# Patient Record
Sex: Male | Born: 1935 | Race: Black or African American | Hispanic: No | Marital: Married | Smoking: Never smoker
Health system: Southern US, Community
[De-identification: ages and names within clinical notes are randomized; demographics above are authoritative.]

## PROBLEM LIST (undated history)

## (undated) DIAGNOSIS — H269 Unspecified cataract: Secondary | ICD-10-CM

## (undated) DIAGNOSIS — K219 Gastro-esophageal reflux disease without esophagitis: Secondary | ICD-10-CM

## (undated) DIAGNOSIS — Z950 Presence of cardiac pacemaker: Secondary | ICD-10-CM

## (undated) DIAGNOSIS — R55 Syncope and collapse: Principal | ICD-10-CM

## (undated) DIAGNOSIS — H509 Unspecified strabismus: Secondary | ICD-10-CM

## (undated) DIAGNOSIS — M1990 Unspecified osteoarthritis, unspecified site: Secondary | ICD-10-CM

## (undated) DIAGNOSIS — I429 Cardiomyopathy, unspecified: Secondary | ICD-10-CM

## (undated) DIAGNOSIS — I10 Essential (primary) hypertension: Secondary | ICD-10-CM

## (undated) DIAGNOSIS — R001 Bradycardia, unspecified: Secondary | ICD-10-CM

## (undated) DIAGNOSIS — N400 Enlarged prostate without lower urinary tract symptoms: Secondary | ICD-10-CM

## (undated) DIAGNOSIS — I499 Cardiac arrhythmia, unspecified: Secondary | ICD-10-CM

## (undated) DIAGNOSIS — I8390 Asymptomatic varicose veins of unspecified lower extremity: Secondary | ICD-10-CM

## (undated) DIAGNOSIS — R7611 Nonspecific reaction to tuberculin skin test without active tuberculosis: Secondary | ICD-10-CM

## (undated) DIAGNOSIS — N189 Chronic kidney disease, unspecified: Secondary | ICD-10-CM

## (undated) HISTORY — PX: CATARACT EXTRACTION W/ INTRAOCULAR LENS IMPLANT: SHX1309

## (undated) HISTORY — PX: TONSILLECTOMY: SUR1361

---

## 1998-08-02 ENCOUNTER — Inpatient Hospital Stay (HOSPITAL_COMMUNITY): Admission: EM | Admit: 1998-08-02 | Discharge: 1998-08-03 | Payer: Self-pay | Admitting: Emergency Medicine

## 1998-08-02 ENCOUNTER — Encounter: Payer: Self-pay | Admitting: Emergency Medicine

## 1999-05-29 ENCOUNTER — Encounter: Payer: Self-pay | Admitting: Urology

## 1999-05-29 ENCOUNTER — Encounter: Admission: RE | Admit: 1999-05-29 | Discharge: 1999-05-29 | Payer: Self-pay | Admitting: Urology

## 1999-06-01 ENCOUNTER — Encounter: Payer: Self-pay | Admitting: Urology

## 1999-06-01 ENCOUNTER — Encounter: Admission: RE | Admit: 1999-06-01 | Discharge: 1999-06-01 | Payer: Self-pay | Admitting: Urology

## 2002-06-17 ENCOUNTER — Ambulatory Visit (HOSPITAL_COMMUNITY): Admission: RE | Admit: 2002-06-17 | Discharge: 2002-06-17 | Payer: Self-pay | Admitting: Gastroenterology

## 2002-06-17 ENCOUNTER — Encounter (INDEPENDENT_AMBULATORY_CARE_PROVIDER_SITE_OTHER): Payer: Self-pay | Admitting: Specialist

## 2004-07-30 HISTORY — PX: TRANSURETHRAL RESECTION OF PROSTATE: SHX73

## 2005-01-08 ENCOUNTER — Encounter (INDEPENDENT_AMBULATORY_CARE_PROVIDER_SITE_OTHER): Payer: Self-pay | Admitting: Specialist

## 2005-01-08 ENCOUNTER — Inpatient Hospital Stay (HOSPITAL_COMMUNITY): Admission: RE | Admit: 2005-01-08 | Discharge: 2005-01-10 | Payer: Self-pay | Admitting: Urology

## 2007-03-11 ENCOUNTER — Encounter: Admission: RE | Admit: 2007-03-11 | Discharge: 2007-03-11 | Payer: Self-pay | Admitting: Internal Medicine

## 2008-05-25 ENCOUNTER — Inpatient Hospital Stay (HOSPITAL_COMMUNITY): Admission: EM | Admit: 2008-05-25 | Discharge: 2008-05-27 | Payer: Self-pay | Admitting: Emergency Medicine

## 2009-11-25 ENCOUNTER — Emergency Department (HOSPITAL_COMMUNITY): Admission: EM | Admit: 2009-11-25 | Discharge: 2009-11-26 | Payer: Self-pay | Admitting: Emergency Medicine

## 2010-04-08 ENCOUNTER — Emergency Department (HOSPITAL_COMMUNITY): Admission: EM | Admit: 2010-04-08 | Discharge: 2010-04-08 | Payer: Self-pay | Admitting: Emergency Medicine

## 2010-10-12 LAB — BASIC METABOLIC PANEL
Chloride: 106 mEq/L (ref 96–112)
Creatinine, Ser: 1.92 mg/dL — ABNORMAL HIGH (ref 0.4–1.5)
GFR calc Af Amer: 41 mL/min — ABNORMAL LOW (ref >60)
GFR calc non Af Amer: 34 mL/min — ABNORMAL LOW (ref >60)
Sodium: 136 mEq/L (ref 135–145)

## 2010-10-12 LAB — CBC
HCT: 37.5 % — ABNORMAL LOW (ref 39.0–52.0)
Hemoglobin: 12 g/dL — ABNORMAL LOW (ref 13.0–17.0)
MCH: 27.5 pg (ref 26.0–34.0)
MCHC: 32 g/dL (ref 30.0–36.0)
MCV: 85.8 fL (ref 78.0–100.0)
Platelets: 188 K/uL (ref 150–400)
RBC: 4.37 MIL/uL (ref 4.22–5.81)
RDW: 12.6 % (ref 11.5–15.5)
WBC: 5.8 K/uL (ref 4.0–10.5)

## 2010-10-12 LAB — DIFFERENTIAL
Basophils Absolute: 0 K/uL (ref 0.0–0.1)
Basophils Relative: 1 % (ref 0–1)
Eosinophils Absolute: 0.2 K/uL (ref 0.0–0.7)
Eosinophils Relative: 3 % (ref 0–5)
Lymphocytes Relative: 15 % (ref 12–46)
Lymphs Abs: 0.9 K/uL (ref 0.7–4.0)
Monocytes Absolute: 0.6 K/uL (ref 0.1–1.0)
Monocytes Relative: 10 % (ref 3–12)
Neutro Abs: 4.2 10*3/uL (ref 1.7–7.7)
Neutrophils Relative %: 72 % (ref 43–77)

## 2010-10-12 LAB — BASIC METABOLIC PANEL WITH GFR
BUN: 23 mg/dL (ref 6–23)
CO2: 24 meq/L (ref 19–32)
Calcium: 8.7 mg/dL (ref 8.4–10.5)
Glucose, Bld: 94 mg/dL (ref 70–99)
Potassium: 5.1 meq/L (ref 3.5–5.1)

## 2010-10-12 LAB — POCT CARDIAC MARKERS
CKMB, poc: 2 ng/mL (ref 1.0–8.0)
Myoglobin, poc: 242 ng/mL (ref 12–200)
Troponin i, poc: <0.05 ng/mL (ref 0.00–0.09)

## 2010-10-17 LAB — COMPREHENSIVE METABOLIC PANEL
ALT: 20 U/L (ref 0–53)
Albumin: 3.9 g/dL (ref 3.5–5.2)
Alkaline Phosphatase: 53 U/L (ref 39–117)
Creatinine, Ser: 2.06 mg/dL — ABNORMAL HIGH (ref 0.4–1.5)
GFR calc non Af Amer: 31 mL/min — ABNORMAL LOW (ref >60)
Potassium: 4.5 mEq/L (ref 3.5–5.1)
Sodium: 138 mEq/L (ref 135–145)
Total Bilirubin: 0.7 mg/dL (ref 0.3–1.2)
Total Protein: 6.9 g/dL (ref 6.0–8.3)

## 2010-10-17 LAB — TROPONIN I: Troponin I: 0.01 ng/mL (ref 0.00–0.06)

## 2010-10-17 LAB — DIFFERENTIAL
Basophils Absolute: 0.1 10*3/uL (ref 0.0–0.1)
Lymphs Abs: 0.8 10*3/uL (ref 0.7–4.0)
Monocytes Relative: 8 % (ref 3–12)
Neutrophils Relative %: 79 % — ABNORMAL HIGH (ref 43–77)

## 2010-10-17 LAB — LIPASE, BLOOD: Lipase: 44 U/L (ref 11–59)

## 2010-10-17 LAB — CBC
RBC: 4.31 MIL/uL (ref 4.22–5.81)
WBC: 7.8 10*3/uL (ref 4.0–10.5)

## 2010-10-17 LAB — ETHANOL: Alcohol, Ethyl (B): <5 mg/dL (ref 0–10)

## 2010-12-12 NOTE — Discharge Summary (Signed)
NAMERYUU, LASPISA             ACCOUNT NO.:  [id number]   MEDICAL RECORD NO.:  [id number]          PATIENT TYPE:  INP   LOCATION:  [id number]                         FACILITY:  [facility]   PHYSICIAN:  Dwaine Deter, M.D.DATE OF BIRTH:  [birth year]   DATE OF ADMISSION:  05/25/2008  DATE OF DISCHARGE:  05/27/2008                               DISCHARGE SUMMARY   DISCHARGE DIAGNOSES:  1. Hypotension likely secondary to neurocardiogenic syncope or      vasovagal syncope.  2. Acute renal failure, resolving, likely secondary to dehydration and      hypotension.  3. Urethral stricture, relieved via dilatation procedure per Dr. Janice Norrie      on May 26, 2008.  4. Urinary retention likely secondary to #3.  5. Question of seizure activity, EEG pending.  Likely secondary to      hypotension and hypoperfusion.  Doubt seizure disorder.  Will      follow up on EEG.  6. Status post transurethral resection of the prostate.  7. For history of hypertension, hold Benicar hydrochlorothiazide for      now.   DISCHARGE MEDICATIONS:  Ceftin 500 mg b.i.d. for 7 days.   CONSULTATIONS:  Hanley Ben, M.D.   PROCEDURE:  Urethral dilatation and Foley placement, Dr. Janice Norrie, May 26, 2008.   HOSPITAL COURSE:  Mr. Taim Milia is a very pleasant 77 year old  male who became lightheaded had possible syncope with seizure-like  activity around the time of a funeral for his for and.  EMS was called  and he could not remember the event and felt somewhat nauseous and then  vomited.  Blood pressure was 80/60.  He was put in Trendelenburg and  given normal saline rapidly and blood pressure came up to 110/72 by the  time  he was evaluated in the [facility].  He was found  to have an elevated creatinine on admission at 2.83 and BUN was 36,  potassium slightly elevated at 5.8.  Attempts were made to place a Foley  catheter which were unsuccessful.   Again, the patient was admitted and seen by  Dr. Janice Norrie on the second day  of admission and after urethral dilatation, a Foley catheter was placed.   White count on admission was normal at 6800, hemoglobin was 12.3,  platelet count 225,000.   He has a history of TURP, but has had prostatosis/prostatitis in the  past and he was started on Rocephin on admission and switched over to  Ceftin 500 mg b.i.d. for 10 days at discharge.   With IV hydration, his acute renal failure was resolving by the time of  discharge with BUN down to 25 and creatinine down to 1.81.  Potassium  was normal at 4.6.  Blood cultures drawn x2 on May 26, 2008 were  negative for growth at 24 hours.  Because of anemia noted during the  hospitalization with a hemoglobin of 10.1 on May 27, 2008, hemoccult  of stool was performed which was negative.  Other laboratories while  admitted included a TSH normal at 0.621, hemoglobin A1c of 5.7%.  Lipid  profile  revealed a total cholesterol of 129, triglycerides 71, HDL 36  and LDL 79.  BNP on May 26, 2008 was 30.0. normal.   The patient's urinary catheter was removed on May 27, 2008 and he  was able to ambulate to the bathroom and urinate after that.  It was  felt that urinary/bladder obstruction was resolved with urethral  dilatation per Dr. Janice Norrie.   He will be discharged home not to start Benicar HCT for his hypertension  as yet.  Vital Signs: At discharge revealed a blood pressure 120/71,  respiratory rate 18 and nonlabored, pulse 92 and regular, temperature  98.1 at 6:00 a.m. on the day of discharge.  Orthostatic blood pressures  revealed no change in blood pressure, no drop in blood pressure from  lying to standing with systolic blood pressure rising from 120-136 from  lying to standing.  Pulse lying was 92, pulse standing was 78.   CONDITION ON DISCHARGE:  Improved.   The patient will be seen in 1 week for followup in my office.      Dwaine Deter, M.D.  Electronically Signed      RNG/MEDQ  D:  05/27/2008  T:  05/27/2008  Job:  [id number]   cc:   Hanley Ben, M.D.  Fax: [phone]

## 2010-12-12 NOTE — Consult Note (Signed)
Bradley [name], Bradley [name]             ACCOUNT NO.:  [id number]   MEDICAL RECORD NO.:  [id number]          PATIENT TYPE:  INP   LOCATION:  [id number]                         FACILITY:  [facility]   PHYSICIAN:  Hanley Ben, M.D.  DATE OF BIRTH:  1928-01-03   DATE OF CONSULTATION:  05/26/2008  DATE OF DISCHARGE:                                 CONSULTATION   REASON FOR CONSULTATION:  Foley insertion.   Mr. Ghafoor was admitted on October 27 with a history of lightheadedness  and seizure.  He does not have any recollection of those events.  He was  brought to the emergency room and was admitted for further evaluation.  He has a history of a urethral stricture and the nurses could not insert  a Foley catheter.  I was asked to see him for Foley catheter insertion.   PAST MEDICAL HISTORY:  Is positive for hypertension and urinary tract  infections.   SOCIAL HISTORY:  He does not smoke nor drink.  He is married.   FAMILY HISTORY:  His father and mother are deceased of unknown causes to  him.   REVIEW OF SYSTEMS:  Is as noted in the HPI.  He voids on his own.  He  has decreased force of urinary stream and no straining on urination.  He  has no hematuria.   PHYSICAL EXAMINATION:  His blood pressure is 128/71, pulse 99,  temperature 98.7, respirations 22.  His abdomen is soft, nondistended, nontender.  He has no CVA tenderness.  Penis and scrotal contents are within normal limits.   With his history of a urethral stricture, I dilated  the urethra with  Leander Rams sounds with #20-French.  Then I inserted a #16-French Foley  catheter in the bladder with a catheter guide.  Clear urine was drained  out of the bladder with a total of about 200 mL obtained.   PLAN:  Leave Foley catheter indwelling for now.  It can be removed when  the patient is stable.      Hanley Ben, M.D.  Electronically Signed     MN/MEDQ  D:  05/26/2008  T:  05/26/2008  Job:  [id number]   cc:   Dwaine Deter,  M.D.  Fax: [phone]

## 2010-12-12 NOTE — H&P (Signed)
NAMEPOE, Bradley [name]             ACCOUNT NO.:  [id number]   MEDICAL RECORD NO.:  [id number]          PATIENT TYPE:  EMS   LOCATION:  ED                           [facility]ILITY[facility]   PHYSICIAN:  Farris Has, MDDATE OF BIRTH:  Feb 04, 1928   DATE OF ADMISSION:  05/25/2008  DATE OF DISCHARGE:                              HISTORY & PHYSICAL   PRIMARY CARE PHYSICIAN:  Dwaine Deter, M.D.   CHIEF COMPLAINT:  Overall weakness.   HISTORY OF PRESENT ILLNESS:  The patient is a 77 year old gentleman, who  was at the funeral of his friend today when he was feeling all of a  sudden lightheaded and unfortunately, nobody was present at the time.  This actually occurred at the bedside, but per the patient, he was told  that he was having seizure like activity.  Although, he cannot recollect  any of this.  He was feeling somewhat nauseous, though overall just  weak,  and EMS was called.  Per EMS note, the patient did vomit, blood  pressure was 80/60.  The patient was put in Trendelenburg and given 500  mL bolus and blood pressure temperature came up, and by the time he got  to ED it was 110/72, and now has been in the 120s.  Otherwise, no other  history was provided by the patient.   REVIEW OF SYSTEMS:  He does state that he had been losing some weight  over some time now, and overall has been feeling poorly.  For a couple  of days, he had been coughing, but no fevers or chills.  No urinary  discomfort.  Otherwise, he is unsure of any other things that are  bothering him.  No chest pain.  No shortness of breath.  No wheezing.  No abdominal pain.  No nausea, vomiting or constipation.   SOCIAL HISTORY:  The patient never smokes or drinks.  Does not use  drugs.  Lives at home by himself.   FAMILY HISTORY:  Noncontributory.   PAST MEDICAL HISTORY:  1. BPH.  2. Hypertension.  3. The patient reported status post TURP.   PHYSICAL EXAMINATION:  VITAL SIGNS:  Temperature 97.8, blood  pressure  122/50, pulse 68, respirations 16, sating 100% on 2 liters.  GENERAL:  The patient appears to be in no acute distress.  Thin male.  There is some temporal wasting noted, but somewhat obese abdomen.  NECK:  Supple.  No lymphadenopathy noted.  LUNGS:  A lot of bronchial sounds, occasional crackles at the bases.  Overall poor air movement.  HEART:  Heart sounds but no murmurs appreciated.  ABDOMEN:  Slightly obese, but soft, nontender, nondistended.  No  splenomegaly or hepatomegaly noted.  LOWER EXTREMITIES:  Without clubbing, cyanosis or edema.  Strength 5/5  in all four extremities.  Cranial nerves II-XII intact.  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   LABORATORY DATA:  White blood cell count 6.8, hemoglobin 12.3, platelets  226.  Sodium 140, potassium 5.8, creatinine 2.83.  LFTs within  normal  limits.  BUN showing white blood cell count 3-6 small leukocyte  esterase.   EKG:  Showing heart rate of 73, occasional PVCs, sinus rhythm.  Otherwise, a slight peaking of T-waves noted in leads V4-V5.  Otherwise,  EKG unremarkable.  No imaging studies were obtained.   ASSESSMENT/PLAN:  This is a 77 year old gentleman, with hypertension,  weight loss and renal failure, possibly acute as the patient denies any  history of renal insufficiency that he knows of.  1. Hypertension etiology unclear.  The patient has had decreased p.o.      intake, could be hypovolemic, especially given elevated creatinine.      Will give IV fluids and monitor given that he has slight urinary      tract infection.  Will start on Rocephin.  Currently, the patient      is normotensive.  Will check orthostatics.  Monitor on telemetry.      Cycle cardiac enzymes, although has not had any chest pain,      shortness of breath, but to rule out cardiac etiology of acute MI      is a potential for hypertension.  We will further risk stratify       fasting lipid panel, hemoglobin A1c and TSH in a.m.  2. Renal failure.  Wonder if this is acute.  Will give IV fluids.      Check bladder scan and renal ultrasound to rule out obstruction.      If needed, will place Foley.  May need to have follow-up with      Urology if there is evidence of obstruction.  We will also give IV      fluids.  3. Possible mild UTI.  Will treat with Rocephin.  4. Cough and abnormal lung exam.  Will get chest x-ray.  If evidence      of pneumonia, will treat.  5. Elevated potassium.  Will give Kayexalate plus calcium, as the      patient had slight T-waves and admit to tele and follow potassium      level.  6. History of hypertension.  Will hold BP medications for right now.      Hold ARB as the patient is currently in acute renal failure.  7. Debility.  Will have PT/OT evaluation.  8. Questionable seizure activity.  There was necessarily any witness.      The patient does not carry any signs of seizure activity.  No bit      marks on the tongue.  No bruises, but we will order EEG and CT scan      of the head.  9. Weight loss.  Will need further evaluation to assess for possible      cancers.  This could be done as an outpatient versus as inpatient      depending on how the patient does from now on.  10.Prophylaxis.  Lovenox and Protonix.      Farris Has, MD  Electronically Signed     AVD/MEDQ  D:  05/25/2008  T:  05/26/2008  Job:  [id number]   cc:   Dwaine Deter, M.D.  Fax: [phone]

## 2010-12-12 NOTE — Procedures (Signed)
EEG NUMBER:  [id number].   REQUESTING PHYSICIAN:  Farris Has, MD   CLINICAL HISTORY:  A 77 year old man admitted with a dehydration, renal  physician, UTI, questionable seizure activity.  EEG is for evaluation.  The patient describes awake and drowsy.  This portable EEG done without  photic stimulation or hyperventilation.   DESCRIPTION:  The dominant rhythm with tracing at a low-to-moderate  amplitude alpha rhythm of 10-11 Hz as well as 9-10 Hz which predominates  posteriorly, appears without abnormal asymmetry, attenuates with eye  opening and closing.  Low amplitude fast activity seen frontally and  centrally and appears without abnormal asymmetry.  No focal slowing was  noted and no epileptiform discharges were seen.  Drowsiness occurs  naturally towards end of the record, as evidenced by fragmentation of  the background and general attenuation of rhythms.  A diffuse low  amplitude theta rhythm state of approximately 7 Hz was seen in  drowsiness.  No abnormalities seen in drowsiness.  Stage II sleep was  not achieved.  Photic stimulation and hyperventilation were not  performed.  Single channel devoted EKG revealed a significant arrhythmia  throughout, with frequent complex and bigeminy, rate of approximately 72  beats minute.   CONCLUSIONS:  Normal study in awake and drowsy state.  Incidental note  is made of abnormal EKG tracing with frequent couplets and bigeminy.      Michael L. Doy Mince, M.D.  Electronically Signed     [id number]  D:  05/26/2008 17:12:12  T:  05/27/2008 05:38:47  Job #:  [id number]

## 2010-12-15 NOTE — Discharge Summary (Signed)
Bradley [name], GAMLIN             ACCOUNT NO.:  [id number]   MEDICAL RECORD NO.:  [id number]          PATIENT TYPE:  INP   LOCATION:  [id number]                         [facility]ACILITY:  [facility]   PHYSICIAN:  Hanley Ben, M.D.  DATE OF BIRTH:  [birth year]   DATE OF ADMISSION:  01/08/2005  DATE OF DISCHARGE:  01/10/2005                                 DISCHARGE SUMMARY   DISCHARGE DIAGNOSES:  Benign prostatic hypertrophy, bladder outlet  obstruction, urethral stricture.   PROCEDURE DONE:  Cystoscopy, urethral dilation, transurethral resection of  the prostate on January 08, 2005.   Patient is a 77 year old male who had been complaining of frequency,  urgency, hesitancy, decreased force of urinary stream for several years.  He  is also known to have urethral stricture and he has had urethral dilations  in the past.  Cystoscopy done showed heavily trabeculated bladder with  cellules and diverticula.  Patient is admitted now for TURP and at a later  date, Urolume prosthesis if he starts having difficulty voiding again.   PHYSICAL EXAMINATION:  VITAL SIGNS:  Blood pressure 170/74, pulse 66,  respirations 16, temperature 97.2.  HEENT:  Head is normal.  Pupils are equal and reactive to light and  accommodation.  Ears, nose, and throat within normal limits.  NECK:  Supple.  No cervical lymph nodes.  LUNGS:  Clear to percussion and auscultation.  HEART:  Regular rhythm.  ABDOMEN:  Soft and nondistended.  Nontender.  No CVA tenderness.  Kidneys  not palpable.  No hepatomegaly.  No splenomegaly.  Bowel sounds normal.  GENITALIA:  Penis is circumcised and meatus is normal.  Scrotum normal.  Cord and testicles are within normal limits.   Hemoglobin on admission was 13.2, hematocrit 40.4, WBC 5.3.  BUN 14,  creatinine 1.3, sodium 136, potassium 3.8.  PT and PTT within normal limits.  Chest x-ray showed no evidence of acute disease.  EKG showed possible left  atrial enlargement.   Patient had cystoscopy  and TURP on January 08, 2005.   Postoperative course was uneventful.  He remained afebrile.  He tolerated  his diet well.  On the second day postoperative the Foley catheter was  removed.  After removing the Foley he was voiding well.  His urine was  grossly clear.  He was then discharged home on Macrobid 100 mg twice a day,  Colace 100 mg twice a day.  He was instructed not to do any lifting,  straining, or driving until further advised.  He will be followed in the  office in about three weeks.   CONDITION ON DISCHARGE:  Improved.   DISCHARGE DIET:  Regular.       MN/MEDQ  D:  01/10/2005  T:  01/10/2005  Job:  [id number]

## 2010-12-15 NOTE — Op Note (Signed)
NAMEDEEJAY, GOODRIDGE             ACCOUNT NO.:  [id number]   MEDICAL RECORD NO.:  [id number]          PATIENT TYPE:  INP   LOCATION:  NA                           FACILITY:  [facility]   PHYSICIAN:  Hanley Ben, M.D.  DATE OF BIRTH:  Jan 05, 1928   DATE OF PROCEDURE:  01/08/2005  DATE OF DISCHARGE:                                 OPERATIVE REPORT   skip>   PREOPERATIVE DIAGNOSIS:  Bladder outlet obstruction secondary to benign  prostatic hypertrophy, urethral stricture.   POSTOPERATIVE DIAGNOSIS:  Bladder outlet obstruction secondary to benign  prostatic hypertrophy, urethral stricture.   PROCEDURE:  Cystoscopy, urethral dilation, TURP.   ATTENDING SURGEON:  Lowella Bandy, MD   RESIDENT SURGEON:  Dorie Rank, MD   ANESTHESIA:  General endotracheal anesthesia.   COMPLICATIONS:  None.   INDICATIONS FOR PROCEDURE:  Mr. Rensch is a 77 year old gentleman with a  past medical history significant for bladder outlet obstruction due to BPH  and an anterior urethral stricture. Past office cystoscopic evaluation  demonstrated these findings in addition to significant trabeculation and  diverticulum formation within the bladder itself. A long discussion was held  with Mr. Greenhagen concerning these issues and their positive role in his  lower urinary tract symptoms. Plan will be to perform urethral dilation and  at the same time perform transurethral resection of the prostate with plans  for returning in the future for placement of a urethral stent. Mr. Rothrock  understands these issues and has agreed to proceed with the procedure.   PROCEDURE IN DETAIL:  The patient was brought to the operating room and  following induction of general endotracheal anesthesia was placed in the  dorsal lithotomy position and prepped and draped in the usual sterile  fashion. A rigid cystoscope was subsequently introduced into the patient's  urethra and advanced to the level of the anterior urethral  stricture line  just distal to the external sphincter. A guidewire was subsequently passed  through the cystoscope and used to intubate the strictured area. The wire  was then passed all the way into the bladder. The rigid cystoscope was  subsequently removed and Hegar dilators were used to dilate the urethra up  to a size 28 [place]. The rigid cystoscope was subsequently advanced back to  the point of the stricture and easy passage of the rigid cystoscope was  confirmed. The cystoscope was subsequently removed and a 22 French  resectoscope was advanced through the patient's urethra near the bladder.  Upon entering the bladder, both right and left ureteral orifices were  identified and clear efflux of urine confirmed from each. Systematic  examination of the bladder mucosa subsequently revealed severe grade 3  trabeculation with multiple diverticula but no signs of stone, tumor or  other abnormality. The right and left ureteral orifices were marked using  the coagulation function of the resection loop marking the mucosa just  inferomedial to each ureteral orifice. The  #24 resection loop was subsequently used to begin resection of the prostate  after drawing the resectoscope back into the prostatic urethra. The median  lobe was first resected in  its entirety followed by the lateral lobes and  the anterior lobe. Once all prostate tissue had been resected down to the  level of verumontanum, hemostasis was obtained using the cautery function of  the resection loop. The resection loop was subsequently removed and all  prostate chips were irrigated out of the bladder using an Ellik. Several  small prostate chips were identified on reinspection the bladder and these  were carefully removed with the resection loop. The resectoscope was  subsequently removed and a 24 [place] three-way Foley catheter was placed to  straight drain. The patient was subsequently awakened and the case was  ended. The  patient tolerated the procedure well and there were no  complications. Please note that Dr. Lowella Bandy was present entire case and  participated in all aspects of the procedure.       JJP/MEDQ  D:  01/08/2005  T:  01/08/2005  Job:  [id number]

## 2010-12-15 NOTE — Op Note (Signed)
NAME:  Bradley [name], Bradley [name]                       ACCOUNT NO.:  [id number]   MEDICAL RECORD NO.:  [id number]                   PATIENT TYPE:  AMB   LOCATION:  ENDO                                 [facility]   PHYSICIAN:  Earle Gell, M.D.                DATE OF BIRTH:  May 29, 1928   DATE OF PROCEDURE:  06/17/2002  DATE OF DISCHARGE:                                 OPERATIVE REPORT   PROCEDURE:  Colonoscopy and polypectomy.   INDICATIONS FOR PROCEDURE:  Bradley [name] is a 77 year old male born  1928-06-26. Bradley [name] underwent his health maintenance flexible  proctosigmoidoscopy performed by Dr. Mertha Finders and a small adenomatous  polyp was present in the distal sigmoid colon.   I discussed with Bradley [name] the complications associated with colonoscopy  and polypectomy including a 15/1000 risk of bleeding and 07/998 risk of  colon perforation requiring surgical repair. Bradley [name] has signed the  operative permit.   ENDOSCOPIST:  Garlan Fair, M.D.   PREMEDICATION:  Versed 5 mg, Demerol 50 mg.   ENDOSCOPE:  Olympus pediatric colonoscope.   DESCRIPTION OF PROCEDURE:  After obtaining informed consent, Bradley [name] was  placed in the left lateral decubitus position. I administered intravenous  Demerol and intravenous Versed to achieve conscious sedation for the  procedure. The patient's blood pressure, oxygen saturation and cardiac  rhythm were monitored throughout the procedure and documented in the medical  record.   Anal inspection was normal. Digital rectal exam revealed an enlarged  prostate. The Olympus pediatric video colonoscope was introduced into the  rectum and advanced to the cecum. Colonic preparation for the exam today was  satisfactory.   RECTUM:  Normal.   SIGMOID COLON AND DESCENDING COLON:  At 25 cm from the anal verge, a 2 mm  sessile polyp was removed with the electrocautery snare. At 60-70 cm from  the anal verge, two 3 mm  sessile polyps were removed with the electrocautery  snare and a 5 mm pedunculated polyp was removed with the electrocautery  snare. All polyps were submitted in one bottle for pathological evaluation.  There are a few diverticula in the left colon.   SPLENIC FLEXURE:  Normal.   TRANSVERSE COLON:  Normal.   HEPATIC FLEXURE:  Normal.   ASCENDING COLON:  Normal.   CECUM AND ILEOCECAL VALVE:  Normal.   ASSESSMENT:  Four small polyps were removed from the left colon with the  electrocautery snare and submitted for pathological interpretation. A few  diverticula are noted in the left colon.                                                Earle Gell, M.D.    MJ/MEDQ  D:  06/17/2002  T:  06/17/2002  Job:  [id number]   cc:   Dwaine Deter, M.D.  [address]  [place] [address]  Fax: [phone]

## 2010-12-15 NOTE — H&P (Signed)
NAME:  Bradley [name], Bradley [name]             ACCOUNT NO.:  [id number]   MEDICAL RECORD NO.:  [id number]          PATIENT TYPE:  INP   LOCATION:  NA                           FACILITY:  [facility]   PHYSICIAN:  Hanley Ben, M.D.  DATE OF BIRTH:  11/11/1927   DATE OF ADMISSION:  DATE OF DISCHARGE:                                HISTORY & PHYSICAL   CHIEF COMPLAINT:  Frequency, urgency, hesitancy, decreased force of the  urinary stream.   HISTORY OF PRESENT ILLNESS:  The patient is a 77 year old male who has been  having increasing symptoms of frequency, urgency, hesitancy, and decreased  force of urinary stream for several years. He is known to have urethral  stricture and has had urethral dilations in the past. He is also known to  have a BPH and a heavily trabeculated bladder. The patient had been  previously advised to have TURP and insertion of a UroLume prosthesis.  However, he had not made up his mind. He came back to the office last week  still complaining of the same symptoms. It was difficult to dilate his  urethra. It was dilated up to #18 [place] with some difficulty. He was then  advised to have TURP and insertion of UroLume prosthesis later on after the  TURP. He is agreeable at this time. He is now scheduled for the procedure.   PAST MEDICAL HISTORY:  Negative for diabetes or hypertension.   MEDICATIONS:  He takes aspirin as needed.   ALLERGIES:  No known drug allergies.   PAST SURGICAL HISTORY:  None.   SOCIAL HISTORY:  He is married, has two children. Does not smoke nor drink.   FAMILY HISTORY:  His father died of pneumonia in his 16s. His mother died at  age 31 and he has one brother.   REVIEW OF SYSTEMS:  He complains of frequency, decreased force of urinary  stream, straining on urination, and everything else is negative.   PHYSICAL EXAMINATION:  GENERAL:  This is a well-built 77 year old male in no  acute distress.  VITAL SIGNS:  Blood pressure is 170/74, pulse 66,  respirations 16,  temperature 97.2.  HEENT:  His head is normal. Pupils are equal and reactive to light and  accommodation. Ears, nose, and throat within normal limits.  NECK:  Supple. No cervical lymph nodes, no thyromegaly.  CHEST:  Symmetrical.  LUNGS:  Fully expanded and clear to percussion and auscultation.  HEART:  Regular rhythm. No murmur, no gallops.  ABDOMEN:  Soft and nondistended, nontender. He has no CVA tenderness.  Kidneys are not palpable. He has no hepatomegaly, no splenomegaly, no  inguinal hernia. Bowel sounds normal.  GENITALIA:  Penis is circumcised. Meatus is normal. Scrotum is normal. He  has no hydrocele, no testicular mass.  Cords and epididymides are within normal limits.  RECTAL:  Sphincter tone is normal, prostate is enlarged, 40 g, no nodules.  Seminal vesicles not palpable.   IMPRESSION:  Benign prostatic hypertrophy, bladder outlet obstruction,  urethral stricture.       MN/MEDQ  D:  01/08/2005  T:  01/08/2005  Job:  [id number]

## 2011-04-30 LAB — CARDIAC PANEL(CRET KIN+CKTOT+MB+TROPI)
Relative Index: 0.8
Total CK: 172
Total CK: 342 — ABNORMAL HIGH
Troponin I: 0.01
Troponin I: <0.01
Troponin I: <0.01

## 2011-04-30 LAB — URINALYSIS, ROUTINE W REFLEX MICROSCOPIC
Glucose, UA: NEGATIVE
Nitrite: NEGATIVE
Protein, ur: NEGATIVE
Specific Gravity, Urine: 1.018

## 2011-04-30 LAB — LIPID PANEL
HDL: 36 — ABNORMAL LOW
Total CHOL/HDL Ratio: 3.6
VLDL: 14

## 2011-04-30 LAB — PROTEIN ELECTROPHORESIS, SERUM
Albumin ELP: 57.5
Beta Globulin: 6.4
M-Spike, %: NOT DETECTED
Total Protein ELP: 5.7 — ABNORMAL LOW

## 2011-04-30 LAB — BASIC METABOLIC PANEL
Calcium: 8.5
Calcium: 8.6
Creatinine, Ser: 1.81 — ABNORMAL HIGH
Creatinine, Ser: 2.47 — ABNORMAL HIGH
GFR calc Af Amer: 44 — ABNORMAL LOW
Sodium: 138

## 2011-04-30 LAB — DIFFERENTIAL
Basophils Absolute: 0
Lymphocytes Relative: 9 — ABNORMAL LOW
Lymphs Abs: 0.6 — ABNORMAL LOW
Monocytes Absolute: 0.4
Monocytes Relative: 5
Neutrophils Relative %: 85 — ABNORMAL HIGH

## 2011-04-30 LAB — COMPREHENSIVE METABOLIC PANEL
BUN: 36 — ABNORMAL HIGH
BUN: 36 — ABNORMAL HIGH
CO2: 22
CO2: 23
Calcium: 8.3 — ABNORMAL LOW
Calcium: 9.1
Chloride: 112
Creatinine, Ser: 2.24 — ABNORMAL HIGH
Creatinine, Ser: 2.83 — ABNORMAL HIGH
GFR calc Af Amer: 34 — ABNORMAL LOW
GFR calc non Af Amer: 28 — ABNORMAL LOW
Glucose, Bld: 112 — ABNORMAL HIGH
Total Bilirubin: 0.7
Total Bilirubin: 0.8

## 2011-04-30 LAB — CBC
HCT: 30.9 — ABNORMAL LOW
Hemoglobin: 12.3 — ABNORMAL LOW
MCHC: 32.8
MCHC: 32.8
MCV: 87.8
Platelets: 225
RBC: 3.42 — ABNORMAL LOW
RBC: 3.52 — ABNORMAL LOW
WBC: 11 — ABNORMAL HIGH
WBC: 7.1

## 2011-04-30 LAB — URINE MICROSCOPIC-ADD ON

## 2011-04-30 LAB — TROPONIN I: Troponin I: 0.01

## 2011-04-30 LAB — UIFE/LIGHT CHAINS/TP QN, 24-HR UR
Albumin, U: DETECTED
Free Kappa/Lambda Ratio: 10.8 — ABNORMAL HIGH (ref 0.46–4.00)
Free Lambda Excretion/Day: 15.86
Free Lambda Lt Chains,Ur: 0.61 (ref 0.08–1.01)
Gamma Globulin, Urine: DETECTED — AB
Time: 24
Total Protein, Urine-Ur/day: 244 — ABNORMAL HIGH (ref 10–140)
Volume, Urine: 2600

## 2011-04-30 LAB — CK TOTAL AND CKMB (NOT AT ARMC)
CK, MB: 1.6
Relative Index: 1.3
Total CK: 124

## 2011-04-30 LAB — POCT CARDIAC MARKERS
CKMB, poc: 1
Myoglobin, poc: 279
Troponin i, poc: <0.05

## 2011-04-30 LAB — HEMOGLOBIN A1C
Hgb A1c MFr Bld: 5.7
Mean Plasma Glucose: 117

## 2011-04-30 LAB — GLUCOSE, CAPILLARY: Glucose-Capillary: 87

## 2011-04-30 LAB — B-NATRIURETIC PEPTIDE (CONVERTED LAB): Pro B Natriuretic peptide (BNP): 30

## 2011-04-30 LAB — CULTURE, BLOOD (ROUTINE X 2)

## 2011-04-30 LAB — OCCULT BLOOD X 1 CARD TO LAB, STOOL: Fecal Occult Bld: NEGATIVE

## 2011-04-30 LAB — PROTIME-INR
INR: 1.2
Prothrombin Time: 15.5 — ABNORMAL HIGH

## 2011-09-12 ENCOUNTER — Emergency Department (HOSPITAL_COMMUNITY): Payer: Medicare Other

## 2011-09-12 ENCOUNTER — Encounter (HOSPITAL_COMMUNITY): Payer: Self-pay | Admitting: Emergency Medicine

## 2011-09-12 ENCOUNTER — Other Ambulatory Visit: Payer: Self-pay

## 2011-09-12 ENCOUNTER — Inpatient Hospital Stay (HOSPITAL_COMMUNITY)
Admission: EM | Admit: 2011-09-12 | Discharge: 2011-09-13 | DRG: 690 | Disposition: A | Discharge status: Home / Self Care | Payer: Medicare Other | Attending: Internal Medicine | Admitting: Internal Medicine

## 2011-09-12 DIAGNOSIS — N400 Enlarged prostate without lower urinary tract symptoms: Secondary | ICD-10-CM | POA: Diagnosis present

## 2011-09-12 DIAGNOSIS — N390 Urinary tract infection, site not specified: Secondary | ICD-10-CM | POA: Diagnosis present

## 2011-09-12 DIAGNOSIS — K219 Gastro-esophageal reflux disease without esophagitis: Secondary | ICD-10-CM | POA: Diagnosis present

## 2011-09-12 DIAGNOSIS — M1710 Unilateral primary osteoarthritis, unspecified knee: Secondary | ICD-10-CM | POA: Diagnosis present

## 2011-09-12 DIAGNOSIS — E860 Dehydration: Secondary | ICD-10-CM | POA: Diagnosis present

## 2011-09-12 DIAGNOSIS — I10 Essential (primary) hypertension: Secondary | ICD-10-CM | POA: Diagnosis present

## 2011-09-12 DIAGNOSIS — M170 Bilateral primary osteoarthritis of knee: Secondary | ICD-10-CM | POA: Diagnosis present

## 2011-09-12 DIAGNOSIS — N35919 Unspecified urethral stricture, male, unspecified site: Secondary | ICD-10-CM | POA: Diagnosis present

## 2011-09-12 DIAGNOSIS — R55 Syncope and collapse: Secondary | ICD-10-CM

## 2011-09-12 HISTORY — DX: Essential (primary) hypertension: I10

## 2011-09-12 LAB — COMPREHENSIVE METABOLIC PANEL
ALT: 21 U/L (ref 0–53)
AST: 18 U/L (ref 0–37)
Albumin: 3.6 g/dL (ref 3.5–5.2)
Alkaline Phosphatase: 70 U/L (ref 39–117)
BUN: 26 mg/dL — ABNORMAL HIGH (ref 6–23)
Chloride: 103 mEq/L (ref 96–112)
Potassium: 4.4 mEq/L (ref 3.5–5.1)
Sodium: 137 mEq/L (ref 135–145)
Total Bilirubin: 0.4 mg/dL (ref 0.3–1.2)
Total Protein: 7.3 g/dL (ref 6.0–8.3)

## 2011-09-12 LAB — URINALYSIS, ROUTINE W REFLEX MICROSCOPIC
Bilirubin Urine: NEGATIVE
Glucose, UA: NEGATIVE mg/dL
Specific Gravity, Urine: 1.01 (ref 1.005–1.030)
pH: 6.5 (ref 5.0–8.0)

## 2011-09-12 LAB — GLUCOSE, CAPILLARY: Glucose-Capillary: 104 mg/dL — ABNORMAL HIGH (ref 70–99)

## 2011-09-12 LAB — CARDIAC PANEL(CRET KIN+CKTOT+MB+TROPI)
CK, MB: 2.9 ng/mL (ref 0.3–4.0)
Relative Index: INVALID (ref 0.0–2.5)
Total CK: 94 U/L (ref 7–232)
Troponin I: <0.3 ng/mL (ref <0.30)

## 2011-09-12 LAB — POCT I-STAT TROPONIN I: Troponin i, poc: 0.01 ng/mL (ref 0.00–0.08)

## 2011-09-12 LAB — CBC
Hemoglobin: 12.1 g/dL — ABNORMAL LOW (ref 13.0–17.0)
MCHC: 32.3 g/dL (ref 30.0–36.0)
Platelets: 194 10*3/uL (ref 150–400)
RBC: 4.4 MIL/uL (ref 4.22–5.81)

## 2011-09-12 LAB — PROTIME-INR: Prothrombin Time: 13.6 seconds (ref 11.6–15.2)

## 2011-09-12 LAB — DIFFERENTIAL
Basophils Relative: 1 % (ref 0–1)
Lymphs Abs: 1 10*3/uL (ref 0.7–4.0)
Monocytes Relative: 6 % (ref 3–12)
Neutro Abs: 3.4 10*3/uL (ref 1.7–7.7)
Neutrophils Relative %: 71 % (ref 43–77)

## 2011-09-12 LAB — URINE MICROSCOPIC-ADD ON

## 2011-09-12 MED ORDER — ASPIRIN 81 MG PO CHEW
81.0000 mg | CHEWABLE_TABLET | Freq: Every day | ORAL | Status: DC
  Administered 2011-09-12 – 2011-09-13 (×2): 81 mg via ORAL
  Filled 2011-09-12 (×2): qty 1

## 2011-09-12 MED ORDER — ACETAMINOPHEN 325 MG PO TABS
650.0000 mg | ORAL_TABLET | Freq: Three times a day (TID) | ORAL | Status: DC | PRN
Start: 2011-09-12 — End: 2011-09-13
  Administered 2011-09-13: 650 mg via ORAL
  Filled 2011-09-12: qty 2

## 2011-09-12 MED ORDER — SODIUM CHLORIDE 0.9 % IV SOLN
INTRAVENOUS | Status: DC
  Administered 2011-09-12 – 2011-09-13 (×2): via INTRAVENOUS

## 2011-09-12 MED ORDER — SODIUM CHLORIDE 0.9 % IV BOLUS (SEPSIS)
1000.0000 mL | Freq: Once | INTRAVENOUS | Status: AC
  Administered 2011-09-12: 1000 mL via INTRAVENOUS

## 2011-09-12 MED ORDER — PANTOPRAZOLE SODIUM 40 MG PO TBEC
40.0000 mg | DELAYED_RELEASE_TABLET | Freq: Every day | ORAL | Status: DC
  Administered 2011-09-13: 40 mg via ORAL
  Filled 2011-09-12: qty 1

## 2011-09-12 MED ORDER — VITAMIN D3 25 MCG (1000 UNIT) PO TABS
3000.0000 [IU] | ORAL_TABLET | Freq: Every day | ORAL | Status: DC
  Administered 2011-09-12 – 2011-09-13 (×2): 3000 [IU] via ORAL
  Filled 2011-09-12 (×2): qty 3

## 2011-09-12 MED ORDER — METOPROLOL TARTRATE 25 MG/10 ML ORAL SUSPENSION
50.0000 mg | Freq: Two times a day (BID) | ORAL | Status: DC
  Administered 2011-09-12: 50 mg via ORAL
  Filled 2011-09-12 (×3): qty 20

## 2011-09-12 NOTE — ED Provider Notes (Signed)
History     CSN: [id number]  Arrival date & time 09/12/11  1128   First MD Initiated Contact with Patient 09/12/11 1150      Chief Complaint  Patient presents with  . Loss of Consciousness    (Consider location/radiation/quality/duration/timing/severity/associated sxs/prior treatment) HPI Patient is an 78 year old male who had witnessed syncope at a store today. He injured and to get something for breakfast for he and his wife and stopped at the [facility]. While waiting in line patient began to feel lightheaded. He then syncopized. Patient did not urinate or defecate on himself. He has no history of seizures. He has never passed out like this before. He denies any chest pain or shortness of breath. He denies any pain at this time. He says he does not feel quite back to himself but that he does feel improved from right after he woke. Fingerstick at bedside and was 108.There are no other associated or modifying factors.  Past Medical History  Diagnosis Date  . Hypertension     Past Surgical History  Procedure Date  . Transurethral resection of prostate     History reviewed. No pertinent family history.  History  Substance Use Topics  . Smoking status: Never Smoker   . Smokeless tobacco: Never Used  . Alcohol Use: No      Review of Systems  Constitutional: Negative.   HENT: Negative.   Eyes: Negative.   Respiratory: Negative.   Cardiovascular: Negative.   Gastrointestinal: Negative.   Genitourinary: Negative.   Musculoskeletal: Negative.   Skin: Negative.   Neurological: Positive for syncope.  Hematological: Negative.   Psychiatric/Behavioral: Negative.   All other systems reviewed and are negative.    Allergies  Review of patient's allergies indicates no known allergies.  Home Medications   Current Outpatient Rx  Name Route Sig Dispense Refill  . ACETAMINOPHEN ER 650 MG PO TBCR Oral Take 1,300 mg by mouth every 8 (eight) hours as needed. For arthritis  pain    . VITAMIN D 1000 UNITS PO TABS Oral Take 3,000 Units by mouth daily.     Marland Kitchen HYDROCHLOROTHIAZIDE 12.5 MG PO TABS Oral Take 12.5 mg by mouth daily.    Marland Kitchen NAPROXEN SODIUM 220 MG PO TABS Oral Take 440 mg by mouth 2 (two) times daily with a meal.    . OMEPRAZOLE 20 MG PO CPDR Oral Take 20 mg by mouth daily.      BP 172/96  Pulse 67  Temp 97.5 F (36.4 C)  Resp 14  SpO2 99%  Physical Exam  Nursing note and vitals reviewed. GEN: Well-developed, well-nourished male in no distress HEENT: Atraumatic, normocephalic. Oropharynx clear without erythema EYES: PERRLA BL, no scleral icterus. NECK: Trachea midline, no meningismus CV: regular rate and rhythm. No murmurs, rubs, or gallops PULM: No respiratory distress.  No crackles, wheezes, or rales. GI: soft, non-tender. No guarding, rebound, or tenderness. + bowel sounds  Neuro: cranial nerves 2-12 intact, no abnormalities of strength or sensation, A and O x 3 MSK: Patient moves all 4 extremities symmetrically, no deformity, edema, or injury noted Psych: no abnormality of mood   ED Course  Procedures (including critical care time)   Date: 09/12/2011  Rate: 56  Rhythm: normal sinus rhythm  QRS Axis: normal  Intervals: normal  ST/T Wave abnormalities: normal  Conduction Disutrbances:none  Narrative Interpretation:   Old EKG Reviewed: unchanged   Labs Reviewed  GLUCOSE, CAPILLARY - Abnormal; Notable for the following:    Glucose-Capillary 104 (*)  All other components within normal limits  CBC - Abnormal; Notable for the following:    Hemoglobin 12.1 (*)    HCT 37.5 (*)    All other components within normal limits  COMPREHENSIVE METABOLIC PANEL - Abnormal; Notable for the following:    Glucose, Bld 104 (*)    BUN 26 (*)    Creatinine, Ser 2.06 (*)    GFR calc non Af Amer 29 (*)    GFR calc Af Amer 33 (*)    All other components within normal limits  DIFFERENTIAL  PROTIME-INR  POCT I-STAT TROPONIN I  URINALYSIS, ROUTINE  W REFLEX MICROSCOPIC   Dg Chest 2 View  09/12/2011  *RADIOLOGY REPORT*  Clinical Data: Loss of consciousness.  CHEST - 2 VIEW  Comparison: 04/08/2010.  Findings: Trachea is midline.  Heart size normal.  Lungs are somewhat low in volume with minimal linear subsegmental atelectasis at the right lung base, adjacent to an elevated right hemidiaphragm.  No airspace consolidation or pleural fluid.  IMPRESSION: No acute findings.  Original Report Authenticated By: Luretha Rued, M.D.   Ct Head Wo Contrast  09/12/2011  *RADIOLOGY REPORT*  Clinical Data: Loss of consciousness, weakness.  CT HEAD WITHOUT CONTRAST  Technique:  Contiguous axial images were obtained from the base of the skull through the vertex without contrast.  Comparison: 04/08/2010  Findings: Chronic small vessel disease throughout the deep white matter, stable. No acute intracranial abnormality.  Specifically, no hemorrhage, hydrocephalus, mass lesion, acute infarction, or significant intracranial injury.  No acute calvarial abnormality.  Visualized right maxillary sinus is opacified.  Mastoids are clear.  IMPRESSION: Chronic small vessel disease.  No acute intracranial abnormality.  Original Report Authenticated By: Raelyn Number, M.D.     1. Syncope       MDM  Patient presents today with complaint of syncope. He does not have any chest pain at this time and states that he started to feel better. His glucose is within normal limits. Patient had workup for syncope performed. Patient's CBC was within normal limits. CT head and chest x-ray were unremarkable. As was EKG. Troponin also is negative. Patient was given a liter normal saline IV bolus. He did appear to have dehydration on his renal panel. Urinalysis was pending at the time of admission. Patient will be admitted for overnight observation given his episode of syncope. Patient is admitted to the hospitalist for further management.        Chauncy Passy, MD 09/12/11 [phone]

## 2011-09-12 NOTE — Plan of Care (Signed)
Problem: Phase I Progression Outcomes Goal: Initial discharge plan identified Outcome: Completed/Met Date Met:  09/12/11 Pt to return home with wife

## 2011-09-12 NOTE — ED Notes (Signed)
Per EMS, pt was at store, became weak and sat down, was witnessed by store employees to have syncopal episode. EMS was called and pt transported to ER, in transit pt vomited x 1. EMS reports pt HR was at one point down to 46.

## 2011-09-12 NOTE — ED Notes (Signed)
RN attempted to call report to floor, Floor nurse unavailable and will call this RN back

## 2011-09-12 NOTE — H&P (Signed)
PCP:  No primary provider on file. Chief Complaint:  "I almost passed out at the store and this occurred earlier hours of today."  HPI:  Patient is an 78 year old African American male with history of hypertension presenting to the emergency room with feeling of almost passing out at the store early hours of this morning. Patient claimed that he had been in stable health until about 8:30 this morning while he was at that convinient store, he felt lightheaded and was almost about to pass out. Patient had to seat down and subsequently EMS was called. He denied any history of chest pain or shortness of breath. He denied any palpitation. No nausea or vomiting.Marland Kitchen He also denied any history of fever chills or Rigors  Review of Systems:  The patient denies anorexia, fever, weight loss,, vision loss, decreased hearing, hoarseness, chest pain, syncope, dyspnea on exertion, peripheral edema,headache+ balance deficits, hemoptysis, abdominal pain, melena, hematochezia, severe indigestion/heartburn, hematuria, incontinence, genital sores, muscle weakness, suspicious skin lesions, transient blindness, difficulty walking, depression, unusual weight change, abnormal bleeding, enlarged lymph nodes, angioedema, and breast masses.  Past Medical History:  Past Medical History  Diagnosis Date  . Hypertension     Past Surgical History  Procedure Date  . Transurethral resection of prostate     Medications:  Prior to Admission medications   Medication Sig Start Date End Date Taking? Authorizing Provider  acetaminophen (TYLENOL ARTHRITIS PAIN) 650 MG CR tablet Take 1,300 mg by mouth every 8 (eight) hours as needed. For arthritis pain   Yes Historical Provider, MD  cholecalciferol (VITAMIN D) 1000 UNITS tablet Take 3,000 Units by mouth daily.    Yes Historical Provider, MD  hydrochlorothiazide (HYDRODIURIL) 12.5 MG tablet Take 12.5 mg by mouth daily.   Yes Historical Provider, MD  naproxen sodium (ANAPROX)  220 MG tablet Take 440 mg by mouth 2 (two) times daily with a meal.   Yes Historical Provider, MD  omeprazole (PRILOSEC) 20 MG capsule Take 20 mg by mouth daily.   Yes Historical Provider, MD    Allergies:  No Known Allergies  Social History:   reports that he has never smoked. He has never used smokeless tobacco. He reports that he does not drink alcohol or use illicit drugs.  Family History:  History reviewed. No pertinent family history.  Physical Exam:  Filed Vitals:   09/12/11 1132 09/12/11 1218 09/12/11 1437  BP: 149/75 149/80 172/96  Pulse: 59 58 67  Temp: 97.5 F (36.4 C)    Resp: 14    SpO2: 100% 100% 99%      General: Alert and oriented times three, not in any distress, suboptimal hydration  Eyes: PERRLA, questionable squint, pink conjunctiva, scleral anicterus  ENT: Dry oral mucosa, neck supple, no thyromegaly  Lungs: clear to ascultation, no wheeze, no crackles, no use of accessory muscles  Cardiovascular: regular rate and rhythm, no regurgitation, no gallops, no murmurs. No carotid bruits, no JVD  Abdomen: soft, positive BS, non-tender, non-distended, no organomegaly, not an acute abdomen  GU: not examined  Neuro: No lateralizing signs  Musculoskeletal: Arthritic changes in the knees and feet with bunion left foot  Skin: Decreased turgor  Psych: appropriate affect  ?  Labs on Admission:   [facility] 09/12/11 1230  NA 137  K 4.4  CL 103  CO2 24  GLUCOSE 104*  BUN 26*  CREATININE 2.06*  CALCIUM 9.2  MG --  PHOS --     Basename 09/12/11 1230  AST 18  ALT  21  ALKPHOS 70  BILITOT 0.4  PROT 7.3  ALBUMIN 3.6    No results found for this basename: LIPASE:2,AMYLASE:2 in the last 72 hours   Basename 09/12/11 1230  WBC 4.8  NEUTROABS 3.4  HGB 12.1*  HCT 37.5*  MCV 85.2  PLT 194    No results found for this basename: CKTOTAL:3,CKMB:3,CKMBINDEX:3,TROPONINI:3 in the last 72 hours  No results found for this basename:  TSH,T4TOTAL,FREET3,T3FREE,THYROIDAB in the last 72 hours  No results found for this basename: VITAMINB12:2,FOLATE:2,FERRITIN:2,TIBC:2,IRON:2,RETICCTPCT:2 in the last 72 hours  Radiological Exams on Admission:  Dg Chest 2 View  09/12/2011  *RADIOLOGY REPORT*  Clinical Data: Loss of consciousness.  CHEST - 2 VIEW  Comparison: 04/08/2010.  Findings: Trachea is midline.  Heart size normal.  Lungs are somewhat low in volume with minimal linear subsegmental atelectasis at the right lung base, adjacent to an elevated right hemidiaphragm.  No airspace consolidation or pleural fluid.  IMPRESSION: No acute findings.  Original Report Authenticated By: Luretha Rued, M.D.   Ct Head Wo Contrast  09/12/2011  *RADIOLOGY REPORT*  Clinical Data: Loss of consciousness, weakness.  CT HEAD WITHOUT CONTRAST  Technique:  Contiguous axial images were obtained from the base of the skull through the vertex without contrast.  Comparison: 04/08/2010  Findings: Chronic small vessel disease throughout the deep white matter, stable. No acute intracranial abnormality.  Specifically, no hemorrhage, hydrocephalus, mass lesion, acute infarction, or significant intracranial injury.  No acute calvarial abnormality.  Visualized right maxillary sinus is opacified.  Mastoids are clear.  IMPRESSION: Chronic small vessel disease.  No acute intracranial abnormality.  Original Report Authenticated By: Raelyn Number, M.D.    Assessment/Plan  Present on Admission:   Problems: #1 "almost passed out" #2 dehydration #3 renal insufficiency #4 questionable squint #5 bunion left foot  Impression #1 near-syncope #2 dehydration #3 questionable acute on chronic renal disease #4 hypertension #5 bunion left foot  Plan: #1 admit patient to telemetry #2 gentle IV rehydration with normal saline #3 discontinue hydrochlorothiazide #4 maintain blood pressure with lopressor #5 labs; cardiac enzyme every 8x3, CBC, CMP and magnesium  repeated in a.m. #6 imaging-2-D echo and carotid duplex Patient will be evaluated daily.                   Jackie Plum                           [phone]

## 2011-09-12 NOTE — ED Notes (Signed)
Pt returned from CT scan.

## 2011-09-12 NOTE — Progress Notes (Signed)
Noted no pcp listed spoke with pt and family who confirms pcp as Bradley [name].

## 2011-09-12 NOTE — ED Notes (Signed)
[id number] Expected date:09/12/11<BR> Expected time:11:04 AM<BR> Means of arrival:Ambulance<BR> Comments:<BR> syncope

## 2011-09-12 NOTE — ED Notes (Signed)
Patient transported to X-ray 

## 2011-09-13 DIAGNOSIS — I10 Essential (primary) hypertension: Secondary | ICD-10-CM | POA: Diagnosis present

## 2011-09-13 DIAGNOSIS — K219 Gastro-esophageal reflux disease without esophagitis: Secondary | ICD-10-CM | POA: Diagnosis present

## 2011-09-13 DIAGNOSIS — N400 Enlarged prostate without lower urinary tract symptoms: Secondary | ICD-10-CM | POA: Diagnosis present

## 2011-09-13 DIAGNOSIS — N35919 Unspecified urethral stricture, male, unspecified site: Secondary | ICD-10-CM | POA: Diagnosis present

## 2011-09-13 DIAGNOSIS — N390 Urinary tract infection, site not specified: Secondary | ICD-10-CM | POA: Diagnosis present

## 2011-09-13 DIAGNOSIS — M170 Bilateral primary osteoarthritis of knee: Secondary | ICD-10-CM | POA: Diagnosis present

## 2011-09-13 LAB — T4, FREE: Free T4: 1.23 ng/dL (ref 0.80–1.80)

## 2011-09-13 LAB — COMPREHENSIVE METABOLIC PANEL
ALT: 16 U/L (ref 0–53)
Albumin: 3 g/dL — ABNORMAL LOW (ref 3.5–5.2)
Alkaline Phosphatase: 58 U/L (ref 39–117)
BUN: 25 mg/dL — ABNORMAL HIGH (ref 6–23)
Chloride: 106 mEq/L (ref 96–112)
Glucose, Bld: 92 mg/dL (ref 70–99)
Potassium: 4.7 mEq/L (ref 3.5–5.1)
Sodium: 137 mEq/L (ref 135–145)
Total Bilirubin: 0.4 mg/dL (ref 0.3–1.2)

## 2011-09-13 LAB — CBC
Hemoglobin: 11 g/dL — ABNORMAL LOW (ref 13.0–17.0)
Platelets: 188 10*3/uL (ref 150–400)
RBC: 4.03 MIL/uL — ABNORMAL LOW (ref 4.22–5.81)

## 2011-09-13 LAB — CARDIAC PANEL(CRET KIN+CKTOT+MB+TROPI)
CK, MB: 3.3 ng/mL (ref 0.3–4.0)
Relative Index: 2.5 (ref 0.0–2.5)
Troponin I: <0.3 ng/mL (ref <0.30)
Troponin I: <0.3 ng/mL (ref <0.30)

## 2011-09-13 LAB — DIFFERENTIAL
Basophils Relative: 1 % (ref 0–1)
Lymphs Abs: 1.4 10*3/uL (ref 0.7–4.0)
Monocytes Relative: 10 % (ref 3–12)
Neutro Abs: 2.3 10*3/uL (ref 1.7–7.7)
Neutrophils Relative %: 52 % (ref 43–77)

## 2011-09-13 LAB — MAGNESIUM: Magnesium: 1.6 mg/dL (ref 1.5–2.5)

## 2011-09-13 MED ORDER — DEXTROSE 5 % IV SOLN
2.0000 g | Freq: Once | INTRAVENOUS | Status: AC
  Administered 2011-09-13: 2 g via INTRAVENOUS
  Filled 2011-09-13: qty 2

## 2011-09-13 MED ORDER — CIPROFLOXACIN HCL 500 MG PO TABS: 500.0000 mg | ORAL_TABLET | Freq: Two times a day (BID) | ORAL | Status: AC

## 2011-09-13 NOTE — Progress Notes (Signed)
Discharge patient to home wife and son at bedside. PIV removed no s/s of infiltration or no swelling noted. Discharge instructions and follow up appointment given to patient.

## 2011-09-13 NOTE — Discharge Summary (Signed)
Physician Discharge Summary  NAME:Bradley [name]  [id number]  DOB: March 18, 1928   Admit date: 09/12/2011 Discharge date: 09/13/2011  Discharge Diagnoses:  Active Problems:  Bacteriuria with pyuria - likely UTI. Rocephin will be given on the day of discharge and Cipro will be continued for one week as an outpatient  Near syncope - has history of vasovagal syncope or near syncope in the past secondary to GERD with spasm, micturition, and UTI  BPH (benign prostatic hyperplasia) - chronically followed by Dr. Janice Norrie  Hypertension  Urethral stricture  GERD (gastroesophageal reflux disease)  Osteoarthritis of both knees   Discharge Condition: Improved  Hospital Course: Bradley [name] is a very pleasant 78 year old male who I have followed for more than 10 years. He has a history of hypertension, GERD, neurocardiogenic syncope secondary to GERD, micturition, and UTI in the past. He also has a history of DJD of the knees and urethral stricture followed by Dr. Janice Norrie. Yesterday while shopping he had a near syncopal episode. He was brought to the [facility] ER and the only positive finding thus far is pyuria and bacteriuria. He feels well this morning and monitoring has revealed no significant arrhythmias. We'll provide 2 g of IV Rocephin x1 and plan to start ciprofloxacin as an outpatient and I will see him back in the office within one week.  Consults: Noncontributory  Disposition:  Discharge home in the presence of his wife  Filed Vitals:   09/12/11 1850 09/12/11 2017 09/12/11 2103 09/13/11 0543  BP: 197/96 135/67 145/72 155/73  Pulse:   49 55  Temp:   98.1 F (36.7 C) 98.2 F (36.8 C)  TempSrc:   Oral Oral  Resp:   16 17  Height:      Weight:      SpO2:   99% 97%    General: Alert and oriented times three, not in any distress Eyes: Strabismus, chronic on right  ENT: Moist l mucosa, neck supple, no thyromegaly Neck: No bruits Lungs: clear to ascultation, no wheeze, no crackles,  no use of accessory muscles  Cardiovascular: regular rate and rhythm, no regurgitation, no gallops, no murmurs. No carotid bruits, no JVD  Abdomen: soft, positive BS, non-tender, non-distended, no organomegaly, not an acute abdomen  GU: not examined  Neuro: No lateralizing signs  Musculoskeletal: Arthritic changes in the knees and feet with bunion left foot  Skin: Decreased turgor  Psych: appropriate affect   Discharge Orders    Future Orders Please Complete By Expires   Diet - low sodium heart healthy      Increase activity slowly      Discharge instructions      Scheduling Instructions:   Call [phone] or [phone] for appointment in 1-2 weeks   Comments:   Make sure to pick up prescription for ciprofloxacin which you will be taking twice daily for 7 days. Stay well hydrated and notify M.D. if you're not feeling well   Call MD for:  temperature >100.4      Call MD for:  difficulty breathing, headache or visual disturbances      Call MD for:  persistant dizziness or light-headedness        Medication List  As of 09/13/2011  6:47 AM   TAKE these medications         cholecalciferol 1000 UNITS tablet   Commonly known as: VITAMIN D   Take 3,000 Units by mouth daily.      ciprofloxacin 500 MG tablet   Commonly  known as: CIPRO   Take 1 tablet (500 mg total) by mouth 2 (two) times daily.      hydrochlorothiazide 12.5 MG tablet   Commonly known as: HYDRODIURIL   Take 12.5 mg by mouth daily.      naproxen sodium 220 MG tablet   Commonly known as: ANAPROX   Take 440 mg by mouth 2 (two) times daily with a meal.      omeprazole 20 MG capsule   Commonly known as: PRILOSEC   Take 20 mg by mouth daily.      TYLENOL ARTHRITIS PAIN 650 MG CR tablet   Generic drug: acetaminophen   Take 1,300 mg by mouth every 8 (eight) hours as needed. For arthritis pain           Follow-up Information    Follow up with Pericles Carmicheal NEVILL, MD .         Things to follow up in the outpatient  setting: Lightheadedness or dizziness or fevers or chills. Need to followup on urine culture  Time coordinating discharge: 25 minutes  The results of significant diagnostics from this hospitalization (including imaging, microbiology, ancillary and laboratory) are listed below for reference.    Significant Diagnostic Studies: Dg Chest 2 View  09/12/2011  *RADIOLOGY REPORT*  Clinical Data: Loss of consciousness.  CHEST - 2 VIEW  Comparison: 04/08/2010.  Findings: Trachea is midline.  Heart size normal.  Lungs are somewhat low in volume with minimal linear subsegmental atelectasis at the right lung base, adjacent to an elevated right hemidiaphragm.  No airspace consolidation or pleural fluid.  IMPRESSION: No acute findings.  Original Report Authenticated By: Luretha Rued, M.D.   Ct Head Wo Contrast  09/12/2011  *RADIOLOGY REPORT*  Clinical Data: Loss of consciousness, weakness.  CT HEAD WITHOUT CONTRAST  Technique:  Contiguous axial images were obtained from the base of the skull through the vertex without contrast.  Comparison: 04/08/2010  Findings: Chronic small vessel disease throughout the deep white matter, stable. No acute intracranial abnormality.  Specifically, no hemorrhage, hydrocephalus, mass lesion, acute infarction, or significant intracranial injury.  No acute calvarial abnormality.  Visualized right maxillary sinus is opacified.  Mastoids are clear.  IMPRESSION: Chronic small vessel disease.  No acute intracranial abnormality.  Original Report Authenticated By: Raelyn Number, M.D.    Microbiology: No results found for this or any previous visit (from the past 240 hour(s)).   Labs: Results for orders placed during the hospital encounter of 09/12/11  GLUCOSE, CAPILLARY      Component Value Range   Glucose-Capillary 104 (*) 70 - 99 (mg/dL)   Comment 1 Documented in Chart     Comment 2 Notify RN    CBC      Component Value Range   WBC 4.8  4.0 - 10.5 (K/uL)   RBC 4.40  4.22 -  5.81 (MIL/uL)   Hemoglobin 12.1 (*) 13.0 - 17.0 (g/dL)   HCT 37.5 (*) 39.0 - 52.0 (%)   MCV 85.2  78.0 - 100.0 (fL)   MCH 27.5  26.0 - 34.0 (pg)   MCHC 32.3  30.0 - 36.0 (g/dL)   RDW 12.6  11.5 - 15.5 (%)   Platelets 194  150 - 400 (K/uL)  DIFFERENTIAL      Component Value Range   Neutrophils Relative 71  43 - 77 (%)   Neutro Abs 3.4  1.7 - 7.7 (K/uL)   Lymphocytes Relative 20  12 - 46 (%)   Lymphs Abs  1.0  0.7 - 4.0 (K/uL)   Monocytes Relative 6  3 - 12 (%)   Monocytes Absolute 0.3  0.1 - 1.0 (K/uL)   Eosinophils Relative 2  0 - 5 (%)   Eosinophils Absolute 0.1  0.0 - 0.7 (K/uL)   Basophils Relative 1  0 - 1 (%)   Basophils Absolute 0.0  0.0 - 0.1 (K/uL)  COMPREHENSIVE METABOLIC PANEL      Component Value Range   Sodium 137  135 - 145 (mEq/L)   Potassium 4.4  3.5 - 5.1 (mEq/L)   Chloride 103  96 - 112 (mEq/L)   CO2 24  19 - 32 (mEq/L)   Glucose, Bld 104 (*) 70 - 99 (mg/dL)   BUN 26 (*) 6 - 23 (mg/dL)   Creatinine, Ser 2.06 (*) 0.50 - 1.35 (mg/dL)   Calcium 9.2  8.4 - 10.5 (mg/dL)   Total Protein 7.3  6.0 - 8.3 (g/dL)   Albumin 3.6  3.5 - 5.2 (g/dL)   AST 18  0 - 37 (U/L)   ALT 21  0 - 53 (U/L)   Alkaline Phosphatase 70  39 - 117 (U/L)   Total Bilirubin 0.4  0.3 - 1.2 (mg/dL)   GFR calc non Af Amer 29 (*) >90 (mL/min)   GFR calc Af Amer 33 (*) >90 (mL/min)  PROTIME-INR      Component Value Range   Prothrombin Time 13.6  11.6 - 15.2 (seconds)   INR 1.02  0.00 - 1.49   URINALYSIS, ROUTINE W REFLEX MICROSCOPIC      Component Value Range   Color, Urine YELLOW  YELLOW    APPearance CLOUDY (*) CLEAR    Specific Gravity, Urine 1.010  1.005 - 1.030    pH 6.5  5.0 - 8.0    Glucose, UA NEGATIVE  NEGATIVE (mg/dL)   Hgb urine dipstick LARGE (*) NEGATIVE    Bilirubin Urine NEGATIVE  NEGATIVE    Ketones, ur NEGATIVE  NEGATIVE (mg/dL)   Protein, ur NEGATIVE  NEGATIVE (mg/dL)   Urobilinogen, UA 0.2  0.0 - 1.0 (mg/dL)   Nitrite NEGATIVE  NEGATIVE    Leukocytes, UA LARGE (*)  NEGATIVE   POCT I-STAT TROPONIN I      Component Value Range   Troponin i, poc 0.01  0.00 - 0.08 (ng/mL)   Comment 3           CARDIAC PANEL(CRET KIN+CKTOT+MB+TROPI)      Component Value Range   Total CK 94  7 - 232 (U/L)   CK, MB 2.9  0.3 - 4.0 (ng/mL)   Troponin I <0.30  <0.30 (ng/mL)   Relative Index RELATIVE INDEX IS INVALID  0.0 - 2.5   CARDIAC PANEL(CRET KIN+CKTOT+MB+TROPI)      Component Value Range   Total CK 110  7 - 232 (U/L)   CK, MB 3.1  0.3 - 4.0 (ng/mL)   Troponin I <0.30  <0.30 (ng/mL)   Relative Index 2.8 (*) 0.0 - 2.5   TSH      Component Value Range   TSH 1.770  0.350 - 4.500 (uIU/mL)  T3, FREE      Component Value Range   T3, Free 2.4  2.3 - 4.2 (pg/mL)  T4, FREE      Component Value Range   Free T4 1.23  0.80 - 1.80 (ng/dL)  URINE MICROSCOPIC-ADD ON      Component Value Range   Squamous Epithelial / LPF FEW (*) RARE  WBC, UA 11-20  <3 (WBC/hpf)   RBC / HPF 7-10  <3 (RBC/hpf)   Bacteria, UA MANY (*) RARE    Urine-Other RARE YEAST       Signed: Shalla Bulluck NEVILL 09/13/2011, 6:47 AM

## 2011-09-13 NOTE — Progress Notes (Signed)
UR completed 

## 2011-09-15 LAB — URINE CULTURE: Culture  Setup Time: 201302140832

## 2012-10-15 ENCOUNTER — Encounter (HOSPITAL_COMMUNITY): Payer: Self-pay | Admitting: Emergency Medicine

## 2012-10-15 ENCOUNTER — Observation Stay (HOSPITAL_COMMUNITY): Payer: Medicare Other

## 2012-10-15 ENCOUNTER — Emergency Department (HOSPITAL_COMMUNITY): Payer: Medicare Other

## 2012-10-15 ENCOUNTER — Observation Stay (HOSPITAL_COMMUNITY)
Admission: EM | Admit: 2012-10-15 | Discharge: 2012-10-17 | Disposition: A | Discharge status: Home / Self Care | Payer: Medicare Other | Attending: Internal Medicine | Admitting: Internal Medicine

## 2012-10-15 DIAGNOSIS — K219 Gastro-esophageal reflux disease without esophagitis: Secondary | ICD-10-CM | POA: Diagnosis present

## 2012-10-15 DIAGNOSIS — I10 Essential (primary) hypertension: Secondary | ICD-10-CM | POA: Diagnosis present

## 2012-10-15 DIAGNOSIS — N390 Urinary tract infection, site not specified: Secondary | ICD-10-CM | POA: Diagnosis present

## 2012-10-15 DIAGNOSIS — H538 Other visual disturbances: Secondary | ICD-10-CM | POA: Insufficient documentation

## 2012-10-15 DIAGNOSIS — G319 Degenerative disease of nervous system, unspecified: Secondary | ICD-10-CM | POA: Insufficient documentation

## 2012-10-15 DIAGNOSIS — N183 Chronic kidney disease, stage 3 unspecified: Secondary | ICD-10-CM | POA: Diagnosis present

## 2012-10-15 DIAGNOSIS — R55 Syncope and collapse: Principal | ICD-10-CM | POA: Insufficient documentation

## 2012-10-15 DIAGNOSIS — R4789 Other speech disturbances: Secondary | ICD-10-CM | POA: Insufficient documentation

## 2012-10-15 DIAGNOSIS — I6529 Occlusion and stenosis of unspecified carotid artery: Secondary | ICD-10-CM | POA: Insufficient documentation

## 2012-10-15 DIAGNOSIS — R8271 Bacteriuria: Secondary | ICD-10-CM

## 2012-10-15 DIAGNOSIS — I658 Occlusion and stenosis of other precerebral arteries: Secondary | ICD-10-CM | POA: Insufficient documentation

## 2012-10-15 HISTORY — DX: Unspecified cataract: H26.9

## 2012-10-15 HISTORY — DX: Gastro-esophageal reflux disease without esophagitis: K21.9

## 2012-10-15 HISTORY — DX: Unspecified osteoarthritis, unspecified site: M19.90

## 2012-10-15 HISTORY — DX: Syncope and collapse: R55

## 2012-10-15 LAB — URINALYSIS, ROUTINE W REFLEX MICROSCOPIC
Bilirubin Urine: NEGATIVE
Glucose, UA: NEGATIVE mg/dL
Ketones, ur: NEGATIVE mg/dL
Nitrite: NEGATIVE
Protein, ur: NEGATIVE mg/dL
Specific Gravity, Urine: 1.013 (ref 1.005–1.030)
Urobilinogen, UA: 0.2 mg/dL (ref 0.0–1.0)
pH: 5.5 (ref 5.0–8.0)

## 2012-10-15 LAB — CBC WITH DIFFERENTIAL/PLATELET
Basophils Absolute: 0 10*3/uL (ref 0.0–0.1)
Basophils Relative: 1 % (ref 0–1)
Eosinophils Absolute: 0.1 10*3/uL (ref 0.0–0.7)
Eosinophils Relative: 3 % (ref 0–5)
HCT: 34.4 % — ABNORMAL LOW (ref 39.0–52.0)
Hemoglobin: 10.9 g/dL — ABNORMAL LOW (ref 13.0–17.0)
Lymphocytes Relative: 24 % (ref 12–46)
Lymphs Abs: 0.9 10*3/uL (ref 0.7–4.0)
MCH: 27.3 pg (ref 26.0–34.0)
MCHC: 31.7 g/dL (ref 30.0–36.0)
MCV: 86 fL (ref 78.0–100.0)
Monocytes Absolute: 0.4 10*3/uL (ref 0.1–1.0)
Monocytes Relative: 10 % (ref 3–12)
Neutro Abs: 2.5 10*3/uL (ref 1.7–7.7)
Neutrophils Relative %: 63 % (ref 43–77)
Platelets: 193 10*3/uL (ref 150–400)
RBC: 4 MIL/uL — ABNORMAL LOW (ref 4.22–5.81)
RDW: 12.4 % (ref 11.5–15.5)
WBC: 4 10*3/uL (ref 4.0–10.5)

## 2012-10-15 LAB — TROPONIN I: Troponin I: <0.3 ng/mL (ref <0.30)

## 2012-10-15 LAB — BASIC METABOLIC PANEL
BUN: 24 mg/dL — ABNORMAL HIGH (ref 6–23)
CO2: 24 mEq/L (ref 19–32)
Calcium: 8.5 mg/dL (ref 8.4–10.5)
Chloride: 103 mEq/L (ref 96–112)
Creatinine, Ser: 2.12 mg/dL — ABNORMAL HIGH (ref 0.50–1.35)
GFR calc Af Amer: 32 mL/min — ABNORMAL LOW (ref >90)
GFR calc non Af Amer: 27 mL/min — ABNORMAL LOW (ref >90)
Glucose, Bld: 98 mg/dL (ref 70–99)
Potassium: 4.2 mEq/L (ref 3.5–5.1)
Sodium: 137 mEq/L (ref 135–145)

## 2012-10-15 LAB — CBC
Platelets: 214 10*3/uL (ref 150–400)
RBC: 4 MIL/uL — ABNORMAL LOW (ref 4.22–5.81)
WBC: 5.9 10*3/uL (ref 4.0–10.5)

## 2012-10-15 LAB — GLUCOSE, CAPILLARY: Glucose-Capillary: 111 mg/dL — ABNORMAL HIGH (ref 70–99)

## 2012-10-15 LAB — URINE MICROSCOPIC-ADD ON

## 2012-10-15 LAB — CREATININE, SERUM
Creatinine, Ser: 2.05 mg/dL — ABNORMAL HIGH (ref 0.50–1.35)
GFR calc Af Amer: 33 mL/min — ABNORMAL LOW (ref >90)
GFR calc non Af Amer: 29 mL/min — ABNORMAL LOW (ref >90)

## 2012-10-15 MED ORDER — PANTOPRAZOLE SODIUM 40 MG PO TBEC
40.0000 mg | DELAYED_RELEASE_TABLET | Freq: Every day | ORAL | Status: DC
  Administered 2012-10-15 – 2012-10-17 (×3): 40 mg via ORAL
  Filled 2012-10-15 (×3): qty 1

## 2012-10-15 MED ORDER — DEXTROSE 5 % IV SOLN
1.0000 g | INTRAVENOUS | Status: DC
  Administered 2012-10-15: 1 g via INTRAVENOUS
  Filled 2012-10-15 (×2): qty 10

## 2012-10-15 MED ORDER — ENOXAPARIN SODIUM 40 MG/0.4ML ~~LOC~~ SOLN
40.0000 mg | SUBCUTANEOUS | Status: DC
  Administered 2012-10-15 – 2012-10-16 (×2): 40 mg via SUBCUTANEOUS
  Filled 2012-10-15 (×3): qty 0.4

## 2012-10-15 MED ORDER — DIFLUPREDNATE 0.05 % OP EMUL
1.0000 [drp] | Freq: Four times a day (QID) | OPHTHALMIC | Status: DC
  Administered 2012-10-15: 1 [drp] via OPHTHALMIC

## 2012-10-15 MED ORDER — ASPIRIN 325 MG PO TABS
325.0000 mg | ORAL_TABLET | Freq: Every day | ORAL | Status: DC
  Administered 2012-10-15 – 2012-10-17 (×3): 325 mg via ORAL
  Filled 2012-10-15 (×3): qty 1

## 2012-10-15 MED ORDER — NEPAFENAC 0.3 % OP SUSP: 1.0000 [drp] | Freq: Every day | OPHTHALMIC | Status: DC

## 2012-10-15 MED ORDER — SODIUM CHLORIDE 0.9 % IV SOLN
INTRAVENOUS | Status: DC
  Administered 2012-10-15 – 2012-10-16 (×2): via INTRAVENOUS

## 2012-10-15 MED ORDER — ACETAMINOPHEN 325 MG PO TABS: 650.0000 mg | ORAL_TABLET | ORAL | Status: DC | PRN

## 2012-10-15 MED ORDER — HYDRALAZINE HCL 20 MG/ML IJ SOLN: 10.0000 mg | Freq: Four times a day (QID) | INTRAMUSCULAR | Status: DC | PRN

## 2012-10-15 NOTE — ED Notes (Signed)
Patient returned from xray.

## 2012-10-15 NOTE — ED Notes (Signed)
Per EMS, patient was walking around this morning getting ready to go out.   Patient got dizzy and "sat down in chair".  Patient denies passing out.  Patient states he just "got weak and dizzy a little bit, but feels better now".   "I was going to a retirement breakfast this morning, but I didn't get to go".

## 2012-10-15 NOTE — H&P (Signed)
[facility] History and Physical  RIC CRAFTS [id number] DOB: 27-Sep-1927 DOA: 10/15/2012  Referring physician: Dr. Wyvonnia Dusky PCP: Henrine Screws, MD  Specialists: Urologist: Dr. Janice Norrie  Chief Complaint: near syncope  HPI: Bradley [name] is a 79 y.o. male who presents to the Ed with complaints of dizziness.  Patient was in his usual state of health when this morning, his wife noted that he was moving slower than he normally does. He began to fell dizzy, and his wife caught him before he fell. He denies any chest pain or shortness of breath.  His wife feels that he may have been transiently slurring his speech.  He also feels that he was having difficulty with his vision and had increased blurry vision.  He recently had cataract surgery on his right eye, but feels his vision transiently got worse.  Ambulance was called and by the time EMS arrived on scene, he reports resolution of his symptoms.  He was monitored in the ED and was referred for observation.  He has not had any recent fever, cough, dysuria, diarrhea or vomiting.  His PO intake has been normal for him.  Review of Systems: Pertinent positives as per HPI, otherwise negative.  Past Medical History  Diagnosis Date  . Hypertension   . Cataract   . Syncope and collapse 10/15/2012  . Pneumonia 1980's?  Marland Kitchen GERD (gastroesophageal reflux disease)   . Arthritis     "knees" (10/15/2012)   Past Surgical History  Procedure Laterality Date  . Transurethral resection of prostate  2000's  . Cataract extraction w/ intraocular lens implant Right   . Tonsillectomy      "I was young" (10/15/2012)   Social History:  reports that he has never smoked. He has never used smokeless tobacco. He reports that he does not drink alcohol or use illicit drugs. Lives at home with his wife, ambulates with cane.  No Known Allergies  Family History:  Mother died of congestive heart failure, father died when the patient was 42 years old,  possibly of pneumonia.  Prior to Admission medications   Medication Sig Start Date End Date Taking? Authorizing Provider  acetaminophen (TYLENOL ARTHRITIS PAIN) 650 MG CR tablet Take 1,300 mg by mouth every 8 (eight) hours as needed for pain.    Yes Historical Provider, MD  cholecalciferol (VITAMIN D) 1000 UNITS tablet Take 3,000 Units by mouth daily.    Yes Historical Provider, MD  Difluprednate (DUREZOL) 0.05 % EMUL Place 1 drop into the right eye 4 (four) times daily.   Yes Historical Provider, MD  hydrochlorothiazide (HYDRODIURIL) 12.5 MG tablet Take 12.5 mg by mouth daily.   Yes Historical Provider, MD  Nepafenac (ILEVRO) 0.3 % SUSP Place 1 drop into the right eye daily.   Yes Historical Provider, MD  omeprazole (PRILOSEC) 20 MG capsule Take 20 mg by mouth daily.   Yes Historical Provider, MD   Physical Exam: Filed Vitals:   10/15/12 1355 10/15/12 1400 10/15/12 1415 10/15/12 1456  BP: 138/78 145/75 145/78 170/77  Pulse: 73 67 64 61  Temp:    97.6 F (36.4 C)  TempSrc:    Oral  Resp: 21 20 19 18   Height:    6\' 2"  (1.88 m)  Weight:    93.486 kg (206 lb 1.6 oz)  SpO2: 100% 98% 100% 98%     General:  NAD  Eyes: PERRLA,   ENT: mucous membranes are moist  Neck: supple  Cardiovascular: S1, S2, RRR  Respiratory: CTA  B  Abdomen: soft, nt, bs+  Skin: deferred  Musculoskeletal: deferred  Psychiatric: normal affect, co operative with exam  Neurologic: grossly intact, non focal, strength equal bilaterally, no facial asymmetry  Labs on Admission:  Basic Metabolic Panel:  Recent Labs Lab 10/15/12 1024  NA 137  K 4.2  CL 103  CO2 24  GLUCOSE 98  BUN 24*  CREATININE 2.12*  CALCIUM 8.5   Liver Function Tests: No results found for this basename: AST, ALT, ALKPHOS, BILITOT, PROT, ALBUMIN,  in the last 168 hours No results found for this basename: LIPASE, AMYLASE,  in the last 168 hours No results found for this basename: AMMONIA,  in the last 168  hours CBC:  Recent Labs Lab 10/15/12 1024  WBC 4.0  NEUTROABS 2.5  HGB 10.9*  HCT 34.4*  MCV 86.0  PLT 193   Cardiac Enzymes:  Recent Labs Lab 10/15/12 1026  TROPONINI <0.30    BNP (last 3 results) No results found for this basename: PROBNP,  in the last 8760 hours CBG: No results found for this basename: GLUCAP,  in the last 168 hours  Radiological Exams on Admission: Dg Chest 2 View  10/15/2012  *RADIOLOGY REPORT*  Clinical Data: Near-syncope  CHEST - 2 VIEW  Comparison: 09/12/2011  Findings: Lungs are under aerated and grossly clear. Cardiomediastinal silhouette is within normal limits.  No pneumothorax and no pleural effusion.  IMPRESSION: No active cardiopulmonary disease.   Original Report Authenticated By: Marybelle Killings, M.D.     EKG: Independently reviewed. Normal sinus rhythm, no acute changes.  Assessment/Plan Active Problems:   Near syncope   Hypertension   GERD (gastroesophageal reflux disease)   1. Near syncope.  At this point, the differential is wide.  With the transient slurring of speech and vision disturbances, concern arises for TIA.  Patient will be admitted overnight for observation on telemetry to monitor for any arrythmias that could be causing his symptoms.  We will obtain MRI of the brain, carotid dopplers and echo.  He will be continued on aspirin and lipid panel and a1c will be checked.  Other etiologies include UTI and urinalysis is currently pending.  He does not appear to be volume depleted, making orthostasis less likely. Will continue current treatments and observe. Cycle cardiac markers. 2. Hypertension.  Use prn hydralazine for now.  Would avoid betablocker due to relative bradycardia, ACE due to CKD. 3. CKD3.  Creatinine appears to be near baseline, will follow. 4. Urethral stricture.  Follow up with Dr. Janice Norrie 5. Anemia, likely due to chronic kidney disease, no evidence of bleeding. Hgb near baseline, continue to follow.  Patient will be  admitted to the service of Dr. Inda Merlin who will assume care of the patient at [place] on 10/16/12.  Until then, [facility] will be responsible for the patient.   Code Status: full code Family Communication: discussed with wife at the bedside Disposition Plan: possible discharge home tomorrow if improved  Time spent: 76mins  MEMON,JEHANZEB [facility] Pager [phone]  If 7PM-7AM, please contact night-coverage [email or web address] Password [email or web address] 10/15/2012, 5:13 PM   Addendum:   UA positive for leukocytes and bacteria.  Will check urine culture and start rocephin.

## 2012-10-15 NOTE — ED Notes (Signed)
Patient transported to X-ray 

## 2012-10-15 NOTE — ED Notes (Signed)
Pt aware that we need urine specimen. Unable to go at this time

## 2012-10-15 NOTE — ED Provider Notes (Signed)
History     CSN: [id number]  Arrival date & time 10/15/12  [id number]   First MD Initiated Contact with Patient 10/15/12 [phone]      Chief Complaint  Patient presents with  . Near Syncope    (Consider location/radiation/quality/duration/timing/severity/associated sxs/prior treatment) HPI Bradley [name] is an 79 year old male with past history significant for HTN who presents to the ED after near syncope. He reports feeling a little "off" this morning while getting ready to go out for breakfast. He was sitting down and putting his tie on and when he stood up he got lightheaded and fell. He reports having had some blurry vision at the time of the fall. He does report having cataracts, and had surgery on his right eye one week ago. He felt some general weakness, but denies unilateral weakness and aphasia. He denies LOC, head trauma, palpitations, chest pain, nausea, vomiting, convulsions, tongue biting, and confusion. His wife witnessed the fall and reports he was talking and denies LOC or seizure activity. He denies any new medications or dehydration.  Past Medical History  Diagnosis Date  . Hypertension   . Cataract     Past Surgical History  Procedure Laterality Date  . Transurethral resection of prostate    . Cataract extraction      No family history on file.  History  Substance Use Topics  . Smoking status: Never Smoker   . Smokeless tobacco: Never Used  . Alcohol Use: No      Review of Systems All other systems negative except as documented in the HPI. All pertinent positives and negatives as reviewed in the HPI.  Allergies  Review of patient's allergies indicates no known allergies.  Home Medications   Current Outpatient Rx  Name  Route  Sig  Dispense  Refill  . acetaminophen (TYLENOL ARTHRITIS PAIN) 650 MG CR tablet   Oral   Take 1,300 mg by mouth every 8 (eight) hours as needed for pain.          . cholecalciferol (VITAMIN D) 1000 UNITS tablet   Oral   Take  3,000 Units by mouth daily.          . Difluprednate (DUREZOL) 0.05 % EMUL   Right Eye   Place 1 drop into the right eye 4 (four) times daily.         . hydrochlorothiazide (HYDRODIURIL) 12.5 MG tablet   Oral   Take 12.5 mg by mouth daily.         . Nepafenac (ILEVRO) 0.3 % SUSP   Right Eye   Place 1 drop into the right eye daily.         Marland Kitchen omeprazole (PRILOSEC) 20 MG capsule   Oral   Take 20 mg by mouth daily.           BP 143/81  Pulse 62  Temp(Src) 98.1 F (36.7 C) (Oral)  Resp 12  Ht 6\' 1"  (1.854 m)  Wt 220 lb (99.791 kg)  BMI 29.03 kg/m2  SpO2 100%  Physical Exam  Constitutional: He is oriented to person, place, and time. He appears well-developed and well-nourished.  HENT:  Head: Normocephalic and atraumatic.  Eyes: EOM are normal. Pupils are equal, round, and reactive to light.  Inferolateral deviation of left eye.  Neck: Normal range of motion. Neck supple.  Cardiovascular: Normal rate, normal heart sounds and intact distal pulses.  Exam reveals no gallop and no friction rub.   No murmur heard. Irregular rhythm  Pulmonary/Chest: Effort normal and breath sounds normal.  Abdominal: Soft. Bowel sounds are normal. There is no tenderness.  Neurological: He is alert and oriented to person, place, and time. He has normal strength. No cranial nerve deficit or sensory deficit. He exhibits normal muscle tone. Coordination and gait normal. GCS eye subscore is 4. GCS verbal subscore is 5. GCS motor subscore is 6.  Skin: Skin is warm and dry. No rash noted. No erythema.    ED Course  Procedures (including critical care time)  Labs Reviewed  CBC WITH DIFFERENTIAL - Abnormal; Notable for the following:    RBC 4.00 (*)    Hemoglobin 10.9 (*)    HCT 34.4 (*)    All other components within normal limits  BASIC METABOLIC PANEL  URINALYSIS, ROUTINE W REFLEX MICROSCOPIC  TROPONIN I  The patient will be admitted to the hospital for observation of near syncope and  weakness.    MDM  MDM Reviewed: nursing note and vitals Interpretation: labs and x-ray Consults: admitting MD            Brent General, PA-C 10/17/12 [phone]

## 2012-10-16 DIAGNOSIS — R82998 Other abnormal findings in urine: Secondary | ICD-10-CM

## 2012-10-16 DIAGNOSIS — I359 Nonrheumatic aortic valve disorder, unspecified: Secondary | ICD-10-CM

## 2012-10-16 DIAGNOSIS — I10 Essential (primary) hypertension: Secondary | ICD-10-CM

## 2012-10-16 DIAGNOSIS — R55 Syncope and collapse: Secondary | ICD-10-CM

## 2012-10-16 LAB — LIPID PANEL
HDL: 39 mg/dL — ABNORMAL LOW (ref >39)
LDL Cholesterol: 101 mg/dL — ABNORMAL HIGH (ref 0–99)
Triglycerides: 110 mg/dL (ref <150)

## 2012-10-16 LAB — HEMOGLOBIN A1C
Hgb A1c MFr Bld: 5.8 % — ABNORMAL HIGH (ref <5.7)
Mean Plasma Glucose: 120 mg/dL — ABNORMAL HIGH (ref <117)

## 2012-10-16 LAB — RAPID URINE DRUG SCREEN, HOSP PERFORMED
Amphetamines: NOT DETECTED
Barbiturates: NOT DETECTED
Tetrahydrocannabinol: NOT DETECTED

## 2012-10-16 LAB — GLUCOSE, CAPILLARY

## 2012-10-16 MED ORDER — AMLODIPINE BESYLATE 5 MG PO TABS
5.0000 mg | ORAL_TABLET | Freq: Every day | ORAL | Status: DC
  Administered 2012-10-16 – 2012-10-17 (×2): 5 mg via ORAL
  Filled 2012-10-16 (×2): qty 1

## 2012-10-16 MED ORDER — DIFLUPREDNATE 0.05 % OP EMUL
1.0000 [drp] | Freq: Four times a day (QID) | OPHTHALMIC | Status: DC
  Administered 2012-10-16 – 2012-10-17 (×2): 1 [drp] via OPHTHALMIC

## 2012-10-16 MED ORDER — NEPAFENAC 0.3 % OP SUSP
1.0000 [drp] | Freq: Every day | OPHTHALMIC | Status: DC
  Administered 2012-10-17: 1 [drp] via OPHTHALMIC

## 2012-10-16 MED ORDER — CEFUROXIME AXETIL 250 MG PO TABS
250.0000 mg | ORAL_TABLET | Freq: Two times a day (BID) | ORAL | Status: DC
  Administered 2012-10-16 – 2012-10-17 (×2): 250 mg via ORAL
  Filled 2012-10-16 (×4): qty 1

## 2012-10-16 NOTE — Progress Notes (Signed)
  Echocardiogram 2D Echocardiogram has been performed.  Ardelle Balls A 10/16/2012, 11:01 AM

## 2012-10-16 NOTE — Progress Notes (Signed)
Subjective: Bradley [name] had an episode yesterday when he became very weak, had some slurred speech and had a near syncopal episode. Due these symptoms EMS was called and he was brought to [facility] ED. Evaluation revealed a UTI and his neurologic symptoms had cleared.  This AM he is awake and alert, talkative and able to relate the events of yesterday. He is in no distress.  Objective: Lab: Lab Results  Component Value Date   WBC 5.9 10/15/2012   HGB 11.2* 10/15/2012   HCT 34.3* 10/15/2012   MCV 85.8 10/15/2012   PLT 214 10/15/2012   BMET    Component Value Date/Time   NA 137 10/15/2012 1024   K 4.2 10/15/2012 1024   CL 103 10/15/2012 1024   CO2 24 10/15/2012 1024   GLUCOSE 98 10/15/2012 1024   BUN 24* 10/15/2012 1024   CREATININE 2.05* 10/15/2012 1745   CALCIUM 8.5 10/15/2012 1024   GFRNONAA 29* 10/15/2012 1745   GFRAA 33* 10/15/2012 1745   U/A - positive with WBC TNTC and bacteria  Imaging:  Scheduled Meds: . aspirin  325 mg Oral Daily  . cefTRIAXone (ROCEPHIN)  IV  1 g Intravenous Q24H  . Difluprednate  1 drop Right Eye QID  . enoxaparin (LOVENOX) injection  40 mg Subcutaneous Q24H  . Nepafenac  1 drop Right Eye Daily  . pantoprazole  40 mg Oral Daily   Continuous Infusions: . sodium chloride 100 mL/hr at 10/16/12 0506   PRN Meds:.acetaminophen, hydrALAZINE   Physical Exam: Filed Vitals:   10/16/12 0400  BP: 174/73  Pulse: 69  Temp: 97.8 F (36.6 C)  Resp: 18   Gen'l- Elderly AA man in no distress HEENT - lazy eye - disconjugate gaze (a chronic problem) Cor - ver irregular heart rate: Tele: PVCs, p-waves are present, sinus arrythmia Pul - normal respirations Abd - overweight, BS+, Soft      Assessment/Plan: 1. Syncope - per Dr. Inda Merlin the patient has had near syncopal episodes in the past associated with infections. Reading on MRI/MRA pending. He has a non-foacl exam today.  Plan  continue to monitor tele\  Await read on imaging.  2. ID - patient with UTI Day #2  rocephin. NO fever and normal WBC  Change to ceftin today.  3. HTN - BP is running a bit high today.   PLan  Will add amlodipine 5 mg   4. CKD III - reviewed labs back to '13 - chronic Creatinine around 2.0.  5. Anemia - stable  6. Ophthalmology - he reports that he has an appointment tomorrow with Dr. Katy Fitch for pre-operative visit.    Bradley [name] IM (o) [phone]; (c) [place]: [phone]  10/16/2012, 8:06 AM

## 2012-10-17 DIAGNOSIS — G459 Transient cerebral ischemic attack, unspecified: Secondary | ICD-10-CM

## 2012-10-17 LAB — URINE CULTURE: Colony Count: >=100000

## 2012-10-17 MED ORDER — ASPIRIN 325 MG PO TABS
325.0000 mg | ORAL_TABLET | Freq: Every day | ORAL | Status: DC
Start: 2012-10-17 — End: 2012-12-05

## 2012-10-17 MED ORDER — CEFUROXIME AXETIL 250 MG PO TABS
250.0000 mg | ORAL_TABLET | Freq: Two times a day (BID) | ORAL | Status: DC
Start: 2012-10-17 — End: 2012-11-17

## 2012-10-17 NOTE — Progress Notes (Signed)
Subjective: Feeling good and back to his normal self  Objective: Lab: Lab Results  Component Value Date   WBC 5.9 10/15/2012   HGB 11.2* 10/15/2012   HCT 34.3* 10/15/2012   MCV 85.8 10/15/2012   PLT 214 10/15/2012   BMET    Component Value Date/Time   NA 137 10/15/2012 1024   K 4.2 10/15/2012 1024   CL 103 10/15/2012 1024   CO2 24 10/15/2012 1024   GLUCOSE 98 10/15/2012 1024   BUN 24* 10/15/2012 1024   CREATININE 2.05* 10/15/2012 1745   CALCIUM 8.5 10/15/2012 1024   GFRNONAA 29* 10/15/2012 1745   GFRAA 33* 10/15/2012 1745     Imaging:  Scheduled Meds: . amLODipine  5 mg Oral Daily  . aspirin  325 mg Oral Daily  . cefUROXime  250 mg Oral BID WC  . Difluprednate  1 drop Right Eye QID  . enoxaparin (LOVENOX) injection  40 mg Subcutaneous Q24H  . Nepafenac  1 drop Right Eye Daily  . pantoprazole  40 mg Oral Daily   Continuous Infusions: . sodium chloride 100 mL/hr at 10/16/12 1800   PRN Meds:.acetaminophen, hydrALAZINE   Physical Exam: Filed Vitals:   10/17/12 0700  BP: 153/88  Pulse: 81  Temp: 98.2 F (36.8 C)  Resp: 18    See d/c dictation    Assessment/Plan: For d/c home Dictated [phone] F/u - to call Dr. Inda Merlin office for follow up appointment  Adella Hare [place] [facility] (o) [phone]; (c) [place]: [phone]  10/17/2012, 8:51 AM

## 2012-10-17 NOTE — ED Provider Notes (Signed)
Medical screening examination/treatment/procedure(s) were conducted as a shared visit with non-physician practitioner(s) and myself.  I personally evaluated the patient during the encounter  Near syncope this morning with lightheadedness. No CP or SOB.  T wave changes of lateral leads. Not orthostatic. Question TIA, possible cardiogenic syncope.   Ezequiel Essex, MD 10/17/12 [phone]

## 2012-10-17 NOTE — Progress Notes (Addendum)
VASCULAR LAB PRELIMINARY  PRELIMINARY  PRELIMINARY  PRELIMINARY  Carotid duplex completed.    Preliminary report:  Right:  No evidence of hemodynamically significant internal carotid artery stenosis. Left -  There is a 40% to 59% proximal ICA stenosis lowest end of range Bilateral vertebral artery flow is antegrade.   Jandiel Magallanes, RVS 10/17/2012, 10:06 AM

## 2012-10-18 NOTE — Discharge Summary (Signed)
NAMEHIRVING, ELLENBECKER NO.:  [id number]  MEDICAL RECORD NO.:  [id number]  LOCATION:  [id number]                        FACILITY:  [facility]  PHYSICIAN:  Heinz Knuckles. Shirleyann Montero, MD  DATE OF BIRTH:  1928-01-10  DATE OF ADMISSION:  10/15/2012 DATE OF DISCHARGE:  10/17/2012                              DISCHARGE SUMMARY   ADMITTING DIAGNOSES: 1. Near syncope. 2. Hypertension. 3. Chronic kidney disease, stage III. 4. Urethral stricture. 5. Anemia.  DISCHARGE DIAGNOSES: 1. Near syncope. 2. Hypertension. 3. Chronic kidney disease, stage III. 4. Urethral stricture. 5. Anemia.  CONSULTANTS:  None.  PROCEDURES: 1. CT scan of the head without contrast, performed on 03/19, which     showed no acute intracranial abnormality.  Stable atrophy with     diffuse white matter disease.  Atherosclerosis is noted. 2. MRI of the brain performed 03/19, which showed no acute     intracranial abnormality.  Atrophy and moderate white matter     disease.  Remote lacunar infarcts of the basal ganglia in addition     to moderate prominence of the dilated perivascular spaces.  Chronic     right maxillary sinus disease is noted. 3. MRA of the brain, which revealed a proximal left posterior cerebral     artery occlusion.  High-grade stenosis of the cavernous right     internal carotid artery, proximal to the ophthalmic segment.     Moderate stenosis of the left ophthalmic segment, internal carotid     artery with poststenotic dilatation.  Left A1 and M1 stenosis.     Moderate A2 stenosis bilaterally.  Moderate proximal right M2     stenosis.  Moderate left vertebral artery stenosis beyond the PICA     origin. 4. Echocardiogram performed, October 16, 2012, which revealed wall     thickness of the left ventricle consistent with mild LVH.  Systolic     function was mildly reduced.  EF was 45%.  Diffuse hypokinesis was     noted.  Dopplers were consistent with grade 1 diastolic     dysfunction.   Aortic valve with mild regurgitation, mitral valve     with mild regurgitation.  Atrial septum with redundancy of the     septum with borderline criteria for aneurysm.  Carotid Dopplers are     pending at time of discharge dictation.  HISTORY OF PRESENT ILLNESS:  Mr. Sligh is an 79 year old African American gentleman followed by Dr. Mertha Finders.  The patient was in his usual state of health until the morning of admission when he started to feel different.  His wife reported he was moving slower than normal. The patient reports he started to feel dizzy and as he started to fall his wife caught him before he could actually fall down.  He did not have any loss of consciousness.  He denied any chest pain or shortness of breath.  His wife reports he may have had transiently slurred speech. He also reports that he was having some difficulty with blurred vision, although he has had recent cataract surgery.  The ambulance was called. He was transported by EMS to the scene.  Most of his symptoms had resolved by  the time of their arrival to the emergency Department. He was subsequently admitted for further evaluation.  Please see the H and P for past medical history, family history, social history, and admission examination.  HOSPITAL COURSE: 1. Neuro, the patient was thoroughly evaluated.  Examinations were     nonfocal.  Imaging studies as noted above did not reveal any acute     changes.  He does have significant atherosclerotic vascular burden     based on MRA of the brain.  The patient remained stable.  He did     not have any focal neurologic deficits.  It was felt that there had     been no significant neurologic event and no further evaluation was     Required.  2. ID.  The patient's initial ER evaluation did reveal him to have an     abnormal urinalysis consistent with a UTI.  He was initially     started on IV antibiotics with Rocephin and was converted to p.o.     Ceftin.  The  patient remained afebrile.  Urine culture from October 15, 2012; revealed greater than 100,000 colonies of E. coli.     Sensitivities are pending at time of discharge dictation.  The     patient remained afebrile.  He is considered good candidate to     continue the antibiotic therapy at home.  With neurologic evaluation being complete with no acute findings     with the patient being afebrile and stable, with him being back to     his mental baseline and himself feeling that he is doing well.  He     is ready for discharge to home.  DISCHARGE EXAMINATION:  VITAL SIGNS:  Temperature was 98.2, blood pressure 153/88, heart rate 81, respirations 18, O2 sats 98%.  GENERAL APPEARANCE:  This is an elderly African American gentleman in no acute distress. 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  Conjunctiva sclerae were clear.  Pupils were equal, round, they were reactive.  Oropharynx revealed the patient have very poor dentition in the left lower mandible.  Mucous membranes were moist. NECK:  Supple. CHEST:  The patient is moving air well with no rales, wheezes, or rhonchi.  No increased work of breathing. CARDIOVASCULAR:  2+ radial pulse.  He had no JVD or carotid bruits.  His precordium was quiet.  He had a regular rate and rhythm. ABDOMEN:  Soft. NEUROLOGIC:  The patient is awake, alert.  He is oriented to person, place, time, and context speech is clear.  Cognition seems normal. Memory seems excellent.  Cranial nerves II through XII.  The patient has normal facial symmetry and movement.  Extraocular muscles were intact. Visual field testing was not performed.  Motor strength; the patient is moving all extremities to command without difficulty.  Cerebellar; the patient has no tremor is able do heel-to-shin maneuver without difficulty.  FINAL LABORATORY:  Lipid panel from October 16, 2012; with a cholesterol of 162, HDL was 39, LDL was 101.   Final renal function October 15, 2012; with a creatinine of 2.05, sodium was 137, potassium 4.2, chloride 103, CO2 24, glucose was 98.  Final CBC from October 15, 2012; white count of 5900, hemoglobin 11.2 g, hematocrit was 34.3%, platelet count 214,000. Differential on his CBC on October 15, 2012; reveals 63% segs, 24% lymphs, 10% monos, 3% eosinophils.  Urinalysis from the day of admission with  a specific gravity of 1.013. Urine was hazy.  He had moderate leukocyte esterase, small hemoglobin, WBCs were too numerous to count.  There were many bacteria.  Culture was E. coli with sensitivities pending.  DISCHARGE MEDICATIONS:  The patient will continue home medications Including: Tylenol, vitamin D 3000 units daily,  Durezol 0.05% drops to the eye 4 times daily,  HCTZ 12.5 mg daily Ilevro 0.3% suspension 1 drop in the right eye daily  omeprazole 20 mg daily.   We will add Ceftin 250 mg b.i.d. for an additional 5 days.  DISPOSITION:  The patient is discharged to home.  He is instructed to contact Dr. Geraldo Docker office for a followup appointment within 7-10 days of discharge.  The patient's condition at time of discharge dictation is stable and improved.     Heinz Knuckles Kellin Fifer, MD     MEN/MEDQ  D:  10/17/2012  T:  10/17/2012  Job:  [id number]

## 2012-11-12 NOTE — Progress Notes (Signed)
Need orders when possible please - pt coming for preop 11/24/12 thank you

## 2012-11-13 ENCOUNTER — Other Ambulatory Visit: Payer: Self-pay | Admitting: Orthopedic Surgery

## 2012-11-13 MED ORDER — BUPIVACAINE LIPOSOME 1.3 % IJ SUSP: 20.0000 mL | Freq: Once | INTRAMUSCULAR | Status: DC

## 2012-11-13 MED ORDER — DEXAMETHASONE SODIUM PHOSPHATE 10 MG/ML IJ SOLN: 10.0000 mg | Freq: Once | INTRAMUSCULAR | Status: DC

## 2012-11-13 NOTE — Progress Notes (Signed)
Preoperative surgical orders have been place into the Epic hospital system for Bradley [name] on 11/13/2012, 1:00 PM  by Mickel Crow for surgery on 12/01/2012.  Preop Total Knee orders including Experal, IV Tylenol, and IV Decadron as long as there are no contraindications to the above medications. Arlee Muslim, PA-C

## 2012-11-17 ENCOUNTER — Encounter (HOSPITAL_COMMUNITY): Payer: Self-pay | Admitting: Pharmacy Technician

## 2012-11-24 ENCOUNTER — Encounter (HOSPITAL_COMMUNITY): Payer: Self-pay

## 2012-11-24 LAB — SURGICAL PCR SCREEN
MRSA, PCR: NEGATIVE
Staphylococcus aureus: NEGATIVE

## 2012-11-24 LAB — COMPREHENSIVE METABOLIC PANEL
ALT: 11 U/L (ref 0–53)
Albumin: 3.4 g/dL — ABNORMAL LOW (ref 3.5–5.2)
Alkaline Phosphatase: 70 U/L (ref 39–117)
Calcium: 9 mg/dL (ref 8.4–10.5)
GFR calc Af Amer: 34 mL/min — ABNORMAL LOW (ref >90)
Potassium: 4.4 mEq/L (ref 3.5–5.1)
Sodium: 137 mEq/L (ref 135–145)
Total Protein: 6.8 g/dL (ref 6.0–8.3)

## 2012-11-24 LAB — APTT: aPTT: 41 seconds — ABNORMAL HIGH (ref 24–37)

## 2012-11-24 LAB — TYPE AND SCREEN
ABO/RH(D): AB POS
Antibody Screen: NEGATIVE

## 2012-11-24 LAB — CBC
MCH: 26.6 pg (ref 26.0–34.0)
MCHC: 31 g/dL (ref 30.0–36.0)
RDW: 12.7 % (ref 11.5–15.5)

## 2012-11-24 LAB — PROTIME-INR: Prothrombin Time: 13.4 seconds (ref 11.6–15.2)

## 2012-11-24 NOTE — Patient Instructions (Signed)
YOUR SURGERY IS SCHEDULED AT [facility]  ON:  Monday  5/5  REPORT TO [place] [facility]AY [facility] AT:  8:30 AM      PHONE # FOR SHORT STAY IS [phone]  DO NOT EAT OR DRINK ANYTHING AFTER MIDNIGHT THE NIGHT BEFORE YOUR SURGERY.  YOU MAY BRUSH YOUR TEETH, RINSE OUT YOUR MOUTH--BUT NO WATER, NO FOOD, NO CHEWING GUM, NO MINTS, NO CANDIES, NO CHEWING TOBACCO.  PLEASE TAKE THE FOLLOWING MEDICATIONS THE AM OF YOUR SURGERY WITH A FEW SIPS OF WATER:  OMEPRAZOLE ( PRILOSEC )    DO NOT BRING VALUABLES, MONEY, CREDIT CARDS.  DO NOT WEAR JEWELRY, MAKE-UP, NAIL POLISH AND NO METAL PINS OR CLIPS IN YOUR HAIR. CONTACT LENS, DENTURES / PARTIALS, GLASSES SHOULD NOT BE WORN TO SURGERY AND IN MOST CASES-HEARING AIDS WILL NEED TO BE REMOVED.  BRING YOUR GLASSES CASE, ANY EQUIPMENT NEEDED FOR YOUR CONTACT LENS. FOR PATIENTS ADMITTED TO THE HOSPITAL--CHECK OUT TIME THE DAY OF DISCHARGE IS 11:00 AM.  ALL INPATIENT ROOMS ARE PRIVATE - WITH BATHROOM, TELEPHONE, TELEVISION AND WIFI INTERNET.                              PLEASE READ OVER ANY  FACT SHEETS THAT YOU WERE GIVEN: MRSA INFORMATION, BLOOD TRANSFUSION INFORMATION, INCENTIVE SPIROMETER INFORMATION. FAILURE TO FOLLOW THESE INSTRUCTIONS MAY RESULT IN THE CANCELLATION OF YOUR SURGERY.   PATIENT SIGNATURE_________________________________

## 2012-11-24 NOTE — Pre-Procedure Instructions (Signed)
EKG AND CXR REPORTS ARE IN EPIC FROM 10/15/12. [place] ON CHART WITH MOST RECENT OFFICE NOTE 11/04/12. BLOOD DRAWN TODAY FOR T/S - BLOOD BANK CALLED - SAID THE NUMBERS ON TUBE OF BLOOD AND NUMBERS ON PINK REQUEST SLIP DO NOT MATCH - T/S TO BE RE-COLLECTED DAY OF SURGERY.  ORDER PLACED IN EPIC.

## 2012-11-24 NOTE — Progress Notes (Signed)
11/24/12 1230  OBSTRUCTIVE SLEEP APNEA  Have you ever been diagnosed with sleep apnea through a sleep study? No  Do you snore loudly (loud enough to be heard through closed doors)?  0  Do you often feel tired, fatigued, or sleepy during the daytime? 1  Has anyone observed you stop breathing during your sleep? 0  Do you have, or are you being treated for high blood pressure? 1  BMI more than 35 kg/m2? 0  Age over 77 years old? 1  Neck circumference greater than 40 cm/18 inches? 0  Gender: 1  Obstructive Sleep Apnea Score 4  Score 4 or greater  Results sent to PCP

## 2012-11-25 ENCOUNTER — Encounter (HOSPITAL_COMMUNITY)
Admission: RE | Admit: 2012-11-25 | Discharge: 2012-11-25 | Disposition: A | Discharge status: Home / Self Care | Payer: Medicare Other | Source: Ambulatory Visit | Attending: Orthopedic Surgery | Admitting: Orthopedic Surgery

## 2012-11-25 DIAGNOSIS — M1710 Unilateral primary osteoarthritis, unspecified knee: Secondary | ICD-10-CM | POA: Insufficient documentation

## 2012-11-25 DIAGNOSIS — Z0183 Encounter for blood typing: Secondary | ICD-10-CM | POA: Insufficient documentation

## 2012-11-25 DIAGNOSIS — Z01812 Encounter for preprocedural laboratory examination: Secondary | ICD-10-CM | POA: Insufficient documentation

## 2012-11-25 HISTORY — DX: Cardiac arrhythmia, unspecified: I49.9

## 2012-11-25 HISTORY — DX: Asymptomatic varicose veins of unspecified lower extremity: I83.90

## 2012-11-25 HISTORY — DX: Benign prostatic hyperplasia without lower urinary tract symptoms: N40.0

## 2012-11-25 HISTORY — DX: Chronic kidney disease, unspecified: N18.9

## 2012-11-25 HISTORY — DX: Unspecified strabismus: H50.9

## 2012-11-25 HISTORY — DX: Nonspecific reaction to tuberculin skin test without active tuberculosis: R76.11

## 2012-11-25 LAB — URINALYSIS, ROUTINE W REFLEX MICROSCOPIC
Glucose, UA: NEGATIVE mg/dL
Ketones, ur: NEGATIVE mg/dL
Protein, ur: NEGATIVE mg/dL
Urobilinogen, UA: 0.2 mg/dL (ref 0.0–1.0)

## 2012-11-25 LAB — URINE MICROSCOPIC-ADD ON

## 2012-11-26 NOTE — H&P (Signed)
TOTAL KNEE ADMISSION H&P  Patient is being admitted for left total knee arthroplasty.  Subjective:  Chief Complaint:left knee pain.  HPI: Bradley [name], 79 y.o. male, has a history of pain and functional disability in the left knee due to arthritis and has failed non-surgical conservative treatments for greater than 12 weeks to includeNSAID's and/or analgesics, corticosteriod injections, use of assistive devices and activity modification.  Onset of symptoms was gradual, starting >10 years ago with gradually worsening course since that time. The patient noted no past surgery on the left knee(s).  Patient currently rates pain in the left knee(s) at 6 out of 10 with activity. Patient has night pain, worsening of pain with activity and weight bearing, pain that interferes with activities of daily living, pain with passive range of motion, crepitus and joint swelling.  Patient has evidence of periarticular osteophytes, joint subluxation and joint space narrowing by imaging studies.There is no active infection.  Patient Active Problem List   Diagnosis Date Noted  . CKD (chronic kidney disease) stage 3, GFR 30-59 ml/min 10/15/2012  . Bacteriuria with pyuria 09/13/2011  . Near syncope 09/13/2011  . BPH (benign prostatic hyperplasia) 09/13/2011  . Hypertension 09/13/2011  . Urethral stricture 09/13/2011  . GERD (gastroesophageal reflux disease) 09/13/2011  . Osteoarthritis of both knees 09/13/2011   Past Medical History  Diagnosis Date  . Hypertension   . Cataract   . Syncope and collapse 10/15/2012    NEUROLOGIC EXAM - NO ACUTE FINDINGS - BUT "SIGNIFICANT ATHEROSCLEROTIC VASCULAR BURDEN BASED ON MRA OF BRAIN" - PER DISCHARGE SUMMARY 10/17/12 [place]  . Pneumonia 1980's?  Marland Kitchen GERD (gastroesophageal reflux disease)   . UTI (urinary tract infection)     UTI DIAGNOSED AND TREATED WHEN PT HOSP FOR SYNCOPE 10/15/12  . Strabismus     RIGHT EYE  . BPH (benign prostatic hypertrophy)   . PPD positive      HX POSITIVE PPD WITH NEGATIVE CXR  . Varicosities of leg     LEFT  . Chronic kidney disease     CHRONIC RENAL INSUFFICIENCY - CREATININE 1.8 TO 1.9  . Dysrhythmia     "VENTRICULAR ECTOPY"  --NEGATIVE STRESS TEST FOR CORONARY ISCHEMIA, MARCH 2008 - PER OFFICE NOTES DR. R. N. GATES.  Marland Kitchen Arthritis     "knees" (10/15/2012)    Past Surgical History  Procedure Laterality Date  . Transurethral resection of prostate  2006  . Cataract extraction w/ intraocular lens implant      BOTH EYES  . Tonsillectomy      "I was young" (10/15/2012)     Current outpatient prescriptions: aspirin 325 MG tablet, Take 1 tablet (325 mg total) by mouth daily., Disp: , Rfl: ;  cholecalciferol (VITAMIN D) 1000 UNITS tablet, Take 1,000 Units by mouth daily. , Disp: , Rfl: ;  Difluprednate (DUREZOL) 0.05 % EMUL, Place 2 drops into the left eye 2 (two) times daily. , Disp: , Rfl: ;  hydrochlorothiazide (HYDRODIURIL) 12.5 MG tablet, Take 12.5 mg by mouth. TAKES EVERY OTHER DAY, Disp: , Rfl:  omeprazole (PRILOSEC) 20 MG capsule, Take 20 mg by mouth daily., Disp: , Rfl: ;  OVER THE COUNTER MEDICATION, OVER THE COUNTER STOOL SOFTNERS DAILY AS NEEDED, Disp: , Rfl:   No Known Allergies  History  Substance Use Topics  . Smoking status: Never Smoker   . Smokeless tobacco: Never Used  . Alcohol Use: No    Family History Father deceased age 64 due to TB Mother deceased age 2  due to CHF  Review of Systems  Constitutional: Negative for fever, chills, weight loss, malaise/fatigue and diaphoresis.  HENT: Positive for tinnitus. Negative for hearing loss, ear pain, nosebleeds, congestion, sore throat, neck pain and ear discharge.   Eyes: Positive for blurred vision. Negative for double vision, photophobia, pain, discharge and redness.  Respiratory: Negative.  Negative for stridor.   Cardiovascular: Negative.   Gastrointestinal: Negative.   Genitourinary: Positive for frequency. Negative for dysuria, urgency, hematuria and  flank pain.  Musculoskeletal: Positive for joint pain. Negative for myalgias, back pain and falls.       Left knee pain  Skin: Negative.   Neurological: Positive for dizziness and weakness. Negative for tingling, tremors, sensory change, speech change, focal weakness, seizures, loss of consciousness and headaches.  Endo/Heme/Allergies: Negative.   Psychiatric/Behavioral: Negative.     Objective:  Physical Exam  Constitutional: He is oriented to person, place, and time. He appears well-developed and well-nourished. No distress.  HENT:  Head: Normocephalic and atraumatic.  Left Ear: External ear normal.  Mouth/Throat: Oropharynx is clear and moist.  Eyes: Conjunctivae and EOM are normal.  Neck: Normal range of motion. Neck supple. No tracheal deviation present. No thyromegaly present.  Cardiovascular: Normal rate, regular rhythm, normal heart sounds and intact distal pulses.   No murmur heard. Respiratory: Effort normal and breath sounds normal. No respiratory distress. He has no wheezes. He exhibits no tenderness.  GI: Soft. Bowel sounds are normal. He exhibits no distension and no mass. There is no tenderness.  Musculoskeletal:       Right hip: Normal.       Left hip: Normal.       Right knee: He exhibits decreased range of motion. He exhibits no swelling, no effusion and no erythema. No tenderness found.       Left knee: He exhibits decreased range of motion, swelling and effusion. He exhibits no erythema. Tenderness found. Medial joint line tenderness noted.       Right lower leg: He exhibits no tenderness and no swelling.       Left lower leg: He exhibits no tenderness and no swelling.  The left knee shows moderate sized effusion. He has significant varus deformity. His range is 10 to 120. He is tender diffusely with no instability. The right knee no effusion. Slight varus. Range 5 to 125 and no instability, but he is tender medially.  Lymphadenopathy:    He has no cervical  adenopathy.  Neurological: He is alert and oriented to person, place, and time. He has normal strength and normal reflexes. No sensory deficit.  Skin: No rash noted. He is not diaphoretic. No erythema.  Psychiatric: He has a normal mood and affect. His behavior is normal.    Pulse: 72 (Regular) BP: 132/76 (Sitting, Left Arm, Standard)   Estimated body mass index is 26.91 kg/(m^2) as calculated from the following:   Height as of 09/12/11: 6\' 1"  (1.854 m).   Weight as of 09/12/11: 92.5 kg (203 lb 14.8 oz).   Imaging Review Plain radiographs demonstrate severe degenerative joint disease of the left knee(s). The overall alignment issignificant varus. The bone quality appears to be fair for age and reported activity level.  Assessment/Plan:  End stage arthritis, left knee   The patient history, physical examination, clinical judgment of the provider and imaging studies are consistent with end stage degenerative joint disease of the left knee(s) and total knee arthroplasty is deemed medically necessary. The treatment options including medical management, injection  therapy arthroscopy and arthroplasty were discussed at length. The risks and benefits of total knee arthroplasty were presented and reviewed. The risks due to aseptic loosening, infection, stiffness, patella tracking problems, thromboembolic complications and other imponderables were discussed. The patient acknowledged the explanation, agreed to proceed with the plan and consent was signed. Patient is being admitted for inpatient treatment for surgery, pain control, PT, OT, prophylactic antibiotics, VTE prophylaxis, progressive ambulation and ADL's and discharge planning. The patient is planning to be discharged to skilled nursing facility      No Name, [place]

## 2012-11-27 LAB — URINE CULTURE: Colony Count: >=100000

## 2012-11-27 NOTE — Pre-Procedure Instructions (Signed)
ON 11/25/12 PT'S ABNORMAL URINALYSIS, URINE MICROSCOPIC AND URINE CULTURE IN PROCESS REPORTS AND PREOP PTT, CMET REPORTS WERE FAXED TO DR. ALUISIO'S OFFICE AND DR. Anne Fu OFFICE FAXED NOTE BACK THAT PT WAS CALLED TO START CIPRO.

## 2012-11-28 NOTE — Pre-Procedure Instructions (Signed)
URINE CULTURE REPORT FAXED VIA EPIC TO DR, ALUISIO'S OFFICE

## 2012-12-01 ENCOUNTER — Encounter (HOSPITAL_COMMUNITY)
Admission: RE | Disposition: A | Discharge status: Skilled Nursing Facility | Payer: Self-pay | Source: Ambulatory Visit | Attending: Orthopedic Surgery

## 2012-12-01 ENCOUNTER — Inpatient Hospital Stay (HOSPITAL_COMMUNITY): Payer: Medicare Other | Admitting: Anesthesiology

## 2012-12-01 ENCOUNTER — Encounter (HOSPITAL_COMMUNITY): Payer: Self-pay | Admitting: Anesthesiology

## 2012-12-01 ENCOUNTER — Inpatient Hospital Stay (HOSPITAL_COMMUNITY)
Admission: RE | Admit: 2012-12-01 | Discharge: 2012-12-07 | DRG: 470 | Disposition: A | Discharge status: Skilled Nursing Facility | Payer: Medicare Other | Source: Ambulatory Visit | Attending: Orthopedic Surgery | Admitting: Orthopedic Surgery

## 2012-12-01 ENCOUNTER — Encounter (HOSPITAL_COMMUNITY): Payer: Self-pay | Admitting: *Deleted

## 2012-12-01 DIAGNOSIS — M1710 Unilateral primary osteoarthritis, unspecified knee: Principal | ICD-10-CM | POA: Diagnosis present

## 2012-12-01 DIAGNOSIS — I129 Hypertensive chronic kidney disease with stage 1 through stage 4 chronic kidney disease, or unspecified chronic kidney disease: Secondary | ICD-10-CM | POA: Diagnosis present

## 2012-12-01 DIAGNOSIS — H519 Unspecified disorder of binocular movement: Secondary | ICD-10-CM | POA: Diagnosis present

## 2012-12-01 DIAGNOSIS — Z9289 Personal history of other medical treatment: Secondary | ICD-10-CM

## 2012-12-01 DIAGNOSIS — K219 Gastro-esophageal reflux disease without esophagitis: Secondary | ICD-10-CM | POA: Diagnosis present

## 2012-12-01 DIAGNOSIS — Z01812 Encounter for preprocedural laboratory examination: Secondary | ICD-10-CM

## 2012-12-01 DIAGNOSIS — N183 Chronic kidney disease, stage 3 unspecified: Secondary | ICD-10-CM | POA: Diagnosis present

## 2012-12-01 DIAGNOSIS — D62 Acute posthemorrhagic anemia: Secondary | ICD-10-CM | POA: Diagnosis not present

## 2012-12-01 DIAGNOSIS — Z79899 Other long term (current) drug therapy: Secondary | ICD-10-CM

## 2012-12-01 DIAGNOSIS — N390 Urinary tract infection, site not specified: Secondary | ICD-10-CM | POA: Diagnosis present

## 2012-12-01 DIAGNOSIS — Z96652 Presence of left artificial knee joint: Secondary | ICD-10-CM

## 2012-12-01 DIAGNOSIS — E871 Hypo-osmolality and hyponatremia: Secondary | ICD-10-CM | POA: Diagnosis not present

## 2012-12-01 DIAGNOSIS — Z7982 Long term (current) use of aspirin: Secondary | ICD-10-CM

## 2012-12-01 DIAGNOSIS — N320 Bladder-neck obstruction: Secondary | ICD-10-CM | POA: Diagnosis present

## 2012-12-01 DIAGNOSIS — K5900 Constipation, unspecified: Secondary | ICD-10-CM | POA: Diagnosis not present

## 2012-12-01 HISTORY — PX: TOTAL KNEE ARTHROPLASTY: SHX125

## 2012-12-01 SURGERY — ARTHROPLASTY, KNEE, TOTAL
Anesthesia: Spinal | Site: Knee | Laterality: Left | Wound class: Clean

## 2012-12-01 MED ORDER — BISACODYL 10 MG RE SUPP
10.0000 mg | Freq: Every day | RECTAL | Status: DC | PRN
  Filled 2012-12-01: qty 1

## 2012-12-01 MED ORDER — DEXTROSE 5 % IV SOLN: 500.0000 mg | Freq: Four times a day (QID) | INTRAVENOUS | Status: DC | PRN

## 2012-12-01 MED ORDER — DIFLUPREDNATE 0.05 % OP EMUL: 2.0000 [drp] | Freq: Two times a day (BID) | OPHTHALMIC | Status: DC

## 2012-12-01 MED ORDER — METOCLOPRAMIDE HCL 5 MG/ML IJ SOLN
5.0000 mg | Freq: Three times a day (TID) | INTRAMUSCULAR | Status: DC | PRN
  Administered 2012-12-02: 5 mg via INTRAVENOUS
  Filled 2012-12-01: qty 2

## 2012-12-01 MED ORDER — MORPHINE SULFATE 2 MG/ML IJ SOLN
1.0000 mg | INTRAMUSCULAR | Status: DC | PRN
  Administered 2012-12-01: 2 mg via INTRAVENOUS

## 2012-12-01 MED ORDER — SODIUM CHLORIDE 0.9 % IJ SOLN
INTRAMUSCULAR | Status: DC | PRN
  Administered 2012-12-01: 12:00:00

## 2012-12-01 MED ORDER — 0.9 % SODIUM CHLORIDE (POUR BTL) OPTIME
TOPICAL | Status: DC | PRN
  Administered 2012-12-01: 1000 mL

## 2012-12-01 MED ORDER — ONDANSETRON HCL 4 MG PO TABS
4.0000 mg | ORAL_TABLET | Freq: Four times a day (QID) | ORAL | Status: DC | PRN
  Administered 2012-12-02: 4 mg via ORAL
  Filled 2012-12-01: qty 1

## 2012-12-01 MED ORDER — SODIUM CHLORIDE 0.9 % IV SOLN
INTRAVENOUS | Status: DC
  Administered 2012-12-01 – 2012-12-04 (×4): via INTRAVENOUS

## 2012-12-01 MED ORDER — ACETAMINOPHEN 10 MG/ML IV SOLN
INTRAVENOUS | Status: DC | PRN
  Administered 2012-12-01: 1000 mg via INTRAVENOUS

## 2012-12-01 MED ORDER — BUPIVACAINE LIPOSOME 1.3 % IJ SUSP
20.0000 mL | Freq: Once | INTRAMUSCULAR | Status: DC
  Filled 2012-12-01: qty 20

## 2012-12-01 MED ORDER — ONDANSETRON HCL 4 MG/2ML IJ SOLN
4.0000 mg | Freq: Four times a day (QID) | INTRAMUSCULAR | Status: DC | PRN
  Administered 2012-12-01: 4 mg via INTRAVENOUS
  Filled 2012-12-01: qty 2

## 2012-12-01 MED ORDER — POLYETHYLENE GLYCOL 3350 17 G PO PACK
17.0000 g | PACK | Freq: Every day | ORAL | Status: DC | PRN
  Filled 2012-12-01: qty 1

## 2012-12-01 MED ORDER — PHENOL 1.4 % MT LIQD: 1.0000 | OROMUCOSAL | Status: DC | PRN

## 2012-12-01 MED ORDER — ENOXAPARIN SODIUM 30 MG/0.3ML ~~LOC~~ SOLN
30.0000 mg | Freq: Two times a day (BID) | SUBCUTANEOUS | Status: DC
  Administered 2012-12-02 – 2012-12-07 (×11): 30 mg via SUBCUTANEOUS
  Filled 2012-12-01 (×15): qty 0.3

## 2012-12-01 MED ORDER — ACETAMINOPHEN 10 MG/ML IV SOLN: 1000.0000 mg | Freq: Once | INTRAVENOUS | Status: DC

## 2012-12-01 MED ORDER — TRAMADOL HCL 50 MG PO TABS
50.0000 mg | ORAL_TABLET | Freq: Four times a day (QID) | ORAL | Status: DC | PRN
  Administered 2012-12-03: 50 mg via ORAL
  Administered 2012-12-04: 100 mg via ORAL
  Administered 2012-12-06: 50 mg via ORAL
  Filled 2012-12-01: qty 2
  Filled 2012-12-01 (×2): qty 1
  Filled 2012-12-01: qty 2

## 2012-12-01 MED ORDER — CHLORHEXIDINE GLUCONATE 4 % EX LIQD
60.0000 mL | Freq: Once | CUTANEOUS | Status: DC
Start: 2012-12-01 — End: 2012-12-01

## 2012-12-01 MED ORDER — MORPHINE SULFATE 2 MG/ML IJ SOLN
INTRAMUSCULAR | Status: AC
  Filled 2012-12-01: qty 1

## 2012-12-01 MED ORDER — FLEET ENEMA 7-19 GM/118ML RE ENEM: 1.0000 | ENEMA | Freq: Once | RECTAL | Status: AC | PRN

## 2012-12-01 MED ORDER — CEFAZOLIN SODIUM-DEXTROSE 2-3 GM-% IV SOLR
2.0000 g | INTRAVENOUS | Status: AC
  Administered 2012-12-01: 2 g via INTRAVENOUS

## 2012-12-01 MED ORDER — METHOCARBAMOL 500 MG PO TABS
500.0000 mg | ORAL_TABLET | Freq: Four times a day (QID) | ORAL | Status: DC | PRN
  Administered 2012-12-01 – 2012-12-04 (×4): 500 mg via ORAL
  Filled 2012-12-01 (×3): qty 1

## 2012-12-01 MED ORDER — SODIUM CHLORIDE 0.9 % IV SOLN: INTRAVENOUS | Status: DC

## 2012-12-01 MED ORDER — MENTHOL 3 MG MT LOZG: 1.0000 | LOZENGE | OROMUCOSAL | Status: DC | PRN

## 2012-12-01 MED ORDER — DOCUSATE SODIUM 100 MG PO CAPS
100.0000 mg | ORAL_CAPSULE | Freq: Two times a day (BID) | ORAL | Status: DC
  Administered 2012-12-02 – 2012-12-07 (×10): 100 mg via ORAL
  Filled 2012-12-01 (×3): qty 1

## 2012-12-01 MED ORDER — ACETAMINOPHEN 10 MG/ML IV SOLN
1000.0000 mg | Freq: Four times a day (QID) | INTRAVENOUS | Status: AC
  Administered 2012-12-01 – 2012-12-02 (×4): 1000 mg via INTRAVENOUS
  Filled 2012-12-01 (×6): qty 100

## 2012-12-01 MED ORDER — ACETAMINOPHEN 325 MG PO TABS
650.0000 mg | ORAL_TABLET | Freq: Four times a day (QID) | ORAL | Status: DC | PRN
  Administered 2012-12-07: 650 mg via ORAL
  Filled 2012-12-01: qty 2

## 2012-12-01 MED ORDER — FENTANYL CITRATE 0.05 MG/ML IJ SOLN
INTRAMUSCULAR | Status: DC | PRN
  Administered 2012-12-01: 50 ug via INTRAVENOUS

## 2012-12-01 MED ORDER — PHENYLEPHRINE HCL 10 MG/ML IJ SOLN
10.0000 mg | INTRAVENOUS | Status: DC | PRN
  Administered 2012-12-01: 10 ug via INTRAVENOUS

## 2012-12-01 MED ORDER — DEXAMETHASONE 6 MG PO TABS
10.0000 mg | ORAL_TABLET | Freq: Every day | ORAL | Status: AC
  Administered 2012-12-02: 10 mg via ORAL
  Filled 2012-12-01: qty 1

## 2012-12-01 MED ORDER — MIDAZOLAM HCL 5 MG/5ML IJ SOLN
INTRAMUSCULAR | Status: DC | PRN
  Administered 2012-12-01: 1 mg via INTRAVENOUS

## 2012-12-01 MED ORDER — ACETAMINOPHEN 650 MG RE SUPP: 650.0000 mg | Freq: Four times a day (QID) | RECTAL | Status: DC | PRN

## 2012-12-01 MED ORDER — MEPERIDINE HCL 50 MG/ML IJ SOLN: 6.2500 mg | INTRAMUSCULAR | Status: DC | PRN

## 2012-12-01 MED ORDER — BUPIVACAINE IN DEXTROSE 0.75-8.25 % IT SOLN
INTRATHECAL | Status: DC | PRN
  Administered 2012-12-01: 1.8 mL via INTRATHECAL

## 2012-12-01 MED ORDER — BUPIVACAINE HCL 0.25 % IJ SOLN
INTRAMUSCULAR | Status: DC | PRN
  Administered 2012-12-01: 20 mL

## 2012-12-01 MED ORDER — PROMETHAZINE HCL 25 MG/ML IJ SOLN: 6.2500 mg | INTRAMUSCULAR | Status: DC | PRN

## 2012-12-01 MED ORDER — HYDROCHLOROTHIAZIDE 25 MG PO TABS
12.5000 mg | ORAL_TABLET | Freq: Every day | ORAL | Status: DC
  Filled 2012-12-01 (×2): qty 0.5

## 2012-12-01 MED ORDER — DEXAMETHASONE SODIUM PHOSPHATE 10 MG/ML IJ SOLN
10.0000 mg | Freq: Every day | INTRAMUSCULAR | Status: AC
  Filled 2012-12-01: qty 1

## 2012-12-01 MED ORDER — SODIUM CHLORIDE 0.9 % IR SOLN
Status: DC | PRN
  Administered 2012-12-01: 1000 mL

## 2012-12-01 MED ORDER — LACTATED RINGERS IV SOLN
INTRAVENOUS | Status: DC
  Administered 2012-12-01: 1000 mL via INTRAVENOUS
  Administered 2012-12-01: 12:00:00 via INTRAVENOUS

## 2012-12-01 MED ORDER — OXYCODONE HCL 5 MG PO TABS
5.0000 mg | ORAL_TABLET | ORAL | Status: DC | PRN
  Administered 2012-12-01 (×2): 5 mg via ORAL
  Administered 2012-12-02 (×3): 10 mg via ORAL
  Administered 2012-12-02: 5 mg via ORAL
  Administered 2012-12-02: 10 mg via ORAL
  Administered 2012-12-03 – 2012-12-04 (×2): 5 mg via ORAL
  Filled 2012-12-01 (×3): qty 2
  Filled 2012-12-01: qty 1
  Filled 2012-12-01: qty 2
  Filled 2012-12-01: qty 1
  Filled 2012-12-01: qty 2
  Filled 2012-12-01 (×2): qty 1

## 2012-12-01 MED ORDER — FENTANYL CITRATE 0.05 MG/ML IJ SOLN: 25.0000 ug | INTRAMUSCULAR | Status: DC | PRN

## 2012-12-01 MED ORDER — DIPHENHYDRAMINE HCL 12.5 MG/5ML PO ELIX: 12.5000 mg | ORAL_SOLUTION | ORAL | Status: DC | PRN

## 2012-12-01 MED ORDER — METOCLOPRAMIDE HCL 10 MG PO TABS: 5.0000 mg | ORAL_TABLET | Freq: Three times a day (TID) | ORAL | Status: DC | PRN

## 2012-12-01 MED ORDER — PROPOFOL 10 MG/ML IV EMUL
INTRAVENOUS | Status: DC | PRN
  Administered 2012-12-01: 100 ug/kg/min via INTRAVENOUS

## 2012-12-01 MED ORDER — CEFAZOLIN SODIUM-DEXTROSE 2-3 GM-% IV SOLR
2.0000 g | Freq: Four times a day (QID) | INTRAVENOUS | Status: AC
  Administered 2012-12-01 – 2012-12-02 (×2): 2 g via INTRAVENOUS
  Filled 2012-12-01 (×2): qty 50

## 2012-12-01 MED ORDER — PANTOPRAZOLE SODIUM 40 MG PO TBEC
40.0000 mg | DELAYED_RELEASE_TABLET | Freq: Every day | ORAL | Status: DC
  Filled 2012-12-01: qty 1

## 2012-12-01 MED ORDER — PROPOFOL 10 MG/ML IV BOLUS
INTRAVENOUS | Status: DC | PRN
  Administered 2012-12-01: 40 mg via INTRAVENOUS

## 2012-12-01 SURGICAL SUPPLY — 54 items
BAG SPEC THK2 15X12 ZIP CLS (MISCELLANEOUS) ×1
BAG ZIPLOCK 12X15 (MISCELLANEOUS) ×2 IMPLANT
BANDAGE ELASTIC 6 VELCRO ST LF (GAUZE/BANDAGES/DRESSINGS) ×2 IMPLANT
BANDAGE ESMARK 6X9 LF (GAUZE/BANDAGES/DRESSINGS) ×1 IMPLANT
BLADE SAG 18X100X1.27 (BLADE) ×2 IMPLANT
BLADE SAW SGTL 11.0X1.19X90.0M (BLADE) ×3 IMPLANT
BNDG CMPR 9X6 STRL LF SNTH (GAUZE/BANDAGES/DRESSINGS) ×1
BNDG ESMARK 6X9 LF (GAUZE/BANDAGES/DRESSINGS) ×2
BOWL SMART MIX CTS (DISPOSABLE) ×2 IMPLANT
CEMENT HV SMART SET (Cement) ×4 IMPLANT
CLOTH BEACON ORANGE TIMEOUT ST (SAFETY) ×2 IMPLANT
CUFF TOURN SGL QUICK 34 (TOURNIQUET CUFF) ×2
CUFF TRNQT CYL 34X4X40X1 (TOURNIQUET CUFF) ×1 IMPLANT
DRAPE EXTREMITY T 121X128X90 (DRAPE) ×2 IMPLANT
DRAPE POUCH INSTRU U-SHP 10X18 (DRAPES) ×2 IMPLANT
DRAPE U-SHAPE 47X51 STRL (DRAPES) ×2 IMPLANT
DRSG ADAPTIC 3X8 NADH LF (GAUZE/BANDAGES/DRESSINGS) ×2 IMPLANT
DRSG PAD ABDOMINAL 8X10 ST (GAUZE/BANDAGES/DRESSINGS) ×1 IMPLANT
DURAPREP 26ML APPLICATOR (WOUND CARE) ×2 IMPLANT
ELECT REM PT RETURN 9FT ADLT (ELECTROSURGICAL) ×2
ELECTRODE REM PT RTRN 9FT ADLT (ELECTROSURGICAL) ×1 IMPLANT
EVACUATOR 1/8 PVC DRAIN (DRAIN) ×2 IMPLANT
FACESHIELD LNG OPTICON STERILE (SAFETY) ×10 IMPLANT
GLOVE BIO SURGEON STRL SZ8 (GLOVE) ×2 IMPLANT
GLOVE BIOGEL PI IND STRL 8 (GLOVE) ×2 IMPLANT
GLOVE BIOGEL PI INDICATOR 8 (GLOVE) ×1
GLOVE SURG SS PI 6.5 STRL IVOR (GLOVE) ×4 IMPLANT
GOWN STRL NON-REIN LRG LVL3 (GOWN DISPOSABLE) ×4 IMPLANT
GOWN STRL REIN XL XLG (GOWN DISPOSABLE) ×3 IMPLANT
HANDPIECE INTERPULSE COAX TIP (DISPOSABLE) ×2
IMMOBILIZER KNEE 20 (SOFTGOODS) ×2
IMMOBILIZER KNEE 20 THIGH 36 (SOFTGOODS) ×1 IMPLANT
KIT BASIN OR (CUSTOM PROCEDURE TRAY) ×2 IMPLANT
MANIFOLD NEPTUNE II (INSTRUMENTS) ×2 IMPLANT
NDL SAFETY ECLIPSE 18X1.5 (NEEDLE) ×1 IMPLANT
NEEDLE HYPO 18GX1.5 SHARP (NEEDLE) ×2
NS IRRIG 1000ML POUR BTL (IV SOLUTION) ×2 IMPLANT
PACK TOTAL JOINT (CUSTOM PROCEDURE TRAY) ×2 IMPLANT
PADDING CAST COTTON 6X4 STRL (CAST SUPPLIES) ×6 IMPLANT
POSITIONER SURGICAL ARM (MISCELLANEOUS) ×2 IMPLANT
SET HNDPC FAN SPRY TIP SCT (DISPOSABLE) ×1 IMPLANT
SPONGE GAUZE 4X4 12PLY (GAUZE/BANDAGES/DRESSINGS) ×2 IMPLANT
STRIP CLOSURE SKIN 1/2X4 (GAUZE/BANDAGES/DRESSINGS) ×3 IMPLANT
SUCTION FRAZIER 12FR DISP (SUCTIONS) ×2 IMPLANT
SUT MNCRL AB 4-0 PS2 18 (SUTURE) ×2 IMPLANT
SUT VIC AB 2-0 CT1 27 (SUTURE) ×6
SUT VIC AB 2-0 CT1 TAPERPNT 27 (SUTURE) ×3 IMPLANT
SUT VLOC 180 0 24IN GS25 (SUTURE) ×2 IMPLANT
SYR 20CC LL (SYRINGE) ×1 IMPLANT
SYR 50ML LL SCALE MARK (SYRINGE) ×2 IMPLANT
TOWEL OR 17X26 10 PK STRL BLUE (TOWEL DISPOSABLE) ×4 IMPLANT
TRAY FOLEY CATH 14FRSI W/METER (CATHETERS) ×2 IMPLANT
WATER STERILE IRR 1500ML POUR (IV SOLUTION) ×2 IMPLANT
WRAP KNEE MAXI GEL POST OP (GAUZE/BANDAGES/DRESSINGS) ×3 IMPLANT

## 2012-12-01 NOTE — Anesthesia Procedure Notes (Addendum)
Spinal  End time: 12/01/2012 11:43 AM Staffing Anesthesiologist: Montez Hageman CRNA/Resident: Raquel Racey E Preanesthetic Checklist Completed: patient identified, site marked, surgical consent, pre-op evaluation, timeout performed, IV checked, risks and benefits discussed and monitors and equipment checked Spinal Block Patient position: sitting Prep: Betadine Patient monitoring: continuous pulse ox and blood pressure Approach: midline Location: L3-4 Injection technique: single-shot Needle Needle type: Spinocan  Needle gauge: 22 G Additional Notes Spinal tray checked , expiration4/2015. CSF x 3 . Dr Marcell Barlow placed needle and CRNA injected drugs. Pt tolerated well   Spinal

## 2012-12-01 NOTE — Progress Notes (Signed)
Pt states he finished taking his Cipro. ( times 3 days)

## 2012-12-01 NOTE — Anesthesia Preprocedure Evaluation (Addendum)
Anesthesia Evaluation  Patient identified by MRN, date of birth, ID band Patient awake    Reviewed: Allergy & Precautions, H&P , NPO status , Patient's Chart, lab work & pertinent test results  Airway Mallampati: II TM Distance: >3 FB Neck ROM: Full    Dental no notable dental hx.    Pulmonary neg pulmonary ROS,  breath sounds clear to auscultation  Pulmonary exam normal       Cardiovascular hypertension, - Peripheral Vascular Disease - dysrhythmias Rhythm:Regular Rate:Normal     Neuro/Psych negative neurological ROS  negative psych ROS   GI/Hepatic negative GI ROS, Neg liver ROS,   Endo/Other  negative endocrine ROS  Renal/GU Renal InsufficiencyRenal disease  negative genitourinary   Musculoskeletal negative musculoskeletal ROS (+)   Abdominal   Peds negative pediatric ROS (+)  Hematology negative hematology ROS (+)   Anesthesia Other Findings   Reproductive/Obstetrics negative OB ROS                          Anesthesia Physical Anesthesia Plan  ASA: II  Anesthesia Plan: Spinal   Post-op Pain Management:    Induction: Intravenous  Airway Management Planned: Simple Face Mask  Additional Equipment:   Intra-op Plan:   Post-operative Plan:   Informed Consent: I have reviewed the patients History and Physical, chart, labs and discussed the procedure including the risks, benefits and alternatives for the proposed anesthesia with the patient or authorized representative who has indicated his/her understanding and acceptance.   Dental advisory given  Plan Discussed with: CRNA  Anesthesia Plan Comments:         Anesthesia Quick Evaluation

## 2012-12-01 NOTE — Anesthesia Postprocedure Evaluation (Signed)
  Anesthesia Post-op Note  Patient: Bradley [name]  Procedure(s) Performed: Procedure(s) (LRB): LEFT TOTAL KNEE ARTHROPLASTY (Left)  Patient Location: PACU  Anesthesia Type: Spinal  Level of Consciousness: awake and alert   Airway and Oxygen Therapy: Patient Spontanous Breathing  Post-op Pain: mild  Post-op Assessment: Post-op Vital signs reviewed, Patient's Cardiovascular Status Stable, Respiratory Function Stable, Patent Airway and No signs of Nausea or vomiting  Last Vitals:  Filed Vitals:   12/01/12 1500  BP: 163/97  Pulse: 57  Temp: 36.5 C  Resp: 14    Post-op Vital Signs: stable   Complications: No apparent anesthesia complications

## 2012-12-01 NOTE — Interval H&P Note (Signed)
History and Physical Interval Note:  12/01/2012 9:41 AM  Bradley [name]  has presented today for surgery, with the diagnosis of osteoarthritis of the left knee  The various methods of treatment have been discussed with the patient and family. After consideration of risks, benefits and other options for treatment, the patient has consented to  Procedure(s): LEFT TOTAL KNEE ARTHROPLASTY (Left) as a surgical intervention .  The patient's history has been reviewed, patient examined, no change in status, stable for surgery.  I have reviewed the patient's chart and labs.  Questions were answered to the patient's satisfaction.     [name]

## 2012-12-01 NOTE — Plan of Care (Signed)
Problem: Consults Goal: Diagnosis- Total Joint Replacement Primary Total Knee     

## 2012-12-01 NOTE — Op Note (Signed)
Pre-operative diagnosis- Osteoarthritis  Left knee(s)  Post-operative diagnosis- Osteoarthritis Left knee(s)  Procedure-  Left  Total Knee Arthroplasty  Surgeon- Dione Plover. Nessie Nong, MD  Assistant- Ardeen Jourdain, PA-C   Anesthesia-  Spinal EBL-* No blood loss amount entered *  Drains Hemovac  Tourniquet time-  Total Tourniquet Time Documented: Thigh (Left) - 41 minutes Total: Thigh (Left) - 41 minutes    Complications- None  Condition-PACU - hemodynamically stable.   Brief Clinical Note   Bradley [name] is a 79 y.o. year old male with end stage OA of his left knee with progressively worsening pain and dysfunction. He has constant pain, with activity and at rest and significant functional deficits with difficulties even with ADLs. He has had extensive non-op management including analgesics, injections of cortisone and viscosupplements, and home exercise program, but remains in significant pain with significant dysfunction. Radiographs show bone on bone arthritis all 3 compartments. He presents now for left Total Knee Arthroplasty.    Procedure in detail---   The patient is brought into the operating room and positioned supine on the operating table. After successful administration of  Spinal,   a tourniquet is placed high on the  Left thigh(s) and the lower extremity is prepped and draped in the usual sterile fashion. Time out is performed by the operating team and then the  Left lower extremity is wrapped in Esmarch, knee flexed and the tourniquet inflated to 300 mmHg.       A midline incision is made with a ten blade through the subcutaneous tissue to the level of the extensor mechanism. A fresh blade is used to make a medial parapatellar arthrotomy. Soft tissue over the proximal medial tibia is subperiosteally elevated to the joint line with a knife and into the semimembranosus bursa with a Cobb elevator. Soft tissue over the proximal lateral tibia is elevated with attention being  paid to avoiding the patellar tendon on the tibial tubercle. The patella is everted, knee flexed 90 degrees and the ACL and PCL are removed. Findings are bone on bone all three compartments with massive global osteophytes.        The drill is used to create a starting hole in the distal femur and the canal is thoroughly irrigated with sterile saline to remove the fatty contents. The 5 degree Left  valgus alignment guide is placed into the femoral canal and the distal femoral cutting block is pinned to remove 10 mm off the distal femur. Resection is made with an oscillating saw.      The tibia is subluxed forward and the menisci are removed. The extramedullary alignment guide is placed referencing proximally at the medial aspect of the tibial tubercle and distally along the second metatarsal axis and tibial crest. The block is pinned to remove 70mm off the more deficient medial  side. He had a large medial defect.Resection is made with an oscillating saw. Size 4is the most appropriate size for the tibia and the proximal tibia is prepared with the modular drill and keel punch for that size.      The femoral sizing guide is placed and size 4 is most appropriate. Rotation is marked off the epicondylar axis and confirmed by creating a rectangular flexion gap at 90 degrees. The size 4 cutting block is pinned in this rotation and the anterior, posterior and chamfer cuts are made with the oscillating saw. The intercondylar block is then placed and that cut is made.      Trial size  4 tibial component, trial size 4 posterior stabilized femur and a 15  mm posterior stabilized rotating platform insert trial is placed. Full extension is achieved with excellent varus/valgus and anterior/posterior balance throughout full range of motion. The patella is everted and thickness measured to be 24  mm. Free hand resection is taken to 14 mm, a 41 template is placed, lug holes are drilled, trial patella is placed, and it tracks  normally. Osteophytes are removed off the posterior femur with the trial in place. All trials are removed and the cut bone surfaces prepared with pulsatile lavage. Cement is mixed and once ready for implantation, the size 4 tibial implant, size  4 posterior stabilized femoral component, and the size 41 patella are cemented in place and the patella is held with the clamp. The trial insert is placed and the knee held in full extension. The Exparel (20 ml mixed with 30 ml saline) and .25% Bupivicaine, are injected into the extensor mechanism, posterior capsule, medial and lateral gutters and subcutaneous tissues.  All extruded cement is removed and once the cement is hard the permanent 15 mm posterior stabilized rotating platform insert is placed into the tibial tray.      The wound is copiously irrigated with saline solution and the extensor mechanism closed over a hemovac drain with #1 PDS suture. The tourniquet is released for a total tourniquet time of 40  minutes. Flexion against gravity is 140 degrees and the patella tracks normally. Subcutaneous tissue is closed with 2.0 vicryl and subcuticular with running 4.0 Monocryl. The incision is cleaned and dried and steri-strips and a bulky sterile dressing are applied. The limb is placed into a knee immobilizer and the patient is awakened and transported to recovery in stable condition.      Please note that a surgical assistant was a medical necessity for this procedure in order to perform it in a safe and expeditious manner. Surgical assistant was necessary to retract the ligaments and vital neurovascular structures to prevent injury to them and also necessary for proper positioning of the limb to allow for anatomic placement of the prosthesis.   Dione Plover Kimala Horne, MD    12/01/2012, 1:01 PM

## 2012-12-01 NOTE — Transfer of Care (Signed)
Immediate Anesthesia Transfer of Care Note  Patient: Bradley [name]  Procedure(s) Performed: Procedure(s): LEFT TOTAL KNEE ARTHROPLASTY (Left)  Patient Location: PACU  Anesthesia Type:Spinal  Level of Consciousness: awake, alert  and patient cooperative  Airway & Oxygen Therapy: Patient Spontanous Breathing and Patient connected to face mask oxygen  Post-op Assessment: Report given to PACU RN and Post -op Vital signs reviewed and stable  Post vital signs: stable  Complications: No apparent anesthesia complications  L1 spinal level

## 2012-12-02 ENCOUNTER — Encounter (HOSPITAL_COMMUNITY): Payer: Self-pay | Admitting: Orthopedic Surgery

## 2012-12-02 DIAGNOSIS — D62 Acute posthemorrhagic anemia: Secondary | ICD-10-CM

## 2012-12-02 LAB — CBC
MCH: 26.5 pg (ref 26.0–34.0)
MCHC: 30.8 g/dL (ref 30.0–36.0)
Platelets: 151 10*3/uL (ref 150–400)

## 2012-12-02 LAB — BASIC METABOLIC PANEL
BUN: 27 mg/dL — ABNORMAL HIGH (ref 6–23)
CO2: 25 mEq/L (ref 19–32)
Calcium: 8.1 mg/dL — ABNORMAL LOW (ref 8.4–10.5)
Chloride: 105 mEq/L (ref 96–112)
Creatinine, Ser: 2.13 mg/dL — ABNORMAL HIGH (ref 0.50–1.35)
GFR calc Af Amer: 32 mL/min — ABNORMAL LOW (ref >90)
GFR calc non Af Amer: 27 mL/min — ABNORMAL LOW (ref >90)
Glucose, Bld: 132 mg/dL — ABNORMAL HIGH (ref 70–99)
Potassium: 4.7 mEq/L (ref 3.5–5.1)
Sodium: 138 mEq/L (ref 135–145)

## 2012-12-02 LAB — URINALYSIS, ROUTINE W REFLEX MICROSCOPIC
Nitrite: NEGATIVE
Specific Gravity, Urine: 1.032 — ABNORMAL HIGH (ref 1.005–1.030)
Urobilinogen, UA: 0.2 mg/dL (ref 0.0–1.0)
pH: 5 (ref 5.0–8.0)

## 2012-12-02 LAB — URINE MICROSCOPIC-ADD ON

## 2012-12-02 MED ORDER — NON FORMULARY: 20.0000 mg | Freq: Every morning | Status: DC

## 2012-12-02 MED ORDER — HYDROCHLOROTHIAZIDE 12.5 MG PO CAPS
12.5000 mg | ORAL_CAPSULE | Freq: Every day | ORAL | Status: DC
  Administered 2012-12-02 – 2012-12-07 (×6): 12.5 mg via ORAL
  Filled 2012-12-02 (×6): qty 1

## 2012-12-02 MED ORDER — ALUM & MAG HYDROXIDE-SIMETH 200-200-20 MG/5ML PO SUSP
30.0000 mL | ORAL | Status: DC | PRN
  Administered 2012-12-03 – 2012-12-05 (×3): 30 mL via ORAL
  Filled 2012-12-02 (×3): qty 30

## 2012-12-02 MED ORDER — OMEPRAZOLE 20 MG PO CPDR
20.0000 mg | DELAYED_RELEASE_CAPSULE | Freq: Every day | ORAL | Status: DC
  Administered 2012-12-02 – 2012-12-07 (×6): 20 mg via ORAL
  Filled 2012-12-02 (×6): qty 1

## 2012-12-02 NOTE — Progress Notes (Signed)
Subjective: 1 Day Post-Op Procedure(s) (LRB): LEFT TOTAL KNEE ARTHROPLASTY (Left) Patient reports pain as moderate.   Patient seen in rounds with Dr. Wynelle Link. Patient is having problems with pain in the knee, requiring pain medications.  Denies any CP or SOB. We will start therapy today.  Plan is to go Skilled nursing facility after hospital stay.  Objective: Vital signs in last 24 hours: Temp:  [94 F (34.4 C)-99.2 F (37.3 C)] 99.2 F (37.3 C) (05/06 0532) Pulse Rate:  [44-75] 70 (05/06 0532) Resp:  [12-21] 18 (05/06 0750) BP: (92-163)/(59-97) 99/68 mmHg (05/06 0532) SpO2:  [95 %-100 %] 99 % (05/06 0750) FiO2 (%):  [100 %] 100 % (05/05 1500) Weight:  [94.802 kg (209 lb)] 94.802 kg (209 lb) (05/05 1500)  Intake/Output from previous day:  Intake/Output Summary (Last 24 hours) at 12/02/12 0751 Last data filed at 12/02/12 0545  Gross per 24 hour  Intake 3793.33 ml  Output    885 ml  Net 2908.33 ml    Intake/Output this shift: UOP 200 since MN +2908  Labs:  Recent Labs  12/02/12 0500  HGB 8.2*    Recent Labs  12/02/12 0500  WBC 10.5  RBC 3.09*  HCT 26.6*  PLT 151    Recent Labs  12/02/12 0500  NA 138  K 4.7  CL 105  CO2 25  BUN 27*  CREATININE 2.13*  GLUCOSE 132*  CALCIUM 8.1*   No results found for this basename: LABPT, INR,  in the last 72 hours  EXAM General - Patient is Alert, Appropriate and Oriented Extremity - Neurovascular intact Sensation intact distally Dorsiflexion/Plantar flexion intact Dressing - dressing C/D/I Motor Function - intact, moving foot and toes well on exam.  Hemovac pulled without difficulty.  Past Medical History  Diagnosis Date  . Hypertension   . Cataract   . Syncope and collapse 10/15/2012    NEUROLOGIC EXAM - NO ACUTE FINDINGS - BUT "SIGNIFICANT ATHEROSCLEROTIC VASCULAR BURDEN BASED ON MRA OF BRAIN" - PER DISCHARGE SUMMARY 10/17/12 [place]  . Pneumonia 1980's?  Marland Kitchen GERD (gastroesophageal reflux disease)   .  UTI (urinary tract infection)     UTI DIAGNOSED AND TREATED WHEN PT HOSP FOR SYNCOPE 10/15/12  . Strabismus     RIGHT EYE  . BPH (benign prostatic hypertrophy)   . PPD positive     HX POSITIVE PPD WITH NEGATIVE CXR  . Varicosities of leg     LEFT  . Chronic kidney disease     CHRONIC RENAL INSUFFICIENCY - CREATININE 1.8 TO 1.9  . Dysrhythmia     "VENTRICULAR ECTOPY"  --NEGATIVE STRESS TEST FOR CORONARY ISCHEMIA, MARCH 2008 - PER OFFICE NOTES DR. R. N. GATES.  Marland Kitchen Arthritis     "knees" (10/15/2012)    Assessment/Plan: 1 Day Post-Op Procedure(s) (LRB): LEFT TOTAL KNEE ARTHROPLASTY (Left) Principal Problem:   OA (osteoarthritis) of knee Active Problems:   Postoperative anemia due to acute blood loss  Estimated body mass index is 27.58 kg/(m^2) as calculated from the following:   Height as of this encounter: 6\' 1"  (1.854 m).   Weight as of this encounter: 94.802 kg (209 lb). Advance diet Up with therapy Continue foley due to bladder outlet obstruction and urinary output monitoring Discharge to SNF when bed available.  DVT Prophylaxis - Lovenox for 10 days and then resume his full strength ASA 325 mg Weight-Bearing as tolerated to left leg No vaccines. D/C O2 and Pulse OX and try on Room Air Preop UTI -  will recheck and send cath specimen.  Adriano Bischof 12/02/2012, 7:51 AM

## 2012-12-02 NOTE — Progress Notes (Signed)
Utilization review completed.  

## 2012-12-02 NOTE — Progress Notes (Signed)
Physical Therapy Treatment Patient Details Name: Bradley [name] MRN: [id number] DOB: April 27, 1928 Today's Date: 12/02/2012 Time: [id number] PT Time Calculation (min): 16 min  PT Assessment / Plan / Recommendation Comments on Treatment Session  Pt  continues to slowly improve. decreased weight  tolerated on LLE.     Follow Up Recommendations  SNF     Does the patient have the potential to tolerate intense rehabilitation     Barriers to Discharge None      Equipment Recommendations  Rolling walker with 5" wheels    Recommendations for Other Services OT consult  Frequency 7X/week   Plan Discharge plan remains appropriate;Frequency remains appropriate    Precautions / Restrictions Precautions Precautions: Knee Required Braces or Orthoses: Knee Immobilizer - Left Knee Immobilizer - Left: Discontinue once straight leg raise with < 10 degree lag Restrictions Weight Bearing Restrictions: No Other Position/Activity Restrictions: WBAT   Pertinent Vitals/Pain 5 L knee. Has had meds.    Mobility  Bed Mobility Bed Mobility: Sit to Supine Sit to Supine: 3: Mod assist Details for Bed Mobility Assistance: assist LLE onto bed. Transfers Transfers: Stand Pivot Transfers Sit to Stand: 1: +2 Total assist;From chair/3-in-1 Sit to Stand: Patient Percentage: 60% Stand to Sit: 1: +2 Total assist;To bed Stand to Sit: Patient Percentage: 60% Stand Pivot Transfers: 1: +2 Total assist Stand Pivot Transfers: Patient Percentage: 60% Details for Transfer Assistance: cues to reach to bed, assist for LLE forward. pt able to turn and back up 5 steps to bed. AAssistive device: Rolling walker Ambulation/Gait Assistance Details: assist to steady, VCs sequencing  Gait Pattern: Step-to pattern;Decreased step length - right;Decreased step length - left;Trunk flexed    Exercises    PT Diagnosis: Difficulty walking;Acute pain  PT Problem List: Decreased strength;Decreased range of motion;Decreased  activity tolerance;Decreased mobility;Decreased knowledge of use of DME;Pain PT Treatment Interventions: DME instruction;Gait training;Functional mobility training;Therapeutic activities;Therapeutic exercise;Patient/family education   PT Goals Acute Rehab PT Goals PT Goal Formulation: With patient Time For Goal Achievement: 12/16/12 Potential to Achieve Goals: Good Pt will go Supine/Side to Sit: with mod assist PT Goal: Supine/Side to Sit - Progress: Goal set today Pt will go Sit to Stand: with mod assist PT Goal: Sit to Stand - Progress: Progressing toward goal Pt will Ambulate: 16 - 50 feet;with min assist;with rolling walker PT Goal: Ambulate - Progress: Progressing toward goal Pt will Perform Home Exercise Program: with min assist PT Goal: Perform Home Exercise Program - Progress: Goal set today  Visit Information  Last PT Received On: 12/02/12 Assistance Needed: +2    Subjective Data  Subjective: I thank you. It feels good to lie down. Patient Stated Goal: to walk   Cognition  Cognition Arousal/Alertness: Awake/alert Behavior During Therapy: WFL for tasks assessed/performed Overall Cognitive Status: Within Functional Limits for tasks assessed    Balance  Balance Balance Assessed: Yes Static Sitting Balance Static Sitting - Balance Support: Bilateral upper extremity supported;Feet supported Static Sitting - Level of Assistance: 5: Stand by assistance Static Sitting - Comment/# of Minutes: 3  End of Session PT - End of Session Equipment Utilized During Treatment: Left knee immobilizer Activity Tolerance: Patient limited by fatigue;Patient limited by pain Patient left: in bed;with call bell/phone within reach Nurse Communication: Mobility status CPM Left Knee CPM Left Knee: On   GP     Claretha Cooper 12/02/2012, 3:56 PM

## 2012-12-02 NOTE — Evaluation (Signed)
Physical Therapy Evaluation Patient Details Name: Bradley [name] MRN: [id number] DOB: 1928/01/03 Today's Date: 12/02/2012 Time: [id number] PT Time Calculation (min): 28 min  PT Assessment / Plan / Recommendation Clinical Impression  79 y.o. male admitted for L TKA. Bed to chair transfer with +2 assist today. SNF recommended. Pt will benefit from acute PT to maximize safety and  independence with mobility.     PT Assessment  Patient needs continued PT services    Follow Up Recommendations  SNF    Does the patient have the potential to tolerate intense rehabilitation      Barriers to Discharge None      Equipment Recommendations  Rolling walker with 5" wheels    Recommendations for Other Services OT consult   Frequency 7X/week    Precautions / Restrictions Precautions Precautions: Knee Restrictions Weight Bearing Restrictions: No Other Position/Activity Restrictions: WBAT   Pertinent Vitals/Pain *pt did not rate pain, but reported he "could feel it" in L knee with movement Ice applied**      Mobility  Bed Mobility Bed Mobility: Supine to Sit Supine to Sit: 1: +2 Total assist Supine to Sit: Patient Percentage: 40% Details for Bed Mobility Assistance: assist to elevate trunk and support LLE Transfers Transfers: Sit to Stand;Stand to Sit Sit to Stand: 1: +2 Total assist;From bed Sit to Stand: Patient Percentage: 40% Stand to Sit: 1: +2 Total assist;To chair/3-in-1 Stand to Sit: Patient Percentage: 50% Details for Transfer Assistance: assist to rise, to steady Ambulation/Gait Ambulation/Gait Assistance: 1: +2 Total assist Ambulation/Gait: Patient Percentage: 70% Ambulation Distance (Feet): 4 Feet Assistive device: Rolling walker Ambulation/Gait Assistance Details: assist to steady, VCs sequencing  Gait Pattern: Step-to pattern;Decreased step length - right;Decreased step length - left;Trunk flexed    Exercises Total Joint Exercises Ankle Circles/Pumps:  AROM;Both;10 reps Heel Slides: AAROM;Left;10 reps;Supine   PT Diagnosis: Difficulty walking;Acute pain  PT Problem List: Decreased strength;Decreased range of motion;Decreased activity tolerance;Decreased mobility;Decreased knowledge of use of DME;Pain PT Treatment Interventions: DME instruction;Gait training;Functional mobility training;Therapeutic activities;Therapeutic exercise;Patient/family education   PT Goals Acute Rehab PT Goals PT Goal Formulation: With patient Time For Goal Achievement: 12/16/12 Potential to Achieve Goals: Good Pt will go Supine/Side to Sit: with mod assist PT Goal: Supine/Side to Sit - Progress: Goal set today Pt will go Sit to Stand: with mod assist PT Goal: Sit to Stand - Progress: Goal set today Pt will Ambulate: 16 - 50 feet;with min assist;with rolling walker PT Goal: Ambulate - Progress: Goal set today Pt will Perform Home Exercise Program: with min assist PT Goal: Perform Home Exercise Program - Progress: Goal set today  Visit Information  Last PT Received On: 12/02/12 Assistance Needed: +2    Subjective Data  Subjective: 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  Patient Stated Goal: to walk   Prior Functioning  Home Living Lives With: Spouse Available Help at Discharge: [place]: Two level Home Adaptive Equipment: Straight cane Additional Comments: used cane PTA Prior Function Level of Independence: Independent with assistive device(s) Communication Communication: No difficulties    Cognition  Cognition Arousal/Alertness: Awake/alert Behavior During Therapy: WFL for tasks assessed/performed Overall Cognitive Status: Within Functional Limits for tasks assessed    Extremity/Trunk Assessment Right Upper Extremity Assessment RUE ROM/Strength/Tone: [facility] for tasks assessed Left Upper Extremity Assessment LUE ROM/Strength/Tone: WFL for tasks assessed Right Lower  Extremity Assessment RLE ROM/Strength/Tone: Within functional levels RLE Sensation: WFL - Light  Touch RLE Coordination: WFL - gross/fine motor Left Lower Extremity Assessment LLE ROM/Strength/Tone: Deficits LLE ROM/Strength/Tone Deficits: knee flexion AAROM 30*, ext -10*; hip 2/5 LLE Sensation: WFL - Light Touch LLE Coordination: WFL - gross/fine motor Trunk Assessment Trunk Assessment: Normal   Balance Balance Balance Assessed: Yes Static Sitting Balance Static Sitting - Balance Support: Bilateral upper extremity supported;Feet supported Static Sitting - Level of Assistance: 5: Stand by assistance Static Sitting - Comment/# of Minutes: 3  End of Session PT - End of Session Equipment Utilized During Treatment: Gait belt Activity Tolerance: Patient limited by fatigue;Patient limited by pain Patient left: in chair;with call bell/phone within reach Nurse Communication: Mobility status  GP     Philomena Doheny 12/02/2012, 12:37 PM [phone]

## 2012-12-02 NOTE — Progress Notes (Signed)
Clinical Social Work Department CLINICAL SOCIAL WORK PLACEMENT NOTE 12/02/2012  Patient:  Bradley [name], Bradley [name]  Account Number:  [id number] Admit date:  12/01/2012  Clinical Social Worker:  Werner Lean, LCSW  Date/time:  12/02/2012 12:44 PM  Clinical Social Work is seeking post-discharge placement for this patient at the following level of care:   SKILLED NURSING   (*CSW will update this form in Epic as items are completed)   12/02/2012  Patient/family provided with [facility] Department of Clinical Social Work's list of facilities offering this level of care within the geographic area requested by the patient (or if unable, by the patient's family).  12/02/2012  Patient/family informed of their freedom to choose among providers that offer the needed level of care, that participate in Medicare, Medicaid or managed care program needed by the patient, have an available bed and are willing to accept the patient.    Patient/family informed of [facility]' ownership interest in [facility], as well as of the fact that they are under no obligation to receive care at this facility.  PASARR submitted to EDS on 12/01/2012 PASARR number received from EDS on 12/01/2012  FL2 transmitted to all facilities in geographic area requested by pt/family on  12/02/2012 FL2 transmitted to all facilities within larger geographic area on   Patient informed that his/her managed care company has contracts with or will negotiate with  certain facilities, including the following:     Patient/family informed of bed offers received:   Patient chooses bed at  Physician recommends and patient chooses bed at    Patient to be transferred to  on   Patient to be transferred to facility by   The following physician request were entered in Epic:   Additional Comments:   Werner Lean LCSW [phone]

## 2012-12-02 NOTE — Progress Notes (Signed)
Clinical Social Work Department BRIEF PSYCHOSOCIAL ASSESSMENT 12/02/2012  Patient:  Bradley [name], Bradley [name]     Account Number:  [id number]     Admit date:  12/01/2012  Clinical Social Worker:  Lacie Scotts  Date/Time:  12/02/2012 12:28 PM  Referred by:  Physician  Date Referred:  12/02/2012 Referred for  SNF Placement   Other Referral:   Interview type:  Patient Other interview type:    PSYCHOSOCIAL DATA Living Status:  WIFE Admitted from facility:   Level of care:   Primary support name:  Catheryn Primary support relationship to patient:  SPOUSE Degree of support available:   supportive    CURRENT CONCERNS Current Concerns  Post-Acute Placement   Other Concerns:    SOCIAL WORK ASSESSMENT / PLAN Pt is an 79 yr old gentleman living at home prior to hospitalization. CSW met with pt to assist with d/c planning. [facility] will be needed following hospital d/c. SNF search has been initiated and bed offers will be provided this afternoon. CSW will continue to follow to assist with d/c planning to SNF.   Assessment/plan status:  Psychosocial Support/Ongoing Assessment of Needs Other assessment/ plan:   Information/referral to community resources:   SNF list with bed offers to be provided.    PATIENT'S/FAMILY'S RESPONSE TO PLAN OF CARE: Pt feels short term rehab is needed prior to returning home. CSW will meet with pt / spouse this afternoon to review bed offers.    Werner Lean LCSW [phone]

## 2012-12-03 DIAGNOSIS — N390 Urinary tract infection, site not specified: Secondary | ICD-10-CM

## 2012-12-03 DIAGNOSIS — E871 Hypo-osmolality and hyponatremia: Secondary | ICD-10-CM

## 2012-12-03 DIAGNOSIS — Z9289 Personal history of other medical treatment: Secondary | ICD-10-CM

## 2012-12-03 LAB — BASIC METABOLIC PANEL
CO2: 26 mEq/L (ref 19–32)
Chloride: 100 mEq/L (ref 96–112)
Potassium: 4.7 mEq/L (ref 3.5–5.1)
Sodium: 133 mEq/L — ABNORMAL LOW (ref 135–145)

## 2012-12-03 LAB — PREPARE RBC (CROSSMATCH)

## 2012-12-03 LAB — CBC
Platelets: 145 10*3/uL — ABNORMAL LOW (ref 150–400)
RBC: 2.62 MIL/uL — ABNORMAL LOW (ref 4.22–5.81)
WBC: 10.7 10*3/uL — ABNORMAL HIGH (ref 4.0–10.5)

## 2012-12-03 LAB — URINE CULTURE

## 2012-12-03 MED ORDER — DEXTROSE 5 % IV SOLN
1.0000 g | Freq: Two times a day (BID) | INTRAVENOUS | Status: DC
  Administered 2012-12-03 – 2012-12-05 (×5): 1 g via INTRAVENOUS
  Filled 2012-12-03 (×8): qty 10

## 2012-12-03 MED ORDER — BISACODYL 10 MG RE SUPP
10.0000 mg | Freq: Once | RECTAL | Status: AC
  Administered 2012-12-03: 10 mg via RECTAL

## 2012-12-03 MED ORDER — ACETAMINOPHEN 325 MG PO TABS
650.0000 mg | ORAL_TABLET | Freq: Once | ORAL | Status: AC
  Administered 2012-12-03: 650 mg via ORAL
  Filled 2012-12-03: qty 1

## 2012-12-03 MED ORDER — FUROSEMIDE 10 MG/ML IJ SOLN
20.0000 mg | Freq: Once | INTRAMUSCULAR | Status: AC
  Administered 2012-12-03: 20 mg via INTRAVENOUS
  Filled 2012-12-03 (×2): qty 2

## 2012-12-03 NOTE — Progress Notes (Signed)
Physical Therapy Treatment Patient Details Name: KENYETTA GROSECLOSE MRN: [id number] DOB: [birth year] Today's Date: 12/03/2012 Time: [id number] PT Time Calculation (min): 23 min  PT Assessment / Plan / Recommendation Comments on Treatment Session       Follow Up Recommendations  SNF     Does the patient have the potential to tolerate intense rehabilitation     Barriers to Discharge        Equipment Recommendations  Rolling walker with 5" wheels    Recommendations for Other Services OT consult  Frequency 7X/week   Plan Discharge plan remains appropriate;Frequency remains appropriate    Precautions / Restrictions Precautions Precautions: Knee Required Braces or Orthoses: Knee Immobilizer - Left Knee Immobilizer - Left: Discontinue once straight leg raise with < 10 degree lag Restrictions Weight Bearing Restrictions: No Other Position/Activity Restrictions: WBAT   Pertinent Vitals/Pain     Mobility  Bed Mobility Bed Mobility: Sit to Supine Sit to Supine: 3: Mod assist Details for Bed Mobility Assistance: cues for sequence and use of R LE to self assist Transfers Transfers: Sit to Stand;Stand to Sit Sit to Stand: 1: +2 Total assist;With upper extremity assist;From chair/3-in-1 Sit to Stand: Patient Percentage: 60% Stand to Sit: 1: +2 Total assist;With upper extremity assist;To bed Stand to Sit: Patient Percentage: 70% Details for Transfer Assistance: cues to reach to bed, assist for LLE forward.  Ambulation/Gait Ambulation/Gait Assistance: 1: +2 Total assist Ambulation/Gait: Patient Percentage: 70% Ambulation Distance (Feet): 25 Feet Assistive device: Rolling walker Ambulation/Gait Assistance Details: cues for sequence, posture, stride length, and position from RW Gait Pattern: Step-to pattern;Decreased step length - right;Decreased step length - left;Trunk flexed Stairs: No    Exercises     PT Diagnosis:    PT Problem List:   PT Treatment Interventions:     PT  Goals Acute Rehab PT Goals PT Goal Formulation: With patient Time For Goal Achievement: 12/16/12 Potential to Achieve Goals: Good Pt will go Supine/Side to Sit: with mod assist PT Goal: Supine/Side to Sit - Progress: Progressing toward goal Pt will go Sit to Stand: with mod assist PT Goal: Sit to Stand - Progress: Progressing toward goal Pt will Ambulate: 16 - 50 feet;with min assist;with rolling walker PT Goal: Ambulate - Progress: Progressing toward goal Pt will Perform Home Exercise Program: with min assist PT Goal: Perform Home Exercise Program - Progress: Progressing toward goal  Visit Information  Last PT Received On: 12/03/12 Assistance Needed: +2    Subjective Data  Subjective: I am ready for you if you are ready for me Patient Stated Goal: to walk   Cognition  Cognition Arousal/Alertness: Awake/alert Behavior During Therapy: WFL for tasks assessed/performed Overall Cognitive Status: Within Functional Limits for tasks assessed    Balance     End of Session PT - End of Session Equipment Utilized During Treatment: Left knee immobilizer;Gait belt Activity Tolerance: Patient limited by fatigue;Patient tolerated treatment well Patient left: in bed;with call bell/phone within reach;with family/visitor present Nurse Communication: Mobility status CPM Left Knee CPM Left Knee: On   GP     Gazella Anglin 12/03/2012, 5:12 PM

## 2012-12-03 NOTE — Progress Notes (Signed)
OT Cancellation Note  Patient Details Name: Bradley [name] MRN: [id number] DOB: Nov 04, 1927   Cancelled Treatment:    Reason Eval/Treat Not Completed: Medical issues which prohibited therapy;Other (comment) (pt to get blood, RN prefer OOB wait till after blood)  [place], Thereasa Parkin 12/03/2012, 8:50 AM

## 2012-12-03 NOTE — Progress Notes (Signed)
Physical Therapy Treatment Patient Details Name: Bradley [name] MRN: [id number] DOB: 1927/10/06 Today's Date: 12/03/2012 Time: 1205-1220 PT Time Calculation (min): 15 min  PT Assessment / Plan / Recommendation Comments on Treatment Session  Will progress to OOB with completion of first unit of transfusion    Follow Up Recommendations  SNF     Does the patient have the potential to tolerate intense rehabilitation     Barriers to Discharge        Equipment Recommendations  Rolling walker with 5" wheels    Recommendations for Other Services OT consult  Frequency 7X/week   Plan Discharge plan remains appropriate;Frequency remains appropriate    Precautions / Restrictions Precautions Precautions: Knee Required Braces or Orthoses: Knee Immobilizer - Left Knee Immobilizer - Left: Discontinue once straight leg raise with < 10 degree lag Restrictions Weight Bearing Restrictions: No Other Position/Activity Restrictions: WBAT   Pertinent Vitals/Pain 5/10; cold packs provided    Mobility  Bed Mobility Bed Mobility: Supine to Sit Supine to Sit: 1: +2 Total assist Supine to Sit: Patient Percentage: 60% Details for Bed Mobility Assistance: cues for sequence and use of R LE to self assist Transfers Transfers: Sit to Stand;Stand to Sit Sit to Stand: 1: +2 Total assist;From bed;With upper extremity assist Sit to Stand: Patient Percentage: 60% Stand to Sit: 1: +2 Total assist;To chair/3-in-1;With upper extremity assist Stand to Sit: Patient Percentage: 60% Details for Transfer Assistance: cues to reach to bed, assist for LLE forward.  Ambulation/Gait Ambulation/Gait Assistance: 1: +2 Total assist Ambulation/Gait: Patient Percentage: 70% Ambulation Distance (Feet): 22 Feet Assistive device: Rolling walker Ambulation/Gait Assistance Details: cues for sequence, posture, position from RW and stride length Gait Pattern: Step-to pattern;Decreased step length - right;Decreased step  length - left;Trunk flexed Stairs: No    Exercises Total Joint Exercises Ankle Circles/Pumps: AROM;Both;15 reps Quad Sets: AROM;Supine;Both;20 reps Heel Slides: AAROM;Left;Supine;20 reps Straight Leg Raises: AAROM;20 reps;Supine;Left   PT Diagnosis:    PT Problem List:   PT Treatment Interventions:     PT Goals Acute Rehab PT Goals PT Goal Formulation: With patient Time For Goal Achievement: 12/16/12 Potential to Achieve Goals: Good Pt will go Supine/Side to Sit: with mod assist PT Goal: Supine/Side to Sit - Progress: Goal set today Pt will go Sit to Stand: with mod assist PT Goal: Sit to Stand - Progress: Goal set today Pt will Ambulate: 16 - 50 feet;with min assist;with rolling walker PT Goal: Ambulate - Progress: Goal set today Pt will Perform Home Exercise Program: with min assist PT Goal: Perform Home Exercise Program - Progress: Goal set today  Visit Information  Last PT Received On: 12/03/12 Assistance Needed: +2    Subjective Data  Subjective: Are you going to get me up Patient Stated Goal: to walk   Cognition  Cognition Arousal/Alertness: Awake/alert Behavior During Therapy: WFL for tasks assessed/performed Overall Cognitive Status: Within Functional Limits for tasks assessed    Balance  Static Sitting Balance Static Sitting - Balance Support: Bilateral upper extremity supported;Feet supported Static Sitting - Level of Assistance: 5: Stand by assistance Static Sitting - Comment/# of Minutes: 4  End of Session PT - End of Session Equipment Utilized During Treatment: Left knee immobilizer Activity Tolerance: Patient limited by fatigue Patient left: in chair;with call bell/phone within reach Nurse Communication: Mobility status   GP     Melodie Ashworth 12/03/2012, 1:26 PM

## 2012-12-03 NOTE — Care Management Note (Signed)
    Page 1 of 1   12/03/2012     7:51:56 PM   CARE MANAGEMENT NOTE 12/03/2012  Patient:  PERCIVAL, KURATA A   Account Number:  [id number]  Date Initiated:  12/03/2012  Documentation initiated by:  Sherrin Daisy  Subjective/Objective Assessment:   dx total knee replacemnt     Action/Plan:   SNF rehab upon discharge   Anticipated DC Date:  12/04/2012   Anticipated DC Plan:  [place]  In-house referral  Clinical Social Worker      DC Planning Services  CM consult      Choice offered to / List presented to:             Status of service:  Completed, signed off Medicare Important Message given?  NA - LOS <3 / Initial given by admissions (If response is "NO", the following Medicare IM given date fields will be blank) Date Medicare IM given:   Date Additional Medicare IM given:    Discharge Disposition:    Per UR Regulation:    If discussed at Long Length of Stay Meetings, dates discussed:    Comments:

## 2012-12-03 NOTE — Progress Notes (Signed)
Physical Therapy Treatment Patient Details Name: Bradley [name] MRN: [id number] DOB: 1927/09/12 Today's Date: 12/03/2012 Time: [id number] PT Time Calculation (min): 24 min  PT Assessment / Plan / Recommendation Comments on Treatment Session  Will progress to OOB with completion of first unit of transfusion    Follow Up Recommendations  SNF     Does the patient have the potential to tolerate intense rehabilitation     Barriers to Discharge        Equipment Recommendations  Rolling walker with 5" wheels    Recommendations for Other Services OT consult  Frequency 7X/week   Plan Discharge plan remains appropriate;Frequency remains appropriate    Precautions / Restrictions Precautions Precautions: Knee Required Braces or Orthoses: Knee Immobilizer - Left Knee Immobilizer - Left: Discontinue once straight leg raise with < 10 degree lag Restrictions Weight Bearing Restrictions: No Other Position/Activity Restrictions: WBAT   Pertinent Vitals/Pain     Mobility  Bed Mobility Bed Mobility:  (Deferred - Blood transfusion in progress )    Exercises Total Joint Exercises Ankle Circles/Pumps: AROM;Both;15 reps Quad Sets: AROM;Supine;Both;20 reps Heel Slides: AAROM;Left;Supine;20 reps Straight Leg Raises: AAROM;20 reps;Supine;Left   PT Diagnosis:    PT Problem List:   PT Treatment Interventions:     PT Goals Acute Rehab PT Goals PT Goal Formulation: With patient Pt will Perform Home Exercise Program: with min assist PT Goal: Perform Home Exercise Program - Progress: Progressing toward goal  Visit Information  Last PT Received On: 12/03/12 Assistance Needed: +1    Subjective Data  Patient Stated Goal: to walk   Cognition  Cognition Arousal/Alertness: Awake/alert Behavior During Therapy: WFL for tasks assessed/performed Overall Cognitive Status: Within Functional Limits for tasks assessed    Balance     End of Session PT - End of Session Activity Tolerance:  Patient limited by fatigue;Patient limited by pain Patient left: in bed;with call bell/phone within reach   GP     [name] 12/03/2012, 1:19 PM

## 2012-12-03 NOTE — Progress Notes (Addendum)
Subjective: 2 Days Post-Op Procedure(s) (LRB): LEFT TOTAL KNEE ARTHROPLASTY (Left) Patient reports pain as mild and moderate.   Patient seen in rounds with Dr. Wynelle Link. Patient is well, but has had some minor complaints of constipation and pain in the knee, requiring pain medications.   He also had some belching and nausea last night. Plan is to go Skilled nursing facility after hospital stay. HGB is down to 7.0.  Will give two units of blood and lasix between units. Rechecked his urine and found to have chronic UTI.  Will start on IV Rocephin Q 12 hours.  Objective: Vital signs in last 24 hours: Temp:  [97.5 F (36.4 C)-98.5 F (36.9 C)] 98.5 F (36.9 C) (05/07 0930) Pulse Rate:  [73-77] 76 (05/07 0930) Resp:  [16-18] 18 (05/07 0930) BP: (128-155)/(67-88) 136/75 mmHg (05/07 0930) SpO2:  [94 %-98 %] 95 % (05/07 0453)  Intake/Output from previous day:  Intake/Output Summary (Last 24 hours) at 12/03/12 1052 Last data filed at 12/03/12 1005  Gross per 24 hour  Intake   1640 ml  Output    850 ml  Net    790 ml    Intake/Output this shift: Total I/O In: -  Out: 100 [Urine:100]  Labs:  Recent Labs  12/02/12 0500 12/03/12 0516  HGB 8.2* 7.0*    Recent Labs  12/02/12 0500 12/03/12 0516  WBC 10.5 10.7*  RBC 3.09* 2.62*  HCT 26.6* 22.4*  PLT 151 145*    Recent Labs  12/02/12 0500 12/03/12 0516  NA 138 133*  K 4.7 4.7  CL 105 100  CO2 25 26  BUN 27* 38*  CREATININE 2.13* 2.20*  GLUCOSE 132* 138*  CALCIUM 8.1* 8.0*   No results found for this basename: LABPT, INR,  in the last 72 hours  EXAM General - Patient is Alert, Appropriate and Oriented Extremity - Neurovascular intact Sensation intact distally Dorsiflexion/Plantar flexion intact Dressing/Incision - clean, dry, no drainage, healing Motor Function - intact, moving foot and toes well on exam.   Past Medical History  Diagnosis Date  . Hypertension   . Cataract   . Syncope and collapse  10/15/2012    NEUROLOGIC EXAM - NO ACUTE FINDINGS - BUT "SIGNIFICANT ATHEROSCLEROTIC VASCULAR BURDEN BASED ON MRA OF BRAIN" - PER DISCHARGE SUMMARY 10/17/12 [place]  . Pneumonia 1980's?  Marland Kitchen GERD (gastroesophageal reflux disease)   . UTI (urinary tract infection)     UTI DIAGNOSED AND TREATED WHEN PT HOSP FOR SYNCOPE 10/15/12  . Strabismus     RIGHT EYE  . BPH (benign prostatic hypertrophy)   . PPD positive     HX POSITIVE PPD WITH NEGATIVE CXR  . Varicosities of leg     LEFT  . Chronic kidney disease     CHRONIC RENAL INSUFFICIENCY - CREATININE 1.8 TO 1.9  . Dysrhythmia     "VENTRICULAR ECTOPY"  --NEGATIVE STRESS TEST FOR CORONARY ISCHEMIA, MARCH 2008 - PER OFFICE NOTES DR. R. N. GATES.  Marland Kitchen Arthritis     "knees" (10/15/2012)    Assessment/Plan: 2 Days Post-Op Procedure(s) (LRB): LEFT TOTAL KNEE ARTHROPLASTY (Left) Principal Problem:   OA (osteoarthritis) of knee Active Problems:   Postoperative anemia due to acute blood loss   Postop Hyponatremia   UTI (urinary tract infection)   Postop Transfusion  Estimated body mass index is 27.58 kg/(m^2) as calculated from the following:   Height as of this encounter: 6\' 1"  (1.854 m).   Weight as of this encounter: 94.802 kg (  209 lb). Up with therapy Discharge to SNF Start Rocephin IV due to his chronic UTI.  DVT Prophylaxis - Lovenox for 10 days and then resume his full strength ASA 325 mg Weight-Bearing as tolerated to left leg  PERKINS, ALEXZANDREW 12/03/2012, 10:52 AM

## 2012-12-04 LAB — CBC
HCT: 22.5 % — ABNORMAL LOW (ref 39.0–52.0)
Hemoglobin: 7.3 g/dL — ABNORMAL LOW (ref 13.0–17.0)
RBC: 2.69 MIL/uL — ABNORMAL LOW (ref 4.22–5.81)
RDW: 14 % (ref 11.5–15.5)
WBC: 8.9 10*3/uL (ref 4.0–10.5)

## 2012-12-04 LAB — BASIC METABOLIC PANEL
BUN: 43 mg/dL — ABNORMAL HIGH (ref 6–23)
Chloride: 98 mEq/L (ref 96–112)
GFR calc Af Amer: 33 mL/min — ABNORMAL LOW (ref >90)
Glucose, Bld: 111 mg/dL — ABNORMAL HIGH (ref 70–99)
Potassium: 4.5 mEq/L (ref 3.5–5.1)

## 2012-12-04 MED ORDER — CYCLOBENZAPRINE HCL 10 MG PO TABS
10.0000 mg | ORAL_TABLET | Freq: Three times a day (TID) | ORAL | Status: DC | PRN
  Administered 2012-12-05: 10 mg via ORAL
  Filled 2012-12-04 (×3): qty 1

## 2012-12-04 MED ORDER — HYDROCODONE-ACETAMINOPHEN 5-325 MG PO TABS
1.0000 | ORAL_TABLET | ORAL | Status: DC | PRN
  Administered 2012-12-05 (×2): 1 via ORAL
  Filled 2012-12-04 (×2): qty 1

## 2012-12-04 MED ORDER — DIPHENHYDRAMINE HCL 25 MG PO CAPS
25.0000 mg | ORAL_CAPSULE | Freq: Once | ORAL | Status: AC
  Administered 2012-12-04: 25 mg via ORAL
  Filled 2012-12-04: qty 1

## 2012-12-04 NOTE — Progress Notes (Signed)
Subjective: 3 Days Post-Op Procedure(s) (LRB): LEFT TOTAL KNEE ARTHROPLASTY (Left) Patient reports pain as mild and moderate.   Patient seen in rounds by Dr. Wynelle Link.  He continues with hiccups.  He received blood yesterday but HGB did not bump.  Will recheck labs and monitor for symptoms.  Know hx of reflux. Started on IV ABX yesterday due to chronic UTI.  Monitor bowels.  Will guaiac stool and emesis if occurs.  Meds changed by Dr. Wynelle Link.  Will keep in house today for further monitoring.  He is not ready for any rehab today.  Recheck labs in the morning. Patient is having problems with hiccups.  Monitor for further GI symptoms. Plan is to go Skilled nursing facility after hospital stay.  Objective: Vital signs in last 24 hours: Temp:  [98.2 F (36.8 C)-99 F (37.2 C)] 98.8 F (37.1 C) (05/08 0525) Pulse Rate:  [62-98] 62 (05/08 0525) Resp:  [16-20] 18 (05/08 0525) BP: (105-148)/(67-78) 120/72 mmHg (05/08 0525) SpO2:  [98 %-99 %] 99 % (05/08 0525)  Intake/Output from previous day:  Intake/Output Summary (Last 24 hours) at 12/04/12 0731 Last data filed at 12/04/12 0553  Gross per 24 hour  Intake   2380 ml  Output   1595 ml  Net    785 ml    Intake/Output this shift: UOP 570 since MN +5147  Labs:  Recent Labs  12/02/12 0500 12/03/12 0516 12/04/12 0500  HGB 8.2* 7.0* 7.3*    Recent Labs  12/03/12 0516 12/04/12 0500  WBC 10.7* 8.9  RBC 2.62* 2.69*  HCT 22.4* 22.5*  PLT 145* 141*    Recent Labs  12/03/12 0516 12/04/12 0500  NA 133* 131*  K 4.7 4.5  CL 100 98  CO2 26 25  BUN 38* 43*  CREATININE 2.20* 2.05*  GLUCOSE 138* 111*  CALCIUM 8.0* 7.8*   No results found for this basename: LABPT, INR,  in the last 72 hours  EXAM General - Patient is Alert, Appropriate and Oriented Extremity - Neurovascular intact Sensation intact distally Dorsiflexion/Plantar flexion intact No cellulitis present Dressing/Incision - clean, dry, no drainage,  healing Motor Function - intact, moving foot and toes well on exam.   Past Medical History  Diagnosis Date  . Hypertension   . Cataract   . Syncope and collapse 10/15/2012    NEUROLOGIC EXAM - NO ACUTE FINDINGS - BUT "SIGNIFICANT ATHEROSCLEROTIC VASCULAR BURDEN BASED ON MRA OF BRAIN" - PER DISCHARGE SUMMARY 10/17/12 [place]  . Pneumonia 1980's?  Marland Kitchen GERD (gastroesophageal reflux disease)   . UTI (urinary tract infection)     UTI DIAGNOSED AND TREATED WHEN PT HOSP FOR SYNCOPE 10/15/12  . Strabismus     RIGHT EYE  . BPH (benign prostatic hypertrophy)   . PPD positive     HX POSITIVE PPD WITH NEGATIVE CXR  . Varicosities of leg     LEFT  . Chronic kidney disease     CHRONIC RENAL INSUFFICIENCY - CREATININE 1.8 TO 1.9  . Dysrhythmia     "VENTRICULAR ECTOPY"  --NEGATIVE STRESS TEST FOR CORONARY ISCHEMIA, MARCH 2008 - PER OFFICE NOTES DR. R. N. GATES.  Marland Kitchen Arthritis     "knees" (10/15/2012)    Assessment/Plan: 3 Days Post-Op Procedure(s) (LRB): LEFT TOTAL KNEE ARTHROPLASTY (Left) Principal Problem:   OA (osteoarthritis) of knee Active Problems:   Postoperative anemia due to acute blood loss   Postop Hyponatremia   UTI (urinary tract infection)   Postop Transfusion  Estimated body mass index  is 27.58 kg/(m^2) as calculated from the following:   Height as of this encounter: 6\' 1"  (1.854 m).   Weight as of this encounter: 94.802 kg (209 lb). Up with therapy Plan for discharge tomorrow Discharge to SNF  DVT Prophylaxis - Lovenox for 10 days and then resume his full strength ASA 325 mg Weight-Bearing as tolerated to left leg Labs in AM Guaiac stool and emesis if occurs.  PERKINS, ALEXZANDREW 12/04/2012, 7:31 AM

## 2012-12-04 NOTE — Progress Notes (Signed)
Physical Therapy Treatment Patient Details Name: Bradley [name] MRN: [id number] DOB: 1928/06/01 Today's Date: 12/04/2012 Time: [id number] PT Time Calculation (min): 27 min  PT Assessment / Plan / Recommendation Comments on Treatment Session  POD #3 L TKR pm session due to completion of first unit of blood. Assisted pt OOB to amb limited distance + 2 assist on 2 lts O2 sats avg 88%.  Pt progessing slowly and plans to D/C to SNF for ST Rehab.    Follow Up Recommendations  SNF     Does the patient have the potential to tolerate intense rehabilitation     Barriers to Discharge        Equipment Recommendations       Recommendations for Other Services    Frequency     Plan Discharge plan remains appropriate;Frequency remains appropriate    Precautions / Restrictions Precautions Precautions: Knee Precaution Comments: Instructed family on KI use for amb Required Braces or Orthoses: Knee Immobilizer - Left Knee Immobilizer - Left: Discontinue once straight leg raise with < 10 degree lag Restrictions Weight Bearing Restrictions: No Other Position/Activity Restrictions: WBAT   Pertinent Vitals/Pain C/o "alot" L knee pain during gait ICE applied    Mobility  Bed Mobility Bed Mobility: Sit to Supine Supine to Sit: 1: +2 Total assist Supine to Sit: Patient Percentage: 60% Details for Bed Mobility Assistance: cues for sequence and use of R LE to self assist and increased time with posterior LOB Transfers Transfers: Sit to Stand;Stand to Sit Sit to Stand: 1: +2 Total assist;With upper extremity assist;From bed Stand to Sit: 1: +2 Total assist;With upper extremity assist;To chair/3-in-1 Details for Transfer Assistance: 50% VC's on proper tech and hand placement with difficulty correcting balance/posture Ambulation/Gait Ambulation/Gait Assistance: 1: +2 Total assist Ambulation/Gait: Patient Percentage: 70% Ambulation Distance (Feet): 28 Feet Ambulation/Gait Assistance Details: 50%  VCV's on upright posture and sequencing.  Very unsteady gait with 3rd assist following with chair. Amb on 2 lts O2 sats avg 88% with HR increasing to 138. Gait Pattern: Step-to pattern;Decreased step length - right;Decreased step length - left;Trunk flexed    PT Goals                                                    progressing    Visit Information  Last PT Received On: 12/04/12 Assistance Needed: +2    Subjective Data      Cognition    fair   Balance   poor  End of Session PT - End of Session Equipment Utilized During Treatment: Left knee immobilizer;Gait belt;Oxygen Activity Tolerance: Patient limited by fatigue;Patient tolerated treatment well Patient left: in chair;with call bell/phone within reach;with family/visitor present   Rica Koyanagi  PTA [facility]  Acute  Rehab Pager      [phone]

## 2012-12-04 NOTE — Progress Notes (Signed)
PT Cancellation Note  _X_AM Treatment cancelled today due to medical issues with patient which prohibited therapy.........Marland Kitchenpt receiving blood  ___ Treatment cancelled today due to patient receiving procedure or test   ___ Treatment cancelled today due to patient's refusal to participate   ___ Treatment cancelled today due to   Rica Koyanagi  PTA [facility]  Acute  Rehab Pager      [phone]

## 2012-12-05 ENCOUNTER — Inpatient Hospital Stay (HOSPITAL_COMMUNITY): Payer: Medicare Other

## 2012-12-05 LAB — TYPE AND SCREEN
DAT, IgG: POSITIVE
Unit division: 0
Unit division: 0
Unit division: 0

## 2012-12-05 LAB — CBC
Hemoglobin: 8.6 g/dL — ABNORMAL LOW (ref 13.0–17.0)
MCH: 27.7 pg (ref 26.0–34.0)
MCHC: 33 g/dL (ref 30.0–36.0)

## 2012-12-05 LAB — BASIC METABOLIC PANEL
BUN: 38 mg/dL — ABNORMAL HIGH (ref 6–23)
Calcium: 8.1 mg/dL — ABNORMAL LOW (ref 8.4–10.5)
GFR calc non Af Amer: 32 mL/min — ABNORMAL LOW (ref >90)
Glucose, Bld: 97 mg/dL (ref 70–99)
Sodium: 134 mEq/L — ABNORMAL LOW (ref 135–145)

## 2012-12-05 MED ORDER — FLEET ENEMA 7-19 GM/118ML RE ENEM: 1.0000 | ENEMA | Freq: Every day | RECTAL | Status: DC | PRN

## 2012-12-05 MED ORDER — METOCLOPRAMIDE HCL 5 MG PO TABS: 5.0000 mg | ORAL_TABLET | Freq: Three times a day (TID) | ORAL | Status: DC | PRN

## 2012-12-05 MED ORDER — DIPHENHYDRAMINE HCL 12.5 MG/5ML PO ELIX: 12.5000 mg | ORAL_SOLUTION | ORAL | Status: DC | PRN

## 2012-12-05 MED ORDER — ONDANSETRON HCL 4 MG PO TABS: 4.0000 mg | ORAL_TABLET | Freq: Four times a day (QID) | ORAL | Status: DC | PRN

## 2012-12-05 MED ORDER — ACETAMINOPHEN 325 MG PO TABS: 650.0000 mg | ORAL_TABLET | Freq: Four times a day (QID) | ORAL | Status: DC | PRN

## 2012-12-05 MED ORDER — ALUM & MAG HYDROXIDE-SIMETH 200-200-20 MG/5ML PO SUSP: 30.0000 mL | ORAL | Status: DC | PRN

## 2012-12-05 MED ORDER — POLYETHYLENE GLYCOL 3350 17 G PO PACK: 17.0000 g | PACK | Freq: Every day | ORAL | Status: DC

## 2012-12-05 MED ORDER — GLYCERIN (LAXATIVE) 2.1 G RE SUPP
1.0000 | Freq: Once | RECTAL | Status: AC
  Administered 2012-12-05: 1 via RECTAL
  Filled 2012-12-05: qty 1

## 2012-12-05 MED ORDER — FLEET ENEMA 7-19 GM/118ML RE ENEM
1.0000 | ENEMA | Freq: Once | RECTAL | Status: AC
  Administered 2012-12-05: 1 via RECTAL
  Filled 2012-12-05: qty 1

## 2012-12-05 MED ORDER — HYDROCODONE-ACETAMINOPHEN 5-325 MG PO TABS: 1.0000 | ORAL_TABLET | ORAL | Status: DC | PRN

## 2012-12-05 MED ORDER — CYCLOBENZAPRINE HCL 10 MG PO TABS: 10.0000 mg | ORAL_TABLET | Freq: Three times a day (TID) | ORAL | Status: DC | PRN

## 2012-12-05 MED ORDER — BISACODYL 10 MG RE SUPP: 10.0000 mg | Freq: Every day | RECTAL | Status: DC | PRN

## 2012-12-05 MED ORDER — TRAMADOL HCL 50 MG PO TABS: 50.0000 mg | ORAL_TABLET | Freq: Four times a day (QID) | ORAL | Status: DC | PRN

## 2012-12-05 MED ORDER — DSS 100 MG PO CAPS: 100.0000 mg | ORAL_CAPSULE | Freq: Two times a day (BID) | ORAL | Status: DC

## 2012-12-05 MED ORDER — ENOXAPARIN SODIUM 30 MG/0.3ML ~~LOC~~ SOLN: 30.0000 mg | Freq: Two times a day (BID) | SUBCUTANEOUS | Status: DC

## 2012-12-05 MED ORDER — METOCLOPRAMIDE HCL 5 MG/ML IJ SOLN
5.0000 mg | Freq: Once | INTRAMUSCULAR | Status: AC
  Administered 2012-12-05: 5 mg via INTRAVENOUS
  Filled 2012-12-05: qty 2

## 2012-12-05 NOTE — Evaluation (Signed)
Occupational Therapy Evaluation Patient Details Name: Bradley [name] MRN: [id number] DOB: [birth year] Today's Date: 12/05/2012 Time: [id number] OT Time Calculation (min): 39 min  OT Assessment / Plan / Recommendation Clinical Impression  This 79 year old man was admitted for L TKA.  He is planning SNF and all OT needs can be met in that venue.  Pt needs A x 2 for safety    OT Assessment  All further OT needs can be met in the next venue of care    Follow Up Recommendations  SNF    Barriers to Discharge      Equipment Recommendations  None recommended by OT    Recommendations for Other Services    Frequency       Precautions / Restrictions Precautions Precautions: Knee Required Braces or Orthoses: Knee Immobilizer - Left Knee Immobilizer - Left: Discontinue once straight leg raise with < 10 degree lag Restrictions Weight Bearing Restrictions: No Other Position/Activity Restrictions: WBAT   Pertinent Vitals/Pain Did not rate pain in LLE  But nonverbals indicated pain in leg; repositioned and ice applied    ADL  Grooming: Set up Where Assessed - Grooming: Unsupported sitting Upper Body Bathing: Set up Where Assessed - Upper Body Bathing: Unsupported sitting Lower Body Bathing: +2 Total assistance Lower Body Bathing: Patient Percentage: 70% Where Assessed - Lower Body Bathing: Supported sit to stand Upper Body Dressing: Minimal assistance Where Assessed - Upper Body Dressing: Unsupported sitting Lower Body Dressing: +2 Total assistance Lower Body Dressing: Patient Percentage: 40% Where Assessed - Lower Body Dressing: Supported sit to stand Toilet Transfer: Simulated;+2 Total assistance Toilet Transfer: Patient Percentage: 60% Armed forces technical officer Method: Sit to Loss adjuster, chartered:  (bed, ambulated, chair; cues help to extend LLE) Toileting - Water quality scientist and Hygiene: +2 Total assistance Toileting - Clothing Manipulation and Hygiene: Patient Percentage:  70% Equipment Used: Gait belt;Rolling walker Transfers/Ambulation Related to ADLs: ambulated with +2 for safety.  Variable assist for sit to stand 60-70% ADL Comments: stood to use urinal.  Performed front and back peri hygiene    OT Diagnosis: Generalized weakness  OT Problem List: Decreased strength;Decreased activity tolerance;Pain;Decreased cognition OT Treatment Interventions:     OT Goals    Visit Information  Last OT Received On: 12/05/12 Assistance Needed: +2 PT/OT Co-Evaluation/Treatment: Yes    Subjective Data  Subjective: I'm confused about the time.  It says 9:30 but it feels like 930 at night Patient Stated Goal: none stated.  Agreeable to OT   Prior [place] Lives With: Spouse Available Help at Discharge: [place] Prior Function Level of Independence: Independent with assistive device(s)         Vision/Perception Vision - Assessment Eye Alignment: Impaired (comment)   Cognition  Cognition Arousal/Alertness: Awake/alert Behavior During Therapy: WFL for tasks assessed/performed Overall Cognitive Status: Within Functional Limits for tasks assessed (c/o disorientation to time of day, knew it was am)    Extremity/Trunk Assessment Right Upper Extremity Assessment RUE ROM/Strength/Tone: Within functional levels Left Upper Extremity Assessment LUE ROM/Strength/Tone: Within functional levels     Mobility Bed Mobility Supine to Sit: 4: Min assist (used trapeze to pull up in bed) Details for Bed Mobility Assistance: cues to initiate Transfers Sit to Stand: 1: +2 Total assist;With upper extremity assist;From bed Sit to Stand: Patient Percentage: 60% (70%second trial) Details for Transfer Assistance: cues for hand, leg placement     Exercise     Balance     End  of Session OT - End of Session Activity Tolerance: Patient limited by fatigue Patient left: in chair;with call bell/phone within reach (with PT) CPM Left  Knee CPM Left Knee: Off  GO     [name] 12/05/2012, 10:44 AM Lesle Chris, OTR/L [phone] 12/05/2012

## 2012-12-05 NOTE — Discharge Summary (Signed)
Physician Discharge Summary   Patient ID: Bradley [name] MRN: [id number] DOB/AGE: [birth year] 79 y.o.  Admit date: 12/01/2012 Discharge date: Tentative Date of Discharge - 12/06/2011  Primary Diagnosis:  Osteoarthritis Left knee  Admission Diagnoses:  Past Medical History  Diagnosis Date  . Hypertension   . Cataract   . Syncope and collapse 10/15/2012    NEUROLOGIC EXAM - NO ACUTE FINDINGS - BUT "SIGNIFICANT ATHEROSCLEROTIC VASCULAR BURDEN BASED ON MRA OF BRAIN" - PER DISCHARGE SUMMARY 10/17/12 [place]  . Pneumonia 1980's?  Marland Kitchen GERD (gastroesophageal reflux disease)   . UTI (urinary tract infection)     UTI DIAGNOSED AND TREATED WHEN PT HOSP FOR SYNCOPE 10/15/12  . Strabismus     RIGHT EYE  . BPH (benign prostatic hypertrophy)   . PPD positive     HX POSITIVE PPD WITH NEGATIVE CXR  . Varicosities of leg     LEFT  . Chronic kidney disease     CHRONIC RENAL INSUFFICIENCY - CREATININE 1.8 TO 1.9  . Dysrhythmia     "VENTRICULAR ECTOPY"  --NEGATIVE STRESS TEST FOR CORONARY ISCHEMIA, MARCH 2008 - PER OFFICE NOTES DR. R. N. GATES.  Marland Kitchen Arthritis     "knees" (10/15/2012)   Discharge Diagnoses:   Principal Problem:   OA (osteoarthritis) of knee Active Problems:   Postoperative anemia due to acute blood loss   Postop Hyponatremia   UTI (urinary tract infection)   Postop Transfusion  Estimated body mass index is 27.58 kg/(m^2) as calculated from the following:   Height as of this encounter: 6\' 1"  (1.854 m).   Weight as of this encounter: 94.802 kg (209 lb).  Procedure:  Procedure(s) (LRB): LEFT TOTAL KNEE ARTHROPLASTY (Left)   Consults: None  HPI: Bradley [name] is a 79 y.o. year old male with end stage OA of his left knee with progressively worsening pain and dysfunction. He has constant pain, with activity and at rest and significant functional deficits with difficulties even with ADLs. He has had extensive non-op management including analgesics, injections of cortisone and  viscosupplements, and home exercise program, but remains in significant pain with significant dysfunction. Radiographs show bone on bone arthritis all 3 compartments. He presents now for left Total Knee Arthroplasty.   Laboratory Data: Admission on 12/01/2012  Component Date Value Range Status  . ABO/RH(D) 12/01/2012 AB POS   Final  . Antibody Screen 12/01/2012 POS   Final  . Sample Expiration 12/01/2012 12/04/2012   Final  . Antibody Identification [id number] WARM AUTOANTIBODY   Final  . DAT, IgG 12/01/2012 POS   Final  . Antibody ID,T [place] 12/01/2012 NO SPECIFIC ANTIBODY DEMONSTRATED IN [place]   Final  . Unit Number 12/01/2012 [id number]   Final  . Blood Component Type 12/01/2012 RED CELLS,LR   Final  . Unit division 12/01/2012 00   Final  . Status of Unit 12/01/2012 ISSUED,FINAL   Final  . Transfusion Status 12/01/2012 OK TO TRANSFUSE   Final  . Crossmatch Result 12/01/2012 COMPATIBLE   Final  . Unit Number 12/01/2012 [id number]   Final  . Blood Component Type 12/01/2012 RED CELLS,LR   Final  . Unit division 12/01/2012 00   Final  . Status of Unit 12/01/2012 ISSUED,FINAL   Final  . Transfusion Status 12/01/2012 OK TO TRANSFUSE   Final  . Crossmatch Result 12/01/2012 COMPATIBLE   Final  . Unit Number 12/01/2012 [id number]   Final  . Blood Component Type 12/01/2012 RED CELLS,LR   Final  . Unit division 12/01/2012  00   Final  . Status of Unit 12/01/2012 ISSUED   Final  . Transfusion Status 12/01/2012 OK TO TRANSFUSE   Final  . Crossmatch Result 12/01/2012 COMPATIBLE   Final  . Unit Number 12/01/2012 [id number]   Final  . Blood Component Type 12/01/2012 RED CELLS,LR   Final  . Unit division 12/01/2012 00   Final  . Status of Unit 12/01/2012 ISSUED   Final  . Transfusion Status 12/01/2012 OK TO TRANSFUSE   Final  . Crossmatch Result 12/01/2012 COMPATIBLE   Final  . WBC 12/02/2012 10.5  4.0 - 10.5 K/uL Final  . RBC 12/02/2012 3.09* 4.22 - 5.81 MIL/uL Final  .  Hemoglobin 12/02/2012 8.2* 13.0 - 17.0 g/dL Final  . HCT 12/02/2012 26.6* 39.0 - 52.0 % Final  . MCV 12/02/2012 86.1  78.0 - 100.0 fL Final  . MCH 12/02/2012 26.5  26.0 - 34.0 pg Final  . MCHC 12/02/2012 30.8  30.0 - 36.0 g/dL Final  . RDW 12/02/2012 13.0  11.5 - 15.5 % Final  . Platelets 12/02/2012 151  150 - 400 K/uL Final  . Sodium 12/02/2012 138  135 - 145 mEq/L Final  . Potassium 12/02/2012 4.7  3.5 - 5.1 mEq/L Final  . Chloride 12/02/2012 105  96 - 112 mEq/L Final  . CO2 12/02/2012 25  19 - 32 mEq/L Final  . Glucose, Bld 12/02/2012 132* 70 - 99 mg/dL Final  . BUN 12/02/2012 27* 6 - 23 mg/dL Final  . Creatinine, Ser 12/02/2012 2.13* 0.50 - 1.35 mg/dL Final  . Calcium 12/02/2012 8.1* 8.4 - 10.5 mg/dL Final  . GFR calc non Af Amer 12/02/2012 27* >90 mL/min Final  . GFR calc Af Amer 12/02/2012 32* >90 mL/min Final   Comment:                                 The eGFR has been calculated                          using the CKD EPI equation.                          This calculation has not been                          validated in all clinical                          situations.                          eGFR's persistently                          <90 mL/min signify                          possible Chronic Kidney Disease.  . Color, Urine 12/02/2012 YELLOW  YELLOW Final  . APPearance 12/02/2012 CLOUDY* CLEAR Final  . Specific Gravity, Urine 12/02/2012 1.032* 1.005 - 1.030 Final  . pH 12/02/2012 5.0  5.0 - 8.0 Final  . Glucose, UA 12/02/2012 NEGATIVE  NEGATIVE mg/dL Final  . Hgb urine dipstick 12/02/2012 MODERATE* NEGATIVE Final  . Bilirubin Urine 12/02/2012 NEGATIVE  NEGATIVE Final  . Ketones, ur 12/02/2012 NEGATIVE  NEGATIVE mg/dL Final  . Protein, ur 12/02/2012 NEGATIVE  NEGATIVE mg/dL Final  . Urobilinogen, UA 12/02/2012 0.2  0.0 - 1.0 mg/dL Final  . Nitrite 12/02/2012 NEGATIVE  NEGATIVE Final  . Leukocytes, UA 12/02/2012 MODERATE* NEGATIVE Final  . Specimen Description  12/02/2012 URINE, CATHETERIZED   Final  . Special Requests 12/02/2012 NONE   Final  . Culture  Setup Time 12/02/2012 12/02/2012 12:43   Final  . Colony Count 12/02/2012 NO GROWTH   Final  . Culture 12/02/2012 NO GROWTH   Final  . Report Status 12/02/2012 12/03/2012 FINAL   Final  . Squamous Epithelial / LPF 12/02/2012 FEW* RARE Final  . WBC, UA 12/02/2012 21-50  <3 WBC/hpf Final  . RBC / HPF 12/02/2012 7-10  <3 RBC/hpf Final  . Bacteria, UA 12/02/2012 FEW* RARE Final  . Urine-Other 12/02/2012 LESS THAN 10 mL OF URINE SUBMITTED   Final  . WBC 12/03/2012 10.7* 4.0 - 10.5 K/uL Final  . RBC 12/03/2012 2.62* 4.22 - 5.81 MIL/uL Final  . Hemoglobin 12/03/2012 7.0* 13.0 - 17.0 g/dL Final  . HCT 12/03/2012 22.4* 39.0 - 52.0 % Final  . MCV 12/03/2012 85.5  78.0 - 100.0 fL Final  . MCH 12/03/2012 26.7  26.0 - 34.0 pg Final  . MCHC 12/03/2012 31.3  30.0 - 36.0 g/dL Final  . RDW 12/03/2012 13.0  11.5 - 15.5 % Final  . Platelets 12/03/2012 145* 150 - 400 K/uL Final  . Sodium 12/03/2012 133* 135 - 145 mEq/L Final  . Potassium 12/03/2012 4.7  3.5 - 5.1 mEq/L Final  . Chloride 12/03/2012 100  96 - 112 mEq/L Final  . CO2 12/03/2012 26  19 - 32 mEq/L Final  . Glucose, Bld 12/03/2012 138* 70 - 99 mg/dL Final  . BUN 12/03/2012 38* 6 - 23 mg/dL Final  . Creatinine, Ser 12/03/2012 2.20* 0.50 - 1.35 mg/dL Final  . Calcium 12/03/2012 8.0* 8.4 - 10.5 mg/dL Final  . GFR calc non Af Amer 12/03/2012 26* >90 mL/min Final  . GFR calc Af Amer 12/03/2012 30* >90 mL/min Final   Comment:                                 The eGFR has been calculated                          using the CKD EPI equation.                          This calculation has not been                          validated in all clinical                          situations.                          eGFR's persistently                          <90 mL/min signify  possible Chronic Kidney Disease.  . Order Confirmation  12/03/2012 ORDER PROCESSED BY BLOOD BANK   Final  . WBC 12/04/2012 8.9  4.0 - 10.5 K/uL Final  . RBC 12/04/2012 2.69* 4.22 - 5.81 MIL/uL Final  . Hemoglobin 12/04/2012 7.3* 13.0 - 17.0 g/dL Final  . HCT 12/04/2012 22.5* 39.0 - 52.0 % Final  . MCV 12/04/2012 83.6  78.0 - 100.0 fL Final  . MCH 12/04/2012 27.1  26.0 - 34.0 pg Final  . MCHC 12/04/2012 32.4  30.0 - 36.0 g/dL Final  . RDW 12/04/2012 14.0  11.5 - 15.5 % Final  . Platelets 12/04/2012 141* 150 - 400 K/uL Final  . Sodium 12/04/2012 131* 135 - 145 mEq/L Final  . Potassium 12/04/2012 4.5  3.5 - 5.1 mEq/L Final  . Chloride 12/04/2012 98  96 - 112 mEq/L Final  . CO2 12/04/2012 25  19 - 32 mEq/L Final  . Glucose, Bld 12/04/2012 111* 70 - 99 mg/dL Final  . BUN 12/04/2012 43* 6 - 23 mg/dL Final  . Creatinine, Ser 12/04/2012 2.05* 0.50 - 1.35 mg/dL Final  . Calcium 12/04/2012 7.8* 8.4 - 10.5 mg/dL Final  . GFR calc non Af Amer 12/04/2012 29* >90 mL/min Final  . GFR calc Af Amer 12/04/2012 33* >90 mL/min Final   Comment:                                 The eGFR has been calculated                          using the CKD EPI equation.                          This calculation has not been                          validated in all clinical                          situations.                          eGFR's persistently                          <90 mL/min signify                          possible Chronic Kidney Disease.  . Order Confirmation 12/04/2012 ORDER PROCESSED BY BLOOD BANK   Final  . WBC 12/05/2012 7.0  4.0 - 10.5 K/uL Final  . RBC 12/05/2012 3.10* 4.22 - 5.81 MIL/uL Final  . Hemoglobin 12/05/2012 8.6* 13.0 - 17.0 g/dL Final  . HCT 12/05/2012 26.1* 39.0 - 52.0 % Final  . MCV 12/05/2012 84.2  78.0 - 100.0 fL Final  . MCH 12/05/2012 27.7  26.0 - 34.0 pg Final  . MCHC 12/05/2012 33.0  30.0 - 36.0 g/dL Final  . RDW 12/05/2012 14.0  11.5 - 15.5 % Final  . Platelets 12/05/2012 145* 150 - 400 K/uL Final  . Sodium 12/05/2012 134*  135 - 145 mEq/L Final  . Potassium 12/05/2012 4.5  3.5 - 5.1 mEq/L Final  . Chloride 12/05/2012 100  96 - 112 mEq/L  Final  . CO2 12/05/2012 27  19 - 32 mEq/L Final  . Glucose, Bld 12/05/2012 97  70 - 99 mg/dL Final  . BUN 12/05/2012 38* 6 - 23 mg/dL Final  . Creatinine, Ser 12/05/2012 1.87* 0.50 - 1.35 mg/dL Final  . Calcium 12/05/2012 8.1* 8.4 - 10.5 mg/dL Final  . GFR calc non Af Amer 12/05/2012 32* >90 mL/min Final  . GFR calc Af Amer 12/05/2012 37* >90 mL/min Final   Comment:                                 The eGFR has been calculated                          using the CKD EPI equation.                          This calculation has not been                          validated in all clinical                          situations.                          eGFR's persistently                          <90 mL/min signify                          possible Chronic Kidney Disease.  Hospital Outpatient Visit on 11/25/2012  Component Date Value Range Status  . Color, Urine 11/25/2012 YELLOW  YELLOW Final  . APPearance 11/25/2012 CLOUDY* CLEAR Final  . Specific Gravity, Urine 11/25/2012 1.009  1.005 - 1.030 Final  . pH 11/25/2012 6.5  5.0 - 8.0 Final  . Glucose, UA 11/25/2012 NEGATIVE  NEGATIVE mg/dL Final  . Hgb urine dipstick 11/25/2012 SMALL* NEGATIVE Final  . Bilirubin Urine 11/25/2012 NEGATIVE  NEGATIVE Final  . Ketones, ur 11/25/2012 NEGATIVE  NEGATIVE mg/dL Final  . Protein, ur 11/25/2012 NEGATIVE  NEGATIVE mg/dL Final  . Urobilinogen, UA 11/25/2012 0.2  0.0 - 1.0 mg/dL Final  . Nitrite 11/25/2012 POSITIVE* NEGATIVE Final  . Leukocytes, UA 11/25/2012 LARGE* NEGATIVE Final  . Specimen Description 11/25/2012 URINE, CLEAN CATCH   Final  . Special Requests 11/25/2012 NONE   Final  . Culture  Setup Time 11/25/2012 11/25/2012 19:07   Final  . Colony Count 11/25/2012 >=100,000 COLONIES/ML   Final  . Culture 11/25/2012 ESCHERICHIA COLI   Final  . Report Status 11/25/2012 11/27/2012 FINAL    Final  . Organism ID, Bacteria 11/25/2012 ESCHERICHIA COLI   Final  . Squamous Epithelial / LPF 11/25/2012 RARE  RARE Final  . WBC, UA 11/25/2012 21-50  <3 WBC/hpf Final  . Bacteria, UA 11/25/2012 MANY* RARE Final  Hospital Outpatient Visit on 11/24/2012  Component Date Value Range Status  . MRSA, PCR 11/24/2012 NEGATIVE  NEGATIVE Final  . Staphylococcus aureus 11/24/2012 NEGATIVE  NEGATIVE Final   Comment:  The Xpert SA Assay (FDA                          approved for NASAL specimens                          in patients over 35 years of age),                          is one component of                          a comprehensive surveillance                          program.  Test performance has                          been validated by [employer] for patients greater                          than or equal to 68 year old.                          It is not intended                          to diagnose infection nor to                          guide or monitor treatment.  Marland Kitchen aPTT 11/24/2012 41* 24 - 37 seconds Final   Comment:                                 IF BASELINE aPTT IS ELEVATED,                          SUGGEST PATIENT RISK ASSESSMENT                          BE USED TO DETERMINE APPROPRIATE                          ANTICOAGULANT THERAPY.  . WBC 11/24/2012 5.3  4.0 - 10.5 K/uL Final  . RBC 11/24/2012 4.18* 4.22 - 5.81 MIL/uL Final  . Hemoglobin 11/24/2012 11.1* 13.0 - 17.0 g/dL Final  . HCT 11/24/2012 35.8* 39.0 - 52.0 % Final  . MCV 11/24/2012 85.6  78.0 - 100.0 fL Final  . MCH 11/24/2012 26.6  26.0 - 34.0 pg Final  . MCHC 11/24/2012 31.0  30.0 - 36.0 g/dL Final  . RDW 11/24/2012 12.7  11.5 - 15.5 % Final  . Platelets 11/24/2012 209  150 - 400 K/uL Final  . Sodium 11/24/2012 137  135 - 145 mEq/L Final  . Potassium 11/24/2012 4.4  3.5 - 5.1 mEq/L Final  . Chloride 11/24/2012 101  96 - 112 mEq/L Final  . CO2  11/24/2012 27  19 - 32 mEq/L Final  . Glucose, Bld 11/24/2012 85  70 - 99 mg/dL Final  . BUN 11/24/2012 25* 6 - 23 mg/dL Final  . Creatinine, Ser 11/24/2012 2.00* 0.50 - 1.35 mg/dL Final  . Calcium 11/24/2012 9.0  8.4 - 10.5 mg/dL Final  . Total Protein 11/24/2012 6.8  6.0 - 8.3 g/dL Final  . Albumin 11/24/2012 3.4* 3.5 - 5.2 g/dL Final  . AST 11/24/2012 13  0 - 37 U/L Final  . ALT 11/24/2012 11  0 - 53 U/L Final  . Alkaline Phosphatase 11/24/2012 70  39 - 117 U/L Final  . Total Bilirubin 11/24/2012 0.4  0.3 - 1.2 mg/dL Final  . GFR calc non Af Amer 11/24/2012 29* >90 mL/min Final  . GFR calc Af Amer 11/24/2012 34* >90 mL/min Final   Comment:                                 The eGFR has been calculated                          using the CKD EPI equation.                          This calculation has not been                          validated in all clinical                          situations.                          eGFR's persistently                          <90 mL/min signify                          possible Chronic Kidney Disease.  Marland Kitchen Prothrombin Time 11/24/2012 13.4  11.6 - 15.2 seconds Final  . INR 11/24/2012 1.03  0.00 - 1.49 Final  . ABO/RH(D) 11/24/2012 AB POS   Final  . Antibody Screen 11/24/2012 NEG   Final  . Sample Expiration 11/24/2012 11/23/2012   Final  . ABO/RH(D) 11/24/2012 AB POS   Final  Admission on 10/15/2012, Discharged on 10/17/2012  Component Date Value Range Status  . WBC 10/15/2012 4.0  4.0 - 10.5 K/uL Final  . RBC 10/15/2012 4.00* 4.22 - 5.81 MIL/uL Final  . Hemoglobin 10/15/2012 10.9* 13.0 - 17.0 g/dL Final  . HCT 10/15/2012 34.4* 39.0 - 52.0 % Final  . MCV 10/15/2012 86.0  78.0 - 100.0 fL Final  . MCH 10/15/2012 27.3  26.0 - 34.0 pg Final  . MCHC 10/15/2012 31.7  30.0 - 36.0 g/dL Final  . RDW 10/15/2012 12.4  11.5 - 15.5 % Final  . Platelets 10/15/2012 193  150 - 400 K/uL Final  . Neutrophils Relative 10/15/2012 63  43 - 77 % Final  . Neutro  Abs 10/15/2012 2.5  1.7 - 7.7 K/uL Final  . Lymphocytes Relative 10/15/2012 24  12 - 46 % Final  . Lymphs Abs 10/15/2012 0.9  0.7 - 4.0 K/uL Final  .  Monocytes Relative 10/15/2012 10  3 - 12 % Final  . Monocytes Absolute 10/15/2012 0.4  0.1 - 1.0 K/uL Final  . Eosinophils Relative 10/15/2012 3  0 - 5 % Final  . Eosinophils Absolute 10/15/2012 0.1  0.0 - 0.7 K/uL Final  . Basophils Relative 10/15/2012 1  0 - 1 % Final  . Basophils Absolute 10/15/2012 0.0  0.0 - 0.1 K/uL Final  . Sodium 10/15/2012 137  135 - 145 mEq/L Final  . Potassium 10/15/2012 4.2  3.5 - 5.1 mEq/L Final  . Chloride 10/15/2012 103  96 - 112 mEq/L Final  . CO2 10/15/2012 24  19 - 32 mEq/L Final  . Glucose, Bld 10/15/2012 98  70 - 99 mg/dL Final  . BUN 10/15/2012 24* 6 - 23 mg/dL Final  . Creatinine, Ser 10/15/2012 2.12* 0.50 - 1.35 mg/dL Final  . Calcium 10/15/2012 8.5  8.4 - 10.5 mg/dL Final  . GFR calc non Af Amer 10/15/2012 27* >90 mL/min Final  . GFR calc Af Amer 10/15/2012 32* >90 mL/min Final   Comment:                                 The eGFR has been calculated                          using the CKD EPI equation.                          This calculation has not been                          validated in all clinical                          situations.                          eGFR's persistently                          <90 mL/min signify                          possible Chronic Kidney Disease.  . Color, Urine 10/15/2012 YELLOW  YELLOW Final  . APPearance 10/15/2012 HAZY* CLEAR Final  . Specific Gravity, Urine 10/15/2012 1.013  1.005 - 1.030 Final  . pH 10/15/2012 5.5  5.0 - 8.0 Final  . Glucose, UA 10/15/2012 NEGATIVE  NEGATIVE mg/dL Final  . Hgb urine dipstick 10/15/2012 SMALL* NEGATIVE Final  . Bilirubin Urine 10/15/2012 NEGATIVE  NEGATIVE Final  . Ketones, ur 10/15/2012 NEGATIVE  NEGATIVE mg/dL Final  . Protein, ur 10/15/2012 NEGATIVE  NEGATIVE mg/dL Final  . Urobilinogen, UA 10/15/2012 0.2  0.0 -  1.0 mg/dL Final  . Nitrite 10/15/2012 NEGATIVE  NEGATIVE Final  . Leukocytes, UA 10/15/2012 MODERATE* NEGATIVE Final  . Troponin I 10/15/2012 <0.30  <0.30 ng/mL Final   Comment:                                 Due to the release kinetics of cTnI,  a negative result within the first hours                          of the onset of symptoms does not rule out                          myocardial infarction with certainty.                          If myocardial infarction is still suspected,                          repeat the test at appropriate intervals.  . Hemoglobin A1C 10/15/2012 5.8* <5.7 % Final   Comment: (NOTE)                                                                                                                         According to the ADA Clinical Practice Recommendations for 2011, when                          HbA1c is used as a screening test:                           >=6.5%   Diagnostic of Diabetes Mellitus                                    (if abnormal result is confirmed)                          5.7-6.4%   Increased risk of developing Diabetes Mellitus                          References:Diagnosis and Classification of Diabetes Mellitus,Diabetes                          [id number] 1):S62-S69 and Standards of Medical Care in                                  Diabetes - 2011,Diabetes [id number] (Suppl 1):S11-S61.  . Mean Plasma Glucose 10/15/2012 120* <117 mg/dL Final  . Cholesterol 10/16/2012 162  0 - 200 mg/dL Final  . Triglycerides 10/16/2012 110  <150 mg/dL Final  . HDL 10/16/2012 39* >39 mg/dL Final  . Total CHOL/HDL Ratio 10/16/2012 4.2   Final  . VLDL 10/16/2012 22  0 - 40 mg/dL Final  . LDL Cholesterol 10/16/2012 101* 0 - 99 mg/dL Final   Comment:  Total Cholesterol/HDL:CHD Risk                          Coronary Heart Disease Risk Table                                              Men   Women                             1/2 Average Risk   3.4   3.3                           Average Risk       5.0   4.4                           2 X Average Risk   9.6   7.1                           3 X Average Risk  23.4   11.0                                                          Use the calculated Patient Ratio                          above and the CHD Risk Table                          to determine the patient's CHD Risk.                                                          ATP III CLASSIFICATION (LDL):                           <100     mg/dL   Optimal                           100-129  mg/dL   Near or Above                                             Optimal                           130-159  mg/dL   Borderline                           160-189  mg/dL   High                           >  190     mg/dL   Very High  . Opiates 10/16/2012 NONE DETECTED  NONE DETECTED Final  . Cocaine 10/16/2012 NONE DETECTED  NONE DETECTED Final  . Benzodiazepines 10/16/2012 NONE DETECTED  NONE DETECTED Final  . Amphetamines 10/16/2012 NONE DETECTED  NONE DETECTED Final  . Tetrahydrocannabinol 10/16/2012 NONE DETECTED  NONE DETECTED Final  . Barbiturates 10/16/2012 NONE DETECTED  NONE DETECTED Final   Comment:                                 DRUG SCREEN FOR MEDICAL PURPOSES                          ONLY.  IF CONFIRMATION IS NEEDED                          FOR ANY PURPOSE, NOTIFY LAB                          WITHIN 5 DAYS.                                                          LOWEST DETECTABLE LIMITS                          FOR URINE DRUG SCREEN                          Drug Class       Cutoff (ng/mL)                          Amphetamine      1000                          Barbiturate      200                          Benzodiazepine   200                          Tricyclics       [id number]                          Opiates          300                          Cocaine          300                           THC              50  . WBC 10/15/2012 5.9  4.0 - 10.5 K/uL Final  . RBC 10/15/2012 4.00* 4.22 - 5.81 MIL/uL Final  . Hemoglobin 10/15/2012 11.2* 13.0 - 17.0 g/dL Final  . HCT 10/15/2012 34.3* 39.0 -  52.0 % Final  . MCV 10/15/2012 85.8  78.0 - 100.0 fL Final  . MCH 10/15/2012 28.0  26.0 - 34.0 pg Final  . MCHC 10/15/2012 32.7  30.0 - 36.0 g/dL Final  . RDW 10/15/2012 12.4  11.5 - 15.5 % Final  . Platelets 10/15/2012 214  150 - 400 K/uL Final  . Creatinine, Ser 10/15/2012 2.05* 0.50 - 1.35 mg/dL Final  . GFR calc non Af Amer 10/15/2012 29* >90 mL/min Final  . GFR calc Af Amer 10/15/2012 33* >90 mL/min Final   Comment:                                 The eGFR has been calculated                          using the CKD EPI equation.                          This calculation has not been                          validated in all clinical                          situations.                          eGFR's persistently                          <90 mL/min signify                          possible Chronic Kidney Disease.  Marland Kitchen Squamous Epithelial / LPF 10/15/2012 FEW* RARE Final  . WBC, UA 10/15/2012 TOO NUMEROUS TO COUNT  <3 WBC/hpf Final  . RBC / HPF 10/15/2012 0-2  <3 RBC/hpf Final  . Bacteria, UA 10/15/2012 MANY* RARE Final  . Specimen Description 10/15/2012 URINE, RANDOM   Final  . Special Requests 10/15/2012 NONE   Final  . Culture  Setup Time 10/15/2012 10/15/2012 17:34   Final  . Colony Count 10/15/2012 >=100,000 COLONIES/ML   Final  . Culture 10/15/2012 ESCHERICHIA COLI   Final  . Report Status 10/15/2012 10/17/2012 FINAL   Final  . Organism ID, Bacteria 10/15/2012 ESCHERICHIA COLI   Final  . Glucose-Capillary 10/15/2012 111* 70 - 99 mg/dL Final  . Comment 1 10/15/2012 Notify RN   Final  . Glucose-Capillary 10/16/2012 88  70 - 99 mg/dL Final  . Glucose-Capillary 10/16/2012 90  70 - 99 mg/dL Final  . Glucose-Capillary 10/16/2012 105* 70 - 99 mg/dL Final  . Glucose-Capillary  10/16/2012 110* 70 - 99 mg/dL Final  . Comment 1 10/16/2012 Notify RN   Final  . Glucose-Capillary 10/17/2012 94  70 - 99 mg/dL Final  . Comment 1 10/17/2012 Notify RN   Final  . Glucose-Capillary 10/17/2012 85  70 - 99 mg/dL Final     X-Rays:No results found.  EKG: Orders placed during the hospital encounter of 10/15/12  . EKG 12-LEAD  . EKG 12-LEAD  . EKG 12-LEAD  . EKG 12-LEAD  . EKG 12-LEAD  . EKG 12-LEAD  . EKG  . EKG  Hospital Course: Bradley [name] is a 79 y.o. who was [facility] to [facility]. They were brought to the operating room on 12/01/2012 and underwent Procedure(s): LEFT TOTAL KNEE ARTHROPLASTY.  Patient tolerated the procedure well and was later transferred to the recovery room and then to the orthopaedic floor for postoperative care.  They were given PO and IV analgesics for pain control following their surgery.  They were given 24 hours of postoperative antibiotics of  Anti-infectives   Start     Dose/Rate Route Frequency Ordered Stop             12/01/12 1800  ceFAZolin (ANCEF) IVPB 2 g/50 mL premix     2 g 100 mL/hr over 30 Minutes Intravenous Every 6 hours 12/01/12 1506 12/02/12 0037   12/01/12 0845  ceFAZolin (ANCEF) IVPB 2 g/50 mL premix    Comments:  Dose reduced to 2g per P&T policy for weight < [id number] (weight 94kg)   2 g 100 mL/hr over 30 Minutes Intravenous On call to O.R. 12/01/12 0840 12/01/12 1135     and started on DVT prophylaxis in the form of Lovenox.   PT and OT were ordered for total joint protocol.  Discharge planning consulted to help with postop disposition and equipment needs.  Patient had a decent night on the evening of surgery.  They started to get up OOB with therapy on day one. Continued foley due to bladder outlet obstruction and urinary output monitoring. Hemovac drain was pulled without difficulty.  Continued to work with therapy into day two. Patient was well, but has had some minor complaints of constipation and pain in  the knee, requiring pain medications. He also had some belching and nausea the night before.  Dressing was changed on day two and the incision was healing well.  HGB is down to 7.0. Gave two units of blood and lasix between units. Rechecked his urine and found to have chronic UTI. Started on IV Rocephin Q 12 hours.  By day three, patient was seen in rounds by Dr. Wynelle Link. He continued with hiccups. He received blood the day before but HGB did not bump. Rechecked labs and monitor for symptoms. Know hx of reflux. Started on IV ABX on day 2 due to chronic UTI. Monitored bowels. Will guaiac stool and emesis if occured. Meds changed by Dr. Wynelle Link. Kept in house today for further monitoring. HE was not ready for any rehab at that point.  Monitored for further GI symptoms.  He was seen again on POD 4 and was stable.  He was still constipated and continued with hiccups.  Worked on his bowels and followed to see how he did.  If he improved thru the morning, then possible transfer over to SNF. Patient was seen in rounds and was tentatively setup for transfer later on the day of POD 4 but will be reevaluated prior to final decision to transfer.  Discharge Medications: Current Discharge Medication List    START taking these medications   Details  acetaminophen (TYLENOL) 325 MG tablet Take 2 tablets (650 mg total) by mouth every 6 (six) hours as needed. Qty: 60 tablet, Refills: 0    alum & mag hydroxide-simeth (MAALOX/MYLANTA) [id number] MG/5ML suspension Take 30 mLs by mouth every 4 (four) hours as needed. Qty: 355 mL, Refills: 0    bisacodyl (DULCOLAX) 10 MG suppository Place 1 suppository (10 mg total) rectally daily as needed. Qty: 12 suppository, Refills: 0    cyclobenzaprine (FLEXERIL) 10 MG tablet  Take 1 tablet (10 mg total) by mouth 3 (three) times daily as needed for muscle spasms. Qty: 90 tablet, Refills: 0    diphenhydrAMINE (BENADRYL) 12.5 MG/5ML elixir Take 5-10 mLs (12.5-25 mg total) by mouth  every 4 (four) hours as needed for itching. Qty: 120 mL, Refills: 0    docusate sodium 100 MG CAPS Take 100 mg by mouth 2 (two) times daily. Qty: 60 capsule, Refills: 0    enoxaparin (LOVENOX) 30 MG/0.3ML injection Inject 0.3 mLs (30 mg total) into the skin every 12 (twelve) hours. Lovenox injections twice a day for six more days and then resume ASA 325 mg daily. Qty: 12 Syringe, Refills: 0    HYDROcodone-acetaminophen (NORCO/VICODIN) 5-325 MG per tablet Take 1-2 tablets by mouth every 4 (four) hours as needed. Qty: 90 tablet, Refills: 0    metoCLOPramide (REGLAN) 5 MG tablet Take 1 tablet (5 mg total) by mouth every 8 (eight) hours as needed (if ondansetron (ZOFRAN) ineffective.). Qty: 40 tablet, Refills: 0    ondansetron (ZOFRAN) 4 MG tablet Take 1 tablet (4 mg total) by mouth every 6 (six) hours as needed for nausea. Qty: 40 tablet, Refills: 0    polyethylene glycol (MIRALAX / GLYCOLAX) packet Take 17 g by mouth daily. Hold if loose stool or diarrhea Qty: 14 each, Refills: 0    sodium phosphate (FLEET) 7-19 GM/118ML ENEM Place 1 enema rectally daily as needed. Qty: 12 enema, Refills: 0    traMADol (ULTRAM) 50 MG tablet Take 1-2 tablets (50-100 mg total) by mouth every 6 (six) hours as needed (mild pain). Qty: 80 tablet, Refills: 0      CONTINUE these medications which have NOT CHANGED   Details  hydrochlorothiazide (HYDRODIURIL) 12.5 MG tablet Take 12.5 mg by mouth. TAKES EVERY OTHER DAY    omeprazole (PRILOSEC) 20 MG capsule Take 20 mg by mouth daily.    OVER THE COUNTER MEDICATION OVER THE COUNTER STOOL SOFTNERS DAILY AS NEEDED      STOP taking these medications     aspirin 325 MG tablet      cholecalciferol (VITAMIN D) 1000 UNITS tablet         Diet: Cardiac diet and Renal diet Activity:WBAT Follow-up:in 2 weeks Disposition - Skilled nursing facility Discharged Condition: Pending at the time of this summary.  Will transfer to SNF if  improved.    SignedMickel Crow 12/05/2012, 9:13 AM

## 2012-12-05 NOTE — Progress Notes (Signed)
Subjective: 4 Days Post-Op Procedure(s) (LRB): LEFT TOTAL KNEE ARTHROPLASTY (Left) Patient reports pain as mild.   Patient seen in rounds for Dr. Wynelle Link. Still with hiccups and constipation.  Will work on bowels again this morning.  Will give an additional dose of Reglan for bowels along with suppository this morning.  He starting to pass some flatus but not much.  Denies nausea/vomitng at this time.  Will see how he dose this morning.  Possible over to SNF this afternoon. Patient is well, but has had some minor complaints of constipation and pain in the knee, requiring pain medications. Continue on Rocephin for now due to chronic UTI.  Objective: Vital signs in last 24 hours: Temp:  [97.6 F (36.4 C)-99.6 F (37.6 C)] 98.1 F (36.7 C) (05/09 0540) Pulse Rate:  [78-98] 84 (05/09 0540) Resp:  [16-18] 16 (05/08 2200) BP: (125-167)/(56-90) 129/71 mmHg (05/09 0540) SpO2:  [95 %-100 %] 99 % (05/09 0540)  Intake/Output from previous day:  Intake/Output Summary (Last 24 hours) at 12/05/12 0905 Last data filed at 12/05/12 0845  Gross per 24 hour  Intake 3630.83 ml  Output   2050 ml  Net 1580.83 ml    Intake/Output this shift: Total I/O In: 240 [P.O.:240] Out: 300 [Urine:300]  Labs:  Recent Labs  12/03/12 0516 12/04/12 0500 12/05/12 0505  HGB 7.0* 7.3* 8.6*    Recent Labs  12/04/12 0500 12/05/12 0505  WBC 8.9 7.0  RBC 2.69* 3.10*  HCT 22.5* 26.1*  PLT 141* 145*    Recent Labs  12/04/12 0500 12/05/12 0505  NA 131* 134*  K 4.5 4.5  CL 98 100  CO2 25 27  BUN 43* 38*  CREATININE 2.05* 1.87*  GLUCOSE 111* 97  CALCIUM 7.8* 8.1*   No results found for this basename: LABPT, INR,  in the last 72 hours  EXAM: General - Patient is Alert, Appropriate and Oriented Extremity - Neurovascular intact Sensation intact distally Dorsiflexion/Plantar flexion intact No cellulitis present Incision - clean, dry, no drainage, healing Motor Function - intact, moving foot  and toes well on exam.   Assessment/Plan: 4 Days Post-Op Procedure(s) (LRB): LEFT TOTAL KNEE ARTHROPLASTY (Left) Procedure(s) (LRB): LEFT TOTAL KNEE ARTHROPLASTY (Left) Past Medical History  Diagnosis Date  . Hypertension   . Cataract   . Syncope and collapse 10/15/2012    NEUROLOGIC EXAM - NO ACUTE FINDINGS - BUT "SIGNIFICANT ATHEROSCLEROTIC VASCULAR BURDEN BASED ON MRA OF BRAIN" - PER DISCHARGE SUMMARY 10/17/12 [place]  . Pneumonia 1980's?  Marland Kitchen GERD (gastroesophageal reflux disease)   . UTI (urinary tract infection)     UTI DIAGNOSED AND TREATED WHEN PT HOSP FOR SYNCOPE 10/15/12  . Strabismus     RIGHT EYE  . BPH (benign prostatic hypertrophy)   . PPD positive     HX POSITIVE PPD WITH NEGATIVE CXR  . Varicosities of leg     LEFT  . Chronic kidney disease     CHRONIC RENAL INSUFFICIENCY - CREATININE 1.8 TO 1.9  . Dysrhythmia     "VENTRICULAR ECTOPY"  --NEGATIVE STRESS TEST FOR CORONARY ISCHEMIA, MARCH 2008 - PER OFFICE NOTES DR. R. N. GATES.  Marland Kitchen Arthritis     "knees" (10/15/2012)   Principal Problem:   OA (osteoarthritis) of knee Active Problems:   Postoperative anemia due to acute blood loss   Postop Hyponatremia   UTI (urinary tract infection)   Postop Transfusion  Estimated body mass index is 27.58 kg/(m^2) as calculated from the following:   Height  as of this encounter: 6\' 1"  (1.854 m).   Weight as of this encounter: 94.802 kg (209 lb). Up with therapy Discharge to SNF later today if improves Diet - Cardiac diet Follow up - in 2 weeks Activity - WBAT Disposition - Skilled nursing facility Condition Upon Discharge - pending at this time. D/C Meds - See DC Summary DVT Prophylaxis - Lovenox for 10 days total and then resume his full strength ASA 325 mg   Honey Zakarian 12/05/2012, 9:05 AM

## 2012-12-05 NOTE — Progress Notes (Signed)
Physical Therapy Treatment Patient Details Name: Bradley [name] MRN: [id number] DOB: 10-05-1927 Today's Date: 12/05/2012 Time: [id number] PT Time Calculation (min): 41 min  PT Assessment / Plan / Recommendation Comments on Treatment Session  POD #4 L TKR am session.  Assisted pt OOB along with OT.  Statis standing for hygiene and urinal.  Amb limited distance then performned TE's.  RA sats avg 94%. Pt c/o MAX hicups that persisted throughout session.     Follow Up Recommendations  SNF     Does the patient have the potential to tolerate intense rehabilitation     Barriers to Discharge        Equipment Recommendations  Rolling walker with 5" wheels    Recommendations for Other Services    Frequency 7X/week   Plan Discharge plan remains appropriate;Frequency remains appropriate    Precautions / Restrictions Precautions Precautions: Knee Required Braces or Orthoses: Knee Immobilizer - Left Knee Immobilizer - Left: Discontinue once straight leg raise with < 10 degree lag Restrictions Weight Bearing Restrictions: No Other Position/Activity Restrictions: WBAT   Pertinent Vitals/Pain C/o 6/10 L knee pain during gait C/o MAX hicups    Mobility  Bed Mobility Bed Mobility: Sit to Supine Supine to Sit: 4: Min assist Details for Bed Mobility Assistance: increased time and 25% VC's on proper tech Transfers Transfers: Sit to Stand;Stand to Sit Sit to Stand: 1: +2 Total assist;With upper extremity assist;From bed Sit to Stand: Patient Percentage: 70% Stand to Sit: 1: +2 Total assist;With upper extremity assist;To chair/3-in-1 Stand to Sit: Patient Percentage: 70% Details for Transfer Assistance: 50% VC's on proper hand placement and tech Ambulation/Gait Ambulation/Gait Assistance: 1: +2 Total assist Ambulation/Gait: Patient Percentage: 70% Ambulation Distance (Feet): 30 Feet Assistive device: Rolling walker Ambulation/Gait Assistance Details: 50% VC's on proper walker to self  distance, upright posture and proper L LE placement with increased WB. Gait Pattern: Step-to pattern;Decreased step length - right;Decreased step length - left;Trunk flexed Gait velocity: decreased    Exercises   Total Knee Replacement TE's 10 reps B LE ankle pumps 10 reps knee presses 10 reps heel slides  10 reps SAQ's 10 reps SLR's 10 reps ABD Followed by ICE    PT Goals                                                 progressing    Visit Information  Last PT Received On: 12/05/12 Assistance Needed: +2    Subjective Data      Cognition  Cognition Arousal/Alertness: Awake/alert Behavior During Therapy: WFL for tasks assessed/performed Overall Cognitive Status: Within Functional Limits for tasks assessed (c/o disorientation to time of day, knew it was am)    Balance     End of Session PT - End of Session Equipment Utilized During Treatment: Left knee immobilizer;Gait belt Activity Tolerance: Patient limited by fatigue;Patient tolerated treatment well Patient left: in chair;with call bell/phone within reach;with family/visitor present CPM Left Knee CPM Left Knee: Off   Rica Koyanagi  PTA [facility]  Acute  Rehab Pager      [phone]

## 2012-12-06 LAB — BASIC METABOLIC PANEL WITH GFR
BUN: 30 mg/dL — ABNORMAL HIGH (ref 6–23)
CO2: 25 meq/L (ref 19–32)
Calcium: 8.4 mg/dL (ref 8.4–10.5)
Chloride: 99 meq/L (ref 96–112)
Creatinine, Ser: 1.56 mg/dL — ABNORMAL HIGH (ref 0.50–1.35)
GFR calc Af Amer: 46 mL/min — ABNORMAL LOW
GFR calc non Af Amer: 40 mL/min — ABNORMAL LOW
Glucose, Bld: 104 mg/dL — ABNORMAL HIGH (ref 70–99)
Potassium: 4.5 meq/L (ref 3.5–5.1)
Sodium: 133 meq/L — ABNORMAL LOW (ref 135–145)

## 2012-12-06 LAB — CBC
Platelets: 173 10*3/uL (ref 150–400)
RBC: 3.02 MIL/uL — ABNORMAL LOW (ref 4.22–5.81)
RDW: 13.5 % (ref 11.5–15.5)
WBC: 7.8 10*3/uL (ref 4.0–10.5)

## 2012-12-06 MED ORDER — PEG 3350-KCL-NA BICARB-NACL 420 G PO SOLR
4000.0000 mL | Freq: Once | ORAL | Status: AC
  Administered 2012-12-06: 4000 mL via ORAL

## 2012-12-06 NOTE — Progress Notes (Signed)
Physical Therapy Treatment Patient Details Name: Bradley [name] MRN: [id number] DOB: 04-11-1928 Today's Date: 12/06/2012 Time: [id number] PT Time Calculation (min): 39 min  PT Assessment / Plan / Recommendation Comments on Treatment Session  pt continues to have hiccups and this is his greatest complaint; he is progressing  although slowly and will benefit form post acute rehab    Follow Up Recommendations  SNF     Does the patient have the potential to tolerate intense rehabilitation     Barriers to Discharge        Equipment Recommendations  Rolling walker with 5" wheels    Recommendations for Other Services    Frequency 7X/week   Plan Discharge plan remains appropriate;Frequency remains appropriate    Precautions / Restrictions Precautions Precautions: Knee;Fall Required Braces or Orthoses: Knee Immobilizer - Left Knee Immobilizer - Left: Discontinue once straight leg raise with < 10 degree lag Restrictions Other Position/Activity Restrictions: WBAT   Pertinent Vitals/Pain Min c/o L knee pain     Mobility  Bed Mobility Bed Mobility: Sit to Supine Supine to Sit: 3: Mod assist Details for Bed Mobility Assistance: increased time and cues for technique, use of UEs Transfers Transfers: Sit to Stand;Stand to Sit Sit to Stand: 1: +2 Total assist;From bed Sit to Stand: Patient Percentage: 70% Stand to Sit: 1: +2 Total assist;With upper extremity assist;To chair/3-in-1 Stand to Sit: Patient Percentage: 70% Details for Transfer Assistance: +2 for wt shift to stand/safety/balance and to control descent; multi-modal cues for hand placement and LLE management Ambulation/Gait Ambulation/Gait Assistance: 1: +2 Total assist Ambulation/Gait: Patient Percentage: 70% Ambulation Distance (Feet): 45 Feet Assistive device: Rolling walker Ambulation/Gait Assistance Details:  VC's on proper walker to self distance, upright posture and proper L LE placement with increased WB. Gait  Pattern: Step-to pattern;Step-through pattern;Narrow base of support General Gait Details: unsteady, continues to require +2 for safety    Exercises Total Joint Exercises Ankle Circles/Pumps: AROM;Both;15 reps Quad Sets: AROM;Both;10 reps Towel Squeeze: AROM;Both;10 reps Heel Slides: AAROM;Both;10 reps Straight Leg Raises: AROM;AAROM;Left;10 reps   PT Diagnosis:    PT Problem List:   PT Treatment Interventions:     PT Goals Acute Rehab PT Goals Time For Goal Achievement: 12/16/12 Potential to Achieve Goals: Good Pt will go Supine/Side to Sit: with mod assist PT Goal: Supine/Side to Sit - Progress: Progressing toward goal Pt will go Sit to Stand: with mod assist PT Goal: Sit to Stand - Progress: Progressing toward goal Pt will Ambulate: 16 - 50 feet;with min assist;with rolling walker PT Goal: Ambulate - Progress: Progressing toward goal Pt will Perform Home Exercise Program: with min assist PT Goal: Perform Home Exercise Program - Progress: Progressing toward goal  Visit Information  Last PT Received On: 12/06/12 Assistance Needed: +2    Subjective Data  Subjective: I am alright   Cognition  Cognition Arousal/Alertness: Awake/alert Behavior During Therapy: WFL for tasks assessed/performed Overall Cognitive Status: Within Functional Limits for tasks assessed (disoriented to day, saying things that didn't make sense )    Balance     End of Session PT - End of Session Equipment Utilized During Treatment: Left knee immobilizer;Gait belt Activity Tolerance: Patient limited by fatigue;Patient tolerated treatment well Patient left: in chair;with call bell/phone within reach;with family/visitor present Nurse Communication: Mobility status   GP     [facility] 12/06/2012, 11:56 AM

## 2012-12-06 NOTE — Progress Notes (Signed)
Subjective: 5 Days Post-Op Procedure(s) (LRB): LEFT TOTAL KNEE ARTHROPLASTY (Left) Patient reports pain as mild.   Patient seen in rounds with Dr. Wynelle Link.  He continues with hiccups.  His KUB did not show any obstruction or ileus but did show large amount of stool.  Working on bowels.  Night shift states that he became a little confused last night.  He has not been sleeping because of the hiccups which are probably a factor leading to some sleep deprivation.  Will work on his bowels more this morning.  Will try some Golytely.  Patient is well, but has had some minor complaints of pain in the knee, requiring pain medications.  He is off of the narcotics and taking Ultram. Plan is to go Skilled nursing facility after hospital stay.  Objective: Vital signs in last 24 hours: Temp:  [98.3 F (36.8 C)-99 F (37.2 C)] 99 F (37.2 C) (05/10 0500) Pulse Rate:  [81-89] 89 (05/10 0500) Resp:  [18] 18 (05/10 0500) BP: (124-172)/(64-84) 172/84 mmHg (05/10 0500) SpO2:  [92 %-96 %] 93 % (05/10 0500)  Intake/Output from previous day:  Intake/Output Summary (Last 24 hours) at 12/06/12 0708 Last data filed at 12/06/12 [id number]  Gross per 24 hour  Intake 1742.5 ml  Output   2175 ml  Net -432.5 ml    Intake/Output this shift:    Labs:  Recent Labs  12/04/12 0500 12/05/12 0505  HGB 7.3* 8.6*    Recent Labs  12/04/12 0500 12/05/12 0505  WBC 8.9 7.0  RBC 2.69* 3.10*  HCT 22.5* 26.1*  PLT 141* 145*    Recent Labs  12/04/12 0500 12/05/12 0505  NA 131* 134*  K 4.5 4.5  CL 98 100  CO2 25 27  BUN 43* 38*  CREATININE 2.05* 1.87*  GLUCOSE 111* 97  CALCIUM 7.8* 8.1*   No results found for this basename: LABPT, INR,  in the last 72 hours  EXAM General - Patient is Alert, Appropriate and Oriented to place and person this AM Extremity - Neurovascular intact Sensation intact distally Dorsiflexion/Plantar flexion intact No cellulitis present postop swelling in the  knee Dressing/Incision - clean, dry, no drainage, healing Motor Function - intact, moving foot and toes well on exam.   Past Medical History  Diagnosis Date  . Hypertension   . Cataract   . Syncope and collapse 10/15/2012    NEUROLOGIC EXAM - NO ACUTE FINDINGS - BUT "SIGNIFICANT ATHEROSCLEROTIC VASCULAR BURDEN BASED ON MRA OF BRAIN" - PER DISCHARGE SUMMARY 10/17/12 [place]  . Pneumonia 1980's?  Marland Kitchen GERD (gastroesophageal reflux disease)   . UTI (urinary tract infection)     UTI DIAGNOSED AND TREATED WHEN PT HOSP FOR SYNCOPE 10/15/12  . Strabismus     RIGHT EYE  . BPH (benign prostatic hypertrophy)   . PPD positive     HX POSITIVE PPD WITH NEGATIVE CXR  . Varicosities of leg     LEFT  . Chronic kidney disease     CHRONIC RENAL INSUFFICIENCY - CREATININE 1.8 TO 1.9  . Dysrhythmia     "VENTRICULAR ECTOPY"  --NEGATIVE STRESS TEST FOR CORONARY ISCHEMIA, MARCH 2008 - PER OFFICE NOTES DR. R. N. GATES.  Marland Kitchen Arthritis     "knees" (10/15/2012)    Assessment/Plan: 5 Days Post-Op Procedure(s) (LRB): LEFT TOTAL KNEE ARTHROPLASTY (Left) Principal Problem:   OA (osteoarthritis) of knee Active Problems:   Postoperative anemia due to acute blood loss   Postop Hyponatremia   UTI (urinary tract infection)  Postop Transfusion  Estimated body mass index is 27.58 kg/(m^2) as calculated from the following:   Height as of this encounter: 6\' 1"  (1.854 m).   Weight as of this encounter: 94.802 kg (209 lb). Up with therapy Recheck his labs this morning.  DVT Prophylaxis - Lovenox for 10 days and then resume his full strength ASA 325 mg Weight-Bearing as tolerated to left leg  PERKINS, ALEXZANDREW 12/06/2012, 7:08 AM

## 2012-12-07 LAB — BASIC METABOLIC PANEL
Chloride: 99 mEq/L (ref 96–112)
GFR calc Af Amer: 45 mL/min — ABNORMAL LOW (ref >90)
Potassium: 4.3 mEq/L (ref 3.5–5.1)
Sodium: 135 mEq/L (ref 135–145)

## 2012-12-07 LAB — CBC
HCT: 27.6 % — ABNORMAL LOW (ref 39.0–52.0)
Hemoglobin: 9.1 g/dL — ABNORMAL LOW (ref 13.0–17.0)
RBC: 3.26 MIL/uL — ABNORMAL LOW (ref 4.22–5.81)
RDW: 13.4 % (ref 11.5–15.5)
WBC: 9.4 10*3/uL (ref 4.0–10.5)

## 2012-12-07 MED ORDER — DIAZEPAM 5 MG PO TABS
5.0000 mg | ORAL_TABLET | Freq: Four times a day (QID) | ORAL | Status: DC | PRN
  Administered 2012-12-07: 5 mg via ORAL
  Filled 2012-12-07: qty 1

## 2012-12-07 NOTE — Progress Notes (Signed)
Report called to Katrina, Therapist, sports at [facility]

## 2012-12-07 NOTE — Progress Notes (Signed)
Physical Therapy Treatment Patient Details Name: AZAM UDDIN MRN: [id number] DOB: 1927/11/29 Today's Date: 12/07/2012 Time: [id number] PT Time Calculation (min): 39 min  PT Assessment / Plan / Recommendation Comments on Treatment Session  POD #6 L TKR progressing slowly. Assisted OOB to [facility] for a BM then amb in hallway.  Peformed TE's then applied ICE.  Pt plans to D/C to SNF for [facility].    Follow Up Recommendations  SNF     Does the patient have the potential to tolerate intense rehabilitation     Barriers to Discharge        Equipment Recommendations  Rolling walker with 5" wheels    Recommendations for Other Services    Frequency 7X/week   Plan Discharge plan remains appropriate;Frequency remains appropriate    Precautions / Restrictions Precautions Precautions: Knee;Fall Precaution Comments: did not amb with KI this session Restrictions Weight Bearing Restrictions: No Other Position/Activity Restrictions: WBAT   Pertinent Vitals/Pain C/o 5/10 with amb ICE applied    Mobility  Bed Mobility Bed Mobility: Sit to Supine Supine to Sit: 3: Mod assist Details for Bed Mobility Assistance: increased time and cues for technique, use of UEs Transfers Transfers: Sit to Stand;Stand to Sit Sit to Stand: 3: Mod assist;2: Max assist;From bed;From toilet Stand to Sit: To toilet;To chair/3-in-1 Details for Transfer Assistance: + 2 for safety and 50% VC's on proper tech and hand placement with increased time to steady self Ambulation/Gait Ambulation/Gait Assistance: 1: +2 Total assist Ambulation/Gait: Patient Percentage: 80% Ambulation Distance (Feet): 72 Feet Assistive device: Rolling walker Ambulation/Gait Assistance Details: amb w/o KI this session and pt was more able to advance L LE.  Still required 75% VC's on proper walker to self distance and L LE placement.  Gait Pattern: Step-to pattern;Step-through pattern;Narrow base of support Gait velocity: decreased General  Gait Details: unsteady, continues to require +2 for safety    Exercises   Total Knee Replacement TE's 10 reps B LE ankle pumps 10 reps knee presses 10 reps heel slides  10 reps SAQ's 10 reps SLR's 10 reps ABD Followed by ICE    PT Goals                                                      progressing    Visit Information  Last PT Received On: 12/07/12 Assistance Needed: +2    Subjective Data  Subjective: I feel alright   Cognition    good   Balance   poor  End of Session PT - End of Session Equipment Utilized During Treatment: Gait belt Activity Tolerance: Patient limited by fatigue Patient left: in chair;with call bell/phone within reach;with family/visitor present   Rica Koyanagi  PTA [facility]  Acute  Rehab Pager      [phone]

## 2012-12-07 NOTE — Progress Notes (Signed)
Pt stable, transported via [facility] to [facility].  Paperwork sent with [facility].

## 2012-12-07 NOTE — Progress Notes (Signed)
   Subjective: 6 Days Post-Op Procedure(s) (LRB): LEFT TOTAL KNEE ARTHROPLASTY (Left)  Pt c/o mild to moderate pain Plan for snf after stay  Denies any new symptoms Therapy going fairly well  Patient reports pain as moderate.  Objective:   VITALS:   Filed Vitals:   12/07/12 0430  BP: 161/93  Pulse: 80  Temp: 98.8 F (37.1 C)  Resp: 16    Left knee incision healing well No erythema, mild swelling No drainage nv intact distally  LABS  Recent Labs  12/05/12 0505 12/06/12 0730 12/07/12 0440  HGB 8.6* 8.6* 9.1*  HCT 26.1* 25.5* 27.6*  WBC 7.0 7.8 9.4  PLT 145* 173 197     Recent Labs  12/05/12 0505 12/06/12 0730  NA 134* 133*  K 4.5 4.5  BUN 38* 30*  CREATININE 1.87* 1.56*  GLUCOSE 97 104*     Assessment/Plan: 6 Days Post-Op Procedure(s) (LRB): LEFT TOTAL KNEE ARTHROPLASTY (Left)  Continue PT/OT Pulmonary toilet Pain control as needed  Up with therapy   Merla Riches, MPAS, PA-C  12/07/2012, 7:13 AM

## 2012-12-07 NOTE — Progress Notes (Signed)
Per MD, Pt ready for d/c.  Notified RN, Pt, family and facility.  Facility ready to receive Pt.  Per Katrina, facility not in need of additional information at the outset.  Facility will review Pt's information upon arrival.  Arranged for transportation.  Bernita Raisin, [place] [facility] [phone]

## 2012-12-08 ENCOUNTER — Non-Acute Institutional Stay (SKILLED_NURSING_FACILITY): Payer: Medicare Other | Admitting: Internal Medicine

## 2012-12-08 DIAGNOSIS — M179 Osteoarthritis of knee, unspecified: Secondary | ICD-10-CM

## 2012-12-08 DIAGNOSIS — D62 Acute posthemorrhagic anemia: Secondary | ICD-10-CM

## 2012-12-08 DIAGNOSIS — R066 Hiccough: Secondary | ICD-10-CM

## 2012-12-08 DIAGNOSIS — K219 Gastro-esophageal reflux disease without esophagitis: Secondary | ICD-10-CM

## 2012-12-08 DIAGNOSIS — R141 Gas pain: Secondary | ICD-10-CM

## 2012-12-08 DIAGNOSIS — R05 Cough: Secondary | ICD-10-CM

## 2012-12-08 DIAGNOSIS — R143 Flatulence: Secondary | ICD-10-CM

## 2012-12-08 DIAGNOSIS — M1710 Unilateral primary osteoarthritis, unspecified knee: Secondary | ICD-10-CM

## 2012-12-08 DIAGNOSIS — R059 Cough, unspecified: Secondary | ICD-10-CM

## 2012-12-08 NOTE — Progress Notes (Signed)
Patient ID: Bradley [name], male   DOB: 1928-07-29, 79 y.o.   MRN: [id number]    PCP: Henrine Screws, MD  Code Status: full code  No Known Allergies  Chief Complaint: new admit post hospitalization 12/01/12-12/07/12  HPI:  Bradley [name] is a 79 y.o. who was admitted to [facility] with severe left knee OA and underwent left total knee arthroplasty. he tolerated the procedure well, was given PO and IV analgesics for pain control and started on lovenox forDVT prophylaxis. He was followed by PT/OT. He received 2 u PRBC with hb of 7.0. He had uti and urinary retention requiring foley catheter. He was started on IV Rocephin every 12 hours. he developed hiccups, belching and nausea and also had constipation during his stay. He was sent to SNF for [facility] He was seen in his room today. He complaints of worsening cough with phlegm production and feelign of difficulty swallowing. He also has persistent hiccups. . He also feels gasey. He mentions that his pain is under control. No other complaints  Review of Systems: Review of Systems  Constitutional: Negative for fever, chills and diaphoresis.  HENT: Negative for congestion.   Eyes: Negative for blurred vision.  Respiratory: Positive for cough. Negative for shortness of breath.   Cardiovascular: Negative for chest pain and palpitations.  Gastrointestinal: Positive for nausea. Negative for heartburn, vomiting and abdominal pain.  Genitourinary: Negative for dysuria.  Musculoskeletal: Negative for falls.  Skin: Negative for itching and rash.  Neurological: Negative for dizziness, tremors, weakness and headaches.  Psychiatric/Behavioral: Negative for depression.    Past Medical History  Diagnosis Date  . Hypertension   . Cataract   . Syncope and collapse 10/15/2012    NEUROLOGIC EXAM - NO ACUTE FINDINGS - BUT "SIGNIFICANT ATHEROSCLEROTIC VASCULAR BURDEN BASED ON MRA OF BRAIN" - PER DISCHARGE SUMMARY 10/17/12 [place]  . Pneumonia  1980's?  Marland Kitchen GERD (gastroesophageal reflux disease)   . UTI (urinary tract infection)     UTI DIAGNOSED AND TREATED WHEN PT HOSP FOR SYNCOPE 10/15/12  . Strabismus     RIGHT EYE  . BPH (benign prostatic hypertrophy)   . PPD positive     HX POSITIVE PPD WITH NEGATIVE CXR  . Varicosities of leg     LEFT  . Chronic kidney disease     CHRONIC RENAL INSUFFICIENCY - CREATININE 1.8 TO 1.9  . Dysrhythmia     "VENTRICULAR ECTOPY"  --NEGATIVE STRESS TEST FOR CORONARY ISCHEMIA, MARCH 2008 - PER OFFICE NOTES DR. R. N. GATES.  Marland Kitchen Arthritis     "knees" (10/15/2012)   Past Surgical History  Procedure Laterality Date  . Transurethral resection of prostate  2006  . Cataract extraction w/ intraocular lens implant      BOTH EYES  . Tonsillectomy      "I was young" (10/15/2012)  . Total knee arthroplasty Left 12/01/2012    Procedure: LEFT TOTAL KNEE ARTHROPLASTY;  Surgeon: [name], MD;  Location: [facility];  Service: Orthopedics;  Laterality: Left;   Social History:   reports that he has never smoked. He has never used smokeless tobacco. He reports that he does not drink alcohol or use illicit drugs.  No family history on file.  Medications: Patient's Medications  New Prescriptions   No medications on file  Previous Medications   ACETAMINOPHEN (TYLENOL) 325 MG TABLET    Take 2 tablets (650 mg total) by mouth every 6 (six) hours as needed.   ALUM & MAG HYDROXIDE-SIMETH (  MAALOX/MYLANTA) 200-200-20 MG/5ML SUSPENSION    Take 30 mLs by mouth every 4 (four) hours as needed.   BISACODYL (DULCOLAX) 10 MG SUPPOSITORY    Place 1 suppository (10 mg total) rectally daily as needed.   CYCLOBENZAPRINE (FLEXERIL) 10 MG TABLET    Take 1 tablet (10 mg total) by mouth 3 (three) times daily as needed for muscle spasms.   DIPHENHYDRAMINE (BENADRYL) 12.5 MG/5ML ELIXIR    Take 5-10 mLs (12.5-25 mg total) by mouth every 4 (four) hours as needed for itching.   DOCUSATE SODIUM 100 MG CAPS    Take 100 mg by mouth 2 (two)  times daily.   ENOXAPARIN (LOVENOX) 30 MG/0.3ML INJECTION    Inject 0.3 mLs (30 mg total) into the skin every 12 (twelve) hours. Lovenox injections twice a day for six more days and then resume ASA 325 mg daily.   HYDROCHLOROTHIAZIDE (HYDRODIURIL) 12.5 MG TABLET    Take 12.5 mg by mouth. TAKES EVERY OTHER DAY   HYDROCODONE-ACETAMINOPHEN (NORCO/VICODIN) 5-325 MG PER TABLET    Take 1-2 tablets by mouth every 4 (four) hours as needed.   METOCLOPRAMIDE (REGLAN) 5 MG TABLET    Take 1 tablet (5 mg total) by mouth every 8 (eight) hours as needed (if ondansetron (ZOFRAN) ineffective.).   OMEPRAZOLE (PRILOSEC) 20 MG CAPSULE    Take 20 mg by mouth daily.   ONDANSETRON (ZOFRAN) 4 MG TABLET    Take 1 tablet (4 mg total) by mouth every 6 (six) hours as needed for nausea.   POLYETHYLENE GLYCOL (MIRALAX / GLYCOLAX) PACKET    Take 17 g by mouth daily. Hold if loose stool or diarrhea   SODIUM PHOSPHATE (FLEET) 7-19 GM/118ML ENEM    Place 1 enema rectally daily as needed.   TRAMADOL (ULTRAM) 50 MG TABLET    Take 1-2 tablets (50-100 mg total) by mouth every 6 (six) hours as needed (mild pain).  Modified Medications   No medications on file  Discontinued Medications   OVER THE COUNTER MEDICATION    OVER THE COUNTER STOOL SOFTNERS DAILY AS NEEDED    Physical Exam: Filed Vitals:   12/08/12 1637  BP: 136/76  Pulse: 90  Temp: 98.8 F (37.1 C)  Resp: 18  SpO2: 97%   Physical Exam  Constitutional: He is oriented to person, place, and time.  Frail elderly male in no distress  HENT:  Head: Normocephalic and atraumatic.  Mouth/Throat: Oropharynx is clear and moist. No oropharyngeal exudate.  Eyes: Pupils are equal, round, and reactive to light.  Neck: Normal range of motion. Neck supple. No JVD present. No tracheal deviation present. No thyromegaly present.  Cardiovascular: Normal rate and regular rhythm.   Pulmonary/Chest: Effort normal. No stridor. No respiratory distress.  Decreased air entry bilaterally  at bases  Abdominal: Soft. He exhibits no mass. There is no tenderness. There is no rebound and no guarding.  Hyperactive bowel sounds, bloated  Musculoskeletal: Normal range of motion. He exhibits edema and tenderness.  Limited rom in left knee, steristrips in place, edema and bruise present, no hematoma  Lymphadenopathy:    He has no cervical adenopathy.  Neurological: He is alert and oriented to person, place, and time. No cranial nerve deficit.  Skin: Skin is warm and dry.  Psychiatric: He has a normal mood and affect.    Labs reviewed: Basic Metabolic Panel:  Recent Labs  12/05/12 0505 12/06/12 0730 12/07/12 0440  NA 134* 133* 135  K 4.5 4.5 4.3  CL 100 99  99  CO2 27 25 29   GLUCOSE 97 104* 116*  BUN 38* 30* 26*  CREATININE 1.87* 1.56* 1.59*  CALCIUM 8.1* 8.4 8.6   Liver Function Tests:  Recent Labs  11/24/12 1235  AST 13  ALT 11  ALKPHOS 70  BILITOT 0.4  PROT 6.8  ALBUMIN 3.4*   CBC:  Recent Labs  10/15/12 1024  12/05/12 0505 12/06/12 0730 12/07/12 0440  WBC 4.0  < > 7.0 7.8 9.4  NEUTROABS 2.5  --   --   --   --   HGB 10.9*  < > 8.6* 8.6* 9.1*  HCT 34.4*  < > 26.1* 25.5* 27.6*  MCV 86.0  < > 84.2 84.4 84.7  PLT 193  < > 145* 173 197  < > = values in this interval not displayed. Cardiac Enzymes:  Recent Labs  10/15/12 1026  TROPONINI <0.30   CBG:  Recent Labs  10/16/12 2058 10/17/12 0740 10/17/12 1121  GLUCAP 110* 94 85    Assessment/Plan Intractable hiccups- could be multifactorial-  A) postoperative--he was recently under general anasthesia and was intubated (visceral irritation and or glottic stimulation) is most likely cause given recent procedure. B) GERD insetting of persistent cough is another possibility. Will increase omeprazole to 40 mg daily for now. C) Pneumonia or bronchitis can be another cause. Will get cxr. Encouraged to use incentive spirometer.D) hyponatremia could be contributing some. Phrenic nerve injury/irritation can  be another cause (possible mediastinal mass or AVM) but acute onset again makes this less likely. cxr can show elevated diaphragm if present raising concerns for phrenic nerve injury. No enlarged thryoid, no palpable LN.No signs of focal neurologic deficit on exam, he is alert and oriented making cns causes like meningitis/ encephalitis less likely at present. If cxr is abnormal, will consider further getting ct scan chest. Might need gi referral if this persists for possible manometry study. Will check lft, bmp, amylase, lipase, cbc. Have provided SLP referral to help educate and provide valsalva maneuver and vagal stimualtion and see if this helps. On metoclopramide 5 mg tid prn. Will change this to 10 mg po tid for a week for now and reassess.   UTI- complete antibiotic course, monitor clinically  Anemia- likely from acute blood loss during surgery, s/p transfusion. Check cbc  Left knee OA- s/p arthroplasty. Pain under control. Incision site healing well. Continue current pain regimen and wound care. To follow with ortho. On bowel regimen. Will add simethicone for flatus.  Cough- could be from gerd. Will increase omeprazole to 40 mg daily. Will also get cxr given decreased air entry and worsening cough for now. Will get SLP eval to rule out dysphagia.   Labs/tests ordered Cbc, cmp, amylase, lipase,S LP eval

## 2012-12-08 NOTE — Progress Notes (Signed)
Clinical Social Work Department CLINICAL SOCIAL WORK PLACEMENT NOTE 12/08/2012  Patient:  Bradley [name], Bradley [name]  Account Number:  [id number] Admit date:  12/01/2012  Clinical Social Worker:  Werner Lean, LCSW  Date/time:  12/02/2012 12:44 PM  Clinical Social Work is seeking post-discharge placement for this patient at the following level of care:   SKILLED NURSING   (*CSW will update this form in Epic as items are completed)   12/02/2012  Patient/family provided with [place] Department of Clinical Social Work's list of facilities offering this level of care within the geographic area requested by the patient (or if unable, by the patient's family).  12/02/2012  Patient/family informed of their freedom to choose among providers that offer the needed level of care, that participate in Medicare, Medicaid or managed care program needed by the patient, have an available bed and are willing to accept the patient.    Patient/family informed of [facility]' ownership interest in [facility], as well as of the fact that they are under no obligation to receive care at this facility.  PASARR submitted to EDS on 12/01/2012 PASARR number received from EDS on 12/01/2012  FL2 transmitted to all facilities in geographic area requested by pt/family on  12/02/2012 FL2 transmitted to all facilities within larger geographic area on   Patient informed that his/her managed care company has contracts with or will negotiate with  certain facilities, including the following:     Patient/family informed of bed offers received:  12/03/2012 Patient chooses bed at [facility] Physician recommends and patient chooses bed at    Patient to be transferred to [place] on  12/07/2012 Patient to be transferred to facility by EMS  The following physician request were entered in Epic:   Additional Comments: Werner Lean LCSW [phone]

## 2012-12-26 ENCOUNTER — Non-Acute Institutional Stay (SKILLED_NURSING_FACILITY): Payer: Medicare Other | Admitting: Nurse Practitioner

## 2012-12-26 ENCOUNTER — Encounter: Payer: Self-pay | Admitting: Nurse Practitioner

## 2012-12-26 DIAGNOSIS — M1710 Unilateral primary osteoarthritis, unspecified knee: Secondary | ICD-10-CM

## 2012-12-26 MED ORDER — SIMETHICONE 180 MG PO CAPS: 1.0000 | ORAL_CAPSULE | Freq: Three times a day (TID) | ORAL | Status: DC

## 2012-12-26 MED ORDER — ACETAMINOPHEN 325 MG PO TABS: 650.0000 mg | ORAL_TABLET | Freq: Four times a day (QID) | ORAL | Status: DC | PRN

## 2012-12-26 MED ORDER — OMEPRAZOLE 20 MG PO CPDR: 40.0000 mg | DELAYED_RELEASE_CAPSULE | Freq: Every day | ORAL | Status: DC

## 2012-12-26 MED ORDER — POLYETHYLENE GLYCOL 3350 17 G PO PACK: 17.0000 g | PACK | Freq: Every day | ORAL | Status: DC

## 2012-12-26 MED ORDER — DSS 100 MG PO CAPS: 100.0000 mg | ORAL_CAPSULE | Freq: Two times a day (BID) | ORAL | Status: DC

## 2012-12-26 MED ORDER — CYCLOBENZAPRINE HCL 10 MG PO TABS: 10.0000 mg | ORAL_TABLET | Freq: Three times a day (TID) | ORAL | Status: DC | PRN

## 2012-12-26 MED ORDER — HYDROCHLOROTHIAZIDE 12.5 MG PO TABS: ORAL_TABLET | ORAL | Status: DC

## 2012-12-26 MED ORDER — ASPIRIN 325 MG PO TBEC: 325.0000 mg | DELAYED_RELEASE_TABLET | Freq: Every day | ORAL | Status: DC

## 2013-03-22 NOTE — Progress Notes (Signed)
  Subjective:    Patient ID: Bradley [name], male    DOB: 21-Feb-1928, 79 y.o.   MRN: [id number]  HPI Comments: Pt was seen today for discharge from str.      Review of Systems  All other systems reviewed and are negative.       Objective:   Physical Exam  Vitals reviewed. Constitutional: He appears well-developed.  Eyes: Pupils are equal, round, and reactive to light.  Cardiovascular: Normal rate.   Pulmonary/Chest: Effort normal and breath sounds normal.  Neurological: He is alert.  Skin: Skin is warm and dry.  Psychiatric: He has a normal mood and affect.          Assessment & Plan:  Discharge home with home health. Follow up with pcp in 1- 2 weeks and ortho as directed. 30 day rx of current meds written and given to pt. See med list.

## 2013-05-21 ENCOUNTER — Ambulatory Visit: Payer: Medicare Other | Admitting: Podiatry

## 2013-06-01 ENCOUNTER — Ambulatory Visit (INDEPENDENT_AMBULATORY_CARE_PROVIDER_SITE_OTHER): Payer: Medicare Other | Admitting: Podiatry

## 2013-06-01 ENCOUNTER — Encounter: Payer: Self-pay | Admitting: Podiatry

## 2013-06-01 VITALS — BP 103/74 | HR 64 | Resp 16

## 2013-06-01 DIAGNOSIS — B351 Tinea unguium: Secondary | ICD-10-CM

## 2013-06-01 DIAGNOSIS — M79609 Pain in unspecified limb: Secondary | ICD-10-CM

## 2013-06-02 NOTE — Progress Notes (Signed)
Subjective:     Patient ID: Bradley [name], male   DOB: 06-May-1928, 79 y.o.   MRN: [id number]  HPI patient states my nails are thick and they are becoming painful   Review of Systems     Objective:   Physical Exam  Nursing note and vitals reviewed. Constitutional: He is oriented to person, place, and time.  Neurological: He is oriented to person, place, and time.  Skin: Skin is dry.   neurovascular status diminished both feet and nail disease with thickness and pain 1-5 both feet     Assessment:     Mycotic nail infection with pain 1-5 both feet    Plan:     Debridement of nails 1-5 both feet with no iatrogenic bleeding noted

## 2013-09-07 ENCOUNTER — Encounter: Payer: Self-pay | Admitting: Podiatry

## 2013-09-07 ENCOUNTER — Ambulatory Visit (INDEPENDENT_AMBULATORY_CARE_PROVIDER_SITE_OTHER): Payer: Medicare Other | Admitting: Podiatry

## 2013-09-07 VITALS — BP 104/72 | HR 76 | Resp 12

## 2013-09-07 DIAGNOSIS — M79609 Pain in unspecified limb: Secondary | ICD-10-CM

## 2013-09-07 DIAGNOSIS — B351 Tinea unguium: Secondary | ICD-10-CM

## 2013-09-08 NOTE — Progress Notes (Signed)
Subjective:     Patient ID: Bradley [name], male   DOB: 03/05/1928, 80 y.o.   MRN: [id number]  HPI thick nail disease with pain 1-5 both feet that he cannot take care of himself   Review of Systems     Objective:   Physical Exam Neurovascular status intact with thick painful nailbeds 1-5 both feet    Assessment:     Mycotic nail infection with pain and deformity 1-5 both feet    Plan:     Debridement of painful nailbeds 1-5 both feet with no bleeding noted

## 2013-12-07 ENCOUNTER — Ambulatory Visit: Payer: 59 | Admitting: Podiatry

## 2014-05-03 ENCOUNTER — Ambulatory Visit (INDEPENDENT_AMBULATORY_CARE_PROVIDER_SITE_OTHER): Payer: 59 | Admitting: Podiatry

## 2014-05-03 ENCOUNTER — Encounter: Payer: Self-pay | Admitting: Podiatry

## 2014-05-03 DIAGNOSIS — M79673 Pain in unspecified foot: Secondary | ICD-10-CM

## 2014-05-03 DIAGNOSIS — B351 Tinea unguium: Secondary | ICD-10-CM

## 2014-05-03 NOTE — Progress Notes (Signed)
Subjective:     Patient ID: Bradley [name], male   DOB: 1928/05/10, 80 y.o.   MRN: [id number]  HPI patient presents with nail disease and thickness 1-5 both feet with pain noted   Review of Systems     Objective:   Physical Exam Neurovascular status unchanged with thick yellow brittle nailbeds 1-5 both feet    Assessment:     Mycotic nail infection with pain 1-5 both feet    Plan:     Debride painful nailbeds 1-5 both feet with no iatrogenic bleeding noted

## 2014-05-03 NOTE — Progress Notes (Signed)
   Subjective:    Patient ID: Bradley [name], male    DOB: 1928-06-03, 80 y.o.   MRN: [id number]  HPI Pt presents for nail debridement   Review of Systems     Objective:   Physical Exam        Assessment & Plan:

## 2014-08-09 ENCOUNTER — Ambulatory Visit (INDEPENDENT_AMBULATORY_CARE_PROVIDER_SITE_OTHER): Payer: Medicare Other | Admitting: Podiatry

## 2014-08-09 DIAGNOSIS — B351 Tinea unguium: Secondary | ICD-10-CM

## 2014-08-09 DIAGNOSIS — M79673 Pain in unspecified foot: Secondary | ICD-10-CM

## 2014-08-09 NOTE — Progress Notes (Signed)
Subjective:     Patient ID: Bradley [name], male   DOB: 10-10-1927, 81 y.o.   MRN: [id number]  HPI patient presents with nail disease and thickness 1-5 both feet with pain noted   Review of Systems     Objective:   Physical Exam Neurovascular status unchanged with thick yellow brittle nailbeds 1-5 both feet    Assessment:     Mycotic nail infection with pain 1-5 both feet    Plan:     Debride painful nailbeds 1-5 both feet with no iatrogenic bleeding noted

## 2014-08-25 ENCOUNTER — Encounter: Payer: Self-pay | Admitting: Interventional Cardiology

## 2014-08-25 ENCOUNTER — Ambulatory Visit (INDEPENDENT_AMBULATORY_CARE_PROVIDER_SITE_OTHER): Payer: Medicare Other | Admitting: Interventional Cardiology

## 2014-08-25 VITALS — BP 130/88 | HR 71 | Ht 73.0 in | Wt 199.0 lb

## 2014-08-25 DIAGNOSIS — N183 Chronic kidney disease, stage 3 unspecified: Secondary | ICD-10-CM

## 2014-08-25 DIAGNOSIS — N184 Chronic kidney disease, stage 4 (severe): Secondary | ICD-10-CM

## 2014-08-25 DIAGNOSIS — I10 Essential (primary) hypertension: Secondary | ICD-10-CM

## 2014-08-25 DIAGNOSIS — I209 Angina pectoris, unspecified: Secondary | ICD-10-CM

## 2014-08-25 DIAGNOSIS — R079 Chest pain, unspecified: Secondary | ICD-10-CM | POA: Insufficient documentation

## 2014-08-25 MED ORDER — NITROGLYCERIN 0.4 MG SL SUBL: 0.4000 mg | SUBLINGUAL_TABLET | SUBLINGUAL | Status: DC | PRN

## 2014-08-25 NOTE — Patient Instructions (Signed)
Your physician has recommended you make the following change in your medication:  1) START Nitro-glycerin as needed for chest pain, use as directed.  Your physician has requested that you have a lexiscan myoview. For further information please visit [email or web address]. Please follow instruction sheet, as given.  Your physician recommends that you schedule a follow-up appointment as needed

## 2014-08-25 NOTE — Progress Notes (Signed)
Patient ID: Bradley [name], male   DOB: 1928/04/21, 81 y.o.   MRN: [id number]    Cardiology Office Note   Date:  08/25/2014   ID:  Bradley [name], DOB 1927-09-11, MRN [id number]  PCP:  Henrine Screws, MD  Cardiologist:   Sinclair Grooms, MD   Chest pain    History of Present Illness: Bradley [name] is a 81 y.o. male who presents for evaluation of chest discomfort. He gives a six-month history of exertional precordial discomfort with activity such chest taking the trash baskets to the curb, extensive walking, or activities that require pulling and pushing. He has not had the discomfort to occur at rest. He has not had syncope or palpitations. There is no orthopnea or PND. He spoke with Dr. Inda Merlin concerning these complaints and was referred for further evaluation.    Past Medical History  Diagnosis Date  . Hypertension   . Cataract   . Syncope and collapse 10/15/2012    NEUROLOGIC EXAM - NO ACUTE FINDINGS - BUT "SIGNIFICANT ATHEROSCLEROTIC VASCULAR BURDEN BASED ON MRA OF BRAIN" - PER DISCHARGE SUMMARY 10/17/12 [place]  . Pneumonia 1980's?  Marland Kitchen GERD (gastroesophageal reflux disease)   . UTI (urinary tract infection)     UTI DIAGNOSED AND TREATED WHEN PT HOSP FOR SYNCOPE 10/15/12  . Strabismus     RIGHT EYE  . BPH (benign prostatic hypertrophy)   . PPD positive     HX POSITIVE PPD WITH NEGATIVE CXR  . Varicosities of leg     LEFT  . Chronic kidney disease     CHRONIC RENAL INSUFFICIENCY - CREATININE 1.8 TO 1.9  . Dysrhythmia     "VENTRICULAR ECTOPY"  --NEGATIVE STRESS TEST FOR CORONARY ISCHEMIA, MARCH 2008 - PER OFFICE NOTES DR. R. N. GATES.  Marland Kitchen Arthritis     "knees" (10/15/2012)    Past Surgical History  Procedure Laterality Date  . Transurethral resection of prostate  2006  . Cataract extraction w/ intraocular lens implant      BOTH EYES  . Tonsillectomy      "I was young" (10/15/2012)  . Total knee arthroplasty Left 12/01/2012    Procedure: LEFT TOTAL KNEE  ARTHROPLASTY;  Surgeon: [name], MD;  Location: [facility];  Service: Orthopedics;  Laterality: Left;     Current Outpatient Prescriptions  Medication Sig Dispense Refill  . acetaminophen (TYLENOL) 325 MG tablet Take 2 tablets (650 mg total) by mouth every 6 (six) hours as needed. (Patient taking differently: Take 650 mg by mouth every 6 (six) hours as needed for mild pain. ) 60 tablet 0  . aspirin 325 MG EC tablet Take 1 tablet (325 mg total) by mouth daily.    [name] Sodium (DSS) 100 MG CAPS Take 100 mg by mouth 2 (two) times daily.    . hydrochlorothiazide (HYDRODIURIL) 12.5 MG tablet TAKE one 12.5mg  tablet EVERY OTHER DAY. 15 tablet 0  . omeprazole (PRILOSEC) 20 MG capsule Take 2 capsules (40 mg total) by mouth daily.    . polyethylene glycol (MIRALAX / GLYCOLAX) packet Take 17 g by mouth daily. Hold if loose stool or diarrhea 14 each 0  . Simethicone 180 MG CAPS Take 1 capsule (180 mg total) by mouth 3 (three) times daily.    . nitroGLYCERIN (NITROSTAT) 0.4 MG SL tablet Place 1 tablet (0.4 mg total) under the tongue every 5 (five) minutes as needed. 25 tablet 3   No current facility-administered medications for this visit.  Allergies:   Review of patient's allergies indicates no known allergies.    Social History:  The patient  reports that he has never smoked. He has never used smokeless tobacco. He reports that he does not drink alcohol or use illicit drugs.   Family History:  The patient's negative for premature atherosclerosis. family history is not on file.    ROS:  Please see the history of present illness.   Otherwise, review of systems are positive for bilateral knee discomfort that limits physical activity..   All other systems are reviewed and negative.    PHYSICAL EXAM: VS:  BP 130/88 mmHg  Pulse 71  Ht 6\' 1"  (1.854 m)  Wt 199 lb (90.266 kg)  BMI 26.26 kg/m2  SpO2 98% , BMI Body mass index is 26.26 kg/(m^2). GEN: Well nourished, well developed, in no  acute distress HEENT: normal Neck: no JVD, carotid bruits, or masses Cardiac: Irregular intermittently and otherwise RRR; no murmurs, rubs, or gallops,no edema  Respiratory:  clear to auscultation bilaterally, normal work of breathing GI: soft, nontender, nondistended, + BS MS: no deformity or atrophy Skin: warm and dry, no rash Neuro:  Strength and sensation are intact Psych: euthymic mood, full affect   EKG:  EKG is not ordered today. The ekg from 07/28/2014 by Dr. Inda Merlin  demonstrates normal sinus rhythm with premature ventricular and atrial contractions, left atrial abnormality, and LVH with strain.   Recent Labs: No results found for requested labs within last 365 days.    Lipid Panel    Component Value Date/Time   CHOL 162 10/16/2012 0510   TRIG 110 10/16/2012 0510   HDL 39* 10/16/2012 0510   CHOLHDL 4.2 10/16/2012 0510   VLDL 22 10/16/2012 0510   LDLCALC 101* 10/16/2012 0510      Wt Readings from Last 3 Encounters:  08/25/14 199 lb (90.266 kg)  12/01/12 209 lb (94.802 kg)  11/24/12 209 lb (94.802 kg)      Other studies Reviewed: Additional studies/ records that were reviewed today include: Old records Dr. Inda Merlin office were reviewed.. Review of the above records demonstrates: Stage IV chronic kidney disease identified. Most recent creatinine on January 30 was 2.45.   ASSESSMENT AND PLAN:  1.  Exertional chest discomfort compatible with angina pectoris. I've advised him to rest when the discomfort is brought on by exertion. If discomfort last longer than 5 minutes he should take a sublingual nitroglycerin. We'll perform a Lexa scan myocardial perfusion study to risk stratify. 2. Hypertension with good control and EKG evidence of LVH 3. Stage IV chronic kidney disease, with creatinine greater than 2.4 when last checked in late December   Current medicines are reviewed at length with the patient today.  The patient does not have concerns regarding  medicines.  The following changes have been made:  We added sublingual nitroglycerin.  Labs/ tests ordered today include:   Orders Placed This Encounter  Procedures  . Myocardial Perfusion Imaging     Disposition:   FU with H Smith in as needed based upon findings .   Signed, Sinclair Grooms, MD  08/25/2014 3:20 PM    [facility] [address], [place], [place]  [address] Phone: [phone]; Fax: [phone]

## 2014-08-27 ENCOUNTER — Ambulatory Visit (HOSPITAL_COMMUNITY): Payer: Medicare Other | Attending: Internal Medicine | Admitting: Radiology

## 2014-08-27 DIAGNOSIS — I209 Angina pectoris, unspecified: Secondary | ICD-10-CM

## 2014-08-27 DIAGNOSIS — R079 Chest pain, unspecified: Secondary | ICD-10-CM | POA: Insufficient documentation

## 2014-08-27 DIAGNOSIS — I10 Essential (primary) hypertension: Secondary | ICD-10-CM | POA: Diagnosis not present

## 2014-08-27 DIAGNOSIS — R0602 Shortness of breath: Secondary | ICD-10-CM | POA: Insufficient documentation

## 2014-08-27 MED ORDER — TECHNETIUM TC 99M SESTAMIBI GENERIC - CARDIOLITE
33.0000 | Freq: Once | INTRAVENOUS | Status: AC | PRN
  Administered 2014-08-27: 33 via INTRAVENOUS

## 2014-08-27 MED ORDER — REGADENOSON 0.4 MG/5ML IV SOLN
0.4000 mg | Freq: Once | INTRAVENOUS | Status: AC
  Administered 2014-08-27: 0.4 mg via INTRAVENOUS

## 2014-08-27 MED ORDER — TECHNETIUM TC 99M SESTAMIBI GENERIC - CARDIOLITE
11.0000 | Freq: Once | INTRAVENOUS | Status: AC | PRN
  Administered 2014-08-27: 11 via INTRAVENOUS

## 2014-08-27 NOTE — Progress Notes (Signed)
[facility] [address] [place], [place] [address] [phone]    Cardiology Nuclear Med Study  Bradley [name] is a 80 y.o. male     MRN : [id number]     DOB: Nov 28, 1927  Procedure Date: 08/27/2014  Nuclear Med Background Indication for Stress Test:  Evaluation for Ischemia History: CKD, No Known CAD Cardiac Risk Factors: Hypertension  Symptoms:  Chest Pain and SOB    Nuclear Pre-Procedure Caffeine/Decaff Intake:  7:00am NPO After: 7:00am   Lungs:  clear O2 Sat: 98% on room air. IV 0.9% NS with Angio Cath:  22g  IV Site: R Antecubital  IV Started by:  Ileene Hutchinson, EMT-P  Chest Size (in):  46 Cup Size: n/a  Height: 6\' 1"  (1.854 m)  Weight:  199 lb (90.266 kg)  BMI:  Body mass index is 26.26 kg/(m^2). Tech Comments:  Small amount of decaf,    Nuclear Med Study 1 or 2 day study: 1 day  Stress Test Type:  Lexiscan  Reading MD: n/a  Order Authorizing Provider:  Dr.H Tamala Julian  Resting Radionuclide: Technetium 66m Sestamibi  Resting Radionuclide Dose: 11.0 mCi   Stress Radionuclide:  Technetium 27m Sestamibi  Stress Radionuclide Dose: 33.0 mCi           Stress Protocol Rest HR: 70 Stress HR: 75  Rest BP: 146/88 Stress BP: 146/105  Exercise Time (min): n/a METS: n/a   Predicted Max HR: 136 bpm % Max HR: 55.15 bpm Rate Pressure Product: 5625   Dose of Adenosine (mg):  n/a Dose of Lexiscan: 0.4 mg  Dose of Atropine (mg): n/a Dose of Dobutamine: n/a mcg/kg/min (at max HR)  Stress Test Technologist: Ileene Hutchinson, EMT-P  Nuclear Technologist:  Earl Many, CNMT     Rest Procedure:  Myocardial perfusion imaging was performed at rest 45 minutes following the intravenous administration of Technetium 76m Sestamibi. Rest ECG: NSR, LVH with repolarization changes, inferior MI, age undetermined  Stress Procedure:  The patient received IV Lexiscan 0.4 mg over 15-seconds.  Technetium 53m Sestamibi injected at 30-seconds.  Quantitative spect  images were obtained after a 45 minute delay. Stress ECG: frequent PVCs.  QPS Raw Data Images:  There is interference from nuclear activity from structures below the diaphragm. This does not affect the ability to read the study. Stress Images:  Decreased uptake in the entire inferior, basal and mid inferolateral wall.  Rest Images:  Decreased uptake in the entire inferior, basal and mid inferolateral wall.  Subtraction (SDS):  No evidence of ischemia. Transient Ischemic Dilatation (Normal <1.22):  1.06 Lung/Heart Ratio (Normal <0.45):  0.42  Quantitative Gated Spect Images QGS EDV:  316 ml QGS ESV:  245 ml  Impression Exercise Capacity:  Lexiscan with no exercise. BP Response:  Normal blood pressure response. Clinical Symptoms:  No symptoms. ECG Impression:  No significant ST segment change suggestive of ischemia. Comparison with Prior Nuclear Study: No images to compare  Overall Impression:  High risk stress nuclear study with a large scar in the entire inferior, basal and mid inferolateral wall with no ischemia. .  LV Ejection Fraction: 23%.  LV Wall Motion:  Severely decreased LVEF with diffuse hypokinesis and akinesis of the entire inferior and inferolateral walls.    Dorothy Spark 08/27/2014          3

## 2014-08-31 ENCOUNTER — Telehealth: Payer: Self-pay | Admitting: *Deleted

## 2014-08-31 MED ORDER — ISOSORBIDE MONONITRATE ER 60 MG PO TB24: 60.0000 mg | ORAL_TABLET | Freq: Every day | ORAL | Status: DC

## 2014-08-31 MED ORDER — CARVEDILOL 3.125 MG PO TABS: 3.1250 mg | ORAL_TABLET | Freq: Two times a day (BID) | ORAL | Status: DC

## 2014-08-31 NOTE — Telephone Encounter (Signed)
Notes Recorded by Sinclair Grooms, MD on 08/30/2014 at 7:16 PM Needs to know that the scan showed evidence of a heart attack. Not sure when. Heart is weak. Start Imdur 60 mg daily. Start Carvedilol 3.125 mg BID. OV with me in 7 days.  08/31/14 pt advised, verbalized understanding, agreed with plan.

## 2014-09-01 NOTE — Telephone Encounter (Signed)
Pt aware of appt with Dr.Smith on 2/9 @ 8am. Pt not sure if he can keep that early of an appt. He will need to talk with his daughter and call back to confirm

## 2014-09-07 ENCOUNTER — Encounter: Payer: Self-pay | Admitting: Interventional Cardiology

## 2014-09-07 ENCOUNTER — Ambulatory Visit (INDEPENDENT_AMBULATORY_CARE_PROVIDER_SITE_OTHER): Payer: Medicare Other | Admitting: Interventional Cardiology

## 2014-09-07 VITALS — BP 138/64 | HR 68 | Ht 73.0 in | Wt 202.0 lb

## 2014-09-07 DIAGNOSIS — I209 Angina pectoris, unspecified: Secondary | ICD-10-CM

## 2014-09-07 DIAGNOSIS — I10 Essential (primary) hypertension: Secondary | ICD-10-CM

## 2014-09-07 DIAGNOSIS — I5042 Chronic combined systolic (congestive) and diastolic (congestive) heart failure: Secondary | ICD-10-CM | POA: Insufficient documentation

## 2014-09-07 DIAGNOSIS — I5022 Chronic systolic (congestive) heart failure: Secondary | ICD-10-CM

## 2014-09-07 MED ORDER — HYDRALAZINE HCL 25 MG PO TABS: 25.0000 mg | ORAL_TABLET | Freq: Two times a day (BID) | ORAL | Status: DC

## 2014-09-07 MED ORDER — ASPIRIN EC 81 MG PO TBEC: 81.0000 mg | DELAYED_RELEASE_TABLET | Freq: Every day | ORAL | Status: DC

## 2014-09-07 MED ORDER — FUROSEMIDE 40 MG PO TABS: 40.0000 mg | ORAL_TABLET | Freq: Every day | ORAL | Status: DC

## 2014-09-07 NOTE — Patient Instructions (Signed)
Your physician has recommended you make the following change in your medication:  1) STOP HCTZ 2) STOP ALEVE 3) START LASIX 40MG  DAILY. AN RX HAS BEEN SENT TO YOUR PHARMACY 4) START HYDRALAZINE 25MG  TWICE DAILY. AN RX HAS BEEN SENT TO YOUR PHARMACY 5) START ASPIRIN 81MG  DAILY. THIS CAN BE PURCHASED OVER THE COUNTER  Your physician recommends that you return for lab work ON 09/21/14 A BMET  YOU HAVE A FOLLOW UP APPOINTMENT SCHEDULED ON 09/21/14 @ 10AM

## 2014-09-07 NOTE — Progress Notes (Signed)
Cardiology Office Note   Date:  09/07/2014   ID:  Bradley [name], DOB 05-18-1928, MRN [id number]  PCP:  Henrine Screws, MD  Cardiologist:   Sinclair Grooms, MD   No chief complaint on file.     History of Present Illness: Bradley [name] is a 81 y.o. male who presents for follow-up after identification of severe decline in LV systolic function. A recent myocardial perfusion study demonstrated an EF less than 25%. He's been having vague chest discomfort. The nuclear study also demonstrates what appears to be a large inferior wall infarct. He is on inadequate heart failure therapy at this time. He is here today to discuss the way 4 for management of his problems. He does have chronic kidney disease with a creatinine that runs in the 1.6-1.9 range (2014).Marland Kitchen He is on hydrochlorothiazide. He has a difficult time characterizing chest complaints. His son readily admits that his father is very short of breath with activity. States that this is been present for at least 6 months.    Past Medical History  Diagnosis Date  . Hypertension   . Cataract   . Syncope and collapse 10/15/2012    NEUROLOGIC EXAM - NO ACUTE FINDINGS - BUT "SIGNIFICANT ATHEROSCLEROTIC VASCULAR BURDEN BASED ON MRA OF BRAIN" - PER DISCHARGE SUMMARY 10/17/12 [place]  . Pneumonia 1980's?  Marland Kitchen GERD (gastroesophageal reflux disease)   . UTI (urinary tract infection)     UTI DIAGNOSED AND TREATED WHEN PT HOSP FOR SYNCOPE 10/15/12  . Strabismus     RIGHT EYE  . BPH (benign prostatic hypertrophy)   . PPD positive     HX POSITIVE PPD WITH NEGATIVE CXR  . Varicosities of leg     LEFT  . Chronic kidney disease     CHRONIC RENAL INSUFFICIENCY - CREATININE 1.8 TO 1.9  . Dysrhythmia     "VENTRICULAR ECTOPY"  --NEGATIVE STRESS TEST FOR CORONARY ISCHEMIA, MARCH 2008 - PER OFFICE NOTES DR. R. N. GATES.  Marland Kitchen Arthritis     "knees" (10/15/2012)    Past Surgical History  Procedure Laterality Date  . Transurethral resection of  prostate  2006  . Cataract extraction w/ intraocular lens implant      BOTH EYES  . Tonsillectomy      "I was young" (10/15/2012)  . Total knee arthroplasty Left 12/01/2012    Procedure: LEFT TOTAL KNEE ARTHROPLASTY;  Surgeon: [name], MD;  Location: [facility];  Service: Orthopedics;  Laterality: Left;     Current Outpatient Prescriptions  Medication Sig Dispense Refill  . carvedilol (COREG) 3.125 MG tablet Take 1 tablet (3.125 mg total) by mouth 2 (two) times daily. 60 tablet 3  . ciprofloxacin (CIPRO) 500 MG tablet Take 500 mg by mouth 2 (two) times daily.     [name] Sodium (DSS) 100 MG CAPS Take 100 mg by mouth 2 (two) times daily.    . dorzolamide-timolol (COSOPT) 22.3-6.8 MG/ML ophthalmic solution     . isosorbide mononitrate (IMDUR) 60 MG 24 hr tablet Take 1 tablet (60 mg total) by mouth daily. 30 tablet 3  . nitroGLYCERIN (NITROSTAT) 0.4 MG SL tablet Place 1 tablet (0.4 mg total) under the tongue every 5 (five) minutes as needed. 25 tablet 3  . omeprazole (PRILOSEC) 20 MG capsule Take 2 capsules (40 mg total) by mouth daily.    Marland Kitchen aspirin EC 81 MG tablet Take 1 tablet (81 mg total) by mouth daily.    . furosemide (LASIX) 40 MG  tablet Take 1 tablet (40 mg total) by mouth daily. 30 tablet 11  . hydrALAZINE (APRESOLINE) 25 MG tablet Take 1 tablet (25 mg total) by mouth 2 (two) times daily. 60 tablet 11   No current facility-administered medications for this visit.    Allergies:   Review of patient's allergies indicates no known allergies.    Social History:  The patient  reports that he has never smoked. He has never used smokeless tobacco. He reports that he does not drink alcohol or use illicit drugs.   Family History:  The patient's family history includes Pneumonia in his father.    ROS:  Please see the history of present illness.   Otherwise, review of systems are positive for lower extremity swelling and occasional dizziness.   All other systems are reviewed and  negative.    PHYSICAL EXAM: VS:  BP 138/64 mmHg  Pulse 68  Ht 6\' 1"  (1.854 m)  Wt 202 lb (91.627 kg)  BMI 26.66 kg/m2 , BMI Body mass index is 26.66 kg/(m^2). GEN: Well nourished, well developed, in no acute distress HEENT: normal Neck: no JVD, carotid bruits, or masses Cardiac: Irregular RR; no murmurs, rubs, or gallops,no edema  Respiratory:  clear to auscultation bilaterally, normal work of breathing GI: soft, nontender, nondistended, + BS MS: no deformity or atrophy Skin: warm and dry, no rash Neuro:  Strength and sensation are intact Psych: euthymic mood, full affect   EKG:  EKG is not ordered today. The ekg ordered today demonstrates    Recent Labs: No results found for requested labs within last 365 days.    Lipid Panel    Component Value Date/Time   CHOL 162 10/16/2012 0510   TRIG 110 10/16/2012 0510   HDL 39* 10/16/2012 0510   CHOLHDL 4.2 10/16/2012 0510   VLDL 22 10/16/2012 0510   LDLCALC 101* 10/16/2012 0510      Wt Readings from Last 3 Encounters:  09/07/14 202 lb (91.627 kg)  08/27/14 199 lb (90.266 kg)  08/25/14 199 lb (90.266 kg)      Other studies Reviewed: Additional studies/ records that were reviewed today include: Review the nuclear results. Review of the above records demonstrates: LVEF less than 25%, high risk study, inferior infarct.   ASSESSMENT AND PLAN:  1.  New systolic heart failure, chronic of unknown duration. Exertional dyspnea and limitations qualify as class III functional status. Chronic kidney disease prevent [place] from using angiotensin/renin system inhibitors. We will advance beta blocker therapy and use nitrates/hydralazine. 2. Coronary artery disease with clear evidence of an inferior myocardial infarction, duration unknown 3. Long-standing hypertension, with borderline control. 4. Stage IV chronic kidney disease    Current medicines are reviewed at length with the patient today.  The patient does not have concerns  regarding medicines.  The following changes have been made:  Add hydralazine 25 mg twice daily; continue carvedilol 3.125 mg twice daily; start furosemide 40 mg daily  Labs/ tests ordered today include:   Orders Placed This Encounter  Procedures  . Basic metabolic panel     Disposition:   FU with Bradley [name] in 1 week   Signed, Sinclair Grooms, MD  09/07/2014 1:52 PM    [facility] [place], [place], [place]  [address] Phone: [phone]; Fax: [phone]

## 2014-09-21 ENCOUNTER — Inpatient Hospital Stay (HOSPITAL_COMMUNITY): Payer: Medicare Other

## 2014-09-21 ENCOUNTER — Emergency Department (HOSPITAL_COMMUNITY): Payer: Medicare Other

## 2014-09-21 ENCOUNTER — Encounter (HOSPITAL_COMMUNITY): Payer: Self-pay | Admitting: Emergency Medicine

## 2014-09-21 ENCOUNTER — Inpatient Hospital Stay (HOSPITAL_COMMUNITY)
Admission: EM | Admit: 2014-09-21 | Discharge: 2014-09-23 | DRG: 312 | Disposition: A | Discharge status: Home / Self Care | Payer: Medicare Other | Attending: Interventional Cardiology | Admitting: Interventional Cardiology

## 2014-09-21 ENCOUNTER — Encounter: Payer: Self-pay | Admitting: Interventional Cardiology

## 2014-09-21 ENCOUNTER — Ambulatory Visit (INDEPENDENT_AMBULATORY_CARE_PROVIDER_SITE_OTHER): Payer: Medicare Other | Admitting: Interventional Cardiology

## 2014-09-21 VITALS — BP 100/52 | HR 38 | Ht 73.0 in | Wt 189.6 lb

## 2014-09-21 DIAGNOSIS — I5022 Chronic systolic (congestive) heart failure: Secondary | ICD-10-CM

## 2014-09-21 DIAGNOSIS — Z79899 Other long term (current) drug therapy: Secondary | ICD-10-CM | POA: Diagnosis not present

## 2014-09-21 DIAGNOSIS — I509 Heart failure, unspecified: Secondary | ICD-10-CM | POA: Diagnosis not present

## 2014-09-21 DIAGNOSIS — R55 Syncope and collapse: Principal | ICD-10-CM | POA: Diagnosis present

## 2014-09-21 DIAGNOSIS — Z9842 Cataract extraction status, left eye: Secondary | ICD-10-CM | POA: Diagnosis not present

## 2014-09-21 DIAGNOSIS — I255 Ischemic cardiomyopathy: Secondary | ICD-10-CM | POA: Diagnosis present

## 2014-09-21 DIAGNOSIS — N183 Chronic kidney disease, stage 3 unspecified: Secondary | ICD-10-CM

## 2014-09-21 DIAGNOSIS — E875 Hyperkalemia: Secondary | ICD-10-CM | POA: Diagnosis present

## 2014-09-21 DIAGNOSIS — N400 Enlarged prostate without lower urinary tract symptoms: Secondary | ICD-10-CM | POA: Diagnosis present

## 2014-09-21 DIAGNOSIS — Z7982 Long term (current) use of aspirin: Secondary | ICD-10-CM | POA: Diagnosis not present

## 2014-09-21 DIAGNOSIS — I129 Hypertensive chronic kidney disease with stage 1 through stage 4 chronic kidney disease, or unspecified chronic kidney disease: Secondary | ICD-10-CM | POA: Diagnosis present

## 2014-09-21 DIAGNOSIS — I5042 Chronic combined systolic (congestive) and diastolic (congestive) heart failure: Secondary | ICD-10-CM | POA: Diagnosis present

## 2014-09-21 DIAGNOSIS — N184 Chronic kidney disease, stage 4 (severe): Secondary | ICD-10-CM | POA: Diagnosis present

## 2014-09-21 DIAGNOSIS — I5020 Unspecified systolic (congestive) heart failure: Secondary | ICD-10-CM | POA: Diagnosis present

## 2014-09-21 DIAGNOSIS — K219 Gastro-esophageal reflux disease without esophagitis: Secondary | ICD-10-CM | POA: Diagnosis present

## 2014-09-21 DIAGNOSIS — R001 Bradycardia, unspecified: Secondary | ICD-10-CM | POA: Diagnosis present

## 2014-09-21 DIAGNOSIS — Z9841 Cataract extraction status, right eye: Secondary | ICD-10-CM

## 2014-09-21 DIAGNOSIS — Z96652 Presence of left artificial knee joint: Secondary | ICD-10-CM | POA: Diagnosis present

## 2014-09-21 DIAGNOSIS — I2510 Atherosclerotic heart disease of native coronary artery without angina pectoris: Secondary | ICD-10-CM | POA: Diagnosis present

## 2014-09-21 DIAGNOSIS — I10 Essential (primary) hypertension: Secondary | ICD-10-CM

## 2014-09-21 DIAGNOSIS — Z961 Presence of intraocular lens: Secondary | ICD-10-CM | POA: Diagnosis present

## 2014-09-21 DIAGNOSIS — N390 Urinary tract infection, site not specified: Secondary | ICD-10-CM | POA: Diagnosis present

## 2014-09-21 HISTORY — DX: Cardiomyopathy, unspecified: I42.9

## 2014-09-21 LAB — URINALYSIS, ROUTINE W REFLEX MICROSCOPIC
Bilirubin Urine: NEGATIVE
GLUCOSE, UA: NEGATIVE mg/dL
Ketones, ur: NEGATIVE mg/dL
NITRITE: NEGATIVE
PH: 6 (ref 5.0–8.0)
PROTEIN: NEGATIVE mg/dL
Specific Gravity, Urine: 1.011 (ref 1.005–1.030)
UROBILINOGEN UA: 0.2 mg/dL (ref 0.0–1.0)

## 2014-09-21 LAB — COMPREHENSIVE METABOLIC PANEL
ALT: 35 U/L (ref 0–53)
ANION GAP: 6 (ref 5–15)
AST: 30 U/L (ref 0–37)
Albumin: 3.5 g/dL (ref 3.5–5.2)
Alkaline Phosphatase: 72 U/L (ref 39–117)
BILIRUBIN TOTAL: 0.9 mg/dL (ref 0.3–1.2)
BUN: 42 mg/dL — AB (ref 6–23)
CALCIUM: 9.3 mg/dL (ref 8.4–10.5)
CHLORIDE: 106 mmol/L (ref 96–112)
CO2: 27 mmol/L (ref 19–32)
CREATININE: 2.63 mg/dL — AB (ref 0.50–1.35)
GFR calc non Af Amer: 21 mL/min — ABNORMAL LOW (ref >90)
GFR, EST AFRICAN AMERICAN: 24 mL/min — AB (ref >90)
GLUCOSE: 84 mg/dL (ref 70–99)
Potassium: 5.1 mmol/L (ref 3.5–5.1)
Sodium: 139 mmol/L (ref 135–145)
Total Protein: 6.8 g/dL (ref 6.0–8.3)

## 2014-09-21 LAB — CBC WITH DIFFERENTIAL/PLATELET
BASOS ABS: 0 10*3/uL (ref 0.0–0.1)
Basophils Relative: 0 % (ref 0–1)
EOS PCT: 4 % (ref 0–5)
Eosinophils Absolute: 0.3 10*3/uL (ref 0.0–0.7)
HCT: 38.3 % — ABNORMAL LOW (ref 39.0–52.0)
HEMOGLOBIN: 12 g/dL — AB (ref 13.0–17.0)
LYMPHS PCT: 16 % (ref 12–46)
Lymphs Abs: 1 10*3/uL (ref 0.7–4.0)
MCH: 27.3 pg (ref 26.0–34.0)
MCHC: 31.3 g/dL (ref 30.0–36.0)
MCV: 87.2 fL (ref 78.0–100.0)
Monocytes Absolute: 0.6 10*3/uL (ref 0.1–1.0)
Monocytes Relative: 9 % (ref 3–12)
NEUTROS PCT: 71 % (ref 43–77)
Neutro Abs: 4.3 10*3/uL (ref 1.7–7.7)
PLATELETS: 187 10*3/uL (ref 150–400)
RBC: 4.39 MIL/uL (ref 4.22–5.81)
RDW: 12.6 % (ref 11.5–15.5)
WBC: 6.2 10*3/uL (ref 4.0–10.5)

## 2014-09-21 LAB — APTT: aPTT: 37 seconds (ref 24–37)

## 2014-09-21 LAB — MRSA PCR SCREENING: MRSA BY PCR: POSITIVE — AB

## 2014-09-21 LAB — PROTIME-INR
INR: 1.09 (ref 0.00–1.49)
PROTHROMBIN TIME: 14.2 s (ref 11.6–15.2)

## 2014-09-21 LAB — TROPONIN I: Troponin I: <0.03 ng/mL (ref <0.031)

## 2014-09-21 LAB — URINE MICROSCOPIC-ADD ON

## 2014-09-21 LAB — BRAIN NATRIURETIC PEPTIDE: B NATRIURETIC PEPTIDE 5: 1038.6 pg/mL — AB (ref 0.0–100.0)

## 2014-09-21 LAB — I-STAT TROPONIN, ED: Troponin i, poc: 0.03 ng/mL (ref 0.00–0.08)

## 2014-09-21 LAB — CBG MONITORING, ED: Glucose-Capillary: 78 mg/dL (ref 70–99)

## 2014-09-21 LAB — MAGNESIUM: Magnesium: 2 mg/dL (ref 1.5–2.5)

## 2014-09-21 LAB — TSH: TSH: 1.516 u[IU]/mL (ref 0.350–4.500)

## 2014-09-21 MED ORDER — MUPIROCIN 2 % EX OINT
1.0000 | TOPICAL_OINTMENT | Freq: Two times a day (BID) | CUTANEOUS | Status: DC
Start: 2014-09-21 — End: 2014-09-23
  Administered 2014-09-21 – 2014-09-23 (×4): 1 via NASAL
  Filled 2014-09-21: qty 22

## 2014-09-21 MED ORDER — ONDANSETRON HCL 4 MG/2ML IJ SOLN: 4.0000 mg | Freq: Four times a day (QID) | INTRAMUSCULAR | Status: DC | PRN

## 2014-09-21 MED ORDER — NITROGLYCERIN 0.4 MG SL SUBL: 0.4000 mg | SUBLINGUAL_TABLET | SUBLINGUAL | Status: DC | PRN

## 2014-09-21 MED ORDER — ASPIRIN EC 81 MG PO TBEC
81.0000 mg | DELAYED_RELEASE_TABLET | Freq: Every day | ORAL | Status: DC
  Administered 2014-09-22 – 2014-09-23 (×2): 81 mg via ORAL
  Filled 2014-09-21 (×2): qty 1

## 2014-09-21 MED ORDER — DOCUSATE SODIUM 100 MG PO CAPS
100.0000 mg | ORAL_CAPSULE | Freq: Two times a day (BID) | ORAL | Status: DC
Start: 2014-09-21 — End: 2014-09-23
  Administered 2014-09-21 – 2014-09-23 (×4): 100 mg via ORAL
  Filled 2014-09-21 (×6): qty 1

## 2014-09-21 MED ORDER — ISOSORBIDE MONONITRATE ER 60 MG PO TB24
60.0000 mg | ORAL_TABLET | Freq: Every day | ORAL | Status: DC
  Administered 2014-09-22 – 2014-09-23 (×2): 60 mg via ORAL
  Filled 2014-09-21 (×3): qty 1

## 2014-09-21 MED ORDER — HYDRALAZINE HCL 25 MG PO TABS
25.0000 mg | ORAL_TABLET | Freq: Two times a day (BID) | ORAL | Status: DC
  Administered 2014-09-21 – 2014-09-23 (×4): 25 mg via ORAL
  Filled 2014-09-21 (×6): qty 1

## 2014-09-21 MED ORDER — SODIUM CHLORIDE 0.9 % IV SOLN: 250.0000 mL | INTRAVENOUS | Status: DC | PRN

## 2014-09-21 MED ORDER — ACETAMINOPHEN 325 MG PO TABS: 650.0000 mg | ORAL_TABLET | ORAL | Status: DC | PRN

## 2014-09-21 MED ORDER — PANTOPRAZOLE SODIUM 40 MG PO TBEC
40.0000 mg | DELAYED_RELEASE_TABLET | Freq: Every day | ORAL | Status: DC
  Administered 2014-09-22 – 2014-09-23 (×2): 40 mg via ORAL
  Filled 2014-09-21 (×2): qty 1

## 2014-09-21 MED ORDER — CARVEDILOL 3.125 MG PO TABS
3.1250 mg | ORAL_TABLET | Freq: Two times a day (BID) | ORAL | Status: DC
  Administered 2014-09-21 – 2014-09-23 (×4): 3.125 mg via ORAL
  Filled 2014-09-21 (×6): qty 1

## 2014-09-21 MED ORDER — SODIUM CHLORIDE 0.9 % IJ SOLN: 3.0000 mL | INTRAMUSCULAR | Status: DC | PRN

## 2014-09-21 MED ORDER — DSS 100 MG PO CAPS
100.0000 mg | ORAL_CAPSULE | Freq: Two times a day (BID) | ORAL | Status: DC
Start: 2014-09-21 — End: 2014-09-21

## 2014-09-21 MED ORDER — DORZOLAMIDE HCL-TIMOLOL MAL 2-0.5 % OP SOLN
1.0000 [drp] | Freq: Two times a day (BID) | OPHTHALMIC | Status: DC
  Administered 2014-09-21 – 2014-09-23 (×4): 1 [drp] via OPHTHALMIC
  Filled 2014-09-21: qty 10

## 2014-09-21 MED ORDER — ENOXAPARIN SODIUM 30 MG/0.3ML ~~LOC~~ SOLN
30.0000 mg | SUBCUTANEOUS | Status: DC
  Administered 2014-09-21 – 2014-09-23 (×3): 30 mg via SUBCUTANEOUS
  Filled 2014-09-21 (×3): qty 0.3

## 2014-09-21 MED ORDER — ONDANSETRON HCL 4 MG/2ML IJ SOLN
4.0000 mg | Freq: Once | INTRAMUSCULAR | Status: AC
  Administered 2014-09-21: 4 mg via INTRAVENOUS
  Filled 2014-09-21: qty 2

## 2014-09-21 MED ORDER — FUROSEMIDE 40 MG PO TABS
40.0000 mg | ORAL_TABLET | Freq: Every day | ORAL | Status: DC
  Administered 2014-09-22 – 2014-09-23 (×2): 40 mg via ORAL
  Filled 2014-09-21 (×2): qty 1

## 2014-09-21 MED ORDER — SODIUM CHLORIDE 0.9 % IJ SOLN
3.0000 mL | Freq: Two times a day (BID) | INTRAMUSCULAR | Status: DC
  Administered 2014-09-21 – 2014-09-23 (×4): 3 mL via INTRAVENOUS

## 2014-09-21 MED ORDER — CHLORHEXIDINE GLUCONATE CLOTH 2 % EX PADS
6.0000 | MEDICATED_PAD | Freq: Every day | CUTANEOUS | Status: DC
  Administered 2014-09-22 – 2014-09-23 (×2): 6 via TOPICAL

## 2014-09-21 MED ORDER — CIPROFLOXACIN HCL 500 MG PO TABS
500.0000 mg | ORAL_TABLET | Freq: Two times a day (BID) | ORAL | Status: DC
  Filled 2014-09-21 (×2): qty 1

## 2014-09-21 NOTE — ED Provider Notes (Signed)
CSN: [id number]     Arrival date & time 09/21/14  1123 History   First MD Initiated Contact with Patient 09/21/14 1129     Chief Complaint  Patient presents with  . Loss of Consciousness     (Consider location/radiation/quality/duration/timing/severity/associated sxs/prior Treatment) HPI    PCP: GATES,ROBERT NEVILL, MD Blood pressure 105/58, pulse 74, temperature 98.1 F (36.7 C), temperature source Oral, resp. rate 22, height 6\' 1"  (1.854 m), weight 189 lb (85.73 kg), SpO2 99 %.  Bradley [name] is a 81 y.o.male with a significant PMH of hypertension, syncope and collapse, pneumonia, GERD, UTI, BPH, strabismus, arthritis presents to the ER by EMS from the cardiology Dr. Daneen Schick office after a syncopal episode. The patient reports waking up and feeling normal throughout the day. He had a routine check up with the cardiologist when he was noted to brady down and synopsized for 40 seconds. He was noted to have a pulse in the 20's. He regained consciousness and had an episode of vomiting. He currently admits to being mildly confused and not feeling well but denies any other symptoms at the time.  Past Medical History  Diagnosis Date  . Hypertension   . Cataract   . Syncope and collapse 10/15/2012    NEUROLOGIC EXAM - NO ACUTE FINDINGS - BUT "SIGNIFICANT ATHEROSCLEROTIC VASCULAR BURDEN BASED ON MRA OF BRAIN" - PER DISCHARGE SUMMARY 10/17/12 [place]  . Pneumonia 1980's?  Marland Kitchen GERD (gastroesophageal reflux disease)   . UTI (urinary tract infection)     UTI DIAGNOSED AND TREATED WHEN PT HOSP FOR SYNCOPE 10/15/12  . Strabismus     RIGHT EYE  . BPH (benign prostatic hypertrophy)   . PPD positive     HX POSITIVE PPD WITH NEGATIVE CXR  . Varicosities of leg     LEFT  . Chronic kidney disease     CHRONIC RENAL INSUFFICIENCY - CREATININE 1.8 TO 1.9  . Dysrhythmia     "VENTRICULAR ECTOPY"  --NEGATIVE STRESS TEST FOR CORONARY ISCHEMIA, MARCH 2008 - PER OFFICE NOTES DR. R. N. GATES.  Marland Kitchen Arthritis      "knees" (10/15/2012)   Past Surgical History  Procedure Laterality Date  . Transurethral resection of prostate  2006  . Cataract extraction w/ intraocular lens implant      BOTH EYES  . Tonsillectomy      "I was young" (10/15/2012)  . Total knee arthroplasty Left 12/01/2012    Procedure: LEFT TOTAL KNEE ARTHROPLASTY;  Surgeon: [name], MD;  Location: [facility];  Service: Orthopedics;  Laterality: Left;   Family History  Problem Relation Age of Onset  . Pneumonia Father    History  Substance Use Topics  . Smoking status: Never Smoker   . Smokeless tobacco: Never Used  . Alcohol Use: No    Review of Systems  10 Systems reviewed and are negative for acute change except as noted in the HPI.   Allergies  Review of patient's allergies indicates no known allergies.  Home Medications   Prior to Admission medications   Medication Sig Start Date End Date Taking? Authorizing Provider  aspirin EC 81 MG tablet Take 1 tablet (81 mg total) by mouth daily. 09/07/14   Belva Crome III, MD  carvedilol (COREG) 3.125 MG tablet Take 1 tablet (3.125 mg total) by mouth 2 (two) times daily. 08/31/14   Belva Crome III, MD  ciprofloxacin (CIPRO) 500 MG tablet Take 500 mg by mouth 2 (two) times daily.  08/23/14  Historical Provider, MD  Docusate Sodium (DSS) 100 MG CAPS Take 100 mg by mouth 2 (two) times daily. 12/26/12   Judeth Cornfield, NP  dorzolamide-timolol (COSOPT) 22.3-6.8 MG/ML ophthalmic solution 1 drop 2 (two) times daily.  08/21/14   Historical Provider, MD  furosemide (LASIX) 40 MG tablet Take 1 tablet (40 mg total) by mouth daily. 09/07/14   Belva Crome III, MD  hydrALAZINE (APRESOLINE) 25 MG tablet Take 1 tablet (25 mg total) by mouth 2 (two) times daily. 09/07/14   Belva Crome III, MD  isosorbide mononitrate (IMDUR) 60 MG 24 hr tablet Take 1 tablet (60 mg total) by mouth daily. 08/31/14   Belva Crome III, MD  nitroGLYCERIN (NITROSTAT) 0.4 MG SL tablet Place 1 tablet (0.4 mg total) under  the tongue every 5 (five) minutes as needed. 08/25/14   Belva Crome III, MD  omeprazole (PRILOSEC) 20 MG capsule Take 2 capsules (40 mg total) by mouth daily. 12/26/12   Judeth Cornfield, NP   BP 105/58 mmHg  Pulse 74  Temp(Src) 98.1 F (36.7 C) (Oral)  Resp 22  Ht 6\' 1"  (1.854 m)  Wt 189 lb (85.73 kg)  BMI 24.94 kg/m2  SpO2 99% Physical Exam  Constitutional: He appears well-developed and well-nourished. No distress.  HENT:  Head: Normocephalic and atraumatic. Head is without abrasion, without contusion and without laceration.  Eyes: Pupils are equal, round, and reactive to light.  Neck: Normal range of motion. Neck supple. Carotid bruit is not present.  Cardiovascular: Normal rate and regular rhythm.   Pulmonary/Chest: Effort normal and breath sounds normal. He has no decreased breath sounds. He has no wheezes.  Abdominal: Soft. Bowel sounds are normal. He exhibits no shifting dullness, no distension, no fluid wave and no pulsatile midline mass. There is no tenderness. There is no rebound, no guarding and no CVA tenderness.  Neurological: He is alert.  Pt slow to answer questions. Alert and oriented to person and time with some delay. Symmetrical lower extremity strength, asymmetrical upper extremity strength with deficit of weakness in the left arm- unsure if this is chronic or new.  Skin: Skin is warm and dry.  Nursing note and vitals reviewed.   ED Course  Procedures (including critical care time) Labs Review Labs Reviewed  CBC WITH DIFFERENTIAL/PLATELET  COMPREHENSIVE METABOLIC PANEL  CBG MONITORING, ED  I-STAT TROPOININ, ED    Imaging Review Ct Head Wo Contrast  09/21/2014   CLINICAL DATA:  Left upper extremity weakness of uncertain time span ; syncope  EXAM: CT HEAD WITHOUT CONTRAST  TECHNIQUE: Contiguous axial images were obtained from the base of the skull through the vertex without intravenous contrast.  COMPARISON:  Head CT October 15, 2012 skull brain MRI October 15, 2012   FINDINGS: There is mild diffuse atrophy. There is no intracranial mass, hemorrhage, extra-axial fluid collection, or midline shift. There is patchy small vessel disease in the centra semiovale bilaterally, stable. No new gray-white compartment lesions. No acute infarct apparent. The bony calvarium appears intact. The mastoid air cells are clear. There is debris in each external auditory canal.  IMPRESSION: Atrophy with patchy periventricular small vessel disease. No intracranial mass, hemorrhage, or acute appearing infarct. Probable cerumen in each external auditory canal.   Electronically Signed   By: Lowella Grip III M.D.   On: 09/21/2014 12:25     EKG Interpretation   Date/Time:  Tuesday September 21 2014 11:32:17 EST Ventricular Rate:  57 PR Interval:  247 QRS Duration:  122 QT Interval:  445 QTC Calculation: 433 R Axis:   84 Text Interpretation:  Sinus bradycardia Multiform ventricular premature  complexes Prolonged PR interval Consider left atrial enlargement Probable  LVH with secondary repol abnrm Since last tracing T wave inversion less  prominent PVC's now present Confirmed by Sabra Heck  MD, BRIAN ([id number]) on  09/21/2014 11:45:26 AM      MDM   Final diagnoses:  Syncope  Bradycardia    Dr. Daneen Schick has agreed to admit the patient. The patients work-up has not yet been complete but he has a negative head CT and negative Troponin. He has had episodes of brief bradycardia in the ER and cardioversion pads were placed as a precaution. CBG- 78.  Patient admitted to Dr. Tamala Julian, [facility]- Cardiology.  Filed Vitals:   09/21/14 1138  BP: 105/58  Pulse: 74  Temp: 98.1 F (36.7 C)  Resp: [id number] PA-C 09/21/14 1239  Johnna Acosta, MD 09/21/14 2108

## 2014-09-21 NOTE — ED Notes (Addendum)
During transport to CT, pt HR noted to have 2 beats at Bradley [name]. Unable to capture due to pt increase in HR to 60. Dr. Sabra Heck notified upon arrival back to room. Pt alert and oriented entire time.

## 2014-09-21 NOTE — Progress Notes (Signed)
Cardiology Office Note   Date:  09/21/2014   ID:  Bradley [name], DOB 10-06-1927, MRN [id number]  PCP:  Henrine Screws, MD  Cardiologist:   Sinclair Grooms, MD   No chief complaint on file.     History of Present Illness: Bradley [name] is a 81 y.o. male who presents for follow-up of newly diagnosed ischemic cardiomyopathy. Early in the interview the patient became unresponsive and began twitching while sitting in his chair quickly got the patient to the floor without trauma to his head or body. Upon getting on the floor after proximally 10 seconds he became progressively responsive. His skin was very diaphoretic. He did not complaining of chest pain or dyspnea prior or after the episode. He has a prior history of syncope.    Past Medical History  Diagnosis Date  . Hypertension   . Cataract   . Syncope and collapse 10/15/2012    NEUROLOGIC EXAM - NO ACUTE FINDINGS - BUT "SIGNIFICANT ATHEROSCLEROTIC VASCULAR BURDEN BASED ON MRA OF BRAIN" - PER DISCHARGE SUMMARY 10/17/12 [place]  . Pneumonia 1980's?  Marland Kitchen GERD (gastroesophageal reflux disease)   . UTI (urinary tract infection)     UTI DIAGNOSED AND TREATED WHEN PT HOSP FOR SYNCOPE 10/15/12  . Strabismus     RIGHT EYE  . BPH (benign prostatic hypertrophy)   . PPD positive     HX POSITIVE PPD WITH NEGATIVE CXR  . Varicosities of leg     LEFT  . Chronic kidney disease     CHRONIC RENAL INSUFFICIENCY - CREATININE 1.8 TO 1.9  . Dysrhythmia     "VENTRICULAR ECTOPY"  --NEGATIVE STRESS TEST FOR CORONARY ISCHEMIA, MARCH 2008 - PER OFFICE NOTES DR. R. N. GATES.  Marland Kitchen Arthritis     "knees" (10/15/2012)    Past Surgical History  Procedure Laterality Date  . Transurethral resection of prostate  2006  . Cataract extraction w/ intraocular lens implant      BOTH EYES  . Tonsillectomy      "I was young" (10/15/2012)  . Total knee arthroplasty Left 12/01/2012    Procedure: LEFT TOTAL KNEE ARTHROPLASTY;  Surgeon: [name], MD;   Location: [facility];  Service: Orthopedics;  Laterality: Left;     Current Outpatient Prescriptions  Medication Sig Dispense Refill  . aspirin EC 81 MG tablet Take 1 tablet (81 mg total) by mouth daily.    . carvedilol (COREG) 3.125 MG tablet Take 1 tablet (3.125 mg total) by mouth 2 (two) times daily. 60 tablet 3  . ciprofloxacin (CIPRO) 500 MG tablet Take 500 mg by mouth 2 (two) times daily.     [name] Sodium (DSS) 100 MG CAPS Take 100 mg by mouth 2 (two) times daily.    . dorzolamide-timolol (COSOPT) 22.3-6.8 MG/ML ophthalmic solution 1 drop 2 (two) times daily.     . furosemide (LASIX) 40 MG tablet Take 1 tablet (40 mg total) by mouth daily. 30 tablet 11  . hydrALAZINE (APRESOLINE) 25 MG tablet Take 1 tablet (25 mg total) by mouth 2 (two) times daily. 60 tablet 11  . isosorbide mononitrate (IMDUR) 60 MG 24 hr tablet Take 1 tablet (60 mg total) by mouth daily. 30 tablet 3  . nitroGLYCERIN (NITROSTAT) 0.4 MG SL tablet Place 1 tablet (0.4 mg total) under the tongue every 5 (five) minutes as needed. 25 tablet 3  . omeprazole (PRILOSEC) 20 MG capsule Take 2 capsules (40 mg total) by mouth daily.  No current facility-administered medications for this visit.   Past Surgical History  Procedure Laterality Date  . Transurethral resection of prostate  2006  . Cataract extraction w/ intraocular lens implant      BOTH EYES  . Tonsillectomy      "I was young" (10/15/2012)  . Total knee arthroplasty Left 12/01/2012    Procedure: LEFT TOTAL KNEE ARTHROPLASTY;  Surgeon: [name], MD;  Location: [facility];  Service: Orthopedics;  Laterality: Left;    Allergies:   Review of patient's allergies indicates no known allergies.    Social History:  The patient  reports that he has never smoked. He has never used smokeless tobacco. He reports that he does not drink alcohol or use illicit drugs.   Family History:  The patient's family history includes Pneumonia in his father.    ROS:  Please see  the history of present illness.   Otherwise, review of systems are positive for fatigue, and increased lethargy..   All other systems are reviewed and negative.    PHYSICAL EXAM: VS:  BP 100/52 mmHg  Pulse 38  Ht 6\' 1"  (1.854 m)  Wt 189 lb 9.6 oz (86.002 kg)  BMI 25.02 kg/m2  SpO2 99% , BMI Body mass index is 25.02 kg/(m^2). Eventual heart rate of 70 bpm. GEN: Well nourished, well developed, in no acute distress. Skin is very diaphoretic. HEENT: normal Neck: no JVD, carotid bruits, or masses Cardiac: IRR; no murmurs, rubs, or gallops,no edema  Respiratory:  clear to auscultation bilaterally, normal work of breathing GI: soft, nontender, nondistended, + BS MS: no deformity or atrophy Skin: warm and dry, no rash Neuro:  Strength and sensation are intact Psych: euthymic mood, full affect   EKG:  EKG is not ordered today. The ekg monitor rhythm is sinus bradycardia with uniform premature ventricular contractions.   Recent Labs: No results found for requested labs within last 365 days.    Lipid Panel    Component Value Date/Time   CHOL 162 10/16/2012 0510   TRIG 110 10/16/2012 0510   HDL 39* 10/16/2012 0510   CHOLHDL 4.2 10/16/2012 0510   VLDL 22 10/16/2012 0510   LDLCALC 101* 10/16/2012 0510      Wt Readings from Last 3 Encounters:  09/21/14 189 lb 9.6 oz (86.002 kg)  09/07/14 202 lb (91.627 kg)  08/27/14 199 lb (90.266 kg)      Other studies Reviewed: Additional studies/ records that were reviewed today include:.   ASSESSMENT AND PLAN:  1.  Syncope with associated diaphoresis and vomiting. Suspicious for vasovagal syncope. Rule out ventricular arrhythmia, high-grade AV block, other causes of transient cerebral hypoperfusion. 2.  Coronary artery disease recently identified by myocardial perfusion study showing a large inferior wall scar without evidence of ischemia on 08/30/2014. We discussed invasive evaluation on the last office visit and at that time the patient  preferred medical therapy rather than invasive approach. This was partly due to concern about kidney impairment. 3. Systolic heart failure, newly diagnosed and suspected to be related to ischemic heart disease. 4. Chronic kidney disease, stage III - IV. 5. Long-standing history of hypertension  DISPOSITION:    The patient will be transported [facility] the [facility] by EMS  He should be admitted to stepdown unit  We will search for the etiology of syncope.  He needs telemetry to exclude the possibility of ventricular arrhythmias given the known ischemic cardiomyopathy  Electrolytes and renal function will be monitored.  For the  time being and we will consider continuing his medications as listed above.   Signed, Sinclair Grooms, MD  09/21/2014 10:39 AM    [address], [place], [place]  [address] Phone: [phone]; Fax: [phone]

## 2014-09-21 NOTE — ED Notes (Signed)
Bradley [name] in main lab states she will add on magnesium to previous blood drawn

## 2014-09-21 NOTE — ED Provider Notes (Signed)
The patient is a 81 year old male, he has a history of decreased ejection fraction by echocardiogram 2 years ago, history of admission for near-syncope, at that time had CT scan and MRI, MRA of the brain showing old lacunar infarcts. He presents from the cardiologist office after being referred there because of having chest pain from his primary doctor. While at the cardiologist office the patient had a syncopal episode lasting approximately 45 seconds where he was completely unresponsive. He does not remember this episode but does remember waking up, feeling nauseated and vomiting multiple times. He denies feeling poorly this morning otherwise. At this time he is feeling better but still nauseated. On exam the patient has no peripheral edema, weakness of the left upper extremity compared to the right upper extremity, the patient is unsure if this is new, he is a fairly poor historian. Cardiac and pulmonary exams remarkable only for frequent ectopy and bradycardia. EKG, labs, anticipate admission to the hospital.  Medical screening examination/treatment/procedure(s) were conducted as a shared visit with non-physician practitioner(s) and myself.  I personally evaluated the patient during the encounter.  Clinical Impression:   Final diagnoses:  Syncope  Bradycardia        EKG Interpretation  Date/Time:  Tuesday September 21 2014 11:32:17 EST Ventricular Rate:  57 PR Interval:  247 QRS Duration: 122 QT Interval:  445 QTC Calculation: 433 R Axis:   84 Text Interpretation:  Sinus bradycardia Multiform ventricular premature complexes Prolonged PR interval Consider left atrial enlargement Probable LVH with secondary repol abnrm Since last tracing T wave inversion less prominent PVC's now present Confirmed by Sabra Heck  MD, [address] ([id number]) on 09/21/2014 11:45:26 AM        Johnna Acosta, MD 09/21/14 2109

## 2014-09-21 NOTE — ED Notes (Signed)
Spoke with pt son in law, states he will bring pt medication container to ED and is en route. Pt unable to say when he last had meds and what he took this morning.

## 2014-09-21 NOTE — H&P (Addendum)
ADMISSION NOTE   Date:  09/21/2014   ID:  Bradley [name], DOB 1928/01/02, MRN [id number]  PCP:  Henrine Screws, MD  Cardiologist:   Sinclair Grooms, MD   No chief complaint on file.     History of Present Illness: Bradley [name] is a 81 y.o. male who presents for follow-up of newly diagnosed ischemic cardiomyopathy. Early in the interview the patient became unresponsive and began twitching while sitting in his chair. My nurse and I  quickly got the patient to the floor without trauma to his head or body. Upon getting on the floor, after proximally 10 seconds, he became progressively responsive. His skin was very diaphoretic. He did not complain of chest pain or dyspnea prior or after the episode. He has a prior history of syncope. Upon awakening he had significant nausea and vomiting. EMS was summoned and he was sent to the emergency room where he will remain until admitted to a telemetry unit for further workup.  Had high risk nuclear within past 4 weeks but was reluctant to consider cath due to concern about kidney function and age. Nuclear does not reveal ischemia but has EF 23%. We were going to discuss further at the office visit but the syncopal episode occurred before we got around to it.    Past Medical History  Diagnosis Date  . Hypertension   . Cataract   . Syncope and collapse 10/15/2012    NEUROLOGIC EXAM - NO ACUTE FINDINGS - BUT "SIGNIFICANT ATHEROSCLEROTIC VASCULAR BURDEN BASED ON MRA OF BRAIN" - PER DISCHARGE SUMMARY 10/17/12 [place]  . Pneumonia 1980's?  Marland Kitchen GERD (gastroesophageal reflux disease)   . UTI (urinary tract infection)     UTI DIAGNOSED AND TREATED WHEN PT HOSP FOR SYNCOPE 10/15/12  . Strabismus     RIGHT EYE  . BPH (benign prostatic hypertrophy)   . PPD positive     HX POSITIVE PPD WITH NEGATIVE CXR  . Varicosities of leg     LEFT  . Chronic kidney disease     CHRONIC RENAL INSUFFICIENCY - CREATININE 1.8 TO 1.9  . Dysrhythmia      "VENTRICULAR ECTOPY"  --NEGATIVE STRESS TEST FOR CORONARY ISCHEMIA, MARCH 2008 - PER OFFICE NOTES DR. R. N. GATES.  Marland Kitchen Arthritis     "knees" (10/15/2012)    Past Surgical History  Procedure Laterality Date  . Transurethral resection of prostate  2006  . Cataract extraction w/ intraocular lens implant      BOTH EYES  . Tonsillectomy      "I was young" (10/15/2012)  . Total knee arthroplasty Left 12/01/2012    Procedure: LEFT TOTAL KNEE ARTHROPLASTY;  Surgeon: [name], MD;  Location: [facility];  Service: Orthopedics;  Laterality: Left;     Current Outpatient Prescriptions  Medication Sig Dispense Refill  . aspirin EC 81 MG tablet Take 1 tablet (81 mg total) by mouth daily.    . carvedilol (COREG) 3.125 MG tablet Take 1 tablet (3.125 mg total) by mouth 2 (two) times daily. 60 tablet 3  . ciprofloxacin (CIPRO) 500 MG tablet Take 500 mg by mouth 2 (two) times daily.     Bradley [name] Sodium (DSS) 100 MG CAPS Take 100 mg by mouth 2 (two) times daily.    . dorzolamide-timolol (COSOPT) 22.3-6.8 MG/ML ophthalmic solution 1 drop 2 (two) times daily.     . furosemide (LASIX) 40 MG tablet Take 1 tablet (40 mg total) by mouth daily. 30 tablet 11  .  hydrALAZINE (APRESOLINE) 25 MG tablet Take 1 tablet (25 mg total) by mouth 2 (two) times daily. 60 tablet 11  . isosorbide mononitrate (IMDUR) 60 MG 24 hr tablet Take 1 tablet (60 mg total) by mouth daily. 30 tablet 3  . nitroGLYCERIN (NITROSTAT) 0.4 MG SL tablet Place 1 tablet (0.4 mg total) under the tongue every 5 (five) minutes as needed. 25 tablet 3  . omeprazole (PRILOSEC) 20 MG capsule Take 2 capsules (40 mg total) by mouth daily.     No current facility-administered medications for this visit.   Past Surgical History  Procedure Laterality Date  . Transurethral resection of prostate  2006  . Cataract extraction w/ intraocular lens implant      BOTH EYES  . Tonsillectomy      "I was young" (10/15/2012)  . Total knee arthroplasty Left 12/01/2012     Procedure: LEFT TOTAL KNEE ARTHROPLASTY;  Surgeon: [name], MD;  Location: [facility];  Service: Orthopedics;  Laterality: Left;    Allergies:   Review of patient's allergies indicates no known allergies.    Social History:  The patient  reports that he has never smoked. He has never used smokeless tobacco. He reports that he does not drink alcohol or use illicit drugs.   Family History:  The patient's family history includes Pneumonia in his father.    ROS:  Please see the history of present illness.   Otherwise, review of systems are positive for fatigue, and increased lethargy..   All other systems are reviewed and negative.    PHYSICAL EXAM: VS:  BP 100/52 mmHg  Pulse 38  Ht 6\' 1"  (1.854 m)  Wt 189 lb 9.6 oz (86.002 kg)  BMI 25.02 kg/m2  SpO2 99% , BMI Body mass index is 25.02 kg/(m^2). Eventual heart rate of 70 bpm. GEN: Well nourished, well developed, in no acute distress. Skin is very diaphoretic. HEENT: normal Neck: no JVD, carotid bruits, or masses Cardiac: IRR; no murmurs, rubs, or gallops,no edema  Respiratory:  clear to auscultation bilaterally, normal work of breathing GI: soft, nontender, nondistended, + BS MS: no deformity or atrophy Skin: warm and dry, no rash Neuro:  Strength and sensation are intact Psych: euthymic mood, full affect   EKG:  EKG is not ordered today. The ekg monitor rhythm is sinus bradycardia with uniform premature ventricular contractions.   Recent Labs: No results found for requested labs within last 365 days.    Lipid Panel    Component Value Date/Time   CHOL 162 10/16/2012 0510   TRIG 110 10/16/2012 0510   HDL 39* 10/16/2012 0510   CHOLHDL 4.2 10/16/2012 0510   VLDL 22 10/16/2012 0510   LDLCALC 101* 10/16/2012 0510      Wt Readings from Last 3 Encounters:  09/21/14 189 lb 9.6 oz (86.002 kg)  09/07/14 202 lb (91.627 kg)  08/27/14 199 lb (90.266 kg)      Other studies Reviewed: Additional studies/ records that  were reviewed today include:.   ASSESSMENT AND PLAN:  1.  Syncope with associated diaphoresis and vomiting. Suspicious for vasovagal syncope. Rule out ventricular arrhythmia, high-grade AV block, other causes of transient cerebral hypoperfusion. 2.  Coronary artery disease recently identified by myocardial perfusion study showing a large inferior wall scar without evidence of ischemia on 08/30/2014. We discussed invasive evaluation on the last office visit and at that time the patient preferred medical therapy rather than invasive approach. This was partly due to concern about kidney impairment. 3.  Systolic heart failure, newly diagnosed and suspected to be related to ischemic heart disease. 4. Chronic kidney disease, stage III - IV. 5. Long-standing history of hypertension  DISPOSITION:     The patient will be transported to the [facility] by EMS  He should be admitted to stepdown unit  We will search for the etiology of syncope.  He needs telemetry to exclude the possibility of ventricular arrhythmias given the known ischemic cardiomyopathy  Electrolytes and renal function will be monitored.  For the time being and we will consider continuing his medications as listed above.   Signed, Sinclair Grooms, MD  09/21/2014 10:39 AM    [address], [place], [place]  [address] Phone: [phone]; Fax: [phone]

## 2014-09-21 NOTE — ED Notes (Signed)
Pt remains monitored by blood pressure, pulse ox, and 12 lead. pts family remains at bedside.  

## 2014-09-21 NOTE — ED Notes (Signed)
Attempted report 

## 2014-09-21 NOTE — ED Notes (Signed)
Pt daughter and son in law at bedside, daughter states that pt had all of his "am" medications today, states pt does not take cipro anymore he finished regimen for UTI.

## 2014-09-21 NOTE — ED Notes (Signed)
Per EMS: Pt from pcp office for eval of syncopal episode while sitting down, pt was helped to ground by staff and denies any injury to self. Pt went for check up on chest discomfort, facility reports 1 episode of emesis at pcp and was noted to have decreased hr of 20, pt hr immediately increased again to 60 per EMS report. No meds given en route, pt currently denies any pain at this time. NAD noted. Pt actively vomiting upon triage. See MAR.

## 2014-09-21 NOTE — ED Notes (Signed)
Patient transported to X-ray on zoll and transported by this RN

## 2014-09-21 NOTE — Progress Notes (Signed)
Nasal swab positive for MRSA.  Educated and placed pt on contact. Bradley [name]

## 2014-09-22 DIAGNOSIS — N390 Urinary tract infection, site not specified: Secondary | ICD-10-CM

## 2014-09-22 DIAGNOSIS — I10 Essential (primary) hypertension: Secondary | ICD-10-CM

## 2014-09-22 LAB — BASIC METABOLIC PANEL
ANION GAP: 3 — AB (ref 5–15)
Anion gap: 8 (ref 5–15)
BUN: 45 mg/dL — ABNORMAL HIGH (ref 6–23)
BUN: 46 mg/dL — AB (ref 6–23)
CHLORIDE: 102 mmol/L (ref 96–112)
CHLORIDE: 108 mmol/L (ref 96–112)
CO2: 26 mmol/L (ref 19–32)
CO2: 26 mmol/L (ref 19–32)
CREATININE: 2.75 mg/dL — AB (ref 0.50–1.35)
Calcium: 8.7 mg/dL (ref 8.4–10.5)
Calcium: 8.7 mg/dL (ref 8.4–10.5)
Creatinine, Ser: 2.74 mg/dL — ABNORMAL HIGH (ref 0.50–1.35)
GFR calc Af Amer: 23 mL/min — ABNORMAL LOW (ref >90)
GFR calc Af Amer: 23 mL/min — ABNORMAL LOW (ref >90)
GFR calc non Af Amer: 20 mL/min — ABNORMAL LOW (ref >90)
GFR calc non Af Amer: 20 mL/min — ABNORMAL LOW (ref >90)
Glucose, Bld: 87 mg/dL (ref 70–99)
Glucose, Bld: 117 mg/dL — ABNORMAL HIGH (ref 70–99)
Potassium: 5.4 mmol/L — ABNORMAL HIGH (ref 3.5–5.1)
Potassium: 5.1 mmol/L (ref 3.5–5.1)
SODIUM: 137 mmol/L (ref 135–145)
Sodium: 136 mmol/L (ref 135–145)

## 2014-09-22 LAB — TROPONIN I

## 2014-09-22 MED ORDER — CEFTRIAXONE SODIUM IN DEXTROSE 20 MG/ML IV SOLN
1.0000 g | INTRAVENOUS | Status: DC
  Administered 2014-09-22 – 2014-09-23 (×2): 1 g via INTRAVENOUS
  Filled 2014-09-22 (×2): qty 50

## 2014-09-22 NOTE — Evaluation (Signed)
Physical Therapy Evaluation Patient Details Name: Bradley [name] MRN: [id number] DOB: 02-Apr-1928 Today's Date: 09/22/2014   History of Present Illness  81 y.o.male with a significant PMH of hypertension, syncope and collapse, pneumonia, GERD, UTI, BPH, strabismus, arthritis presents to the ER by EMS from the cardiology Dr. Daneen Schick office after a syncopal episode.   Clinical Impression  Patient demonstrates deficits in functional mobility as indicated below. Will benefit from skilled PT to address deficits and maximize function. Will see as indicated and progress as tolerated. Recommend HHPT and initial supervision upon discharge.  OF NOTE: HR upper 60s to mid 70s with activity, occasional PVCs, SpO2 on room air 94% with activity.    Follow Up Recommendations Home health PT;Supervision - Intermittent    Equipment Recommendations  None recommended by PT    Recommendations for Other Services       Precautions / Restrictions Precautions Precautions: Fall Restrictions Weight Bearing Restrictions: No      Mobility  Bed Mobility Overal bed mobility: Modified Independent                Transfers Overall transfer level: Needs assistance Equipment used: 1 person hand held assist Transfers: Sit to/from Stand Sit to Stand: Min assist         General transfer comment: patient with posterior bias and instability requiring calves supported against bed to offset balance check, assist for stability  Ambulation/Gait Ambulation/Gait assistance: Min assist Ambulation Distance (Feet): 40 Feet Assistive device: 1 person hand held assist Gait Pattern/deviations: Step-through pattern;Decreased stride length;Antalgic;Trunk flexed Gait velocity: decreased Gait velocity interpretation: Below normal speed for age/gender General Gait Details: instability noted with gait, very slow cadence  Stairs            Wheelchair Mobility    Modified Rankin (Stroke Patients Only)        Balance Overall balance assessment: Needs assistance Sitting-balance support: Feet supported Sitting balance-Leahy Scale: Good     Standing balance support: No upper extremity supported Standing balance-Leahy Scale: Poor                               Pertinent Vitals/Pain Pain Assessment: No/denies pain    Home Living Family/patient expects to be discharged to:: Private residence Living Arrangements: Spouse/significant other   Type of Home: House Home Access: Stairs to enter   Technical brewer of Steps: 1 Home Layout: Two level Home Equipment: Cane - single point      Prior Function Level of Independence: Independent with assistive device(s)               Hand Dominance   Dominant Hand: Right    Extremity/Trunk Assessment   Upper Extremity Assessment: Overall WFL for tasks assessed           Lower Extremity Assessment: Overall WFL for tasks assessed (+ arthritic changes)         Communication   Communication: No difficulties  Cognition Arousal/Alertness: Awake/alert Behavior During Therapy: WFL for tasks assessed/performed Overall Cognitive Status: No family/caregiver present to determine baseline cognitive functioning                      General Comments      Exercises        Assessment/Plan    PT Assessment Patient needs continued PT services  PT Diagnosis Difficulty walking   PT Problem List Decreased activity tolerance;Decreased balance;Decreased mobility;Cardiopulmonary status limiting activity  PT Treatment Interventions DME instruction;Gait training;Stair training;Functional mobility training;Therapeutic activities;Therapeutic exercise;Balance training;Patient/family education   PT Goals (Current goals can be found in the Care Plan section) Acute Rehab PT Goals Patient Stated Goal: to go home PT Goal Formulation: With patient Time For Goal Achievement: 10/06/14 Potential to Achieve Goals: Good     Frequency Min 3X/week   Barriers to discharge        Co-evaluation               End of Session   Activity Tolerance: Patient limited by fatigue (+ dizziness) Patient left: in chair;with call bell/phone within reach Nurse Communication: Mobility status         Time: [id number] PT Time Calculation (min) (ACUTE ONLY): 17 min   Charges:   PT Evaluation $Initial PT Evaluation Tier I: 1 Procedure     PT G CodesDuncan Dull October 04, 2014, 9:41 AM Alben Deeds, PT DPT  [phone]

## 2014-09-22 NOTE — Progress Notes (Signed)
Progress Note  Subjective:    Doing well this AM, no dizziness or lightheadedness. Denies chest pain or palpitations.   Objective:   Temp:  [97.1 F (36.2 C)-98.5 F (36.9 C)] 97.9 F (36.6 C) (02/24 0725) Pulse Rate:  [38-93] 75 (02/24 0312) Resp:  [10-22] 10 (02/24 0725) BP: (100-152)/(52-98) 136/79 mmHg (02/24 0725) SpO2:  [93 %-100 %] 99 % (02/24 0725) Weight:  [189 lb (85.73 kg)-198 lb 6 oz (89.982 kg)] 190 lb 14.7 oz (86.6 kg) (02/24 0312) Last BM Date: 09/20/14  Filed Weights   09/21/14 1341 09/21/14 2006 09/22/14 0312  Weight: 198 lb 6 oz (89.982 kg) 192 lb 14.4 oz (87.5 kg) 190 lb 14.7 oz (86.6 kg)    Intake/Output Summary (Last 24 hours) at 09/22/14 [id number] Last data filed at 09/22/14 0153  Gross per 24 hour  Intake    120 ml  Output    625 ml  Net   -505 ml    Telemetry: NSR, PVC's, intermittent sinus bradycardia into the 40's  Physical Exam: General: Elderly AA male, alert, cooperative, NAD. HEENT: PERRL, EOMI. Moist mucus membranes Neck: Full range of motion without pain, supple, no lymphadenopathy or carotid bruits Lungs: Clear to ascultation bilaterally, normal work of respiration, no wheezes, rales, rhonchi Heart: RRR, no murmurs, gallops, or rubs Abdomen: Soft, non-tender, non-distended, BS + Extremities: No cyanosis, clubbing, or edema Neurologic: Alert & oriented x3, cranial nerves II-XII intact, strength grossly intact, sensation intact to light touch   Lab Results:  Basic Metabolic Panel:  Recent Labs Lab 09/21/14 1200 09/21/14 1300 09/22/14 0041  NA 139  --  137  K 5.1  --  5.4*  CL 106  --  108  CO2 27  --  26  GLUCOSE 84  --  87  BUN 42*  --  45*  CREATININE 2.63*  --  2.74*  CALCIUM 9.3  --  8.7  MG  --  2.0  --     Liver Function Tests:  Recent Labs Lab 09/21/14 1200  AST 30  ALT 35  ALKPHOS 72  BILITOT 0.9  PROT 6.8  ALBUMIN 3.5    CBC:  Recent Labs Lab 09/21/14 1200  WBC 6.2  HGB 12.0*  HCT 38.3*  MCV  87.2  PLT 187    Cardiac Enzymes:  Recent Labs Lab 09/21/14 1256 09/21/14 2037 09/22/14 0041  TROPONINI <0.03 <0.03 <0.03    Coagulation:  Recent Labs Lab 09/21/14 1256  INR 1.09    Radiology: Ct Head Wo Contrast  09/21/2014   CLINICAL DATA:  Left upper extremity weakness of uncertain time span ; syncope  EXAM: CT HEAD WITHOUT CONTRAST  TECHNIQUE: Contiguous axial images were obtained from the base of the skull through the vertex without intravenous contrast.  COMPARISON:  Head CT October 15, 2012 skull brain MRI October 15, 2012  FINDINGS: There is mild diffuse atrophy. There is no intracranial mass, hemorrhage, extra-axial fluid collection, or midline shift. There is patchy small vessel disease in the centra semiovale bilaterally, stable. No new gray-white compartment lesions. No acute infarct apparent. The bony calvarium appears intact. The mastoid air cells are clear. There is debris in each external auditory canal.  IMPRESSION: Atrophy with patchy periventricular small vessel disease. No intracranial mass, hemorrhage, or acute appearing infarct. Probable cerumen in each external auditory canal.   Electronically Signed   By: Lowella Grip III M.D.   On: 09/21/2014 12:25   Dg Chest [facility] [address]  09/21/2014   CLINICAL DATA:  Syncope and nausea.  EXAM: PORTABLE CHEST - 1 VIEW  COMPARISON:  10/15/2012  FINDINGS: The heart size is at the upper limits of normal. Pacing pads are present. There is stable elevation of the anterior right hemidiaphragm with basilar atelectasis. There is no evidence of pulmonary edema, consolidation, pneumothorax, nodule or pleural fluid. Visualized bony structures are unremarkable.  IMPRESSION: Top-normal heart size. Right basilar atelectasis with stable elevation of the anterior right hemidiaphragm.   Electronically Signed   By: Aletta Edouard M.D.   On: 09/21/2014 13:20    ECG: NSR w/ PVC's, rate 65. TWI V5, V6.    Medications:   Scheduled  Medications: . aspirin EC  81 mg Oral Daily  . carvedilol  3.125 mg Oral BID  . Chlorhexidine Gluconate Cloth  6 each Topical Q0600  . docusate sodium  100 mg Oral BID  . dorzolamide-timolol  1 drop Both Eyes BID  . enoxaparin (LOVENOX) injection  30 mg Subcutaneous Q24H  . furosemide  40 mg Oral Daily  . hydrALAZINE  25 mg Oral BID  . isosorbide mononitrate  60 mg Oral Daily  . mupirocin ointment  1 application Nasal BID  . pantoprazole  40 mg Oral Daily  . sodium chloride  3 mL Intravenous Q12H      PRN Medications:  sodium chloride, acetaminophen, nitroGLYCERIN, ondansetron (ZOFRAN) IV, sodium chloride   Assessment and Plan:  81 y/o F w/ PMHx of HTN, GERD, CKD, CAD w/ ICM, and BPH, presented from caridology clinic after an episode of syncope.   Syncope: Patient became unresponsive during patient interview in cardiology clinic. Became responsive and developed diaphoresis, nausea, and vomiting. Suspect vasovagal syncope. Troponins negative, EKG w/ no ischemic changes or apparent AV block. Does show multiple PVC's. Some question of ventricular arrhythmia given h/o ICM. Telemetry reveals frequent PVC's and bradycardia as low as 45. BP stable. May also be related to low blood volume given increased Cr form baseline and relative hemoconcentration.  -Check orthostatics this AM -Repeat ECHO today -Continue telemetry  Ischemic Cardiomyopathy: Most recent ECHO from 09/2012 showed EF 45-50%, mild LVH, diffuse hypokinesis, and grade 1 diastolic dysfunction. CXR on admission w/ no significant pulmonary edema.  -Repeat ECHO as above -Continue Coreg 3.125 mg bid -Continue Imdur 60 mg daily + Hydralazine 25 mg bid -Hold Lasix for now -ASA  Acute on CKD 4/5: Baseline Cr 2.0-2.2. 2.63 on admission, 2.74 this AM.  -Hold Lasix as above   Hyperkalemia: K borderline elevated this AM, 5.4.  -Repeat BMP in PM   Natasha Bence, MD PGY-2 Internal Medicine Pager: [phone]  Patient seen and  examined and history reviewed. Agree with above findings and plan. No recurrent syncope. Feels well. Does have some urinary complaints- UTI may have contributed to this episode yesterday. Will start antibiotics and send urine culture. His potassium and creatinine are increased from baseline. I suspect his is dry. Will hold diuretics and check orthostatic vitals. Echo pending today. Will transfer to floor.   Bradley [name], [place] 09/22/2014 9:04 AM

## 2014-09-22 NOTE — Care Management Note (Signed)
    Page 1 of 1   09/22/2014     9:17:31 AM CARE MANAGEMENT NOTE 09/22/2014  Patient:  Bradley [name], Bradley [name]   Account Number:  [id number]  Date Initiated:  09/22/2014  Documentation initiated by:  Elissa Hefty  Subjective/Objective Assessment:   adm w syncope     Action/Plan:   lives w wife, pcp dr Herbie [place] gates   Anticipated DC Date:     Anticipated DC Plan:  HOME/SELF CARE         Choice offered to / List presented to:             Status of service:   Medicare Important Message given?   (If response is "NO", the following Medicare IM given date fields will be blank) Date Medicare IM given:   Medicare IM given by:   Date Additional Medicare IM given:   Additional Medicare IM given by:    Discharge Disposition:    Per UR Regulation:  Reviewed for med. necessity/level of care/duration of stay  If discussed at [place] of Stay Meetings, dates discussed:    Comments:

## 2014-09-22 NOTE — Progress Notes (Signed)
Family at pts. Bedside expressing concerns about the cleanliness of the pts. Room and dirty linen that was left upon pts. Admission to the floor.

## 2014-09-23 ENCOUNTER — Encounter (HOSPITAL_COMMUNITY): Payer: Self-pay | Admitting: Physician Assistant

## 2014-09-23 ENCOUNTER — Telehealth: Payer: Self-pay | Admitting: Physician Assistant

## 2014-09-23 DIAGNOSIS — I255 Ischemic cardiomyopathy: Secondary | ICD-10-CM

## 2014-09-23 DIAGNOSIS — R55 Syncope and collapse: Secondary | ICD-10-CM

## 2014-09-23 DIAGNOSIS — I509 Heart failure, unspecified: Secondary | ICD-10-CM

## 2014-09-23 LAB — BASIC METABOLIC PANEL
ANION GAP: 10 (ref 5–15)
BUN: 49 mg/dL — ABNORMAL HIGH (ref 6–23)
CO2: 24 mmol/L (ref 19–32)
CREATININE: 2.7 mg/dL — AB (ref 0.50–1.35)
Calcium: 8.6 mg/dL (ref 8.4–10.5)
Chloride: 103 mmol/L (ref 96–112)
GFR calc non Af Amer: 20 mL/min — ABNORMAL LOW (ref >90)
GFR, EST AFRICAN AMERICAN: 23 mL/min — AB (ref >90)
Glucose, Bld: 87 mg/dL (ref 70–99)
POTASSIUM: 5.1 mmol/L (ref 3.5–5.1)
Sodium: 137 mmol/L (ref 135–145)

## 2014-09-23 LAB — CBC
HCT: 35.3 % — ABNORMAL LOW (ref 39.0–52.0)
Hemoglobin: 11.1 g/dL — ABNORMAL LOW (ref 13.0–17.0)
MCH: 27.2 pg (ref 26.0–34.0)
MCHC: 31.4 g/dL (ref 30.0–36.0)
MCV: 86.5 fL (ref 78.0–100.0)
Platelets: 171 10*3/uL (ref 150–400)
RBC: 4.08 MIL/uL — ABNORMAL LOW (ref 4.22–5.81)
RDW: 12.6 % (ref 11.5–15.5)
WBC: 4.7 10*3/uL (ref 4.0–10.5)

## 2014-09-23 MED ORDER — CEFUROXIME AXETIL 250 MG PO TABS: 250.0000 mg | ORAL_TABLET | Freq: Two times a day (BID) | ORAL | Status: DC

## 2014-09-23 NOTE — Discharge Summary (Signed)
Discharge Summary   Patient ID: Bradley [name] MRN: [id number], DOB/AGE: [birth year] 81 y.o. Admit date: 09/21/2014 D/C date:     09/23/2014  Primary Cardiologist: Dr. Tamala Julian  Principal Problem:   Syncope Active Problems:   Bacteriuria with pyuria   BPH (benign prostatic hyperplasia)   Essential hypertension   GERD (gastroesophageal reflux disease)   Chronic kidney disease, stage IV (severe)   Chronic systolic HF (heart failure)   CAD in native artery   Cardiomyopathy, ischemic   Admission Dates: 09/21/14-09/23/14 Discharge Diagnosis: Syncope- likely vasovagal.   HPI: Bradley [name] is a 81 y.o. male with a history of HTN, GERD, CKD, CAD w/ ICM, and BPH who was admitted to [facility] on 09/21/14 from caridology clinic after an episode of syncope.   He presented to see Dr. Tamala Julian for follow-up of newly diagnosed ischemic cardiomyopathy. Had high risk nuclear within past 4 weeks but was reluctant to consider cath due to concern about kidney function and age. Nuclear does not reveal ischemia but has EF 23%. We were going to discuss further at the office visit but the syncopal episode occurred before. Early in the interview the patient became unresponsive and began twitching while sitting in his chair. Dr  Caul nurse and he quickly got the patient to the floor without trauma to his head or body. Upon getting on the floor, after proximally 10 seconds, he became progressively responsive. His skin was very diaphoretic. He did not complain of chest pain or dyspnea prior or after the episode. He has a prior history of syncope. Upon awakening he had significant nausea and vomiting. EMS was summoned and he was sent to the emergency room.     Hospital Course  Syncope: Patient became unresponsive during patient interview in cardiology clinic. Became responsive and developed diaphoresis, nausea, and vomiting. Suspect vasovagal syncope.  -- Troponins negative, EKG w/ no ischemic changes or apparent AV  block. Does show multiple PVC's. Some question of ventricular arrhythmia given h/o ICM. Telemetry reveals frequent PVC's and bradycardia as low as 45. BP stable. May also be related to low blood volume given increased Cr form baseline and relative hemoconcentration.  -- Repeat ECHO done today. Not read yet.  Ischemic Cardiomyopathy: felt to be ischemic with high risk nuclear stress test: EF 23%. Reluctant to do cath due to CKD -- Most recent ECHO from 09/2012 showed EF 45-50%, mild LVH, diffuse hypokinesis, and grade 1 diastolic dysfunction. CXR on admission w/ no significant pulmonary edema.  -- Repeat ECHO as above -- Continue Coreg 3.125 mg bid. No ACE/ARB due to CKD. Continue Imdur 60 mg daily + Hydralazine 25 mg bid  Acute on CKD 4/5: Baseline Cr 2.0-2.2. 2.63 on admission, 2.70 this AM.  -- Resumed on home dose of lasix. BMET at follow up next Tuesday in the office.   Hyperkalemia: K borderline elevated at 5.4 on admission. Today 5.1.  -- Repeat BMP in office next week.   UTI- Does have some urinary complaints- UTI may have contributed to syncopal episode. Started on Rocephin and urine cultures pending.  -- I spoke with pharmacy who recommended Ceftin 250mg  BID for 7 days.   The patient has had an uncomplicated hospital course and is recovering well. He has been seen by Dr. Tamala Julian today and deemed ready for discharge home. All follow-up appointments have been scheduled. Discharge medications are listed below.   Discharge Vitals: Blood pressure 147/68, pulse 58, temperature 98.2 F (36.8 C), temperature source Oral, resp. rate  16, height 6' (1.829 m), weight 191 lb 1.6 oz (86.682 kg), SpO2 100 %.  Physical Exam: General: Elderly AA male, alert, cooperative, NAD. HEENT: PERRL, EOMI. Moist mucus membranes Neck: Full range of motion without pain, supple, no lymphadenopathy or carotid bruits Lungs: Clear to ascultation bilaterally, normal work of respiration, no wheezes, rales,  rhonchi Heart: RRR, no murmurs, gallops, or rubs Abdomen: Soft, non-tender, non-distended, BS + Extremities: No cyanosis, clubbing, or edema Neurologic: Alert & oriented x3, cranial nerves II-XII intact, strength grossly intact, sensation intact to light touch   Labs: Lab Results  Component Value Date   WBC 4.7 09/23/2014   HGB 11.1* 09/23/2014   HCT 35.3* 09/23/2014   MCV 86.5 09/23/2014   PLT 171 09/23/2014    Recent Labs Lab 09/21/14 1200  09/23/14 0343  NA 139  < > 137  K 5.1  < > 5.1  CL 106  < > 103  CO2 27  < > 24  BUN 42*  < > 49*  CREATININE 2.63*  < > 2.70*  CALCIUM 9.3  < > 8.6  PROT 6.8  --   --   BILITOT 0.9  --   --   ALKPHOS 72  --   --   ALT 35  --   --   AST 30  --   --   GLUCOSE 84  < > 87  < > = values in this interval not displayed.  Recent Labs  09/21/14 1256 09/21/14 2037 09/22/14 0041  TROPONINI <0.03 <0.03 <0.03     Diagnostic Studies/Procedures   Ct Head Wo Contrast  09/21/2014   CLINICAL DATA:  Left upper extremity weakness of uncertain time span ; syncope  EXAM: CT HEAD WITHOUT CONTRAST  TECHNIQUE: Contiguous axial images were obtained from the base of the skull through the vertex without intravenous contrast.  COMPARISON:  Head CT October 15, 2012 skull brain MRI October 15, 2012  FINDINGS: There is mild diffuse atrophy. There is no intracranial mass, hemorrhage, extra-axial fluid collection, or midline shift. There is patchy small vessel disease in the centra semiovale bilaterally, stable. No new gray-white compartment lesions. No acute infarct apparent. The bony calvarium appears intact. The mastoid air cells are clear. There is debris in each external auditory canal.  IMPRESSION: Atrophy with patchy periventricular small vessel disease. No intracranial mass, hemorrhage, or acute appearing infarct. Probable cerumen in each external auditory canal.   Electronically Signed   By: Lowella Grip III M.D.   On: 09/21/2014 12:25   Dg Chest Port  1 View  09/21/2014   CLINICAL DATA:  Syncope and nausea.  EXAM: PORTABLE CHEST - 1 VIEW  COMPARISON:  10/15/2012  FINDINGS: The heart size is at the upper limits of normal. Pacing pads are present. There is stable elevation of the anterior right hemidiaphragm with basilar atelectasis. There is no evidence of pulmonary edema, consolidation, pneumothorax, nodule or pleural fluid. Visualized bony structures are unremarkable.  IMPRESSION: Top-normal heart size. Right basilar atelectasis with stable elevation of the anterior right hemidiaphragm.   Electronically Signed   By: Aletta Edouard M.D.   On: 09/21/2014 13:20    Discharge Medications     Medication List    TAKE these medications        aspirin EC 81 MG tablet  Take 1 tablet (81 mg total) by mouth daily.     CALCIUM + D PO  Take 600 mg by mouth 2 (two) times daily.  carvedilol 3.125 MG tablet  Commonly known as:  COREG  Take 1 tablet (3.125 mg total) by mouth 2 (two) times daily.     cefUROXime 250 MG tablet  Commonly known as:  CEFTIN  Take 1 tablet (250 mg total) by mouth 2 (two) times daily with a meal.     cholecalciferol 1000 UNITS tablet  Commonly known as:  VITAMIN D  Take 1,000 Units by mouth 2 (two) times daily.     dorzolamide-timolol 22.3-6.8 MG/ML ophthalmic solution  Commonly known as:  COSOPT  1 drop 2 (two) times daily.     DSS 100 MG Caps  Take 100 mg by mouth 2 (two) times daily.     furosemide 40 MG tablet  Commonly known as:  LASIX  Take 1 tablet (40 mg total) by mouth daily.     hydrALAZINE 25 MG tablet  Commonly known as:  APRESOLINE  Take 1 tablet (25 mg total) by mouth 2 (two) times daily.     isosorbide mononitrate 60 MG 24 hr tablet  Commonly known as:  IMDUR  Take 1 tablet (60 mg total) by mouth daily.     latanoprost 0.005 % ophthalmic solution  Commonly known as:  XALATAN  Place 1 drop into both eyes at bedtime.     nitroGLYCERIN 0.4 MG SL tablet  Commonly known as:  NITROSTAT   Place 1 tablet (0.4 mg total) under the tongue every 5 (five) minutes as needed.     omeprazole 20 MG capsule  Commonly known as:  PRILOSEC  Take 2 capsules (40 mg total) by mouth daily.        Disposition   The patient will be discharged in stable condition to home.  Follow-up Information    Follow up with Richardson Dopp, PA-C On 09/28/2014.   Specialty:  Physician Assistant   Why:  9:30am on a day that Dr. Tamala Julian is in the office.    Contact information:   [id number] [place] [address] [phone]         Duration of Discharge Encounter: Greater than 30 minutes including physician and PA time.  Mable Fill R PA-C 09/23/2014, 12:28 PM

## 2014-09-23 NOTE — Telephone Encounter (Signed)
New message      TCM appt on 09-28-14 per [email or web address]

## 2014-09-23 NOTE — Progress Notes (Signed)
  Echocardiogram 2D Echocardiogram has been performed.  Bradley [name] 09/23/2014, 10:04 AM

## 2014-09-23 NOTE — Progress Notes (Signed)
Physical Therapy Treatment Patient Details Name: Bradley [name] MRN: [id number] DOB: 07/24/1928 Today's Date: 09/23/2014    History of Present Illness 81 y.o.male with a significant PMH of hypertension, syncope and collapse, pneumonia, GERD, UTI, BPH, strabismus, arthritis presents to the ER by EMS from the cardiology Dr. Daneen Schick office after a syncopal episode.     PT Comments    Patient mobilizing [name] with PT, able to perform dressing tasks, hygiene, transfers from varying surfaces as [name] as ambulation with and without device. Patient required no physical assist to perform these tasks and minimal cues.  Patient is progressing [name], anticipate patient will be safe for dc home. Recommend initial PRN supervision post acute stay.   Follow Up Recommendations  Home health PT;Supervision - Intermittent     Equipment Recommendations  None recommended by PT    Recommendations for Other Services       Precautions / Restrictions Precautions Precautions: Fall Restrictions Weight Bearing Restrictions: No    Mobility  Bed Mobility Overal bed mobility: Modified Independent                Transfers Overall transfer level: Needs assistance Equipment used: None Transfers: Sit to/from Stand Sit to Stand: Supervision         General transfer comment: no physical assist required (from toilet and bed)  Ambulation/Gait Ambulation/Gait assistance: Supervision Ambulation Distance (Feet): 160 Feet Assistive device: None Gait Pattern/deviations: Step-through pattern;Decreased stride length;Antalgic;Trunk flexed Gait velocity: decreased Gait velocity interpretation: Below normal speed for age/gender General Gait Details: some instability noted, patient reports this at baseline, no physical assist, patient able to self correct   Stairs            Wheelchair Mobility    Modified Rankin (Stroke Patients Only)       Balance   Sitting-balance support: Feet  supported Sitting balance-Leahy Scale: Good     Standing balance support: No upper extremity supported Standing balance-Leahy Scale: Fair                      Cognition Arousal/Alertness: Awake/alert Behavior During Therapy: WFL for tasks assessed/performed Overall Cognitive Status: No family/caregiver present to determine baseline cognitive functioning                      Exercises      General Comments General comments (skin integrity, edema, etc.): patient educated on energy conservation and mobility expectations upon discharge.       Pertinent Vitals/Pain Pain Assessment: No/denies pain    Home Living                      Prior Function            PT Goals (current goals can now be found in the care plan section) Acute Rehab PT Goals Patient Stated Goal: to go home PT Goal Formulation: With patient Time For Goal Achievement: 10/06/14 Potential to Achieve Goals: Good Progress towards PT goals: Progressing toward goals    Frequency  Min 3X/week    PT Plan Current plan remains appropriate    Co-evaluation             End of Session Equipment Utilized During Treatment: Gait belt Activity Tolerance: Patient tolerated treatment [name] Patient left: in chair;with call bell/phone within reach     Time: 1046-1110 PT Time Calculation (min) (ACUTE ONLY): 24 min  Charges:  $Gait Training: 8-22 mins $Self Care/Home Management: 8-22  G CodesDuncan Dull 2014-09-26, 1:53 PM Alben Deeds, [place] DPT  [phone]

## 2014-09-24 NOTE — Telephone Encounter (Signed)
Patient contacted regarding discharge from [facility] on September 23, 2014.  Patient understands to follow up with provider Richardson Dopp, PA-C on September 28, 2014 at 9:30AM at [facility]. Patient understands discharge instructions? yes Patient understands medications and regiment? yes Patient understands to bring all medications to this visit? yes

## 2014-09-26 LAB — URINE CULTURE

## 2014-09-28 ENCOUNTER — Ambulatory Visit (INDEPENDENT_AMBULATORY_CARE_PROVIDER_SITE_OTHER): Payer: Medicare Other | Admitting: Physician Assistant

## 2014-09-28 ENCOUNTER — Encounter: Payer: Self-pay | Admitting: Physician Assistant

## 2014-09-28 VITALS — BP 145/80 | HR 61 | Ht 72.0 in | Wt 200.0 lb

## 2014-09-28 DIAGNOSIS — I2510 Atherosclerotic heart disease of native coronary artery without angina pectoris: Secondary | ICD-10-CM

## 2014-09-28 DIAGNOSIS — I10 Essential (primary) hypertension: Secondary | ICD-10-CM

## 2014-09-28 DIAGNOSIS — I5022 Chronic systolic (congestive) heart failure: Secondary | ICD-10-CM | POA: Diagnosis not present

## 2014-09-28 DIAGNOSIS — R55 Syncope and collapse: Secondary | ICD-10-CM

## 2014-09-28 DIAGNOSIS — N390 Urinary tract infection, site not specified: Secondary | ICD-10-CM

## 2014-09-28 DIAGNOSIS — N184 Chronic kidney disease, stage 4 (severe): Secondary | ICD-10-CM

## 2014-09-28 DIAGNOSIS — I255 Ischemic cardiomyopathy: Secondary | ICD-10-CM | POA: Diagnosis not present

## 2014-09-28 DIAGNOSIS — I25119 Atherosclerotic heart disease of native coronary artery with unspecified angina pectoris: Secondary | ICD-10-CM

## 2014-09-28 LAB — BASIC METABOLIC PANEL
BUN: 37 mg/dL — ABNORMAL HIGH (ref 6–23)
CALCIUM: 9.4 mg/dL (ref 8.4–10.5)
CHLORIDE: 106 meq/L (ref 96–112)
CO2: 26 mEq/L (ref 19–32)
Creatinine, Ser: 2.4 mg/dL — ABNORMAL HIGH (ref 0.40–1.50)
GFR: 33.3 mL/min — AB (ref >60.00)
Glucose, Bld: 94 mg/dL (ref 70–99)
POTASSIUM: 5.4 meq/L — AB (ref 3.5–5.1)
Sodium: 137 mEq/L (ref 135–145)

## 2014-09-28 MED ORDER — ISOSORBIDE MONONITRATE ER 60 MG PO TB24
ORAL_TABLET | ORAL | Status: DC
Start: 2014-09-28 — End: 2015-06-16

## 2014-09-28 NOTE — Progress Notes (Signed)
Cardiology Office Note   Date:  09/28/2014   ID:  Bradley [name], DOB [birth year], MRN [id number]  Patient Care Team: [name], MD as PCP - General (Internal Medicine) Sinclair Grooms, MD as Consulting Physician (Cardiology) Arvil Persons, MD as Consulting Physician (Urology)   Chief Complaint  Patient presents with  . Loss of Consciousness  . Hospitalization Follow-up     History of Present Illness: Bradley [name] is a 81 y.o. male with a hx of probable ischemic cardiomyopathy, CAD, systolic CHF, HTN, CKD.  Patient was recently noted to have worsening LV function and evidence of inferior scar on Myoview. Given his chronic kidney disease, he is not a candidate for cardiac catheterization.  Admitted 2/23-2/25 for syncope. He was admitted from the office. After presenting for routine follow-up, he had a syncopal episode.  UA was remarkable for Escherichia coli UTI. Telemetry demonstrated PVCs and bradycardia.  Cardiac enzymes remained normal. Echocardiogram confirmed EF 30-35% with inferior wall motion abnormalities. He had an increase in his creatinine from baseline (2 >> 2.7).  He returns for FU.  He is here today with his daughter. Since discharge, he denies any further dizziness or syncope. He usually has some chest tightness with exertion. He had some increased symptoms last night with getting up to go to the bathroom. He used nitroglycerin with relief. He has chronic dyspnea with exertion. He is NYHA 2b-3. He denies orthopnea, PND. He has occasional pedal edema without significant change.    Studies/Reports Reviewed Today:  Echocardiogram 09/23/14 Moderate LVH, EF 30-35%, akinesis of mid apical/inferior and inferoseptal myocardium, grade 1 diastolic dysfunction Mild AI Mild LAE Mild RVE PASP 34 mmHg  Myoview 08/30/14 High risk Large scar in the entire inferior, basal and mid inferolateral wall with no ischemia. EF: 23%. LV Wall Motion: HK and akinesis of the entire  inferior and inf-lat walls.   Carotid US 10/17/12 Left - 40% to 59% proximal ICA    Past Medical History  Diagnosis Date  . Hypertension   . Cataract   . Syncope and collapse     a. in 2014 and again in 08/2014. Thought to be vasovagal   . GERD (gastroesophageal reflux disease)   . Strabismus     a. right eye  . BPH (benign prostatic hypertrophy)   . PPD positive     a. HX POSITIVE PPD WITH NEGATIVE CXR  . Varicosities of leg   . Chronic kidney disease   . Dysrhythmia   . Arthritis   . Shortness of breath dyspnea   . Cardiomyopathy     a. thought to be ischemic with high risk MPS EF 23%. Reluctant to do cath due to CKD    Past Surgical History  Procedure Laterality Date  . Transurethral resection of prostate  2006  . Cataract extraction w/ intraocular lens implant      BOTH EYES  . Tonsillectomy      "I was young" (10/15/2012)  . Total knee arthroplasty Left 12/01/2012    Procedure: LEFT TOTAL KNEE ARTHROPLASTY;  Surgeon: [name], MD;  Location: [facility];  Service: Orthopedics;  Laterality: Left;     Current Outpatient Prescriptions  Medication Sig Dispense Refill  . aspirin EC 81 MG tablet Take 1 tablet (81 mg total) by mouth daily.    . Calcium Carbonate-Vitamin D (CALCIUM + D PO) Take 600 mg by mouth 2 (two) times daily.    . carvedilol (COREG) 3.125 MG tablet Take  1 tablet (3.125 mg total) by mouth 2 (two) times daily. 60 tablet 3  . cefUROXime (CEFTIN) 250 MG tablet Take 1 tablet (250 mg total) by mouth 2 (two) times daily with a meal. 14 tablet 0  . cholecalciferol (VITAMIN D) 1000 UNITS tablet Take 1,000 Units by mouth 2 (two) times daily.    [name] Sodium (DSS) 100 MG CAPS Take 100 mg by mouth 2 (two) times daily.    . dorzolamide-timolol (COSOPT) 22.3-6.8 MG/ML ophthalmic solution 1 drop 2 (two) times daily.     . furosemide (LASIX) 40 MG tablet Take 1 tablet (40 mg total) by mouth daily. 30 tablet 11  . hydrALAZINE (APRESOLINE) 25 MG tablet Take 1  tablet (25 mg total) by mouth 2 (two) times daily. 60 tablet 11  . isosorbide mononitrate (IMDUR) 60 MG 24 hr tablet Take 1 tablet (60 mg total) by mouth daily. 30 tablet 3  . latanoprost (XALATAN) 0.005 % ophthalmic solution Place 1 drop into both eyes at bedtime.    . nitroGLYCERIN (NITROSTAT) 0.4 MG SL tablet Place 1 tablet (0.4 mg total) under the tongue every 5 (five) minutes as needed. 25 tablet 3  . omeprazole (PRILOSEC) 20 MG capsule Take 2 capsules (40 mg total) by mouth daily.     No current facility-administered medications for this visit.    Allergies:   Review of patient's allergies indicates no known allergies.    Social History:  The patient  reports that he has never smoked. He has never used smokeless tobacco. He reports that he does not drink alcohol or use illicit drugs.   Family History:  The patient's family history includes Heart failure in his mother; Hypertension in his brother and mother; Pneumonia in his father. There is no history of Heart attack or Stroke.    ROS:   Please see the history of present illness.   Review of Systems  Cardiovascular: Positive for chest pain, leg swelling and syncope.  Respiratory: Positive for cough and shortness of breath.   Gastrointestinal: Positive for constipation.  Genitourinary:       Difficulty urinating  Neurological: Positive for dizziness and loss of balance.  All other systems reviewed and are negative.    PHYSICAL EXAM: VS:  BP 145/80 mmHg  Pulse 61  Ht 6' (1.829 m)  Wt 200 lb (90.719 kg)  BMI 27.12 kg/m2    Wt Readings from Last 3 Encounters:  09/28/14 200 lb (90.719 kg)  09/23/14 191 lb 1.6 oz (86.682 kg)  09/21/14 189 lb 9.6 oz (86.002 kg)     GEN: Well nourished, well developed, in no acute distress HEENT: normal Neck: no JVD, no masses Cardiac:  Normal S1/S2, RRR; no murmur, no rubs or gallops, no edema  Respiratory:  clear to auscultation bilaterally, no wheezing, rhonchi or rales. GI: soft,  nontender, nondistended, + BS MS: no deformity or atrophy Skin: warm and dry  Neuro:  CNs II-XII intact, Strength and sensation are intact Psych: Normal affect   EKG:  EKG is ordered today.  It demonstrates:   NSR, HR 61, rightward axis, borderline LVH, inferolateral T-wave inversions   Recent Labs: 09/21/2014: ALT 35; B Natriuretic Peptide 1038.6*; Magnesium 2.0; TSH 1.516 09/23/2014: BUN 49*; Creatinine 2.70*; Hemoglobin 11.1*; Platelets 171; Potassium 5.1; Sodium 137    Lipid Panel    Component Value Date/Time   CHOL 162 10/16/2012 0510   TRIG 110 10/16/2012 0510   HDL 39* 10/16/2012 0510   CHOLHDL 4.2 10/16/2012 0510  VLDL 22 10/16/2012 0510   LDLCALC 101* 10/16/2012 0510      ASSESSMENT AND PLAN:  1.  Syncope:  This was likely vaso-vagal syncope in the setting of UTI.  No further recurrence. 2.  Ischemic Cardiomyopathy:  This is being managed conservatively. He is not a candidate for ACE inhibitor given chronic kidney disease. Continue beta blocker, hydralazine, nitrates. 3.  Chronic Systolic CHF:  Volume overall remains stable. Continue current dose of Lasix. Check follow-up basic metabolic panel today. 4.  CAD:  He has chronic angina. I will adjust his isosorbide to 60 mg in the morning and 30 mg in the evening to improve anginal control. Continue current dose of beta blocker. Bradycardia precludes increasing the dose further. Continue aspirin. 5.  Chronic Kidney Disease:  Obtain follow-up basic metabolic panel today. 6.  Hyperkalemia:  Obtain follow-up basic metabolic panel today. 7.  UTI:  He is completing his course of Ceftin.  He sees his urologist in a couple weeks.     Current medicines are reviewed at length with the patient today.  The patient does not have concerns regarding medicines.  The following changes have been made:  As above  Labs/ tests ordered today include:  Orders Placed This Encounter  Procedures  . Basic Metabolic Panel (BMET)  . EKG 12-Lead       Disposition:   FU with Dr. Tamala Julian in 4-6 weeks  Signed, Versie Starks, MHS 09/28/2014 9:59 AM    [facility] [place], [place], [place]  [address] Phone: [phone]; Fax: [phone]

## 2014-09-28 NOTE — Patient Instructions (Addendum)
LAB WORK TODAY; BMET  FOLLOW UP WITH DR. Tamala Julian IN 4-6 WEEKS  INCREASE IMDUR TO 60 MG IN THE MORNING AND 30 MG IN THE PM  VASOVAGAL SYNCOPE: A sudden drop in heart rate and blood pressure leading to fainting, often in reaction to a stressful trigger.

## 2014-09-29 ENCOUNTER — Telehealth: Payer: Self-pay | Admitting: Physician Assistant

## 2014-09-29 DIAGNOSIS — I5022 Chronic systolic (congestive) heart failure: Secondary | ICD-10-CM

## 2014-09-29 NOTE — Telephone Encounter (Signed)
New problem    Bradley [name] is on her lunch break, returning your call and waiting on your call back. Please call pt.

## 2014-09-30 NOTE — Telephone Encounter (Signed)
s/w pt's daughter [name] about lab results. I advised pt needs to limit dietary K+. She said he eats a banana everymorning with his cereal. I asked for her to please try to cut back to every other day. Daughter verbalized understanding to Plan of care.

## 2014-10-01 ENCOUNTER — Telehealth: Payer: Self-pay | Admitting: Interventional Cardiology

## 2014-10-01 NOTE — Telephone Encounter (Signed)
Fax (echo results)placed in nurse fax box in MR to fax to [facility] [phone]

## 2014-10-01 NOTE — Telephone Encounter (Signed)
New message     Need to confirm diagnosis of CHF and need last ejection fraction and date.  Please fax to [phone]

## 2014-10-03 ENCOUNTER — Other Ambulatory Visit: Payer: Self-pay | Admitting: Physician Assistant

## 2014-10-12 ENCOUNTER — Other Ambulatory Visit (INDEPENDENT_AMBULATORY_CARE_PROVIDER_SITE_OTHER): Payer: Medicare Other | Admitting: *Deleted

## 2014-10-12 DIAGNOSIS — I5022 Chronic systolic (congestive) heart failure: Secondary | ICD-10-CM | POA: Diagnosis not present

## 2014-10-12 LAB — BASIC METABOLIC PANEL
BUN: 39 mg/dL — AB (ref 6–23)
CALCIUM: 9.1 mg/dL (ref 8.4–10.5)
CO2: 28 mEq/L (ref 19–32)
CREATININE: 2.38 mg/dL — AB (ref 0.40–1.50)
Chloride: 104 mEq/L (ref 96–112)
GFR: 33.62 mL/min — ABNORMAL LOW (ref >60.00)
Glucose, Bld: 86 mg/dL (ref 70–99)
Potassium: 4.5 mEq/L (ref 3.5–5.1)
Sodium: 136 mEq/L (ref 135–145)

## 2014-10-21 ENCOUNTER — Telehealth: Payer: Self-pay

## 2014-10-21 NOTE — Telephone Encounter (Signed)
called pt daughter to schedule pt f/u appt with Dr.Smith for the end of March early April.[email or web address]

## 2014-11-09 ENCOUNTER — Encounter: Payer: Self-pay | Admitting: Interventional Cardiology

## 2014-11-09 ENCOUNTER — Ambulatory Visit (INDEPENDENT_AMBULATORY_CARE_PROVIDER_SITE_OTHER): Payer: Medicare Other | Admitting: Interventional Cardiology

## 2014-11-09 VITALS — BP 120/80 | HR 60 | Ht 73.0 in | Wt 196.4 lb

## 2014-11-09 DIAGNOSIS — I2510 Atherosclerotic heart disease of native coronary artery without angina pectoris: Secondary | ICD-10-CM

## 2014-11-09 DIAGNOSIS — I10 Essential (primary) hypertension: Secondary | ICD-10-CM | POA: Diagnosis not present

## 2014-11-09 DIAGNOSIS — I255 Ischemic cardiomyopathy: Secondary | ICD-10-CM

## 2014-11-09 DIAGNOSIS — I5022 Chronic systolic (congestive) heart failure: Secondary | ICD-10-CM

## 2014-11-09 DIAGNOSIS — R55 Syncope and collapse: Secondary | ICD-10-CM

## 2014-11-09 MED ORDER — CARVEDILOL 3.125 MG PO TABS: ORAL_TABLET | ORAL | Status: DC

## 2014-11-09 NOTE — Patient Instructions (Addendum)
Your physician has recommended you make the following change in your medication:  1) INCREASE Carvedilol to 1 1/2 tablet Twice daily. An Rx has been sent to your pharmacy  Your physician recommends that you return for lab work in: 3 months (Bmet)  Your physician recommends that you schedule a follow-up appointment in: 3 months

## 2014-11-09 NOTE — Progress Notes (Signed)
Cardiology Office Note   Date:  11/09/2014   ID:  Bradley [name], DOB 1927/10/05, MRN [id number]  PCP:  Henrine Screws, MD  Cardiologist:   Sinclair Grooms, MD   No chief complaint on file.     History of Present Illness: Bradley [name] is a 81 y.o. male who presents for systolic heart failure, coronary artery disease, chronic kidney disease, hypertension, and history of recurrent neurally mediated syncope.  The last office visit was associated with syncope and required hospitalization. It was felt to be vasovagal. We have discussed the aggressiveness of management of his coronary disease. We decided against coronary angiography because of renal impairment and his age. Denies angina. He does have limiting exertional dyspnea. He is on heart failure therapy and doses are being titrated to improve symptoms.  He has had one additional episode of syncope associated with nausea and vomiting one week ago. He does not drive.  We are marked upon a considerable conversation concerning end-of-life discussions. The daughter was present. They had apparently covered this discussion previously at home. He has been unable to provide any definite guidance other than to say "when the [facility] is ready for him he will go". He is frightened about addressing the no code discussion.    Past Medical History  Diagnosis Date  . Hypertension   . Cataract   . Syncope and collapse     a. in 2014 and again in 08/2014. Thought to be vasovagal   . GERD (gastroesophageal reflux disease)   . Strabismus     a. right eye  . BPH (benign prostatic hypertrophy)   . PPD positive     a. HX POSITIVE PPD WITH NEGATIVE CXR  . Varicosities of leg   . Chronic kidney disease   . Dysrhythmia   . Arthritis   . Shortness of breath dyspnea   . Cardiomyopathy     a. thought to be ischemic with high risk MPS EF 23%. Reluctant to do cath due to CKD    Past Surgical History  Procedure Laterality Date  .  Transurethral resection of prostate  2006  . Cataract extraction w/ intraocular lens implant      BOTH EYES  . Tonsillectomy      "I was young" (10/15/2012)  . Total knee arthroplasty Left 12/01/2012    Procedure: LEFT TOTAL KNEE ARTHROPLASTY;  Surgeon: [name], MD;  Location: [facility];  Service: Orthopedics;  Laterality: Left;     Current Outpatient Prescriptions  Medication Sig Dispense Refill  . aspirin EC 81 MG tablet Take 1 tablet (81 mg total) by mouth daily.    . Calcium Carbonate-Vitamin D (CALCIUM + D PO) Take 600 mg by mouth 2 (two) times daily.    . carvedilol (COREG) 3.125 MG tablet Take 1 tablet (3.125 mg total) by mouth 2 (two) times daily. 60 tablet 3  . cefUROXime (CEFTIN) 250 MG tablet Take 1 tablet (250 mg total) by mouth 2 (two) times daily with a meal. 14 tablet 0  . cholecalciferol (VITAMIN D) 1000 UNITS tablet Take 1,000 Units by mouth 2 (two) times daily.    [name] Sodium (DSS) 100 MG CAPS Take 100 mg by mouth 2 (two) times daily.    . dorzolamide-timolol (COSOPT) 22.3-6.8 MG/ML ophthalmic solution 1 drop 2 (two) times daily.     . furosemide (LASIX) 40 MG tablet Take 1 tablet (40 mg total) by mouth daily. 30 tablet 11  . hydrALAZINE (APRESOLINE) 25  MG tablet Take 1 tablet (25 mg total) by mouth 2 (two) times daily. 60 tablet 11  . isosorbide mononitrate (IMDUR) 60 MG 24 hr tablet Take 60 mg in the AM and 30 mg in the PM 90 tablet 3  . latanoprost (XALATAN) 0.005 % ophthalmic solution Place 1 drop into both eyes at bedtime.    . nitroGLYCERIN (NITROSTAT) 0.4 MG SL tablet Place 1 tablet (0.4 mg total) under the tongue every 5 (five) minutes as needed. 25 tablet 3  . omeprazole (PRILOSEC) 20 MG capsule Take 2 capsules (40 mg total) by mouth daily.     No current facility-administered medications for this visit.    Allergies:   Review of patient's allergies indicates no known allergies.    Social History:  The patient  reports that he has never smoked. He  has never used smokeless tobacco. He reports that he does not drink alcohol or use illicit drugs.   Family History:  The patient's family history includes Heart failure in his mother; Hypertension in his brother and mother; Pneumonia in his father. There is no history of Heart attack or Stroke.    ROS:  Please see the history of present illness.   Otherwise, review of systems are positive for dyspnea and weakness..   All other systems are reviewed and negative.    PHYSICAL EXAM: VS:  BP 120/80 mmHg  Pulse 60  Ht 6\' 1"  (1.854 m)  Wt 196 lb 6.4 oz (89.086 kg)  BMI 25.92 kg/m2 , BMI Body mass index is 25.92 kg/(m^2). GEN: Well nourished, well developed, in no acute distress HEENT: normal Neck: no JVD, carotid bruits, or masses Cardiac: RRR; no murmurs, rubs, or gallops,no edema  Respiratory:  clear to auscultation bilaterally, normal work of breathing GI: soft, nontender, nondistended, + BS MS: no deformity or atrophy Skin: warm and dry, no rash Neuro:  Strength and sensation are intact Psych: euthymic mood, full affect   EKG:  EKG is not ordered today.    Recent Labs: 09/21/2014: ALT 35; B Natriuretic Peptide 1038.6*; Magnesium 2.0; TSH 1.516 09/23/2014: Hemoglobin 11.1*; Platelets 171 10/12/2014: BUN 39*; Creatinine 2.38*; Potassium 4.5; Sodium 136    Lipid Panel    Component Value Date/Time   CHOL 162 10/16/2012 0510   TRIG 110 10/16/2012 0510   HDL 39* 10/16/2012 0510   CHOLHDL 4.2 10/16/2012 0510   VLDL 22 10/16/2012 0510   LDLCALC 101* 10/16/2012 0510      Wt Readings from Last 3 Encounters:  11/09/14 196 lb 6.4 oz (89.086 kg)  09/28/14 200 lb (90.719 kg)  09/23/14 191 lb 1.6 oz (86.682 kg)      Other studies Reviewed: Additional studies/ records that were reviewed today include: Reviewed most recent laboratory and clinical data.    ASSESSMENT AND PLAN:  Chronic systolic HF (heart failure): Presumed related to coronary artery disease with an impact from  poorly controlled hypertension over the years.  Cardiomyopathy, ischemic: Presumed related to the patient's known abnormal myocardial perfusion study which was high risk  CAD in native artery: Presumed related to the patient's abnormal/high risk myocardial perfusion study  Essential hypertension: Controlled  Vasovagal syncope: Recurrent syncope     Current medicines are reviewed at length with the patient today.  The patient has concerns regarding medicines.  The following changes have been made: Now understands why we use current medications Discussed end of life and code status with no resolution.  Labs/ tests ordered today include: Needs BMET on return  No orders of the defined types were placed in this encounter.     Disposition:   FU with . Rachel Samples in 3 months   Signed, Sinclair Grooms, MD  11/09/2014 9:35 AM    [facility] [place], [place], [place]  [address] Phone: [phone]; Fax: [phone]

## 2014-11-15 ENCOUNTER — Other Ambulatory Visit: Payer: Medicare Other

## 2014-11-17 ENCOUNTER — Ambulatory Visit: Payer: Medicare Other | Admitting: Interventional Cardiology

## 2014-12-25 ENCOUNTER — Other Ambulatory Visit: Payer: Self-pay | Admitting: Interventional Cardiology

## 2014-12-28 ENCOUNTER — Telehealth: Payer: Self-pay | Admitting: Interventional Cardiology

## 2014-12-28 NOTE — Telephone Encounter (Signed)
New Message  Bradley [name] from [facility] callnig about echo results that she has not received. Fax number [phone]. Please call back and discuss.

## 2015-01-18 NOTE — Telephone Encounter (Signed)
F/u    Still haven't received the information that was requested about dx and EF.

## 2015-01-18 NOTE — Telephone Encounter (Signed)
Will send to medical records  

## 2015-02-02 ENCOUNTER — Other Ambulatory Visit (INDEPENDENT_AMBULATORY_CARE_PROVIDER_SITE_OTHER): Payer: Medicare Other | Admitting: *Deleted

## 2015-02-02 DIAGNOSIS — I10 Essential (primary) hypertension: Secondary | ICD-10-CM

## 2015-02-02 DIAGNOSIS — I5022 Chronic systolic (congestive) heart failure: Secondary | ICD-10-CM | POA: Diagnosis not present

## 2015-02-02 LAB — BASIC METABOLIC PANEL
BUN: 47 mg/dL — ABNORMAL HIGH (ref 6–23)
CALCIUM: 8.9 mg/dL (ref 8.4–10.5)
CO2: 25 mEq/L (ref 19–32)
Chloride: 107 mEq/L (ref 96–112)
Creatinine, Ser: 2.46 mg/dL — ABNORMAL HIGH (ref 0.40–1.50)
GFR: 32.34 mL/min — AB (ref >60.00)
Glucose, Bld: 93 mg/dL (ref 70–99)
POTASSIUM: 4.6 meq/L (ref 3.5–5.1)
SODIUM: 139 meq/L (ref 135–145)

## 2015-04-28 ENCOUNTER — Other Ambulatory Visit: Payer: Self-pay | Admitting: Interventional Cardiology

## 2015-05-12 ENCOUNTER — Inpatient Hospital Stay (HOSPITAL_COMMUNITY)
Admission: EM | Admit: 2015-05-12 | Discharge: 2015-05-14 | DRG: 242 | Disposition: A | Discharge status: Home / Self Care | Payer: Medicare Other | Attending: Cardiology | Admitting: Cardiology

## 2015-05-12 ENCOUNTER — Emergency Department (HOSPITAL_COMMUNITY): Payer: Medicare Other

## 2015-05-12 ENCOUNTER — Encounter (HOSPITAL_COMMUNITY): Payer: Self-pay | Admitting: Emergency Medicine

## 2015-05-12 DIAGNOSIS — Z96652 Presence of left artificial knee joint: Secondary | ICD-10-CM | POA: Diagnosis present

## 2015-05-12 DIAGNOSIS — I10 Essential (primary) hypertension: Secondary | ICD-10-CM | POA: Diagnosis not present

## 2015-05-12 DIAGNOSIS — I495 Sick sinus syndrome: Secondary | ICD-10-CM | POA: Diagnosis present

## 2015-05-12 DIAGNOSIS — I469 Cardiac arrest, cause unspecified: Secondary | ICD-10-CM | POA: Diagnosis present

## 2015-05-12 DIAGNOSIS — Z79899 Other long term (current) drug therapy: Secondary | ICD-10-CM | POA: Diagnosis not present

## 2015-05-12 DIAGNOSIS — R001 Bradycardia, unspecified: Secondary | ICD-10-CM

## 2015-05-12 DIAGNOSIS — H509 Unspecified strabismus: Secondary | ICD-10-CM | POA: Diagnosis present

## 2015-05-12 DIAGNOSIS — N184 Chronic kidney disease, stage 4 (severe): Secondary | ICD-10-CM | POA: Diagnosis present

## 2015-05-12 DIAGNOSIS — N400 Enlarged prostate without lower urinary tract symptoms: Secondary | ICD-10-CM | POA: Diagnosis present

## 2015-05-12 DIAGNOSIS — I255 Ischemic cardiomyopathy: Secondary | ICD-10-CM | POA: Diagnosis present

## 2015-05-12 DIAGNOSIS — I129 Hypertensive chronic kidney disease with stage 1 through stage 4 chronic kidney disease, or unspecified chronic kidney disease: Secondary | ICD-10-CM | POA: Diagnosis not present

## 2015-05-12 DIAGNOSIS — R55 Syncope and collapse: Secondary | ICD-10-CM | POA: Diagnosis present

## 2015-05-12 DIAGNOSIS — R7611 Nonspecific reaction to tuberculin skin test without active tuberculosis: Secondary | ICD-10-CM | POA: Diagnosis not present

## 2015-05-12 DIAGNOSIS — K219 Gastro-esophageal reflux disease without esophagitis: Secondary | ICD-10-CM | POA: Diagnosis not present

## 2015-05-12 DIAGNOSIS — I5022 Chronic systolic (congestive) heart failure: Secondary | ICD-10-CM | POA: Diagnosis present

## 2015-05-12 DIAGNOSIS — Z7982 Long term (current) use of aspirin: Secondary | ICD-10-CM

## 2015-05-12 DIAGNOSIS — I462 Cardiac arrest due to underlying cardiac condition: Secondary | ICD-10-CM | POA: Diagnosis present

## 2015-05-12 DIAGNOSIS — Z950 Presence of cardiac pacemaker: Secondary | ICD-10-CM | POA: Diagnosis present

## 2015-05-12 DIAGNOSIS — Z8249 Family history of ischemic heart disease and other diseases of the circulatory system: Secondary | ICD-10-CM | POA: Diagnosis not present

## 2015-05-12 DIAGNOSIS — I5042 Chronic combined systolic (congestive) and diastolic (congestive) heart failure: Secondary | ICD-10-CM | POA: Diagnosis present

## 2015-05-12 DIAGNOSIS — I441 Atrioventricular block, second degree: Secondary | ICD-10-CM

## 2015-05-12 DIAGNOSIS — R072 Precordial pain: Secondary | ICD-10-CM

## 2015-05-12 DIAGNOSIS — N189 Chronic kidney disease, unspecified: Secondary | ICD-10-CM | POA: Diagnosis present

## 2015-05-12 HISTORY — DX: Presence of cardiac pacemaker: Z95.0

## 2015-05-12 HISTORY — DX: Bradycardia, unspecified: R00.1

## 2015-05-12 LAB — CBC WITH DIFFERENTIAL/PLATELET
BASOS PCT: 1 %
Basophils Absolute: 0 10*3/uL (ref 0.0–0.1)
EOS ABS: 0.3 10*3/uL (ref 0.0–0.7)
EOS PCT: 6 %
HCT: 35.7 % — ABNORMAL LOW (ref 39.0–52.0)
Hemoglobin: 11.6 g/dL — ABNORMAL LOW (ref 13.0–17.0)
LYMPHS ABS: 2.1 10*3/uL (ref 0.7–4.0)
Lymphocytes Relative: 37 %
MCH: 28.7 pg (ref 26.0–34.0)
MCHC: 32.5 g/dL (ref 30.0–36.0)
MCV: 88.4 fL (ref 78.0–100.0)
MONOS PCT: 10 %
Monocytes Absolute: 0.6 10*3/uL (ref 0.1–1.0)
Neutro Abs: 2.7 10*3/uL (ref 1.7–7.7)
Neutrophils Relative %: 46 %
PLATELETS: 178 10*3/uL (ref 150–400)
RBC: 4.04 MIL/uL — ABNORMAL LOW (ref 4.22–5.81)
RDW: 13.2 % (ref 11.5–15.5)
WBC: 5.8 10*3/uL (ref 4.0–10.5)

## 2015-05-12 LAB — I-STAT CHEM 8, ED
BUN: 27 mg/dL — AB (ref 6–20)
CHLORIDE: 109 mmol/L (ref 101–111)
CREATININE: 3 mg/dL — AB (ref 0.61–1.24)
Calcium, Ion: 1.11 mmol/L — ABNORMAL LOW (ref 1.13–1.30)
Glucose, Bld: 98 mg/dL (ref 65–99)
HEMATOCRIT: 38 % — AB (ref 39.0–52.0)
Hemoglobin: 12.9 g/dL — ABNORMAL LOW (ref 13.0–17.0)
Potassium: 4.4 mmol/L (ref 3.5–5.1)
Sodium: 137 mmol/L (ref 135–145)
TCO2: 17 mmol/L (ref 0–100)

## 2015-05-12 LAB — COMPREHENSIVE METABOLIC PANEL
ALT: 19 U/L (ref 17–63)
ANION GAP: 8 (ref 5–15)
AST: 24 U/L (ref 15–41)
Albumin: 3.4 g/dL — ABNORMAL LOW (ref 3.5–5.0)
Alkaline Phosphatase: 57 U/L (ref 38–126)
BUN: 27 mg/dL — AB (ref 6–20)
CALCIUM: 9 mg/dL (ref 8.9–10.3)
CO2: 21 mmol/L — AB (ref 22–32)
CREATININE: 2.96 mg/dL — AB (ref 0.61–1.24)
Chloride: 109 mmol/L (ref 101–111)
GFR calc non Af Amer: 18 mL/min — ABNORMAL LOW (ref >60)
GFR, EST AFRICAN AMERICAN: 21 mL/min — AB (ref >60)
Glucose, Bld: 100 mg/dL — ABNORMAL HIGH (ref 65–99)
Potassium: 4.4 mmol/L (ref 3.5–5.1)
SODIUM: 138 mmol/L (ref 135–145)
Total Bilirubin: 0.6 mg/dL (ref 0.3–1.2)
Total Protein: 6.4 g/dL — ABNORMAL LOW (ref 6.5–8.1)

## 2015-05-12 LAB — I-STAT TROPONIN, ED: TROPONIN I, POC: 0.04 ng/mL (ref 0.00–0.08)

## 2015-05-12 LAB — PROTIME-INR
INR: 1.22 (ref 0.00–1.49)
Prothrombin Time: 15.5 seconds — ABNORMAL HIGH (ref 11.6–15.2)

## 2015-05-12 LAB — I-STAT CG4 LACTIC ACID, ED
Lactic Acid, Venous: 1.81 mmol/L (ref 0.5–2.0)
Lactic Acid, Venous: 0.59 mmol/L (ref 0.5–2.0)

## 2015-05-12 MED ORDER — ACETAMINOPHEN 325 MG PO TABS
650.0000 mg | ORAL_TABLET | ORAL | Status: DC | PRN
  Administered 2015-05-13: 23:00:00 650 mg via ORAL
  Filled 2015-05-12: qty 2

## 2015-05-12 MED ORDER — ONDANSETRON HCL 4 MG/2ML IJ SOLN: 4.0000 mg | Freq: Four times a day (QID) | INTRAMUSCULAR | Status: DC | PRN

## 2015-05-12 MED ORDER — ISOSORBIDE MONONITRATE ER 30 MG PO TB24
30.0000 mg | ORAL_TABLET | Freq: Every evening | ORAL | Status: DC
  Administered 2015-05-12 – 2015-05-13 (×2): 30 mg via ORAL
  Filled 2015-05-12: qty 1

## 2015-05-12 MED ORDER — NITROGLYCERIN 0.4 MG SL SUBL: 0.4000 mg | SUBLINGUAL_TABLET | SUBLINGUAL | Status: DC | PRN

## 2015-05-12 MED ORDER — ALPRAZOLAM 0.25 MG PO TABS
0.2500 mg | ORAL_TABLET | Freq: Two times a day (BID) | ORAL | Status: DC | PRN
  Administered 2015-05-13: 0.25 mg via ORAL
  Filled 2015-05-12: qty 1

## 2015-05-12 MED ORDER — ONDANSETRON HCL 4 MG/2ML IJ SOLN
INTRAMUSCULAR | Status: AC
Start: 2015-05-12 — End: 2015-05-13
  Filled 2015-05-12: qty 2

## 2015-05-12 MED ORDER — FUROSEMIDE 40 MG PO TABS
40.0000 mg | ORAL_TABLET | Freq: Every day | ORAL | Status: DC
  Administered 2015-05-13 – 2015-05-14 (×2): 40 mg via ORAL
  Filled 2015-05-12 (×3): qty 1

## 2015-05-12 MED ORDER — DOCUSATE SODIUM 100 MG PO CAPS
100.0000 mg | ORAL_CAPSULE | Freq: Two times a day (BID) | ORAL | Status: DC
  Administered 2015-05-12 – 2015-05-14 (×4): 100 mg via ORAL
  Filled 2015-05-12 (×4): qty 1

## 2015-05-12 MED ORDER — ENOXAPARIN SODIUM 30 MG/0.3ML ~~LOC~~ SOLN
30.0000 mg | SUBCUTANEOUS | Status: DC
  Administered 2015-05-12 – 2015-05-13 (×2): 30 mg via SUBCUTANEOUS
  Filled 2015-05-12 (×3): qty 0.3

## 2015-05-12 MED ORDER — INFLUENZA VAC SPLIT QUAD 0.5 ML IM SUSY
0.5000 mL | PREFILLED_SYRINGE | INTRAMUSCULAR | Status: AC
  Administered 2015-05-13: 0.5 mL via INTRAMUSCULAR
  Filled 2015-05-12: qty 0.5

## 2015-05-12 MED ORDER — SODIUM CHLORIDE 0.9 % IV SOLN
INTRAVENOUS | Status: DC
  Administered 2015-05-12: 22:00:00 via INTRAVENOUS

## 2015-05-12 MED ORDER — ISOSORBIDE MONONITRATE 15 MG HALF TABLET
45.0000 mg | ORAL_TABLET | Freq: Two times a day (BID) | ORAL | Status: DC
  Filled 2015-05-12: qty 1

## 2015-05-12 MED ORDER — SODIUM CHLORIDE 0.9 % IJ SOLN: 3.0000 mL | INTRAMUSCULAR | Status: DC | PRN

## 2015-05-12 MED ORDER — LATANOPROST 0.005 % OP SOLN
1.0000 [drp] | Freq: Every day | OPHTHALMIC | Status: DC
  Administered 2015-05-12 – 2015-05-13 (×2): 1 [drp] via OPHTHALMIC
  Filled 2015-05-12 (×2): qty 2.5

## 2015-05-12 MED ORDER — DORZOLAMIDE HCL-TIMOLOL MAL 2-0.5 % OP SOLN
1.0000 [drp] | Freq: Two times a day (BID) | OPHTHALMIC | Status: DC
  Administered 2015-05-12 – 2015-05-14 (×4): 1 [drp] via OPHTHALMIC
  Filled 2015-05-12 (×2): qty 10

## 2015-05-12 MED ORDER — SODIUM CHLORIDE 0.9 % IV SOLN: 250.0000 mL | INTRAVENOUS | Status: DC | PRN

## 2015-05-12 MED ORDER — DSS 100 MG PO CAPS: 100.0000 mg | ORAL_CAPSULE | Freq: Two times a day (BID) | ORAL | Status: DC

## 2015-05-12 MED ORDER — HYDRALAZINE HCL 25 MG PO TABS
25.0000 mg | ORAL_TABLET | Freq: Two times a day (BID) | ORAL | Status: DC
  Administered 2015-05-12 – 2015-05-14 (×4): 25 mg via ORAL
  Filled 2015-05-12 (×4): qty 1

## 2015-05-12 MED ORDER — ISOSORBIDE MONONITRATE ER 60 MG PO TB24
60.0000 mg | ORAL_TABLET | Freq: Every day | ORAL | Status: DC
  Administered 2015-05-13 – 2015-05-14 (×2): 60 mg via ORAL
  Filled 2015-05-12 (×2): qty 1

## 2015-05-12 MED ORDER — ASPIRIN 81 MG PO CHEW
324.0000 mg | CHEWABLE_TABLET | Freq: Once | ORAL | Status: AC
  Administered 2015-05-12: 324 mg via ORAL
  Filled 2015-05-12: qty 4

## 2015-05-12 MED ORDER — PANTOPRAZOLE SODIUM 40 MG PO TBEC
40.0000 mg | DELAYED_RELEASE_TABLET | Freq: Every day | ORAL | Status: DC
  Administered 2015-05-13 – 2015-05-14 (×2): 40 mg via ORAL
  Filled 2015-05-12 (×3): qty 1

## 2015-05-12 MED ORDER — VITAMIN D 1000 UNITS PO TABS
1000.0000 [IU] | ORAL_TABLET | Freq: Two times a day (BID) | ORAL | Status: DC
  Administered 2015-05-12 – 2015-05-14 (×4): 1000 [IU] via ORAL
  Filled 2015-05-12 (×4): qty 1

## 2015-05-12 MED ORDER — ONDANSETRON HCL 4 MG/2ML IJ SOLN
4.0000 mg | Freq: Once | INTRAMUSCULAR | Status: AC
  Administered 2015-05-12: 4 mg via INTRAVENOUS

## 2015-05-12 MED ORDER — ASPIRIN EC 81 MG PO TBEC
81.0000 mg | DELAYED_RELEASE_TABLET | Freq: Every day | ORAL | Status: DC
  Administered 2015-05-13 – 2015-05-14 (×2): 81 mg via ORAL
  Filled 2015-05-12 (×2): qty 1

## 2015-05-12 MED ORDER — SODIUM CHLORIDE 0.9 % IJ SOLN
3.0000 mL | Freq: Two times a day (BID) | INTRAMUSCULAR | Status: DC
  Administered 2015-05-12 – 2015-05-13 (×3): 3 mL via INTRAVENOUS

## 2015-05-12 MED ORDER — ZOLPIDEM TARTRATE 5 MG PO TABS: 5.0000 mg | ORAL_TABLET | Freq: Every evening | ORAL | Status: DC | PRN

## 2015-05-12 NOTE — Progress Notes (Signed)
eLink Physician-Brief Progress Note Patient Name: Bradley [name] DOB: 1927/12/28 MRN: [id number]   Date of Service  05/12/2015  HPI/Events of Note  S/p arrest but back in SR and just needing nasal 02  eICU Interventions  No ccm interventions required      Intervention Category Major Interventions: Arrhythmia - evaluation and management  Christinia Gully 05/12/2015, 9:57 PM

## 2015-05-12 NOTE — ED Notes (Signed)
Portable Xray in proccess

## 2015-05-12 NOTE — Progress Notes (Signed)
   05/12/15 1310  Clinical Encounter Type  Visited With Patient and family together  Visit Type ED  Referral From Nurse  Spiritual Encounters  Spiritual Needs Prayer;Emotional  Stress Factors  Patient Stress Factors Loss of control;Health changes  Family Stress Factors Health changes;Major life changes;Lack of knowledge  Chaplain responded to trauma page, found daughter of patient outside open bay of trauma B. Talked to her for a few minutes to ascertain state of mind. Escorted daughter into room, prayed with her and her father until daughter's husband arrived and things seemed more stable.

## 2015-05-12 NOTE — ED Notes (Signed)
Pt arrives POv from home with daughter. Pts daughter states patient has been c/o blurred vision off and on for the last several weeks. Today was at [facility] pt developed sudden onset of RUQ abdominal pain and doubled over. Pt is awake, alert, states he feels weak all over.

## 2015-05-12 NOTE — ED Notes (Signed)
Pt is A&Ox4 at this time.

## 2015-05-12 NOTE — ED Notes (Signed)
Pt brought from triage after going unresponsive and pulseless.  CPR was preformed by triage nurse and EMT for aprox. 1.5 minutes.  Upon arrival to room pulses are present with spontaneous respirations and pt is alert.  Pt is vomiting at this time.

## 2015-05-12 NOTE — ED Notes (Signed)
Report given.

## 2015-05-12 NOTE — ED Provider Notes (Signed)
CSN: [id number]     Arrival date & time 05/12/15  1236 History   First MD Initiated Contact with Patient 05/12/15 1315     Chief Complaint  Patient presents with  . Hypotension  . Weakness     (Consider location/radiation/quality/duration/timing/severity/associated sxs/prior Treatment) Patient is a 81 y.o. male presenting with syncope.  Loss of Consciousness Episode history:  Single Most recent episode:  Today Duration: 1.5 minutes. Timing:  Constant Progression:  Resolved Chronicity:  New Context comment:  Pt was feeling poorly with generalized malaise, then RUQ abdominal pain.  Came to ED and went unresponsive while in triage.  Witnessed: yes   Relieved by: CPR. Associated symptoms: chest pain, nausea and vomiting   Associated symptoms: no difficulty breathing     Past Medical History  Diagnosis Date  . Hypertension   . Cataract   . Syncope and collapse     a. in 2014 and again in 08/2014. Thought to be vasovagal   . GERD (gastroesophageal reflux disease)   . Strabismus     a. right eye  . BPH (benign prostatic hypertrophy)   . PPD positive     a. HX POSITIVE PPD WITH NEGATIVE CXR  . Varicosities of leg   . Chronic kidney disease   . Dysrhythmia   . Arthritis   . Shortness of breath dyspnea   . Cardiomyopathy ([place])     a. thought to be ischemic with high risk MPS EF 23%. Reluctant to do cath due to CKD   Past Surgical History  Procedure Laterality Date  . Transurethral resection of prostate  2006  . Cataract extraction w/ intraocular lens implant      BOTH EYES  . Tonsillectomy      "I was young" (10/15/2012)  . Total knee arthroplasty Left 12/01/2012    Procedure: LEFT TOTAL KNEE ARTHROPLASTY;  Surgeon: [name], MD;  Location: [facility];  Service: Orthopedics;  Laterality: Left;   Family History  Problem Relation Age of Onset  . Pneumonia Father   . Heart failure Mother   . Heart attack Neg Hx   . Stroke Neg Hx   . Hypertension Mother   .  Hypertension Brother    Social History  Substance Use Topics  . Smoking status: Never Smoker   . Smokeless tobacco: Never Used  . Alcohol Use: No    Review of Systems  Cardiovascular: Positive for chest pain and syncope.  Gastrointestinal: Positive for nausea and vomiting.  All other systems reviewed and are negative.     Allergies  Review of patient's allergies indicates no known allergies.  Home Medications   Prior to Admission medications   Medication Sig Start Date End Date Taking? Authorizing Provider  aspirin EC 81 MG tablet Take 1 tablet (81 mg total) by mouth daily. 09/07/14   Belva Crome, MD  Calcium Carbonate-Vitamin D (CALCIUM + D PO) Take 600 mg by mouth 2 (two) times daily.    Historical Provider, MD  carvedilol (COREG) 3.125 MG tablet Take 1 and 1/2 Tablet Twice Daily 11/09/14   Belva Crome, MD  cefUROXime (CEFTIN) 250 MG tablet Take 1 tablet (250 mg total) by mouth 2 (two) times daily with a meal. 09/23/14   Eileen Stanford, PA-C  cholecalciferol (VITAMIN D) 1000 UNITS tablet Take 1,000 Units by mouth 2 (two) times daily.    Historical Provider, MD  Docusate Sodium (DSS) 100 MG CAPS Take 100 mg by mouth 2 (two) times daily. 12/26/12  Judeth Cornfield, NP  dorzolamide-timolol (COSOPT) 22.3-6.8 MG/ML ophthalmic solution 1 drop 2 (two) times daily.  08/21/14   Historical Provider, MD  furosemide (LASIX) 40 MG tablet Take 1 tablet (40 mg total) by mouth daily. 09/07/14   Belva Crome, MD  hydrALAZINE (APRESOLINE) 25 MG tablet Take 1 tablet (25 mg total) by mouth 2 (two) times daily. 09/07/14   Belva Crome, MD  isosorbide mononitrate (IMDUR) 60 MG 24 hr tablet Take 60 mg in the AM and 30 mg in the PM 09/28/14   Scott T Weaver, PA-C  latanoprost (XALATAN) 0.005 % ophthalmic solution Place 1 drop into both eyes at bedtime.    Historical Provider, MD  NITROSTAT 0.4 MG SL tablet PLACE 1 TABLET (0.4 MG TOTAL) UNDER THE TONGUE EVERY 5 (FIVE) MINUTES AS NEEDED. 04/29/15   Belva Crome, MD  omeprazole (PRILOSEC) 20 MG capsule Take 2 capsules (40 mg total) by mouth daily. 12/26/12   Judeth Cornfield, NP   BP 138/63 mmHg  Pulse 63  Temp(Src) 97.6 F (36.4 C) (Oral)  Resp 16  SpO2 97% Physical Exam  Constitutional: He is oriented to person, place, and time. He appears well-developed and well-nourished. He appears distressed.  HENT:  Head: Normocephalic and atraumatic.  Mouth/Throat: Oropharynx is clear and moist.  Eyes: Conjunctivae are normal. Pupils are equal, round, and reactive to light. No scleral icterus.  Neck: Neck supple.  Cardiovascular: Normal rate, regular rhythm, normal heart sounds and intact distal pulses.  Frequent extrasystoles are present.  No murmur heard. Pulmonary/Chest: Effort normal and breath sounds normal. No stridor. No respiratory distress. He has no wheezes. He has no rales.  Abdominal: Soft. He exhibits no distension. There is no tenderness.  Musculoskeletal: Normal range of motion. He exhibits no edema.  Neurological: He is alert and oriented to person, place, and time.  Skin: Skin is warm. No rash noted. He is diaphoretic.  Psychiatric: He has a normal mood and affect. His behavior is normal.  Nursing note and vitals reviewed.   ED Course  .Critical Care Performed by: Serita Grit Authorized by: Serita Grit Total critical care time: 45 minutes Critical care time was exclusive of separately billable procedures and treating other patients. Critical care was necessary to treat or prevent imminent or life-threatening deterioration of the following conditions: cardiac failure. Critical care was time spent personally by me on the following activities: development of treatment plan with patient or surrogate, discussions with consultants, evaluation of patient's response to treatment, examination of patient, obtaining history from patient or surrogate, ordering and performing treatments and interventions, ordering and review of laboratory  studies, ordering and review of radiographic studies, pulse oximetry, re-evaluation of patient's condition and review of old charts.   (including critical care time) Labs Review Labs Reviewed  CBC WITH DIFFERENTIAL/PLATELET - Abnormal; Notable for the following:    RBC 4.04 (*)    Hemoglobin 11.6 (*)    HCT 35.7 (*)    All other components within normal limits  COMPREHENSIVE METABOLIC PANEL - Abnormal; Notable for the following:    CO2 21 (*)    Glucose, Bld 100 (*)    BUN 27 (*)    Creatinine, Ser 2.96 (*)    Total Protein 6.4 (*)    Albumin 3.4 (*)    GFR calc non Af Amer 18 (*)    GFR calc Af Amer 21 (*)    All other components within normal limits  PROTIME-INR - Abnormal; Notable for the  following:    Prothrombin Time 15.5 (*)    All other components within normal limits  I-STAT CHEM 8, ED - Abnormal; Notable for the following:    BUN 27 (*)    Creatinine, Ser 3.00 (*)    Calcium, Ion 1.11 (*)    Hemoglobin 12.9 (*)    HCT 38.0 (*)    All other components within normal limits  I-STAT TROPOININ, ED  I-STAT CG4 LACTIC ACID, ED  I-STAT TROPOININ, ED  I-STAT CG4 LACTIC ACID, ED    Imaging Review Dg Chest Portable 1 View  05/12/2015  CLINICAL DATA:  Chest pain.  Bradycardia. EXAM: PORTABLE CHEST 1 VIEW COMPARISON:  September 21, 2014 FINDINGS: There is no edema or consolidation. The heart size and pulmonary vascularity are normal. No adenopathy. No pneumothorax. No bone lesions. IMPRESSION: No edema or consolidation. Electronically Signed   By: Lowella Grip III M.D.   On: 05/12/2015 13:19   I have personally reviewed and evaluated these images and lab results as part of my medical decision-making.   EKG Interpretation   Date/Time:  Thursday May 12 2015 12:59:40 EDT Ventricular Rate:  66 PR Interval:  187 QRS Duration: 115 QT Interval:  406 QTC Calculation: 425 R Axis:   81 Text Interpretation:  Sinus rhythm Atrial premature complex Nonspecific   intraventricular conduction delay Nonspecific repol abnormality, lateral  leads Baseline wander in lead(s) I II III aVL aVF V1 V6 similar to prior  Confirmed by [facility]  MD, TREY ([id number]) on 05/12/2015 1:16:18 PM      MDM   Final diagnoses:  Cardiac arrest [facility])    81 yo male presenting initially with malaise, nausea, and RUQ abdominal pain who then went unresponsive while in triage.  According to the triage nurses, he was hooked up to the EKG machine, they noticed it flatline, and saw him lose consciousness and lose pulses.  They performed CPR for approximately 1.5 minutes before he had ROSC.  After being moved to the resuscitation bay, he was awake and alert, was nauseated and vomiting, and complained of chest pain.  Abd was soft and nontender on repeated exams.    EKG did not show acute ischemia.  Labs unremarkable.  I have consulted cardiology for evaluation and admission.      Serita Grit, MD 05/12/15 [phone]

## 2015-05-12 NOTE — ED Notes (Signed)
[place] daughter call for updates/questions.

## 2015-05-12 NOTE — ED Notes (Signed)
Pt c/o soreness across his chest from the cpr earlier. Mild.  No distress talking

## 2015-05-12 NOTE — H&P (Signed)
History and Physical   Patient ID: Bradley [name] MRN: [id number], DOB/AGE: 1928-07-20 81 y.o. Date of Encounter: 05/12/2015  Primary Physician: Henrine Screws, MD Primary Cardiologist: Dr Tamala Julian  Chief Complaint:  Asystole   HPI: Bradley [name] is a 81 y.o. male with a history of S-CHF, CAD, CKD, HTN, hx neurally mediated syncope.   Today, he was in his usual state of health when he went with his daughter to pay some bills. They were in a drive-through at [facility], when he had sudden onset of blurred vision. He has problems with his right eye and uses drops in it. He has not used the drops today but admits that he thinks his vision was blurred in both eyes. His daughter drove him to the emergency room. He is a poor historian.  In the emergency room he was seen by the ER M.D. According to the ER M.D. notes, he was feeling poorly, had general malaise and some right upper quadrant abdominal pain. He was in triage and reportedly on telemetry when he had onset of asystole associated with loss of consciousness. He was pulseless and apneic, and CPR was required. He did not require shocks or intubation. CPR duration was approximately 1.5 minutes. No medications were used.  According to the patient, he became very nauseated and felt hot. When he woke up, his chest was sore. Other than his chest being sore, he has no current complaints. He feels his blurred vision has improved.  Upon review of telemetry, he had some significant bradycardia with heart rate less than 20. No compressions are seen on this and this may have been after the CPR incident.  Currently Bradley [name] is resting comfortably and has no specific complaints except soreness in his chest.  Per Dr. Thompson Caul office note from 11/09/2014: We .Marland Kitchen. a considerable conversation concerning end-of-life discussions. The daughter was present. They had apparently covered this discussion previously at home. He has been unable to  provide any definite guidance other than to say "when the [facility]liche is ready for him he will go". He is frightened about addressing the no code discussion.  Past Medical History  Diagnosis Date  . Hypertension   . Cataract   . Syncope and collapse     a. in 2014 and again in 08/2014. Thought to be vasovagal   . GERD (gastroesophageal reflux disease)   . Strabismus     a. right eye  . BPH (benign prostatic hypertrophy)   . PPD positive     a. HX POSITIVE PPD WITH NEGATIVE CXR  . Varicosities of leg   . Chronic kidney disease   . Dysrhythmia   . Arthritis   . Shortness of breath dyspnea   . Cardiomyopathy ([place])     a. thought to be ischemic with high risk MPS EF 23%. Reluctant to do cath due to CKD    Surgical History:  Past Surgical History  Procedure Laterality Date  . Transurethral resection of prostate  2006  . Cataract extraction w/ intraocular lens implant      BOTH EYES  . Tonsillectomy      "I was young" (10/15/2012)  . Total knee arthroplasty Left 12/01/2012    Procedure: LEFT TOTAL KNEE ARTHROPLASTY;  Surgeon: [name], MD;  Location: [facility];  Service: Orthopedics;  Laterality: Left;     I have reviewed the patient's current medications. Prior to Admission medications   Medication Sig Start Date End Date Taking? Authorizing Provider  aspirin EC 81 MG tablet Take 1 tablet (81 mg total) by mouth daily. 09/07/14   Belva Crome, MD  Calcium Carbonate-Vitamin D (CALCIUM + D PO) Take 600 mg by mouth 2 (two) times daily.    Historical Provider, MD  carvedilol (COREG) 3.125 MG tablet Take 1 and 1/2 Tablet Twice Daily 11/09/14   Belva Crome, MD  cefUROXime (CEFTIN) 250 MG tablet Take 1 tablet (250 mg total) by mouth 2 (two) times daily with a meal. 09/23/14   Eileen Stanford, PA-C  cholecalciferol (VITAMIN D) 1000 UNITS tablet Take 1,000 Units by mouth 2 (two) times daily.    Historical Provider, MD  Docusate Sodium (DSS) 100 MG CAPS Take 100 mg by mouth 2 (two) times daily.  12/26/12   Judeth Cornfield, NP  dorzolamide-timolol (COSOPT) 22.3-6.8 MG/ML ophthalmic solution 1 drop 2 (two) times daily.  08/21/14   Historical Provider, MD  furosemide (LASIX) 40 MG tablet Take 1 tablet (40 mg total) by mouth daily. 09/07/14   Belva Crome, MD  hydrALAZINE (APRESOLINE) 25 MG tablet Take 1 tablet (25 mg total) by mouth 2 (two) times daily. 09/07/14   Belva Crome, MD  isosorbide mononitrate (IMDUR) 60 MG 24 hr tablet Take 60 mg in the AM and 30 mg in the PM 09/28/14   Scott T Weaver, PA-C  latanoprost (XALATAN) 0.005 % ophthalmic solution Place 1 drop into both eyes at bedtime.    Historical Provider, MD  NITROSTAT 0.4 MG SL tablet PLACE 1 TABLET (0.4 MG TOTAL) UNDER THE TONGUE EVERY 5 (FIVE) MINUTES AS NEEDED. 04/29/15   Belva Crome, MD  omeprazole (PRILOSEC) 20 MG capsule Take 2 capsules (40 mg total) by mouth daily. 12/26/12   Judeth Cornfield, NP   Scheduled Meds: Continuous Infusions: PRN Meds:.  Allergies: No Known Allergies  Social History   Social History  . Marital Status: Married    Spouse Name: N/A  . Number of Children: N/A  . Years of Education: N/A   Occupational History  . Retired    Social History Main Topics  . Smoking status: Never Smoker   . Smokeless tobacco: Never Used  . Alcohol Use: No  . Drug Use: No  . Sexual Activity: No   Other Topics Concern  . Not on file   Social History Narrative   Lives with his wife.    Family History  Problem Relation Age of Onset  . Pneumonia Father   . Heart failure Mother   . Heart attack Neg Hx   . Stroke Neg Hx   . Hypertension Mother   . Hypertension Brother    Family Status  Relation Status Death Age  . Mother Deceased   . Father Deceased     Review of Systems:   Full 14-point review of systems otherwise negative except as noted above.  Physical Exam: Blood pressure 158/83, pulse 69, temperature 97.6 F (36.4 C), temperature source Oral, resp. rate 13, SpO2 100 %. General: Well developed, well  nourished,male in no acute distress. Head: Normocephalic, atraumatic, sclera non-icteric, no xanthomas, nares are without discharge. Dentition: poor Neck: No carotid bruits. JVD not elevated. No thyromegally Lungs: Good expansion bilaterally. without wheezes or rhonchi.  Heart: Regular rate and rhythm with S1 S2.  No S3 or S4.  No murmur, no rubs, or gallops appreciated. Abdomen: Soft, non-tender, non-distended with normoactive bowel sounds. No hepatomegaly. No rebound/guarding. No obvious abdominal masses. Msk:  Strength and tone appear normal for age.  No joint deformities or effusions, no spine or costo-vertebral angle tenderness. Extremities: No clubbing or cyanosis. Trace edema.  Distal pedal pulses are 2+ in 4 extrem Neuro: Alert and oriented X 2. Moves all extremities spontaneously. No focal deficits noted. Psych:  Responds to questions appropriately with a normal affect. Skin: No rashes or lesions noted  Labs:   Lab Results  Component Value Date   WBC 5.8 05/12/2015   HGB 12.9* 05/12/2015   HCT 38.0* 05/12/2015   MCV 88.4 05/12/2015   PLT 178 05/12/2015    Recent Labs  05/12/15 1300  INR 1.22     Recent Labs Lab 05/12/15 1300 05/12/15 1309  NA 138 137  K 4.4 4.4  CL 109 109  CO2 21*  --   BUN 27* 27*  CREATININE 2.96* 3.00*  CALCIUM 9.0  --   PROT 6.4*  --   BILITOT 0.6  --   ALKPHOS 57  --   ALT 19  --   AST 24  --   GLUCOSE 100* 98    Recent Labs  05/12/15 1307  TROPIPOC 0.04    Radiology/Studies: Dg Chest Portable 1 View 05/12/2015  CLINICAL DATA:  Chest pain.  Bradycardia. EXAM: PORTABLE CHEST 1 VIEW COMPARISON:  September 21, 2014 FINDINGS: There is no edema or consolidation. The heart size and pulmonary vascularity are normal. No adenopathy. No pneumothorax. No bone lesions. IMPRESSION: No edema or consolidation. Electronically Signed   By: Lowella Grip III M.D.   On: 05/12/2015 13:19    Echo: 09/23/2014 - Left ventricle: The cavity size was  normal. Wall thickness was increased in a pattern of moderate LVH. Systolic function was moderately to severely reduced. The estimated ejection fraction was in the range of 30% to 35%. There is akinesis of the mid-apicalinferior and inferoseptal myocardium. Doppler parameters are consistent with abnormal left ventricular relaxation (grade 1 diastolic dysfunction). - Aortic valve: There was mild regurgitation. - Left atrium: The atrium was mildly dilated. - Right ventricle: The cavity size was mildly dilated. Wall thickness was normal. - Pulmonary arteries: Systolic pressure was mildly increased. PA peak pressure: 34 mm Hg (S). Impressions: - New inferior wall motion abnormality and decreased EF when compared to prior echocardiogram.  ECG: 05/12/2015 Sinus rhythm with PACs Vent. rate 66 BPM PR interval 187 ms QRS duration 115 ms QT/QTc 406/425 ms P-R-T axes 54 81 157  Myoview 08/27/2014 Overall Impression: High risk stress nuclear study with a large scar in the entire inferior, basal and mid inferolateral wall with no ischemia. . LV Ejection Fraction: 23%. LV Wall Motion: Severely decreased LVEF with diffuse hypokinesis and akinesis of the entire inferior and inferolateral walls.   ASSESSMENT AND PLAN:  Principal Problem:   Cardiopulmonary arrest with successful resuscitation [facility]) - Telemetry reviewed. He has significant bradycardia with a heart rate approximately 15. No complexes are seen on this telemetry. The rhythm is sinus with probable second-degree AV block. However, the sinus rate remained very slow. - We'll admit to ICU. - He is on Coreg 3.125 mg, 1-1/2 tablets daily which will be held. - EP evaluation in a.m. to determine if he wants a pacemaker - See Dr. Thompson Caul note above regarding EOL discussion. For now he is a full code. - he has been made NPO after midnight.  Otherwise, continue home rx Active Problems:   Essential hypertension   Chronic  kidney disease, stage IV (severe) (HCC)   Chronic systolic HF (heart failure) (HCC)   Symptomatic bradycardia  Precordial chest pain   Signed, Bradley [name] 05/12/2015 5:54 PM Beeper [id number]  I have seen, examined and evaluated the patient this PM along with Ms. Ahmed Prima.  After reviewing all the available data and chart,  I agree with her findings, examination as well as impression recommendations.  81 yo male presenting initially with malaise, nausea, and RUQ abdominal pain who then went unresponsive while in triage. According to the triage nurses, he was hooked up to the EKG machine, they noticed it flatline, and saw him lose consciousness and lose pulses. They performed CPR for approximately 1.5 minutes before he had ROSC. After being moved to the resuscitation bay, he was awake and alert, was nauseated and vomiting, and complained of chest pain -okay to wear the CPR was performed. Abd was soft and nontender on repeated exams.  He has baseline "lazy eye on the right" The patient has likely ischemic cardiomyopathy with inferior scar on Myoview and reduced ejection fraction. No a invasive evaluation has been done to avoid contrast nephropathy. He has had a history of syncope in the past thought to be related to vasovagal but has never been captured on monitor. He came to the emergency room because of some abdominal pain and "feeling poorly "he also initially noticed blurry vision that was transient. This is what made his daughter bring him to the ER. Seem like upon arrival to the ER he had this episode. Unfortunately we don't have telemetry strips do show the patient's "asystole" event. However on telemetry following the event he has had some significant bradycardia that was probably both sinus bradycardia with first-degree AV block and intermittent second-degree AV block which is very difficult to tell due to PVCs whether it is type I or type 2 2 AVB.  He clearly denied having  any chest tightness or pressure or dyspnea associated with this event, but does note having some intermittent episodes of mostly exertional chest tightness that he takes nitroglycerin for. He says this happens maybe once a week if not that.  Does not sound like this event was ischemic related however.  It's hard to tell whether his nausea led to a vagal event versus nausea being a symptom from his bradycardia.  I think the best option is to admit him tonight monitor on telemetry, hold beta blocker, defer to electrophysiology to evaluate for any need for pacemaker versus plus minus ICD.    Leonie Man, M.D., M.S. Interventional Cardiologist   Pager # [phone]

## 2015-05-13 ENCOUNTER — Encounter (HOSPITAL_COMMUNITY)
Admission: EM | Disposition: A | Discharge status: Home / Self Care | Payer: Self-pay | Source: Home / Self Care | Attending: Cardiology

## 2015-05-13 ENCOUNTER — Encounter: Payer: Self-pay | Admitting: Internal Medicine

## 2015-05-13 DIAGNOSIS — I495 Sick sinus syndrome: Secondary | ICD-10-CM

## 2015-05-13 DIAGNOSIS — I469 Cardiac arrest, cause unspecified: Secondary | ICD-10-CM

## 2015-05-13 HISTORY — PX: EP IMPLANTABLE DEVICE: SHX172B

## 2015-05-13 LAB — TROPONIN I
Troponin I: <0.03 ng/mL (ref <0.031)
Troponin I: <0.03 ng/mL (ref <0.031)

## 2015-05-13 LAB — BASIC METABOLIC PANEL
Anion gap: 8 (ref 5–15)
BUN: 27 mg/dL — AB (ref 6–20)
CALCIUM: 8.7 mg/dL — AB (ref 8.9–10.3)
CHLORIDE: 106 mmol/L (ref 101–111)
CO2: 24 mmol/L (ref 22–32)
Creatinine, Ser: 2.79 mg/dL — ABNORMAL HIGH (ref 0.61–1.24)
GFR calc Af Amer: 22 mL/min — ABNORMAL LOW (ref >60)
GFR, EST NON AFRICAN AMERICAN: 19 mL/min — AB (ref >60)
GLUCOSE: 127 mg/dL — AB (ref 65–99)
POTASSIUM: 4.8 mmol/L (ref 3.5–5.1)
Sodium: 138 mmol/L (ref 135–145)

## 2015-05-13 LAB — MAGNESIUM: Magnesium: 1.8 mg/dL (ref 1.7–2.4)

## 2015-05-13 LAB — TSH: TSH: 1.311 u[IU]/mL (ref 0.350–4.500)

## 2015-05-13 LAB — MRSA PCR SCREENING: MRSA BY PCR: POSITIVE — AB

## 2015-05-13 SURGERY — PACEMAKER IMPLANT

## 2015-05-13 MED ORDER — HEPARIN (PORCINE) IN NACL 2-0.9 UNIT/ML-% IJ SOLN
INTRAMUSCULAR | Status: DC | PRN
  Administered 2015-05-13: 14:00:00

## 2015-05-13 MED ORDER — LIDOCAINE HCL (PF) 1 % IJ SOLN
INTRAMUSCULAR | Status: DC | PRN
  Administered 2015-05-13: 31 mL

## 2015-05-13 MED ORDER — SODIUM CHLORIDE 0.9 % IR SOLN
Status: DC | PRN
  Administered 2015-05-13: 15:00:00

## 2015-05-13 MED ORDER — CEFAZOLIN SODIUM 1-5 GM-% IV SOLN
1.0000 g | Freq: Four times a day (QID) | INTRAVENOUS | Status: AC
  Administered 2015-05-13 – 2015-05-14 (×3): 1 g via INTRAVENOUS
  Filled 2015-05-13 (×3): qty 50

## 2015-05-13 MED ORDER — MIDAZOLAM HCL 5 MG/5ML IJ SOLN
INTRAMUSCULAR | Status: AC
  Filled 2015-05-13: qty 25

## 2015-05-13 MED ORDER — MIDAZOLAM HCL 5 MG/5ML IJ SOLN
INTRAMUSCULAR | Status: DC | PRN
  Administered 2015-05-13 (×3): 1 mg via INTRAVENOUS

## 2015-05-13 MED ORDER — HEPARIN (PORCINE) IN NACL 2-0.9 UNIT/ML-% IJ SOLN
INTRAMUSCULAR | Status: AC
  Filled 2015-05-13: qty 500

## 2015-05-13 MED ORDER — HEPARIN SODIUM (PORCINE) 5000 UNIT/ML IJ SOLN: 5000.0000 [IU] | Freq: Three times a day (TID) | INTRAMUSCULAR | Status: DC

## 2015-05-13 MED ORDER — CHLORHEXIDINE GLUCONATE CLOTH 2 % EX PADS
6.0000 | MEDICATED_PAD | Freq: Every day | CUTANEOUS | Status: DC
  Administered 2015-05-13 – 2015-05-14 (×2): 6 via TOPICAL

## 2015-05-13 MED ORDER — CEFAZOLIN SODIUM-DEXTROSE 2-3 GM-% IV SOLR
INTRAVENOUS | Status: AC
  Filled 2015-05-13: qty 50

## 2015-05-13 MED ORDER — YOU HAVE A PACEMAKER BOOK
Freq: Once | Status: AC
  Administered 2015-05-14: 03:00:00
  Filled 2015-05-13: qty 1

## 2015-05-13 MED ORDER — CHLORHEXIDINE GLUCONATE 4 % EX LIQD
CUTANEOUS | Status: AC
  Filled 2015-05-13: qty 15

## 2015-05-13 MED ORDER — SODIUM CHLORIDE 0.9 % IR SOLN
Status: AC
  Filled 2015-05-13: qty 2

## 2015-05-13 MED ORDER — LIDOCAINE HCL (PF) 1 % IJ SOLN
INTRAMUSCULAR | Status: AC
  Filled 2015-05-13: qty 30

## 2015-05-13 MED ORDER — FENTANYL CITRATE (PF) 100 MCG/2ML IJ SOLN
INTRAMUSCULAR | Status: DC | PRN
  Administered 2015-05-13 (×3): 12.5 ug via INTRAVENOUS

## 2015-05-13 MED ORDER — MUPIROCIN 2 % EX OINT
1.0000 "application " | TOPICAL_OINTMENT | Freq: Two times a day (BID) | CUTANEOUS | Status: DC
  Administered 2015-05-13 – 2015-05-14 (×4): 1 via NASAL
  Filled 2015-05-13 (×3): qty 22

## 2015-05-13 MED ORDER — ACETAMINOPHEN 325 MG PO TABS: 325.0000 mg | ORAL_TABLET | ORAL | Status: DC | PRN

## 2015-05-13 MED ORDER — CEFAZOLIN SODIUM-DEXTROSE 2-3 GM-% IV SOLR
INTRAVENOUS | Status: DC | PRN
  Administered 2015-05-13: 2 g via INTRAVENOUS

## 2015-05-13 MED ORDER — FENTANYL CITRATE (PF) 100 MCG/2ML IJ SOLN
INTRAMUSCULAR | Status: AC
  Filled 2015-05-13: qty 4

## 2015-05-13 MED ORDER — ONDANSETRON HCL 4 MG/2ML IJ SOLN: 4.0000 mg | Freq: Four times a day (QID) | INTRAMUSCULAR | Status: DC | PRN

## 2015-05-13 SURGICAL SUPPLY — 7 items
CABLE SURGICAL S-101-97-12 (CABLE) ×2 IMPLANT
LEAD SOLIA S PRO MRI 53 (Lead) ×2 IMPLANT
LEAD SOLIA S PRO MRI 60 (Lead) ×2 IMPLANT
PAD DEFIB LIFELINK (PAD) ×2 IMPLANT
PPM ELUNA DR-T 394969 (Pacemaker) ×2 IMPLANT
SHEATH CLASSIC 7F (SHEATH) ×4 IMPLANT
TRAY PACEMAKER INSERTION (PACKS) ×2 IMPLANT

## 2015-05-13 NOTE — Consult Note (Signed)
ELECTROPHYSIOLOGY CONSULT NOTE    Patient ID: Bradley [name] MRN: [id number], DOB/AGE: [birth year] 81 y.o.  Admit date: 05/12/2015 Date of Consult: 05/13/2015  Primary Physician: Henrine Screws, MD Primary Cardiologist: Tamala Julian  Reason for Consultation: bradycardic arrest  HPI:  Bradley [name] is a 81 y.o. male with a past medical history significant for hypertension, prior syncope, CKD and likely ischemic cardiomyopathy(but no ischemic eval done 2/2 CKD and advanced age).  He was with his daughter yesterday in the drive through at [facility] when he developed visual disturbances with blurry vision. He did not have dizziness and did not pass out. His daughter then brought him to [facility] for further evaluation where he had an episode of asystole requiring brief CPR in the ER with subsequent recurrent bradycardia and HR's in the 20's.  He has been admitted to telemetry for further evaluation.  He is on Coreg 3.125mg  twice daily at home with last dose yesterday morning. He has underlying conduction system disease with prolongation of QRS.  EP has been asked to evaluate for treatment options.   He currently states that he is feeling well without chest pain or shortness of breath.  He has not had recent fevers or chills.  He has had prior syncope and there was concern for it being a vasovagal event.   Last echo 08/2014 demonstrated EF 30-35%, akinesis of the mid-apical inferior and inferoseptal mycoardium, grade 1 diastolic dysfunction, PA pressure 34.  Past Medical History  Diagnosis Date  . Hypertension   . Cataract   . Syncope and collapse     a. in 2014 and again in 08/2014. Thought to be vasovagal   . GERD (gastroesophageal reflux disease)   . Strabismus     a. right eye  . BPH (benign prostatic hypertrophy)   . PPD positive     a. HX POSITIVE PPD WITH NEGATIVE CXR  . Varicosities of leg   . Chronic kidney disease   . Dysrhythmia   . Arthritis   . Shortness of breath dyspnea    . Cardiomyopathy ([place])     a. thought to be ischemic with high risk MPS EF 23%. Reluctant to do cath due to CKD     Surgical History:  Past Surgical History  Procedure Laterality Date  . Transurethral resection of prostate  2006  . Cataract extraction w/ intraocular lens implant      BOTH EYES  . Tonsillectomy      "I was young" (10/15/2012)  . Total knee arthroplasty Left 12/01/2012    Procedure: LEFT TOTAL KNEE ARTHROPLASTY;  Surgeon: [name], MD;  Location: [facility] ORS;  Service: Orthopedics;  Laterality: Left;     Prescriptions prior to admission  Medication Sig Dispense Refill Last Dose  . aspirin EC 81 MG tablet Take 1 tablet (81 mg total) by mouth daily.   05/12/2015 at Unknown time  . carvedilol (COREG) 3.125 MG tablet Take 1 and 1/2 Tablet Twice Daily (Patient taking differently: Take 4.688 mg by mouth 2 (two) times daily with a meal. Take 1 and 1/2 Tablet Twice Daily) 90 tablet 5 05/12/2015 at Unknown time  . cholecalciferol (VITAMIN D) 1000 UNITS tablet Take 1,000 Units by mouth 2 (two) times daily.   05/12/2015 at Unknown time  . Docusate Sodium (DSS) 100 MG CAPS Take 100 mg by mouth 2 (two) times daily.   05/12/2015 at Unknown time  . dorzolamide-timolol (COSOPT) 22.3-6.8 MG/ML ophthalmic solution Place 1 drop into both eyes 2 (two)  times daily.    05/12/2015 at Unknown time  . furosemide (LASIX) 40 MG tablet Take 1 tablet (40 mg total) by mouth daily. 30 tablet 11 05/12/2015 at Unknown time  . hydrALAZINE (APRESOLINE) 25 MG tablet Take 1 tablet (25 mg total) by mouth 2 (two) times daily. 60 tablet 11 05/12/2015 at Unknown time  . isosorbide mononitrate (IMDUR) 60 MG 24 hr tablet Take 60 mg in the AM and 30 mg in the PM (Patient taking differently: Take 30-60 mg by mouth 2 (two) times daily. Take 60 mg in the AM and 30 mg in the PM) 90 tablet 3 05/12/2015 at Unknown time  . latanoprost (XALATAN) 0.005 % ophthalmic solution Place 1 drop into both eyes at bedtime.   05/11/2015  at Unknown time  . NITROSTAT 0.4 MG SL tablet PLACE 1 TABLET (0.4 MG TOTAL) UNDER THE TONGUE EVERY 5 (FIVE) MINUTES AS NEEDED. 25 tablet 0 unknown  . omeprazole (PRILOSEC) 20 MG capsule Take 2 capsules (40 mg total) by mouth daily. (Patient taking differently: Take 20 mg by mouth daily. )   05/12/2015 at Unknown time    Inpatient Medications:  . aspirin EC  81 mg Oral Daily  . Chlorhexidine Gluconate Cloth  6 each Topical Q0600  . cholecalciferol  1,000 Units Oral BID  . docusate sodium  100 mg Oral BID  . dorzolamide-timolol  1 drop Both Eyes BID  . enoxaparin (LOVENOX) injection  30 mg Subcutaneous Q24H  . furosemide  40 mg Oral Daily  . hydrALAZINE  25 mg Oral BID  . Influenza vac split quadrivalent PF  0.5 mL Intramuscular Tomorrow-1000  . isosorbide mononitrate  30 mg Oral QPM  . isosorbide mononitrate  60 mg Oral Daily  . latanoprost  1 drop Both Eyes QHS  . mupirocin ointment  1 application Nasal BID  . pantoprazole  40 mg Oral Daily  . sodium chloride  3 mL Intravenous Q12H    Allergies: No Known Allergies  Social History   Social History  . Marital Status: Married    Spouse Name: N/A  . Number of Children: N/A  . Years of Education: N/A   Occupational History  . Retired    Social History Main Topics  . Smoking status: Never Smoker   . Smokeless tobacco: Never Used  . Alcohol Use: No  . Drug Use: No  . Sexual Activity: No   Other Topics Concern  . Not on file   Social History Narrative   Lives with his wife.     Family History  Problem Relation Age of Onset  . Pneumonia Father   . Heart failure Mother   . Heart attack Neg Hx   . Stroke Neg Hx   . Hypertension Mother   . Hypertension Brother      Review of Systems: All other systems reviewed and are otherwise negative except as noted above.  Physical Exam: Filed Vitals:   05/13/15 0600 05/13/15 0657 05/13/15 0700 05/13/15 0800  BP: 163/90  168/98 165/83  Pulse: 60 70 56 68  Temp:   98 F  (36.7 C)   TempSrc:   Oral   Resp: 14 21 14 16   Height:      Weight:      SpO2: 99% 98% 100% 100%    GEN- The patient is elderly appearing, alert and oriented x 3 today.   HEENT: normocephalic, atraumatic; sclera clear, conjunctiva pink; hearing intact; oropharynx clear; neck supple, +strabismus Lungs- Clear to ausculation  bilaterally, normal work of breathing.  No wheezes, rales, rhonchi Heart- Regular rate and rhythm  GI- soft, non-tender, non-distended, bowel sounds present  Extremities- no clubbing, cyanosis, or edema  MS- no significant deformity or atrophy Skin- warm and dry, no rash or lesion Psych- euthymic mood, full affect Neuro- strength and sensation are intact  Labs:   Lab Results  Component Value Date   WBC 5.8 05/12/2015   HGB 12.9* 05/12/2015   HCT 38.0* 05/12/2015   MCV 88.4 05/12/2015   PLT 178 05/12/2015    Recent Labs Lab 05/12/15 1300  05/12/15 2310  NA 138  < > 138  K 4.4  < > 4.8  CL 109  < > 106  CO2 21*  --  24  BUN 27*  < > 27*  CREATININE 2.96*  < > 2.79*  CALCIUM 9.0  --  8.7*  PROT 6.4*  --   --   BILITOT 0.6  --   --   ALKPHOS 57  --   --   ALT 19  --   --   AST 24  --   --   GLUCOSE 100*  < > 127*  < > = values in this interval not displayed.    Radiology/Studies: Dg Chest Portable 1 View 05/12/2015  CLINICAL DATA:  Chest pain.  Bradycardia. EXAM: PORTABLE CHEST 1 VIEW COMPARISON:  September 21, 2014 FINDINGS: There is no edema or consolidation. The heart size and pulmonary vascularity are normal. No adenopathy. No pneumothorax. No bone lesions. IMPRESSION: No edema or consolidation. Electronically Signed   By: Lowella Grip III M.D.   On: 05/12/2015 13:19    [id number] rhythm, rate 69  TELEMETRY: sinus rhythm with frequent multifocal PVC's  Assessment/Plan: 1.  Symptomatic bradycardia  The patient has symptomatic bradycardia requiring a short period of CPR He has been on very low dose Coreg at home, but this will be  required long term for cardiomyopathy and frequent ventricular ectopy PPM implantation is recommended at this time. Risks, benefits reviewed with patient who would like for [place] to discuss with his daughter prior to proceeding. Will plan to talk with daughter later this morning and pacemaker later this afternoon With possible prior vasovagal syncope, would use Biotronik device with CLS technology  2.  Cardiomyopathy Presumed to be ischemic Evaluation deferred to date 2/2 CKD Continue medical therapy Up-titrate BB as able once PPM in place  3.  HTN BP elevated this morning, will up-titrate meds after PPM in place  4.  CKD Stable No change required today  Signed, Chanetta Marshall, NP 05/13/2015 8:40 AM  EP Attending  Patient seen and examined. Agree with the findings as noted above. His exam is notable for a RRR with clear lungs and minimal edema. I have discussed PPM insertion with the patient. We will await his daughters arrival for additional discussions. His advanced age and other comorbidities make him a poor candidate for an ICD. If he and his family are willing to proceed with PM, would suggest a device with closed loop stimulation.  Mikle Bosworth.D.

## 2015-05-13 NOTE — Care Management Note (Signed)
Case Management Note  Patient Details  Name: Bradley [name] MRN: [id number] Date of Birth: 05-31-1928  Subjective/Objective:     Adm w cardiac arrest               Action/Plan:lives w fam, pcp dr Herbie [place] gates  Expected Discharge Date:                 Expected Discharge Plan:     In-House Referral:     Discharge planning Services     Post Acute Care Choice:    Choice offered to:     DME Arranged:    DME Agency:     HH Arranged:    [place] Agency:     Status of Service:     Medicare Important Message Given:    Date Medicare IM Given:    Medicare IM give by:    Date Additional Medicare IM Given:    Additional Medicare Important Message give by:     If discussed at [place] of Stay Meetings, dates discussed:    Additional Comments: ur review done  Lacretia Leigh, RN 05/13/2015, 7:45 AM

## 2015-05-13 NOTE — Progress Notes (Signed)
CSW order received this morning for "support".  Please provide clarification for this. Unsure what is being asked of CSW.  Thanks!  Lorie Phenix. Pauline Good, [place]

## 2015-05-14 ENCOUNTER — Inpatient Hospital Stay (HOSPITAL_COMMUNITY): Payer: Medicare Other

## 2015-05-14 ENCOUNTER — Encounter (HOSPITAL_COMMUNITY): Payer: Self-pay | Admitting: Physician Assistant

## 2015-05-14 DIAGNOSIS — I10 Essential (primary) hypertension: Secondary | ICD-10-CM | POA: Diagnosis present

## 2015-05-14 DIAGNOSIS — Z950 Presence of cardiac pacemaker: Secondary | ICD-10-CM | POA: Diagnosis present

## 2015-05-14 DIAGNOSIS — R001 Bradycardia, unspecified: Secondary | ICD-10-CM | POA: Diagnosis present

## 2015-05-14 DIAGNOSIS — N400 Enlarged prostate without lower urinary tract symptoms: Secondary | ICD-10-CM | POA: Diagnosis present

## 2015-05-14 DIAGNOSIS — N189 Chronic kidney disease, unspecified: Secondary | ICD-10-CM | POA: Diagnosis present

## 2015-05-14 DIAGNOSIS — R55 Syncope and collapse: Secondary | ICD-10-CM | POA: Diagnosis present

## 2015-05-14 LAB — HEMOGLOBIN A1C
HEMOGLOBIN A1C: 5.6 % (ref 4.8–5.6)
MEAN PLASMA GLUCOSE: 114 mg/dL

## 2015-05-14 MED ORDER — CARVEDILOL 6.25 MG PO TABS: ORAL_TABLET | ORAL | Status: DC

## 2015-05-14 NOTE — Discharge Instructions (Signed)
° ° °  Supplemental Discharge Instructions for  Pacemaker/Defibrillator Patients  Activity No heavy lifting or vigorous activity with your left/right arm for 6 to 8 weeks.  Do not raise your left/right arm above your head for one week.  Gradually raise your affected arm as drawn below.             10/16                      10/17                       10/18                     10/19   NO DRIVING for  Until you are seen in the device clinic  And cleared.  WOUND CARE - Keep the wound area clean and dry.  Do not get this area wet for one week. No showers for one week; you may shower on     . - The tape/steri-strips on your wound will fall off; do not pull them off.  No bandage is needed on the site.  DO  NOT apply any creams, oils, or ointments to the wound area. - If you notice any drainage or discharge from the wound, any swelling or bruising at the site, or you develop a fever > 101? F after you are discharged home, call the office at once.  Special Instructions - You are still able to use cellular telephones; use the ear opposite the side where you have your pacemaker/defibrillator.  Avoid carrying your cellular phone near your device. - When traveling through airports, show security personnel your identification card to avoid being screened in the metal detectors.  Ask the security personnel to use the hand wand. - Avoid arc welding equipment, MRI testing (magnetic resonance imaging), TENS units (transcutaneous nerve stimulators).  Call the office for questions about other devices. - Avoid electrical appliances that are in poor condition or are not properly grounded. - Microwave ovens are safe to be near or to operate.  Additional information for defibrillator patients should your device go off: - If your device goes off ONCE and you feel fine afterward, notify the device clinic nurses. - If your device goes off ONCE and you do not feel well afterward, call 911. - If your device goes off  TWICE, call 911. - If your device goes off THREE times in one day, call 911.  DO NOT DRIVE YOURSELF OR A FAMILY MEMBER WITH A DEFIBRILLATOR TO THE HOSPITAL--CALL 911.

## 2015-05-14 NOTE — Discharge Summary (Signed)
Discharge Summary   Patient ID: Bradley [name] MRN: [id number], DOB/AGE: Sep 03, 1927 81 y.o. Admit date: 05/12/2015 D/C date:     05/14/2015  Primary Cardiologist: Dr. Tamala Julian   Principal Problem:   Symptomatic bradycardia Active Problems:   Essential hypertension   GERD (gastroesophageal reflux disease)   Chronic kidney disease, stage IV (severe) (HCC)   Chronic systolic HF (heart failure) (HCC)   Cardiomyopathy, ischemic   Chronic kidney disease   BPH (benign prostatic hypertrophy)   Syncope and collapse   Hypertension   Status post placement of cardiac pacemaker   Admission Dates: 05/12/15-05/14/15 Discharge Diagnosis: Symptomatic Bradycardia due to sinus node dysfunction s/p PPM: Biotronik serial number [id number] pacemaker.  HPI: Bradley [name] is a 81 y.o. male with a history of HTN, prior syncope, CKD and likely ischemic cardiomyopathy (but no ischemic eval done 2/2 CKD and advanced age) who was admitted to [facility] on 05/12/15 for bradycardic arrest.  He was with his daughter yesterday in the drive through at [facility] when he developed visual disturbances with blurry vision. He did not have dizziness and did not pass out. His daughter then brought him to [facility] for further evaluation where he had an episode of asystole requiring brief CPR in the ER with subsequent recurrent bradycardia and HR's in the 20's.He is on Coreg 3.125mg  twice daily at home with last dose the morning the day before presentation. He was noted to have underlying conduction system disease with prolongation of QRS.Last echo 08/2014 demonstrated EF 30-35%, akinesis of the mid-apical inferior and inferoseptal mycoardium, grade 1 diastolic dysfunction, PA pressure 34. He was admitted to telemetry for further evaluation and EP was consulted.   Hospital Course  Symptomatic bradycardia  -- The patient had symptomatic bradycardia requiring a short period of CPR.  -- He underwent successful implantation of  Biotronik serial [id number] pacemaker on 05/13/15. CXR with no evidence of PTX. The device has been interrogated and functioning properly. Beatric Fulop have wound care check set up for 10 days and follow up with Dr. Lovena Le in 3 months.  -- His coreg has been resumed at a higher dose (3.125mg  BID--> 6.25mg  BID) for treatment of his ICM and ventricular ectopy.  Cardiomyopathy/ chronic systolic CHF -- Presumed to be ischemic but evaluation deferred to date 2/2 CKD -- Continue ASA, statin and BB. Coreg has been increased to 6.25mg  BID after PPM placement. Continue hydralazine + nitrates as he is not a candidate for ACE/ARB due to CKD. Continue home lasix dosing  HTN- Continue hydralazine, imdur and coreg. Coreg increased s/p PPM as above.   CKD- Stable   The patient has had an uncomplicated hospital course and is recovering well. He has been seen by Dr. Curt Bears today and deemed ready for discharge home. Discharge medications are listed below.   Discharge Vitals: Blood pressure 120/74, pulse 75, temperature 97.8 F (36.6 C), temperature source Oral, resp. rate 18, height 6' (1.829 m), weight 200 lb 9.9 oz (91 kg), SpO2 97 %.  Labs: Lab Results  Component Value Date   WBC 5.8 05/12/2015   HGB 12.9* 05/12/2015   HCT 38.0* 05/12/2015   MCV 88.4 05/12/2015   PLT 178 05/12/2015    Recent Labs Lab 05/12/15 1300  05/12/15 2310  NA 138  < > 138  K 4.4  < > 4.8  CL 109  < > 106  CO2 21*  --  24  BUN 27*  < > 27*  CREATININE 2.96*  < > 2.79*  CALCIUM 9.0  --  8.7*  PROT 6.4*  --   --   BILITOT 0.6  --   --   ALKPHOS 57  --   --   ALT 19  --   --   AST 24  --   --   GLUCOSE 100*  < > 127*  < > = values in this interval not displayed.  Recent Labs  05/12/15 2310 05/13/15 0300 05/13/15 0950  TROPONINI <0.03 <0.03 <0.03   Lab Results  Component Value Date   CHOL 162 10/16/2012   HDL 39* 10/16/2012   LDLCALC 101* 10/16/2012   TRIG 110 10/16/2012    Physical Exam GEN- The patient is  elderly appearing, alert and oriented x 3 today.  HEENT: normocephalic, atraumatic; sclera clear, conjunctiva pink; hearing intact; oropharynx clear; neck supple, +strabismus Lungs- Clear to ausculation bilaterally, normal work of breathing. No wheezes, rales, rhonchi Heart- Regular rate and rhythm  GI- soft, non-tender, non-distended, bowel sounds present  Extremities- no clubbing, cyanosis, or edema  MS- no significant deformity or atrophy Skin- warm and dry, no rash or lesion Psych- euthymic mood, full affect Neuro- strength and sensation are intact    Diagnostic Studies/Procedures   Dg Chest Portable 1 View  05/12/2015  CLINICAL DATA:  Chest pain.  Bradycardia. EXAM: PORTABLE CHEST 1 VIEW COMPARISON:  September 21, 2014 FINDINGS: There is no edema or consolidation. The heart size and pulmonary vascularity are normal. No adenopathy. No pneumothorax. No bone lesions. IMPRESSION: No edema or consolidation. Electronically Signed   By: Lowella Grip III M.D.   On: 05/12/2015 13:19    Discharge Medications     Medication List    TAKE these medications        aspirin EC 81 MG tablet  Take 1 tablet (81 mg total) by mouth daily.     carvedilol 6.25 MG tablet  Commonly known as:  COREG  Take 1 tablet two times daily     cholecalciferol 1000 UNITS tablet  Commonly known as:  VITAMIN D  Take 1,000 Units by mouth 2 (two) times daily.     dorzolamide-timolol 22.3-6.8 MG/ML ophthalmic solution  Commonly known as:  COSOPT  Place 1 drop into both eyes 2 (two) times daily.     DSS 100 MG Caps  Take 100 mg by mouth 2 (two) times daily.     furosemide 40 MG tablet  Commonly known as:  LASIX  Take 1 tablet (40 mg total) by mouth daily.     hydrALAZINE 25 MG tablet  Commonly known as:  APRESOLINE  Take 1 tablet (25 mg total) by mouth 2 (two) times daily.     isosorbide mononitrate 60 MG 24 hr tablet  Commonly known as:  IMDUR  Take 60 mg in the AM and 30 mg in the PM       latanoprost 0.005 % ophthalmic solution  Commonly known as:  XALATAN  Place 1 drop into both eyes at bedtime.     NITROSTAT 0.4 MG SL tablet  Generic drug:  nitroGLYCERIN  PLACE 1 TABLET (0.4 MG TOTAL) UNDER THE TONGUE EVERY 5 (FIVE) MINUTES AS NEEDED.     omeprazole 20 MG capsule  Commonly known as:  PRILOSEC  Take 2 capsules (40 mg total) by mouth daily.        Disposition   The patient Osmin Welz be discharged in stable condition to home.  Follow-up Information    Follow up with Cristopher Peru, MD.  Specialty:  Cardiology   Why:  The office Quantel Mcinturff call you to make an appoinment., If you do not hear from them, please contact them., You should be seen in 10 days for a wound check and 3 months with Dr. Lonzo Candy information:   [id number] [place] [address] [phone]         Duration of Discharge Encounter: Greater than 30 minutes including physician and PA time.  SignedAngelena Form R PA-C 05/14/2015, 8:30 AM   I have seen and examined this patient with Angelena Form.  Agree with above, note added to reflect my findings.  On exam, regular rhythm, no murmurs.  Had pacemaker placed for bradycardia.  Plan for discharge with wound check in 10 days and follow up in 3 months.    Darnette Lampron M. Ahnesty Finfrock MD 05/14/2015 11:32 AM

## 2015-05-16 ENCOUNTER — Encounter (HOSPITAL_COMMUNITY): Payer: Self-pay | Admitting: Internal Medicine

## 2015-05-23 ENCOUNTER — Ambulatory Visit (INDEPENDENT_AMBULATORY_CARE_PROVIDER_SITE_OTHER): Payer: Medicare Other | Admitting: *Deleted

## 2015-05-23 ENCOUNTER — Encounter: Payer: Self-pay | Admitting: Internal Medicine

## 2015-05-23 DIAGNOSIS — Z950 Presence of cardiac pacemaker: Secondary | ICD-10-CM

## 2015-05-23 DIAGNOSIS — R001 Bradycardia, unspecified: Secondary | ICD-10-CM

## 2015-05-23 LAB — CUP PACEART INCLINIC DEVICE CHECK
Brady Statistic RA Percent Paced: 54 %
Brady Statistic RV Percent Paced: 3 %
Implantable Lead Implant Date: 20161016
Implantable Lead Serial Number: 49267833
Lead Channel Impedance Value: 507 Ohm
Lead Channel Pacing Threshold Amplitude: 0.8 V
Lead Channel Pacing Threshold Amplitude: 1 V
Lead Channel Pacing Threshold Pulse Width: 0.4 ms
Lead Channel Sensing Intrinsic Amplitude: 4.5 mV
Lead Channel Sensing Intrinsic Amplitude: 7.2 mV
Lead Channel Sensing Intrinsic Amplitude: 8.1 mV
Lead Channel Setting Pacing Amplitude: 3 V
MDC IDC LEAD IMPLANT DT: 20161016
MDC IDC LEAD LOCATION: 753859
MDC IDC LEAD LOCATION: 753860
MDC IDC LEAD SERIAL: 49296158
MDC IDC MSMT LEADCHNL RA IMPEDANCE VALUE: 487 Ohm
MDC IDC MSMT LEADCHNL RA PACING THRESHOLD AMPLITUDE: 0.8 V
MDC IDC MSMT LEADCHNL RA PACING THRESHOLD PULSEWIDTH: 0.4 ms
MDC IDC MSMT LEADCHNL RA PACING THRESHOLD PULSEWIDTH: 0.4 ms
MDC IDC MSMT LEADCHNL RA SENSING INTR AMPL: 4.5 mV
MDC IDC MSMT LEADCHNL RV PACING THRESHOLD AMPLITUDE: 0.8 V
MDC IDC MSMT LEADCHNL RV PACING THRESHOLD AMPLITUDE: 1 V
MDC IDC MSMT LEADCHNL RV PACING THRESHOLD PULSEWIDTH: 0.4 ms
MDC IDC MSMT LEADCHNL RV PACING THRESHOLD PULSEWIDTH: 0.4 ms
MDC IDC PG SERIAL: 68596848
MDC IDC SESS DTM: 20161024165426
MDC IDC SET LEADCHNL RA PACING AMPLITUDE: 3 V
MDC IDC SET LEADCHNL RV PACING PULSEWIDTH: 0.4 ms

## 2015-05-23 NOTE — Progress Notes (Signed)
Wound check appointment. Steri-strips removed. Wound without redness or edema. Incision edges approximated, wound well healed. Normal device function. Thresholds, sensing, and impedances consistent with implant measurements. Device programmed at 3.0V/auto capture programmed on for extra safety margin until 3 month visit. Histogram distribution appropriate for patient and level of activity. No mode switches or high ventricular rates noted. Patient educated about wound care, arm mobility, lifting restrictions. ROV w/ GT 08/16/15.

## 2015-06-16 ENCOUNTER — Other Ambulatory Visit: Payer: Self-pay | Admitting: Physician Assistant

## 2015-07-13 IMAGING — CR DG CHEST 1V PORT
1 series · 1 of 1 positions shown · non-contrast
Comparison: 10/15/2012

CLINICAL DATA: Syncope and nausea.

EXAM:
PORTABLE CHEST - 1 VIEW

[AP]
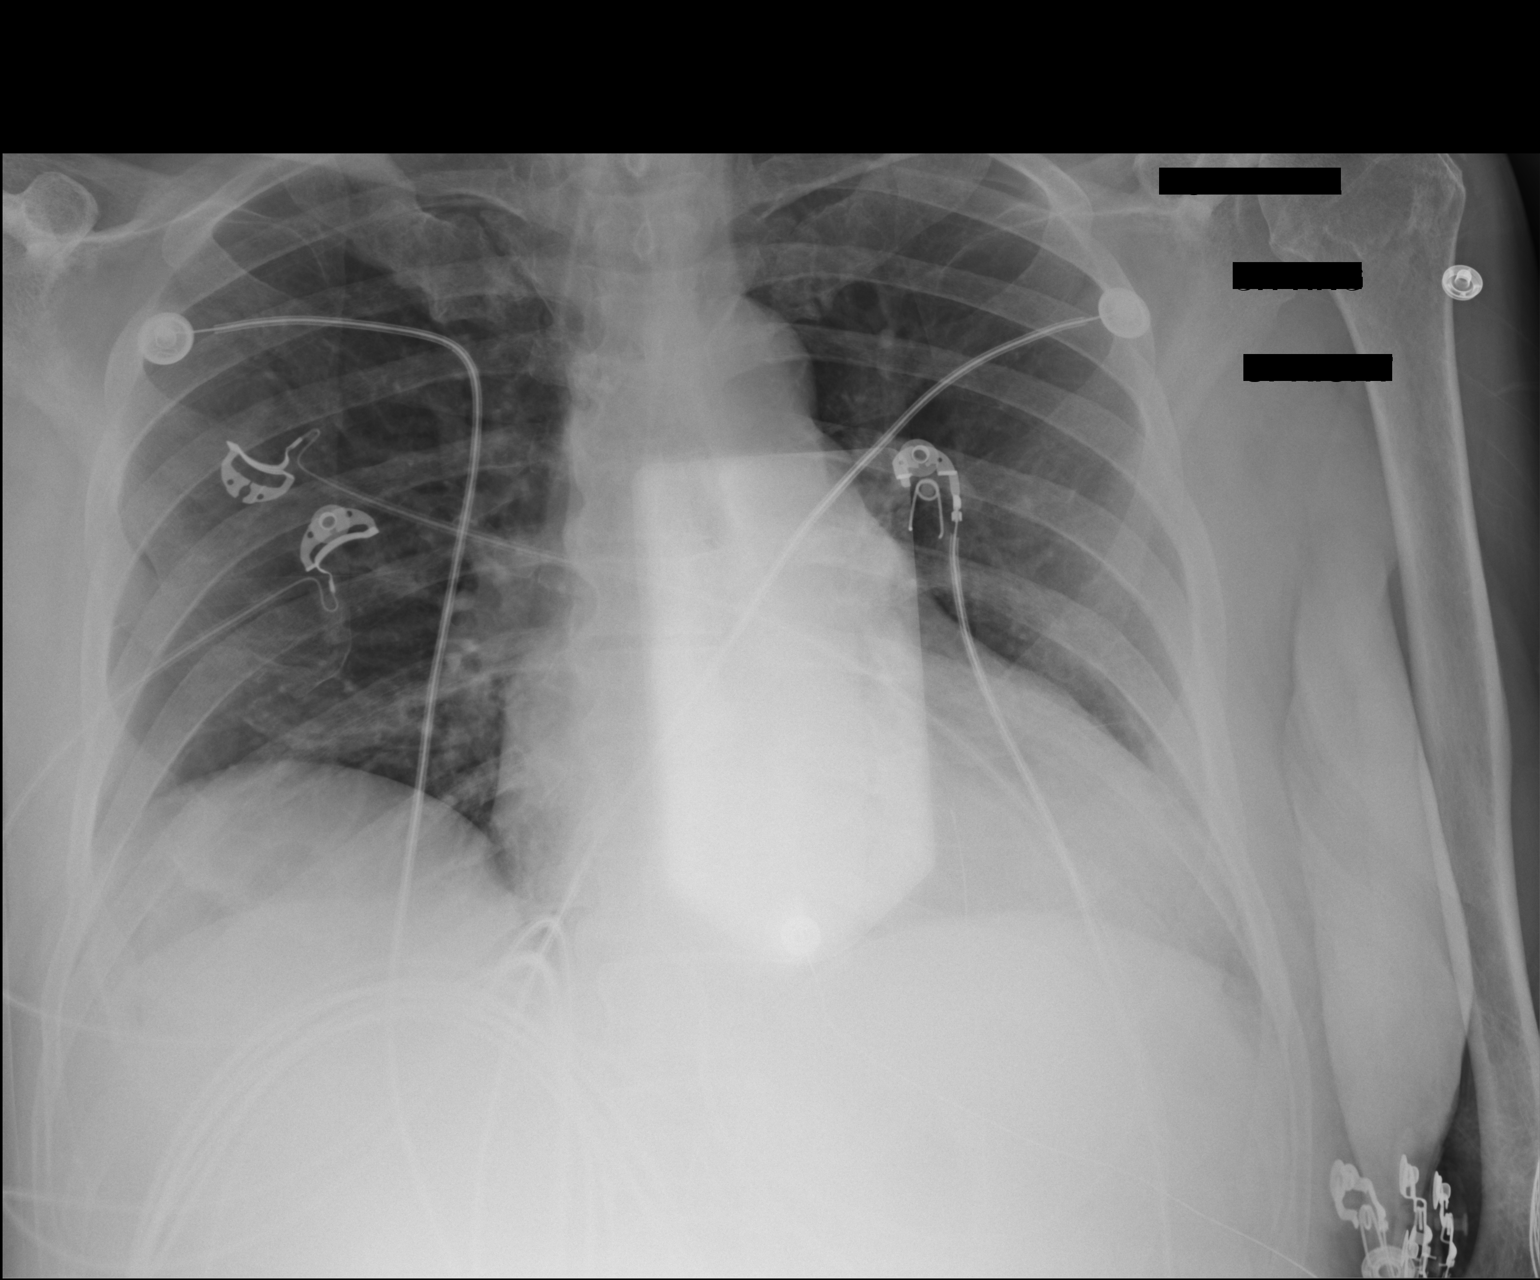

[1 of 1 positions shown; findings below may reference images not displayed]

FINDINGS: The heart size is at the upper limits of normal. Pacing pads are
present. There is stable elevation of the anterior right
hemidiaphragm with basilar atelectasis. There is no evidence of
pulmonary edema, consolidation, pneumothorax, nodule or pleural
fluid. Visualized bony structures are unremarkable.
IMPRESSION: Top-normal heart size. Right basilar atelectasis with stable
elevation of the anterior right hemidiaphragm.

## 2015-07-14 ENCOUNTER — Other Ambulatory Visit: Payer: Self-pay | Admitting: Interventional Cardiology

## 2015-08-02 ENCOUNTER — Other Ambulatory Visit: Payer: Self-pay | Admitting: Interventional Cardiology

## 2015-08-16 ENCOUNTER — Ambulatory Visit (INDEPENDENT_AMBULATORY_CARE_PROVIDER_SITE_OTHER): Payer: Medicare Other | Admitting: Internal Medicine

## 2015-08-16 ENCOUNTER — Encounter: Payer: Self-pay | Admitting: Internal Medicine

## 2015-08-16 VITALS — BP 152/78 | HR 78 | Ht 72.0 in | Wt 203.4 lb

## 2015-08-16 DIAGNOSIS — I255 Ischemic cardiomyopathy: Secondary | ICD-10-CM | POA: Diagnosis not present

## 2015-08-16 DIAGNOSIS — Z950 Presence of cardiac pacemaker: Secondary | ICD-10-CM | POA: Diagnosis not present

## 2015-08-16 DIAGNOSIS — R001 Bradycardia, unspecified: Secondary | ICD-10-CM | POA: Diagnosis not present

## 2015-08-16 DIAGNOSIS — I5022 Chronic systolic (congestive) heart failure: Secondary | ICD-10-CM

## 2015-08-16 LAB — CUP PACEART INCLINIC DEVICE CHECK
Brady Statistic RV Percent Paced: 4 %
Implantable Lead Implant Date: 20161016
Implantable Lead Implant Date: 20161016
Implantable Lead Location: 753859
Implantable Lead Location: 753860
Implantable Lead Serial Number: 49296158
Lead Channel Impedance Value: 546 Ohm
Lead Channel Pacing Threshold Amplitude: 0.8 V
Lead Channel Pacing Threshold Amplitude: 0.8 V
Lead Channel Pacing Threshold Amplitude: 0.8 V
Lead Channel Pacing Threshold Amplitude: 1.1 V
Lead Channel Pacing Threshold Amplitude: 1.2 V
Lead Channel Pacing Threshold Pulse Width: 0.4 ms
Lead Channel Pacing Threshold Pulse Width: 0.4 ms
Lead Channel Pacing Threshold Pulse Width: 0.4 ms
Lead Channel Pacing Threshold Pulse Width: 0.4 ms
Lead Channel Sensing Intrinsic Amplitude: 4.8 mV
Lead Channel Sensing Intrinsic Amplitude: 8.8 mV
Lead Channel Setting Pacing Amplitude: 2 V
MDC IDC LEAD SERIAL: 49267833
MDC IDC MSMT LEADCHNL RA PACING THRESHOLD PULSEWIDTH: 0.4 ms
MDC IDC MSMT LEADCHNL RA SENSING INTR AMPL: 4.8 mV
MDC IDC MSMT LEADCHNL RV IMPEDANCE VALUE: 507 Ohm
MDC IDC MSMT LEADCHNL RV PACING THRESHOLD AMPLITUDE: 1.2 V
MDC IDC MSMT LEADCHNL RV PACING THRESHOLD PULSEWIDTH: 0.4 ms
MDC IDC MSMT LEADCHNL RV SENSING INTR AMPL: 7.8 mV
MDC IDC PG SERIAL: 68596848
MDC IDC SESS DTM: 20170117144141
MDC IDC SET LEADCHNL RV PACING AMPLITUDE: 2.4 V
MDC IDC SET LEADCHNL RV PACING PULSEWIDTH: 0.4 ms
MDC IDC STAT BRADY RA PERCENT PACED: 66 %

## 2015-08-16 NOTE — Assessment & Plan Note (Signed)
He denies anginal symptoms. Will follow. 

## 2015-08-16 NOTE — Telephone Encounter (Signed)
This encounter was created in error - please disregard.

## 2015-08-16 NOTE — Progress Notes (Signed)
HPI Bradley [name] returns today for followup. He is a pleasant 82 yo man with chronic sinus node dysfunction and syncope, s/p PPM insertion. He also has HTN. In the interim, he has been stable. He denies chest pain or sob. He remains active. No synocpe. No Known Allergies   Current Outpatient Prescriptions  Medication Sig Dispense Refill  . aspirin EC 81 MG tablet Take 1 tablet (81 mg total) by mouth daily.    . carvedilol (COREG) 6.25 MG tablet Take 1 tablet two times daily 60 tablet 11  . cholecalciferol (VITAMIN D) 1000 UNITS tablet Take 1,000 Units by mouth 2 (two) times daily.    [name] Sodium (DSS) 100 MG CAPS Take 100 mg by mouth 2 (two) times daily.    . dorzolamide-timolol (COSOPT) 22.3-6.8 MG/ML ophthalmic solution Place 1 drop into both eyes 2 (two) times daily.     . furosemide (LASIX) 40 MG tablet TAKE 1 TABLET (40 MG TOTAL) BY MOUTH DAILY. 30 tablet 2  . hydrALAZINE (APRESOLINE) 25 MG tablet Take 1 tablet (25 mg total) by mouth 2 (two) times daily. 60 tablet 11  . isosorbide mononitrate (IMDUR) 60 MG 24 hr tablet TAKE 60 MG BY MOUTH IN THE AM AND 30 MG IN THE PM    . latanoprost (XALATAN) 0.005 % ophthalmic solution Place 1 drop into both eyes at bedtime.    Marland Kitchen NITROSTAT 0.4 MG SL tablet PLACE 1 TABLET (0.4 MG TOTAL) UNDER THE TONGUE EVERY 5 (FIVE) MINUTES AS NEEDED. 25 tablet 0  . omeprazole (PRILOSEC) 20 MG capsule Take 2 capsules (40 mg total) by mouth daily. (Patient taking differently: Take 20 mg by mouth daily. )     No current facility-administered medications for this visit.     Past Medical History  Diagnosis Date  . Hypertension   . Cataract   . Syncope and collapse     a. in 2014 and again in 08/2014. Thought to be vasovagal   . GERD (gastroesophageal reflux disease)   . Strabismus     a. right eye  . BPH (benign prostatic hypertrophy)   . PPD positive     a. HX POSITIVE PPD WITH NEGATIVE CXR  . Varicosities of leg   . Chronic kidney disease   .  Dysrhythmia   . Arthritis   . Cardiomyopathy ([place])     a. thought to be ischemic with high risk MPS EF 23%. Reluctant to do cath due to CKD  . Symptomatic bradycardia     a. s/p Biotronik serial [id number] pacemaker.  . Status post placement of cardiac pacemaker     a. 04/2015: bradycardic arrest s/p Biotronik serial [id number] pacemaker.    ROS:   All systems reviewed and negative except as noted in the HPI.   Past Surgical History  Procedure Laterality Date  . Transurethral resection of prostate  2006  . Cataract extraction w/ intraocular lens implant      BOTH EYES  . Tonsillectomy      "I was young" (10/15/2012)  . Total knee arthroplasty Left 12/01/2012    Procedure: LEFT TOTAL KNEE ARTHROPLASTY;  Surgeon: [name], MD;  Location: [facility];  Service: Orthopedics;  Laterality: Left;  . Ep implantable device N/A 05/13/2015    Procedure: Pacemaker Implant;  Surgeon: Evans Lance, MD;  Location: [facility];  Service: Cardiovascular;  Laterality: N/A;     Family History  Problem Relation Age of Onset  .  Pneumonia Father   . Heart failure Mother   . Heart attack Neg Hx   . Stroke Neg Hx   . Hypertension Mother   . Hypertension Brother      Social History   Social History  . Marital Status: Married    Spouse Name: N/A  . Number of Children: N/A  . Years of Education: N/A   Occupational History  . Retired    Social History Main Topics  . Smoking status: Never Smoker   . Smokeless tobacco: Never Used  . Alcohol Use: No  . Drug Use: No  . Sexual Activity: No   Other Topics Concern  . Not on file   Social History Narrative   Lives with his wife.     BP 152/78 mmHg  Pulse 78  Ht 6' (1.829 m)  Wt 203 lb 6.4 oz (92.262 kg)  BMI 27.58 kg/m2  Physical Exam:  Well appearing elderly man, NAD HEENT: Unremarkable Neck:  6 cm JVD, no thyromegally Lymphatics:  No adenopathy Back:  No CVA tenderness Lungs:  Clear with no wheezes HEART:  Regular  rate rhythm, no murmurs, no rubs, no clicks Abd:  soft, positive bowel sounds, no organomegally, no rebound, no guarding Ext:  2 plus pulses, no edema, no cyanosis, no clubbing Skin:  No rashes no nodules Neuro:  CN II through XII intact, motor grossly intact  EKG - nsr with atrial pacing  DEVICE  Normal device function.  See PaceArt for details.   Assess/Plan:

## 2015-08-16 NOTE — Assessment & Plan Note (Signed)
His Biotronik DDD PM is working normally. Will recheck in several months.

## 2015-08-16 NOTE — Assessment & Plan Note (Signed)
His symptoms are class 2. He will continue his current meds.  

## 2015-08-16 NOTE — Patient Instructions (Signed)
Medication Instructions:  Your physician recommends that you continue on your current medications as directed. Please refer to the Current Medication list given to you today.   Labwork: None ordered   Testing/Procedures: None ordered   Follow-Up: Your physician wants you to follow-up in: 9 months with Dr Knox Saliva will receive a reminder letter in the mail two months in advance. If you don't receive a letter, please call our office to schedule the follow-up appointment.  Remote monitoring is used to monitor your Pacemaker  from home. This monitoring reduces the number of office visits required to check your device to one time per year. It allows [place] to keep an eye on the functioning of your device to ensure it is working properly. You are scheduled for a device check from home on 11/15/15. You may send your transmission at any time that day. If you have a wireless device, the transmission will be sent automatically. After your physician reviews your transmission, you will receive a postcard with your next transmission date.   Any Other Special Instructions Will Be Listed Below (If Applicable).     If you need a refill on your cardiac medications before your next appointment, please call your pharmacy.

## 2015-09-26 ENCOUNTER — Other Ambulatory Visit: Payer: Self-pay | Admitting: Interventional Cardiology

## 2015-10-03 ENCOUNTER — Ambulatory Visit (INDEPENDENT_AMBULATORY_CARE_PROVIDER_SITE_OTHER): Payer: 59 | Admitting: Podiatry

## 2015-10-03 DIAGNOSIS — M79673 Pain in unspecified foot: Secondary | ICD-10-CM

## 2015-10-03 DIAGNOSIS — B351 Tinea unguium: Secondary | ICD-10-CM

## 2015-10-10 ENCOUNTER — Telehealth: Payer: Self-pay | Admitting: Interventional Cardiology

## 2015-10-10 NOTE — Telephone Encounter (Signed)
New message      Pt c/o medication issue:  1. Name of Medication: isosorbide  2. How are you currently taking this medication (dosage and times per day)? 60mg  in am and 60mg  in pm 3. Are you having a reaction (difficulty breathing--STAT)?  4. What is your medication issue? [facility] is calling to state that they typed the directions in error on pts prescription.  It should have been 1 tablet in am and 1/2 tablet in pm.  Patient has been taking 1 tablet in am and 1 tablet in pm for the past 4 months

## 2015-10-10 NOTE — Telephone Encounter (Signed)
Will fwd FYI to Dr.Smith

## 2015-10-15 NOTE — Telephone Encounter (Signed)
Change to 120 mg po daily as a single AM dose. Can use two 60 mg tablets and then needs new prescription for 120 mg tabs.

## 2015-10-17 NOTE — Telephone Encounter (Signed)
Called to give pt Dr.Smith's recommendation below. [email or web address] 

## 2015-10-18 NOTE — Telephone Encounter (Signed)
Pt aware of instructions below, with verbal understanding.

## 2015-10-18 NOTE — Telephone Encounter (Signed)
NEw MEssage  Bradley [name] from [facility] calling to clarify new refill instructions due to change in RX. Please call back and discuss.

## 2015-10-18 NOTE — Telephone Encounter (Signed)
Returned call to Gray, Pharm-D @ pt pharmacy [facility]. Adv her that Dr.Smith is aware of the pharmacy error of dispensing Imdur 60mg  bid for the last 4 months. Adv her that Dr.Smith wants pt to be on Imdur 120mg  each morning. Pharmacy should dispense 120mg  po each morning tab #30, R-2.   I have lmom for pt as well

## 2015-10-28 ENCOUNTER — Other Ambulatory Visit: Payer: Self-pay | Admitting: Interventional Cardiology

## 2015-11-08 ENCOUNTER — Other Ambulatory Visit: Payer: Self-pay | Admitting: Interventional Cardiology

## 2015-11-15 ENCOUNTER — Ambulatory Visit (INDEPENDENT_AMBULATORY_CARE_PROVIDER_SITE_OTHER): Payer: Medicare Other | Admitting: *Deleted

## 2015-11-15 DIAGNOSIS — R001 Bradycardia, unspecified: Secondary | ICD-10-CM | POA: Diagnosis not present

## 2015-11-15 DIAGNOSIS — Z950 Presence of cardiac pacemaker: Secondary | ICD-10-CM

## 2015-11-15 NOTE — Progress Notes (Signed)
Remote pacemaker transmission.   

## 2015-12-11 ENCOUNTER — Other Ambulatory Visit: Payer: Self-pay | Admitting: Interventional Cardiology

## 2015-12-23 ENCOUNTER — Encounter: Payer: Self-pay | Admitting: Cardiology

## 2015-12-23 LAB — CUP PACEART REMOTE DEVICE CHECK
Brady Statistic RV Percent Paced: 20 %
Date Time Interrogation Session: 20170607151925
Implantable Lead Implant Date: 20161016
Implantable Lead Location: 753859
Lead Channel Impedance Value: 507 Ohm
Lead Channel Pacing Threshold Amplitude: 0.9 V
Lead Channel Pacing Threshold Amplitude: 1.3 V
Lead Channel Pacing Threshold Pulse Width: 0.4 ms
MDC IDC LEAD IMPLANT DT: 20161016
MDC IDC LEAD LOCATION: 753860
MDC IDC LEAD SERIAL: 49267833
MDC IDC LEAD SERIAL: 49296158
MDC IDC MSMT LEADCHNL RA SENSING INTR AMPL: 3.3 mV
MDC IDC MSMT LEADCHNL RV IMPEDANCE VALUE: 488 Ohm
MDC IDC MSMT LEADCHNL RV PACING THRESHOLD PULSEWIDTH: 0.4 ms
MDC IDC MSMT LEADCHNL RV SENSING INTR AMPL: 6.5 mV
MDC IDC PG SERIAL: 68596848
MDC IDC STAT BRADY RA PERCENT PACED: 70 %
Pulse Gen Model: 394969

## 2015-12-28 ENCOUNTER — Other Ambulatory Visit: Payer: Self-pay | Admitting: Interventional Cardiology

## 2015-12-29 NOTE — Telephone Encounter (Signed)
Please advise on refill request as patient is overdue for an appointment with Dr Tamala Julian and has been given multiple warnings to call and schedule. His last two refill were only for #15. Thanks, MI

## 2016-01-19 ENCOUNTER — Other Ambulatory Visit: Payer: Self-pay | Admitting: Interventional Cardiology

## 2016-01-20 NOTE — Telephone Encounter (Signed)
Can Dr. Lovena Le refill this medication? Please advise

## 2016-01-23 NOTE — Telephone Encounter (Signed)
Dr Tamala Julian to fill

## 2016-02-14 ENCOUNTER — Ambulatory Visit (INDEPENDENT_AMBULATORY_CARE_PROVIDER_SITE_OTHER): Payer: Medicare Other | Admitting: *Deleted

## 2016-02-14 DIAGNOSIS — R001 Bradycardia, unspecified: Secondary | ICD-10-CM | POA: Diagnosis not present

## 2016-02-14 NOTE — Progress Notes (Signed)
Remote pacemaker transmission.   

## 2016-02-16 ENCOUNTER — Encounter: Payer: Self-pay | Admitting: Cardiology

## 2016-02-17 LAB — CUP PACEART REMOTE DEVICE CHECK
Brady Statistic RA Percent Paced: 72 %
Brady Statistic RV Percent Paced: 2 %
Implantable Lead Implant Date: 20161016
Implantable Lead Location: 753859
Implantable Lead Model: 377179
Implantable Lead Serial Number: 49267833
Implantable Lead Serial Number: 49296158
Lead Channel Pacing Threshold Amplitude: 1.3 V
Lead Channel Sensing Intrinsic Amplitude: 5.9 mV
MDC IDC LEAD IMPLANT DT: 20161016
MDC IDC LEAD LOCATION: 753860
MDC IDC MSMT LEADCHNL RA IMPEDANCE VALUE: 488 Ohm
MDC IDC MSMT LEADCHNL RA PACING THRESHOLD AMPLITUDE: 0.9 V
MDC IDC MSMT LEADCHNL RA PACING THRESHOLD PULSEWIDTH: 0.4 ms
MDC IDC MSMT LEADCHNL RA SENSING INTR AMPL: 3.1 mV
MDC IDC MSMT LEADCHNL RV IMPEDANCE VALUE: 468 Ohm
MDC IDC MSMT LEADCHNL RV PACING THRESHOLD PULSEWIDTH: 0.4 ms
MDC IDC PG SERIAL: 68596848
MDC IDC SESS DTM: 20170721105414

## 2016-02-23 ENCOUNTER — Telehealth: Payer: Self-pay | Admitting: Internal Medicine

## 2016-02-23 ENCOUNTER — Encounter: Payer: Self-pay | Admitting: *Deleted

## 2016-02-23 NOTE — Telephone Encounter (Signed)
Spoke with daughter and letter is done

## 2016-02-23 NOTE — Telephone Encounter (Signed)
New message   Pt daughter verbalized that she is calling for a letter for  To be written to [facility] in [place] (attention financial aid office)  That states that she was taking care of her father last year in April 2016 and October the 13 th 2016 was the second time that he was resuscitated and he had to have 24 hour care. She had to leave school because she is his only caregiver.   Pt was also able to become pt healthcare power of attorney and she said she can bring the information by she just wants it listed in the letter.

## 2016-02-27 ENCOUNTER — Ambulatory Visit (INDEPENDENT_AMBULATORY_CARE_PROVIDER_SITE_OTHER): Payer: 59 | Admitting: Podiatry

## 2016-02-27 DIAGNOSIS — M79673 Pain in unspecified foot: Secondary | ICD-10-CM

## 2016-02-27 DIAGNOSIS — B351 Tinea unguium: Secondary | ICD-10-CM

## 2016-02-28 ENCOUNTER — Telehealth: Payer: Self-pay | Admitting: Interventional Cardiology

## 2016-02-28 DIAGNOSIS — I5022 Chronic systolic (congestive) heart failure: Secondary | ICD-10-CM

## 2016-02-28 MED ORDER — ISOSORBIDE MONONITRATE ER 60 MG PO TB24: 60.0000 mg | ORAL_TABLET | Freq: Every day | ORAL | 2 refills | Status: DC

## 2016-02-28 MED ORDER — FUROSEMIDE 40 MG PO TABS: 40.0000 mg | ORAL_TABLET | Freq: Every day | ORAL | 2 refills | Status: DC

## 2016-02-28 NOTE — Telephone Encounter (Signed)
Per pts daughter  [name] cell# [phone]  Pt needs medication till appt out of these:  [place]

## 2016-02-28 NOTE — Progress Notes (Signed)
Subjective:     Patient ID: Bradley [name], male   DOB: [birth year], 82 y.o.   MRN: [id number]  HPI patient presents for nail debridement 1-5 both feet stating they're thick and bothersome   Review of Systems     Objective:   Physical Exam Neurovascular status intact muscle strength adequate with thick yellow brittle nailbeds 1-5 both feet that are painful    Assessment:     Mycotic nail infection with pain 1-5 both feet    Plan:     Debridement nailbeds 1-5 both feet with no iatrogenic bleeding noted

## 2016-02-28 NOTE — Telephone Encounter (Signed)
Pt medication was sent to pt's pharmacy, enough to last up to the appointment time. Confirmation received.

## 2016-05-07 ENCOUNTER — Other Ambulatory Visit: Payer: Self-pay | Admitting: Interventional Cardiology

## 2016-05-14 ENCOUNTER — Encounter: Payer: Self-pay | Admitting: Interventional Cardiology

## 2016-05-14 ENCOUNTER — Ambulatory Visit (INDEPENDENT_AMBULATORY_CARE_PROVIDER_SITE_OTHER): Payer: Medicare Other | Admitting: Interventional Cardiology

## 2016-05-14 VITALS — BP 90/56 | HR 80 | Ht 73.0 in | Wt 205.6 lb

## 2016-05-14 DIAGNOSIS — I2510 Atherosclerotic heart disease of native coronary artery without angina pectoris: Secondary | ICD-10-CM | POA: Diagnosis not present

## 2016-05-14 DIAGNOSIS — R55 Syncope and collapse: Secondary | ICD-10-CM

## 2016-05-14 DIAGNOSIS — I9589 Other hypotension: Secondary | ICD-10-CM

## 2016-05-14 DIAGNOSIS — I10 Essential (primary) hypertension: Secondary | ICD-10-CM

## 2016-05-14 DIAGNOSIS — Z950 Presence of cardiac pacemaker: Secondary | ICD-10-CM

## 2016-05-14 DIAGNOSIS — I5022 Chronic systolic (congestive) heart failure: Secondary | ICD-10-CM | POA: Diagnosis not present

## 2016-05-14 LAB — COMPREHENSIVE METABOLIC PANEL
ALK PHOS: 60 U/L (ref 40–115)
ALT: 10 U/L (ref 9–46)
AST: 13 U/L (ref 10–35)
Albumin: 3.5 g/dL — ABNORMAL LOW (ref 3.6–5.1)
BILIRUBIN TOTAL: 0.4 mg/dL (ref 0.2–1.2)
BUN: 39 mg/dL — AB (ref 7–25)
CO2: 22 mmol/L (ref 20–31)
CREATININE: 2.95 mg/dL — AB (ref 0.70–1.11)
Calcium: 8.5 mg/dL — ABNORMAL LOW (ref 8.6–10.3)
Chloride: 110 mmol/L (ref 98–110)
GLUCOSE: 82 mg/dL (ref 65–99)
POTASSIUM: 5.2 mmol/L (ref 3.5–5.3)
SODIUM: 139 mmol/L (ref 135–146)
TOTAL PROTEIN: 6.1 g/dL (ref 6.1–8.1)

## 2016-05-14 LAB — CBC WITH DIFFERENTIAL/PLATELET
BASOS PCT: 1 %
Basophils Absolute: 47 cells/uL (ref 0–200)
EOS ABS: 282 {cells}/uL (ref 15–500)
Eosinophils Relative: 6 %
HCT: 33.7 % — ABNORMAL LOW (ref 38.5–50.0)
Hemoglobin: 10.3 g/dL — ABNORMAL LOW (ref 13.2–17.1)
Lymphocytes Relative: 26 %
Lymphs Abs: 1222 cells/uL (ref 850–3900)
MCH: 27.1 pg (ref 27.0–33.0)
MCHC: 30.6 g/dL — ABNORMAL LOW (ref 32.0–36.0)
MCV: 88.7 fL (ref 80.0–100.0)
MONOS PCT: 14 %
MPV: 8.6 fL (ref 7.5–12.5)
Monocytes Absolute: 658 cells/uL (ref 200–950)
NEUTROS ABS: 2491 {cells}/uL (ref 1500–7800)
Neutrophils Relative %: 53 %
PLATELETS: 170 10*3/uL (ref 140–400)
RBC: 3.8 MIL/uL — AB (ref 4.20–5.80)
RDW: 13.6 % (ref 11.0–15.0)
WBC: 4.7 10*3/uL (ref 3.8–10.8)

## 2016-05-14 MED ORDER — ISOSORBIDE MONONITRATE ER 60 MG PO TB24: 60.0000 mg | ORAL_TABLET | Freq: Every day | ORAL | 6 refills | Status: DC

## 2016-05-14 NOTE — Progress Notes (Signed)
Cardiology Office Note    Date:  05/14/2016   ID:  Bradley [name], DOB [birth year], MRN [id number]  PCP:  Henrine Screws, MD  Cardiologist: Sinclair Grooms, MD   Chief Complaint  Patient presents with  . Congestive Heart Failure    History of Present Illness:  Bradley [name] is a 82 y.o. male who presents for systolic heart failure, coronary artery disease denoted by high-grade myocardial perfusion study with decision for medical therapy, chronic kidney disease, hypertension, and history of recurrent neurally mediated syncope. DDD pacemaker has been placed.   He feels fatigued. He denies syncope. There is dyspnea on exertion.. He has known severe LV systolic dysfunction on an ischemic basis. He has presumed multivessel coronary disease that was not catheter evaluated because of significant kidney impairment. At his age we have chosen conservative medical therapy. According to his daughter, his memory is failing. He is more frail. He does not drive. He's had no recurrence of syncope since admission from the office approximately one year ago. We felt he was having vasovagal episodes as well as high-grade AV block as the source of syncope.  Past Medical History:  Diagnosis Date  . Arthritis   . BPH (benign prostatic hypertrophy)   . Cardiomyopathy ([place])    a. thought to be ischemic with high risk MPS EF 23%. Reluctant to do cath due to CKD  . Cataract   . Chronic kidney disease   . Dysrhythmia   . GERD (gastroesophageal reflux disease)   . Hypertension   . PPD positive    a. HX POSITIVE PPD WITH NEGATIVE CXR  . Status post placement of cardiac pacemaker    a. 04/2015: bradycardic arrest s/p Biotronik serial #[id number] pacemaker.  . Strabismus    a. right eye  . Symptomatic bradycardia    a. s/p Biotronik serial [id number] pacemaker.  . Syncope and collapse    a. in 2014 and again in 08/2014. Thought to be vasovagal   . Varicosities of leg     Past Surgical  History:  Procedure Laterality Date  . CATARACT EXTRACTION W/ INTRAOCULAR LENS IMPLANT     BOTH EYES  . EP IMPLANTABLE DEVICE N/A 05/13/2015   Procedure: Pacemaker Implant;  Surgeon: Evans Lance, MD;  Location: [facility];  Service: Cardiovascular;  Laterality: N/A;  . TONSILLECTOMY     "I was young" (10/15/2012)  . TOTAL KNEE ARTHROPLASTY Left 12/01/2012   Procedure: LEFT TOTAL KNEE ARTHROPLASTY;  Surgeon: [name], MD;  Location: [facility];  Service: Orthopedics;  Laterality: Left;  . TRANSURETHRAL RESECTION OF PROSTATE  2006    Current Medications: Outpatient Medications Prior to Visit  Medication Sig Dispense Refill  . aspirin EC 81 MG tablet Take 1 tablet (81 mg total) by mouth daily.    . carvedilol (COREG) 6.25 MG tablet Take 1 tablet two times daily 60 tablet 11  . cholecalciferol (VITAMIN D) 1000 UNITS tablet Take 1,000 Units by mouth 2 (two) times daily.    . dorzolamide-timolol (COSOPT) 22.3-6.8 MG/ML ophthalmic solution Place 1 drop into both eyes 2 (two) times daily.     . furosemide (LASIX) 40 MG tablet TAKE 1 TABLET BY MOUTH EVERY DAY 30 tablet 1  . hydrALAZINE (APRESOLINE) 25 MG tablet TAKE 1 TABLET (25 MG TOTAL) BY MOUTH 2 (TWO) TIMES DAILY. 60 tablet 9  . latanoprost (XALATAN) 0.005 % ophthalmic solution Place 1 drop into both eyes at bedtime.    Marland Kitchen NITROSTAT  0.4 MG SL tablet PLACE 1 TABLET (0.4 MG TOTAL) UNDER THE TONGUE EVERY 5 (FIVE) MINUTES AS NEEDED. 25 tablet 0  . Docusate Sodium (DSS) 100 MG CAPS Take 100 mg by mouth 2 (two) times daily. (Patient not taking: Reported on 05/14/2016)    . isosorbide mononitrate (IMDUR) 60 MG 24 hr tablet Take 1 tablet (60 mg total) by mouth daily. TAKE 60 MG BY MOUTH IN THE AM AND 30 MG IN THE PM (Patient not taking: Reported on 05/14/2016) 45 tablet 2  . omeprazole (PRILOSEC) 20 MG capsule Take 2 capsules (40 mg total) by mouth daily. (Patient not taking: Reported on 05/14/2016)     No facility-administered medications  prior to visit.      Allergies:   Review of patient's allergies indicates no known allergies.   Social History   Social History  . Marital status: Married    Spouse name: N/A  . Number of children: N/A  . Years of education: N/A   Occupational History  . Retired    Social History Main Topics  . Smoking status: Never Smoker  . Smokeless tobacco: Never Used  . Alcohol use No  . Drug use: No  . Sexual activity: No   Other Topics Concern  . None   Social History Narrative   Lives with his wife.     Family History:  The patient's family history includes Heart failure in his mother; Hypertension in his brother and mother; Pneumonia in his father.   ROS:   Please see the history of present illness.    Daughter is the healthcare power of attorney  All other systems reviewed and are negative.   PHYSICAL EXAM:   VS:  BP (!) 90/56   Pulse 80   Ht 6\' 1"  (1.854 m)   Wt 205 lb 9.6 oz (93.3 kg)   BMI 27.13 kg/m    GEN: Well nourished, well developed, in no acute distress  HEENT: normal  Neck: no JVD, carotid bruits, or masses Cardiac: RRR; no murmurs, rubs, or gallops,no edema  Respiratory:  clear to auscultation bilaterally, normal work of breathing GI: soft, nontender, nondistended, + BS MS: no deformity or atrophy  Skin: warm and dry, no rash Neuro:  Alert and Oriented x 3, Strength and sensation are intact Psych: euthymic mood, full affect  Wt Readings from Last 3 Encounters:  05/14/16 205 lb 9.6 oz (93.3 kg)  08/16/15 203 lb 6.4 oz (92.3 kg)  05/14/15 200 lb 9.9 oz (91 kg)      Studies/Labs Reviewed:   EKG:  EKG  Not performed  Recent Labs: No results found for requested labs within last 8760 hours.   Lipid Panel    Component Value Date/Time   CHOL 162 10/16/2012 0510   TRIG 110 10/16/2012 0510   HDL 39 (L) 10/16/2012 0510   CHOLHDL 4.2 10/16/2012 0510   VLDL 22 10/16/2012 0510   LDLCALC 101 (H) 10/16/2012 0510    Additional studies/ records that  were reviewed today include:  No new data    ASSESSMENT:    1. Syncope and collapse   2. Essential hypertension   3. Chronic systolic HF (heart failure) ([place])   4. CAD in native artery   5. Status post placement of cardiac pacemaker   6. Other specified hypotension      PLAN:  In order of problems listed above:  1. No recurrent episodes of syncope since pacing. Blood pressure is low. Will decrease Imdur to 60  mg per day from 90 mg per day. Agree with decision to avoid future driving. 2. Please note that blood pressures relatively low and we may need to further decrease medical regimen intensity. For now, M.D. her is being decreased from 90-60 mg daily. 3. Continue 60 mg of Imdur, hydralazine 25 mg twice a day and carvedilol. Also furosemide 40 mg per day. Would check comprehensive metabolic panel and CBC today.. We need to establish CODE STATUS. His daughter is healthcare power of attorney.  4. Asymptomatic with reference to angina. 5. Has pacemaker follow-up tomorrow. 6. Please see #1 and #2 above.    Medication Adjustments/Labs and Tests Ordered: Current medicines are reviewed at length with the patient today.  Concerns regarding medicines are outlined above.  Medication changes, Labs and Tests ordered today are listed in the Patient Instructions below. There are no Patient Instructions on file for this visit.   Signed, Sinclair Grooms, MD  05/14/2016 10:25 AM    [address], [place], [place]  [address] Phone: [phone]; Fax: [phone]

## 2016-05-14 NOTE — Patient Instructions (Signed)
Medication Instructions:  Your physician has recommended you make the following change in your medication:  Decrease Imdur to 60 mg by mouth daily.     Labwork: Lab work to be done today--CBC, CMET  Testing/Procedures: none  Follow-Up: Your physician recommends that you schedule a follow-up appointment in: one month with Dr. Tamala Julian or PA/NP.     Any Other Special Instructions Will Be Listed Below (If Applicable).     If you need a refill on your cardiac medications before your next appointment, please call your pharmacy.

## 2016-05-15 ENCOUNTER — Encounter: Payer: Self-pay | Admitting: Internal Medicine

## 2016-05-15 ENCOUNTER — Ambulatory Visit (INDEPENDENT_AMBULATORY_CARE_PROVIDER_SITE_OTHER): Payer: Medicare Other | Admitting: Internal Medicine

## 2016-05-15 VITALS — BP 142/76 | HR 77 | Ht 73.0 in | Wt 206.2 lb

## 2016-05-15 DIAGNOSIS — I495 Sick sinus syndrome: Secondary | ICD-10-CM

## 2016-05-15 LAB — CUP PACEART INCLINIC DEVICE CHECK
Brady Statistic RA Percent Paced: 74 %
Brady Statistic RV Percent Paced: 3 %
Date Time Interrogation Session: 20171017125707
Implantable Lead Location: 753860
Implantable Lead Model: 377179
Implantable Lead Serial Number: 49296158
Lead Channel Impedance Value: 507 Ohm
Lead Channel Impedance Value: 526 Ohm
Lead Channel Pacing Threshold Amplitude: 0.8 V
Lead Channel Pacing Threshold Amplitude: 1.4 V
Lead Channel Pacing Threshold Pulse Width: 0.4 ms
Lead Channel Sensing Intrinsic Amplitude: 8.3 mV
Lead Channel Setting Pacing Amplitude: 2 V
Lead Channel Setting Pacing Amplitude: 2.4 V
MDC IDC LEAD IMPLANT DT: 20161016
MDC IDC LEAD IMPLANT DT: 20161016
MDC IDC LEAD LOCATION: 753859
MDC IDC LEAD SERIAL: 49267833
MDC IDC MSMT LEADCHNL RA PACING THRESHOLD AMPLITUDE: 0.8 V
MDC IDC MSMT LEADCHNL RA PACING THRESHOLD AMPLITUDE: 0.9 V
MDC IDC MSMT LEADCHNL RA PACING THRESHOLD PULSEWIDTH: 0.4 ms
MDC IDC MSMT LEADCHNL RA PACING THRESHOLD PULSEWIDTH: 0.4 ms
MDC IDC MSMT LEADCHNL RA PACING THRESHOLD PULSEWIDTH: 0.4 ms
MDC IDC MSMT LEADCHNL RA SENSING INTR AMPL: 4.2 mV
MDC IDC MSMT LEADCHNL RA SENSING INTR AMPL: 4.3 mV
MDC IDC MSMT LEADCHNL RV PACING THRESHOLD AMPLITUDE: 1.2 V
MDC IDC MSMT LEADCHNL RV PACING THRESHOLD AMPLITUDE: 1.2 V
MDC IDC MSMT LEADCHNL RV PACING THRESHOLD PULSEWIDTH: 0.4 ms
MDC IDC MSMT LEADCHNL RV PACING THRESHOLD PULSEWIDTH: 0.4 ms
MDC IDC MSMT LEADCHNL RV SENSING INTR AMPL: 7.9 mV
MDC IDC SET LEADCHNL RV PACING PULSEWIDTH: 0.4 ms
Pulse Gen Model: 394969
Pulse Gen Serial Number: 68596848

## 2016-05-15 NOTE — Progress Notes (Signed)
HPI Bradley [name] returns today for followup. He is a pleasant 82 yo man with chronic sinus node dysfunction and syncope, s/p PPM insertion. He also has HTN. In the interim, he has been stable. He denies chest pain or sob. He remains active. No syncope. No Known Allergies   Current Outpatient Prescriptions  Medication Sig Dispense Refill  . aspirin EC 81 MG tablet Take 1 tablet (81 mg total) by mouth daily.    . carvedilol (COREG) 6.25 MG tablet Take 1 tablet two times daily 60 tablet 11  . cholecalciferol (VITAMIN D) 1000 UNITS tablet Take 1,000 Units by mouth 2 (two) times daily.    Marland Kitchen docusate sodium (COLACE) 100 MG capsule Take 100 mg by mouth 2 (two) times daily as needed for mild constipation.    . dorzolamide-timolol (COSOPT) 22.3-6.8 MG/ML ophthalmic solution Place 1 drop into both eyes 2 (two) times daily.     . furosemide (LASIX) 40 MG tablet TAKE 1 TABLET BY MOUTH EVERY DAY 30 tablet 1  . hydrALAZINE (APRESOLINE) 25 MG tablet TAKE 1 TABLET (25 MG TOTAL) BY MOUTH 2 (TWO) TIMES DAILY. 60 tablet 9  . isosorbide mononitrate (IMDUR) 60 MG 24 hr tablet Take 1 tablet (60 mg total) by mouth daily. 30 tablet 6  . latanoprost (XALATAN) 0.005 % ophthalmic solution Place 1 drop into both eyes at bedtime.    Marland Kitchen NITROSTAT 0.4 MG SL tablet PLACE 1 TABLET (0.4 MG TOTAL) UNDER THE TONGUE EVERY 5 (FIVE) MINUTES AS NEEDED. 25 tablet 0  . omeprazole (PRILOSEC) 20 MG capsule Take 40 mg by mouth daily.  9   No current facility-administered medications for this visit.      Past Medical History:  Diagnosis Date  . Arthritis   . BPH (benign prostatic hypertrophy)   . Cardiomyopathy ([place])    a. thought to be ischemic with high risk MPS EF 23%. Reluctant to do cath due to CKD  . Cataract   . Chronic kidney disease   . Dysrhythmia   . GERD (gastroesophageal reflux disease)   . Hypertension   . PPD positive    a. HX POSITIVE PPD WITH NEGATIVE CXR  . Status post placement of cardiac pacemaker     a. 04/2015: bradycardic arrest s/p Biotronik serial #[id number] pacemaker.  . Strabismus    a. right eye  . Symptomatic bradycardia    a. s/p Biotronik serial [id number] pacemaker.  . Syncope and collapse    a. in 2014 and again in 08/2014. Thought to be vasovagal   . Varicosities of leg     ROS:   All systems reviewed and negative except as noted in the HPI.   Past Surgical History:  Procedure Laterality Date  . CATARACT EXTRACTION W/ INTRAOCULAR LENS IMPLANT     BOTH EYES  . EP IMPLANTABLE DEVICE N/A 05/13/2015   Procedure: Pacemaker Implant;  Surgeon: Evans Lance, MD;  Location: [facility];  Service: Cardiovascular;  Laterality: N/A;  . TONSILLECTOMY     "I was young" (10/15/2012)  . TOTAL KNEE ARTHROPLASTY Left 12/01/2012   Procedure: LEFT TOTAL KNEE ARTHROPLASTY;  Surgeon: [name], MD;  Location: [facility];  Service: Orthopedics;  Laterality: Left;  . TRANSURETHRAL RESECTION OF PROSTATE  2006     Family History  Problem Relation Age of Onset  . Heart failure Mother   . Hypertension Mother   . Pneumonia Father   . Hypertension Brother   . Heart attack  Neg Hx   . Stroke Neg Hx      Social History   Social History  . Marital status: Married    Spouse name: N/A  . Number of children: N/A  . Years of education: N/A   Occupational History  . Retired    Social History Main Topics  . Smoking status: Never Smoker  . Smokeless tobacco: Never Used  . Alcohol use No  . Drug use: No  . Sexual activity: No   Other Topics Concern  . Not on file   Social History Narrative   Lives with his wife.     BP (!) 142/76   Pulse 77   Ht 6\' 1"  (1.854 m)   Wt 206 lb 3.2 oz (93.5 kg)   SpO2 97%   BMI 27.20 kg/m   Physical Exam:  Well appearing elderly man, NAD HEENT: Unremarkable Neck:  6 cm JVD, no thyromegally Lymphatics:  No adenopathy Back:  No CVA tenderness Lungs:  Clear with no wheezes HEART:  Regular rate rhythm, no murmurs, no rubs, no  clicks Abd:  soft, positive bowel sounds, no organomegally, no rebound, no guarding Ext:  2 plus pulses, no edema, no cyanosis, no clubbing Skin:  No rashes no nodules Neuro:  CN II through XII intact, motor grossly intact  EKG - nsr with atrial pacing  DEVICE  Normal device function.  See PaceArt for details.   Assess/Plan: 1. Sinus node dysfunction - he is s/p PPM and is asymptomatic 2. PPM - his biotronik DDD PM is working normally. Will follow 3. HTN - his systolic pressure is up a bit. Will follow. I have asked him to reduce his salt intake.  Mikle Bosworth.D.

## 2016-05-15 NOTE — Patient Instructions (Signed)

## 2016-05-25 ENCOUNTER — Other Ambulatory Visit: Payer: Self-pay | Admitting: Physician Assistant

## 2016-06-07 ENCOUNTER — Ambulatory Visit (INDEPENDENT_AMBULATORY_CARE_PROVIDER_SITE_OTHER): Payer: Medicare Other | Admitting: Podiatry

## 2016-06-07 DIAGNOSIS — B351 Tinea unguium: Secondary | ICD-10-CM

## 2016-06-07 DIAGNOSIS — M79676 Pain in unspecified toe(s): Secondary | ICD-10-CM | POA: Diagnosis not present

## 2016-06-07 DIAGNOSIS — M79604 Pain in right leg: Secondary | ICD-10-CM

## 2016-06-07 DIAGNOSIS — M79605 Pain in left leg: Secondary | ICD-10-CM

## 2016-06-07 NOTE — Progress Notes (Signed)
Subjective:     Patient ID: Bradley [name], male   DOB: 1927-10-07, 82 y.o.   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

## 2016-06-18 ENCOUNTER — Encounter: Payer: Self-pay | Admitting: Nurse Practitioner

## 2016-06-18 ENCOUNTER — Ambulatory Visit (INDEPENDENT_AMBULATORY_CARE_PROVIDER_SITE_OTHER): Payer: Medicare Other | Admitting: Nurse Practitioner

## 2016-06-18 ENCOUNTER — Encounter (INDEPENDENT_AMBULATORY_CARE_PROVIDER_SITE_OTHER): Payer: Self-pay

## 2016-06-18 VITALS — BP 98/60 | HR 69 | Ht 73.0 in | Wt 179.0 lb

## 2016-06-18 DIAGNOSIS — I255 Ischemic cardiomyopathy: Secondary | ICD-10-CM

## 2016-06-18 DIAGNOSIS — R55 Syncope and collapse: Secondary | ICD-10-CM

## 2016-06-18 DIAGNOSIS — I10 Essential (primary) hypertension: Secondary | ICD-10-CM

## 2016-06-18 DIAGNOSIS — I5022 Chronic systolic (congestive) heart failure: Secondary | ICD-10-CM | POA: Diagnosis not present

## 2016-06-18 NOTE — Patient Instructions (Addendum)
We will be checking the following labs today - NONE   Medication Instructions:    Continue with your current medicines.     Testing/Procedures To Be Arranged:  N/A  Follow-Up:   See Dr. Tamala Julian in about 4 months    Other Special Instructions:   N/A    If you need a refill on your cardiac medications before your next appointment, please call your pharmacy.   Call the [facility] office at [phone] if you have any questions, problems or concerns.

## 2016-06-18 NOTE — Progress Notes (Signed)
CARDIOLOGY OFFICE NOTE  Date:  06/18/2016    [name] Date of Birth: 1928-05-01 Medical Record #[id number]  PCP:  Henrine Screws, MD  Cardiologist:  Smith/Taylor  Chief Complaint  Patient presents with  . Cardiomyopathy  . Coronary Artery Disease    Follow up visit - seen for Dr. Tamala Julian    History of Present Illness: Bradley [name] is a 82 y.o. male who presents today for a one month check. Seen for Dr. Tamala Julian.   He has a history of systolic heart failure, coronary artery disease denoted by high-grade myocardial perfusion study with decision for medical therapy back in 2016, chronic kidney disease, hypertension, and history of recurrent neurally mediated syncope. DDD pacemaker has been placed - followed by Dr. Lovena Le.  Seen last month by Dr. Tamala Julian - more fatigued. BP low. Imdur cut back. He had DOE. He has known severe LV systolic dysfunction on an ischemic basis. He has presumed multivessel coronary disease and was not re-cathed due to significant CAD. Due to his age Dr. Tamala Julian has chosen conservative medical therapy. According to his daughter, his memory is failing. He is frail. He does not drive.   Saw Dr. Lovena Le the day after he saw Dr. Tamala Julian - BP a little higher - otherwise felt to be doing well. Normal PPM function noted.   Comes in today. Here with his daughter today. Imdur was cut back last month. She feels like he is doing ok - "holding steady" with no real concerns. Not dizzy or lightheaded. Short of breath with activity but unchanged. No chest pain noted. Swelling resolved. Does not check his BP at home. No falls.   Past Medical History:  Diagnosis Date  . Arthritis   . BPH (benign prostatic hypertrophy)   . Cardiomyopathy ([place])    a. thought to be ischemic with high risk MPS EF 23%. Reluctant to do cath due to CKD  . Cataract   . Chronic kidney disease   . Dysrhythmia   . GERD (gastroesophageal reflux disease)   . Hypertension   . PPD positive     a. HX POSITIVE PPD WITH NEGATIVE CXR  . Status post placement of cardiac pacemaker    a. 04/2015: bradycardic arrest s/p Biotronik serial #[id number] pacemaker.  . Strabismus    a. right eye  . Symptomatic bradycardia    a. s/p Biotronik serial [id number] pacemaker.  . Syncope and collapse    a. in 2014 and again in 08/2014. Thought to be vasovagal   . Varicosities of leg     Past Surgical History:  Procedure Laterality Date  . CATARACT EXTRACTION W/ INTRAOCULAR LENS IMPLANT     BOTH EYES  . EP IMPLANTABLE DEVICE N/A 05/13/2015   Procedure: Pacemaker Implant;  Surgeon: Evans Lance, MD;  Location: [facility];  Service: Cardiovascular;  Laterality: N/A;  . TONSILLECTOMY     "I was young" (10/15/2012)  . TOTAL KNEE ARTHROPLASTY Left 12/01/2012   Procedure: LEFT TOTAL KNEE ARTHROPLASTY;  Surgeon: [name], MD;  Location: [facility];  Service: Orthopedics;  Laterality: Left;  . TRANSURETHRAL RESECTION OF PROSTATE  2006     Medications: Current Outpatient Prescriptions  Medication Sig Dispense Refill  . aspirin EC 81 MG tablet Take 1 tablet (81 mg total) by mouth daily.    . carvedilol (COREG) 6.25 MG tablet Take 1 tablet (6.25 mg total) by mouth 2 (two) times daily. 60 tablet 11  . cholecalciferol (VITAMIN D)  1000 UNITS tablet Take 1,000 Units by mouth 2 (two) times daily.    Marland Kitchen docusate sodium (COLACE) 100 MG capsule Take 100 mg by mouth 2 (two) times daily as needed for mild constipation.    . dorzolamide-timolol (COSOPT) 22.3-6.8 MG/ML ophthalmic solution Place 1 drop into both eyes 2 (two) times daily.     . furosemide (LASIX) 40 MG tablet TAKE 1 TABLET BY MOUTH EVERY DAY 30 tablet 1  . hydrALAZINE (APRESOLINE) 25 MG tablet TAKE 1 TABLET (25 MG TOTAL) BY MOUTH 2 (TWO) TIMES DAILY. 60 tablet 9  . isosorbide mononitrate (IMDUR) 60 MG 24 hr tablet Take 1 tablet (60 mg total) by mouth daily. 30 tablet 6  . latanoprost (XALATAN) 0.005 % ophthalmic solution Place 1 drop into  both eyes at bedtime.    . Multiple Vitamin (MULTIVITAMIN WITH MINERALS) TABS tablet Take 1 tablet by mouth daily.    Marland Kitchen NITROSTAT 0.4 MG SL tablet PLACE 1 TABLET (0.4 MG TOTAL) UNDER THE TONGUE EVERY 5 (FIVE) MINUTES AS NEEDED. 25 tablet 0  . omeprazole (PRILOSEC) 20 MG capsule Take 40 mg by mouth daily.  9   No current facility-administered medications for this visit.     Allergies: No Known Allergies  Social History: The patient  reports that he has never smoked. He has never used smokeless tobacco. He reports that he does not drink alcohol or use drugs.   Family History: The patient's family history includes Heart failure in his mother; Hypertension in his brother and mother; Pneumonia in his father.   Review of Systems: Please see the history of present illness.   Otherwise, the review of systems is positive for none.   All other systems are reviewed and negative.   Physical Exam: VS:  BP 98/60   Pulse 69   Ht 6\' 1"  (1.854 m)   Wt 179 lb (81.2 kg)   SpO2 97% Comment: at rest  BMI 23.62 kg/m  .  BMI Body mass index is 23.62 kg/m.  Wt Readings from Last 3 Encounters:  06/18/16 179 lb (81.2 kg)  05/15/16 206 lb 3.2 oz (93.5 kg)  05/14/16 205 lb 9.6 oz (93.3 kg)    General: Elderly male who is frail, but alert and in no acute distress. ?if his weight is accurate.   HEENT: Normal.  Neck: Supple, no JVD, carotid bruits, or masses noted.  Cardiac: Regular rate and rhythm. No murmurs, rubs, or gallops. No edema.  Respiratory:  Lungs are clear to auscultation bilaterally with normal work of breathing.  GI: Soft and nontender.  MS: No deformity or atrophy. Gait is slow. ROM intact.  Skin: Warm and dry. Color is normal.  Neuro:  Strength and sensation are intact and no gross focal deficits noted.  Psych: Alert, appropriate and with normal affect.   LABORATORY DATA:  EKG:  EKG is not ordered today.  Lab Results  Component Value Date   WBC 4.7 05/14/2016   HGB 10.3 (L)  05/14/2016   HCT 33.7 (L) 05/14/2016   PLT 170 05/14/2016   GLUCOSE 82 05/14/2016   CHOL 162 10/16/2012   TRIG 110 10/16/2012   HDL 39 (L) 10/16/2012   LDLCALC 101 (H) 10/16/2012   ALT 10 05/14/2016   AST 13 05/14/2016   NA 139 05/14/2016   K 5.2 05/14/2016   CL 110 05/14/2016   CREATININE 2.95 (H) 05/14/2016   BUN 39 (H) 05/14/2016   CO2 22 05/14/2016   TSH 1.311 05/12/2015  INR 1.22 05/12/2015   HGBA1C 5.6 05/12/2015    BNP (last 3 results) No results for input(s): BNP in the last 8760 hours.  ProBNP (last 3 results) No results for input(s): PROBNP in the last 8760 hours.   Other Studies Reviewed Today:  Echo Study Conclusions from 08/2014  - Left ventricle: The cavity size was normal. Wall thickness was increased in a pattern of moderate LVH. Systolic function was moderately to severely reduced. The estimated ejection fraction was in the range of 30% to 35%. There is akinesis of the mid-apicalinferior and inferoseptal myocardium. Doppler parameters are consistent with abnormal left ventricular relaxation (grade 1 diastolic dysfunction). - Aortic valve: There was mild regurgitation. - Left atrium: The atrium was mildly dilated. - Right ventricle: The cavity size was mildly dilated. Wall thickness was normal. - Pulmonary arteries: Systolic pressure was mildly increased. PA peak pressure: 34 mm Hg (S).  Impressions:  - New inferior wall motion abnormality and decreased EF when compared to prior echocardiogram    Notes Recorded by Sinclair Grooms, MD on 08/30/2014 at 7:16 PM regarding Myoview  Needs to know that the scan showed evidence of a heart attack. Not sure when. Heart is weak. Start Imdur 60 mg daily. Start Carvedilol 3.125 mg BID. OV with me in 7 days.     Assessment/Plan:  1. Ischemic CM - managed medically - his Imdur cut back a month ago - seems to be holding his own for now.   2. HTN - BP running low - Imdur cut back at  last visit - BP higher for Dr. Lovena Le. He is asymptomatic in my opinion. Family knows to monitor for dizziness. I would not want symptoms of chest pain and worsening dyspnea to recur if we cut medicines back further at this time - therefore, I have left him on his current regimen.   3. Underlying PPM - followed by Dr. Lovena Le  4. Chronic systolic HF - doubt the weight is accurate - daughter feels if anything he has gained some. Will follow. Reweighed on the way out - it was 199.   5. CAD - managed medically - no chest pain.   Current medicines are reviewed with the patient today.  The patient does not have concerns regarding medicines other than what has been noted above.  The following changes have been made:  See above.  Labs/ tests ordered today include:   No orders of the defined types were placed in this encounter.    Disposition:   FU with Dr. Tamala Julian in about 3 to 4 months. Family to call for any concerns.    Patient is agreeable to this plan and will call if any problems develop in the interim.   Signed: Burtis Junes, RN, ANP-C 06/18/2016 10:57 AM  [facility] [address] [place], [place]  [address] Phone: [phone] Fax: [phone]

## 2016-06-23 ENCOUNTER — Other Ambulatory Visit: Payer: Self-pay | Admitting: Interventional Cardiology

## 2016-06-23 DIAGNOSIS — I5022 Chronic systolic (congestive) heart failure: Secondary | ICD-10-CM

## 2016-07-19 ENCOUNTER — Other Ambulatory Visit: Payer: Self-pay | Admitting: Interventional Cardiology

## 2016-08-14 ENCOUNTER — Ambulatory Visit (INDEPENDENT_AMBULATORY_CARE_PROVIDER_SITE_OTHER): Payer: Medicare Other | Admitting: *Deleted

## 2016-08-14 DIAGNOSIS — I495 Sick sinus syndrome: Secondary | ICD-10-CM | POA: Diagnosis not present

## 2016-08-14 NOTE — Progress Notes (Signed)
Remote pacemaker transmission.   

## 2016-08-17 ENCOUNTER — Other Ambulatory Visit: Payer: Self-pay | Admitting: Internal Medicine

## 2016-08-21 LAB — CUP PACEART REMOTE DEVICE CHECK
Brady Statistic RA Percent Paced: 80 %
Brady Statistic RV Percent Paced: 2 %
Implantable Lead Implant Date: 20161016
Implantable Lead Location: 753859
Implantable Lead Serial Number: 49296158
Implantable Pulse Generator Implant Date: 20161016
Lead Channel Impedance Value: 527 Ohm
Lead Channel Sensing Intrinsic Amplitude: 1.2 mV
MDC IDC LEAD IMPLANT DT: 20161016
MDC IDC LEAD LOCATION: 753860
MDC IDC LEAD SERIAL: 49267833
MDC IDC MSMT LEADCHNL RA PACING THRESHOLD AMPLITUDE: 1 V
MDC IDC MSMT LEADCHNL RA PACING THRESHOLD PULSEWIDTH: 0.4 ms
MDC IDC MSMT LEADCHNL RV IMPEDANCE VALUE: 449 Ohm
MDC IDC MSMT LEADCHNL RV PACING THRESHOLD AMPLITUDE: 1.3 V
MDC IDC MSMT LEADCHNL RV PACING THRESHOLD PULSEWIDTH: 0.4 ms
MDC IDC MSMT LEADCHNL RV SENSING INTR AMPL: 4.1 mV
MDC IDC PG SERIAL: 68596848
MDC IDC SESS DTM: 20180123145435
Pulse Gen Model: 394969

## 2016-08-22 ENCOUNTER — Encounter: Payer: Self-pay | Admitting: Cardiology

## 2016-09-06 ENCOUNTER — Ambulatory Visit: Payer: 59 | Admitting: Podiatry

## 2016-09-10 ENCOUNTER — Ambulatory Visit (INDEPENDENT_AMBULATORY_CARE_PROVIDER_SITE_OTHER): Payer: Medicare Other | Admitting: Podiatry

## 2016-09-10 ENCOUNTER — Encounter: Payer: Self-pay | Admitting: Podiatry

## 2016-09-10 DIAGNOSIS — M79676 Pain in unspecified toe(s): Secondary | ICD-10-CM | POA: Diagnosis not present

## 2016-09-10 DIAGNOSIS — M79604 Pain in right leg: Secondary | ICD-10-CM

## 2016-09-10 DIAGNOSIS — B351 Tinea unguium: Secondary | ICD-10-CM | POA: Diagnosis not present

## 2016-09-10 DIAGNOSIS — M79605 Pain in left leg: Secondary | ICD-10-CM

## 2016-09-12 NOTE — Progress Notes (Signed)
Subjective:     Patient ID: Bradley [name], male   DOB: [birth year], 83 y.o.   MRN: [id number]  HPI patient presents with nail disease 1-5 both feet that are thick and painful   Review of Systems     Objective:   Physical Exam Neurovascular status intact with thick yellow brittle nailbeds 1-5 both feet that are painful    Assessment:     Mycotic nail infections with pain 1-5 both feet    Plan:     Debris painful nailbeds 1-5 both feet with no iatrogenic bleeding noted

## 2016-09-21 ENCOUNTER — Ambulatory Visit (INDEPENDENT_AMBULATORY_CARE_PROVIDER_SITE_OTHER): Payer: Medicare Other | Admitting: Interventional Cardiology

## 2016-09-21 ENCOUNTER — Encounter: Payer: Self-pay | Admitting: Interventional Cardiology

## 2016-09-21 VITALS — BP 110/62 | HR 70 | Ht 73.0 in | Wt 200.1 lb

## 2016-09-21 DIAGNOSIS — I5022 Chronic systolic (congestive) heart failure: Secondary | ICD-10-CM

## 2016-09-21 DIAGNOSIS — R55 Syncope and collapse: Secondary | ICD-10-CM

## 2016-09-21 DIAGNOSIS — I10 Essential (primary) hypertension: Secondary | ICD-10-CM | POA: Diagnosis not present

## 2016-09-21 DIAGNOSIS — Z950 Presence of cardiac pacemaker: Secondary | ICD-10-CM

## 2016-09-21 DIAGNOSIS — I2510 Atherosclerotic heart disease of native coronary artery without angina pectoris: Secondary | ICD-10-CM | POA: Diagnosis not present

## 2016-09-21 LAB — BASIC METABOLIC PANEL
BUN/Creatinine Ratio: 16 (ref 10–24)
BUN: 45 mg/dL — AB (ref 8–27)
CALCIUM: 8.8 mg/dL (ref 8.6–10.2)
CO2: 17 mmol/L — ABNORMAL LOW (ref 18–29)
Chloride: 106 mmol/L (ref 96–106)
Creatinine, Ser: 2.9 mg/dL — ABNORMAL HIGH (ref 0.76–1.27)
GFR, EST AFRICAN AMERICAN: 22 mL/min/{1.73_m2} — AB (ref >59)
GFR, EST NON AFRICAN AMERICAN: 19 mL/min/{1.73_m2} — AB (ref >59)
Glucose: 80 mg/dL (ref 65–99)
POTASSIUM: 5.1 mmol/L (ref 3.5–5.2)
Sodium: 139 mmol/L (ref 134–144)

## 2016-09-21 LAB — PRO B NATRIURETIC PEPTIDE: NT-Pro BNP: 2828 pg/mL — ABNORMAL HIGH (ref 0–486)

## 2016-09-21 NOTE — Progress Notes (Signed)
Cardiology Office Note    Date:  09/21/2016   ID:  JENCARLOS NICOLSON, DOB Aug 09, 1927, MRN [id number]  PCP:  Henrine Screws, MD  Cardiologist: Sinclair Grooms, MD   Chief Complaint  Patient presents with  . Congestive Heart Failure  . Coronary Artery Disease    History of Present Illness:  Bradley [name] is a 83 y.o. male who presents for systolic heart failure, coronary artery disease denoted by high-grade myocardial perfusion study with decision for medical therapy, chronic kidney disease, hypertension, and history of recurrent neurally mediated syncope. DDD pacemaker has been placed.   Dyspnea on exertion. Unchanged since last office visit. No intercurrent episodes of fainting. He denies chest discomfort. No significant orthopnea. Ambulation is low and with care to avoid falls. His family does not allow him to drive and we have recommended such.    Past Medical History:  Diagnosis Date  . Arthritis   . BPH (benign prostatic hypertrophy)   . Cardiomyopathy ([place])    a. thought to be ischemic with high risk MPS EF 23%. Reluctant to do cath due to CKD  . Cataract   . Chronic kidney disease   . Dysrhythmia   . GERD (gastroesophageal reflux disease)   . Hypertension   . PPD positive    a. HX POSITIVE PPD WITH NEGATIVE CXR  . Status post placement of cardiac pacemaker    a. 04/2015: bradycardic arrest s/p Biotronik serial #[id number] pacemaker.  . Strabismus    a. right eye  . Symptomatic bradycardia    a. s/p Biotronik serial [id number] pacemaker.  . Syncope and collapse    a. in 2014 and again in 08/2014. Thought to be vasovagal   . Varicosities of leg     Past Surgical History:  Procedure Laterality Date  . CATARACT EXTRACTION W/ INTRAOCULAR LENS IMPLANT     BOTH EYES  . EP IMPLANTABLE DEVICE N/A 05/13/2015   Procedure: Pacemaker Implant;  Surgeon: Evans Lance, MD;  Location: [facility];  Service: Cardiovascular;  Laterality: N/A;  . TONSILLECTOMY      "I was young" (10/15/2012)  . TOTAL KNEE ARTHROPLASTY Left 12/01/2012   Procedure: LEFT TOTAL KNEE ARTHROPLASTY;  Surgeon: [name], MD;  Location: [facility];  Service: Orthopedics;  Laterality: Left;  . TRANSURETHRAL RESECTION OF PROSTATE  2006    Current Medications: Outpatient Medications Prior to Visit  Medication Sig Dispense Refill  . aspirin EC 81 MG tablet Take 1 tablet (81 mg total) by mouth daily.    . carvedilol (COREG) 6.25 MG tablet Take 1 tablet (6.25 mg total) by mouth 2 (two) times daily. 60 tablet 11  . cholecalciferol (VITAMIN D) 1000 UNITS tablet Take 1,000 Units by mouth 2 (two) times daily.    Marland Kitchen docusate sodium (COLACE) 100 MG capsule Take 100 mg by mouth 2 (two) times daily as needed for mild constipation.    . dorzolamide-timolol (COSOPT) 22.3-6.8 MG/ML ophthalmic solution Place 1 drop into both eyes 2 (two) times daily.     . furosemide (LASIX) 40 MG tablet TAKE 1 TABLET BY MOUTH EVERY DAY 30 tablet 11  . hydrALAZINE (APRESOLINE) 25 MG tablet TAKE 1 TABLET (25 MG TOTAL) BY MOUTH 2 (TWO) TIMES DAILY. 60 tablet 5  . latanoprost (XALATAN) 0.005 % ophthalmic solution Place 1 drop into both eyes at bedtime.    . Multiple Vitamin (MULTIVITAMIN WITH MINERALS) TABS tablet Take 1 tablet by mouth daily.    Marland Kitchen omeprazole (  PRILOSEC) 20 MG capsule Take 40 mg by mouth daily.  9  . isosorbide mononitrate (IMDUR) 60 MG 24 hr tablet Take 1 tablet (60 mg total) by mouth daily. 30 tablet 6  . NITROSTAT 0.4 MG SL tablet PLACE 1 TABLET (0.4 MG TOTAL) UNDER THE TONGUE EVERY 5 (FIVE) MINUTES AS NEEDED. (Patient not taking: Reported on 09/21/2016) 25 tablet 0   No facility-administered medications prior to visit.      Allergies:   Patient has no known allergies.   Social History   Social History  . Marital status: Married    Spouse name: N/A  . Number of children: N/A  . Years of education: N/A   Occupational History  . Retired    Social History Main Topics  . Smoking  status: Never Smoker  . Smokeless tobacco: Never Used  . Alcohol use No  . Drug use: No  . Sexual activity: No   Other Topics Concern  . None   Social History Narrative   Lives with his wife.     Family History:  The patient's family history includes Heart failure in his mother; Hypertension in his brother and mother; Pneumonia in his father.   ROS:   Please see the history of present illness.    Concerned about movement of his pacing device . Difficulty with ambulating due to balance difficulty and leg weakness. Wall use his cane. He feels depressed. All other systems reviewed and are negative.   PHYSICAL EXAM:   VS:  BP 110/62 (BP Location: Left Arm)   Pulse 70   Ht 6\' 1"  (1.854 m)   Wt 200 lb 1.9 oz (90.8 kg)   BMI 26.40 kg/m    GEN: Well nourished, well developed, in no acute distress  HEENT: normal  Neck: no JVD, carotid bruits, or masses Cardiac: RRR; no murmurs, rubs, or gallops. There is trace edema in both legs. Pacemaker device palpated and skin inspected. No obvious issues. His skin is thin and the device is easily palpable but no evidence of skin breakdown. Respiratory:  clear to auscultation bilaterally, normal work of breathing GI: soft, nontender, nondistended, + BS MS: no deformity or atrophy  Skin: warm and dry, no rash Neuro:  Alert and Oriented x 3, Strength and sensation are intact Psych: euthymic mood, full affect  Wt Readings from Last 3 Encounters:  09/21/16 200 lb 1.9 oz (90.8 kg)  06/18/16 179 lb (81.2 kg)  05/15/16 206 lb 3.2 oz (93.5 kg)      Studies/Labs Reviewed:   EKG:  EKG  Normal sinus rhythm, atrial pacing, interventricular conduction delay, nonspecific. Possible prior inferior Q-wave infarction.  Recent Labs: 05/14/2016: ALT 10; BUN 39; Creat 2.95; Hemoglobin 10.3; Platelets 170; Potassium 5.2; Sodium 139   Lipid Panel    Component Value Date/Time   CHOL 162 10/16/2012 0510   TRIG 110 10/16/2012 0510   HDL 39 (L) 10/16/2012  0510   CHOLHDL 4.2 10/16/2012 0510   VLDL 22 10/16/2012 0510   LDLCALC 101 (H) 10/16/2012 0510    Additional studies/ records that were reviewed today include:  No new data.    ASSESSMENT:    1. CAD in native artery   2. Chronic systolic HF (heart failure) ([place])   3. Essential hypertension   4. Syncope and collapse   5. Status post placement of cardiac pacemaker      PLAN:  In order of problems listed above:  1. Stable without anginal complaints 2. Chronic and severe.  We will repeat a BNP and basic metabolic panel today. Clinical follow-up in 6-9 months. 3. Adequate blood pressure control. We have had difficulty with recurrent syncope and to aggressive with beta blocker, Imdur, and hydralazine intensity. 4. No recent episodes. 5. Normal function based upon today's EKG. No evidence of device skin erosion.    Medication Adjustments/Labs and Tests Ordered: Current medicines are reviewed at length with the patient today.  Concerns regarding medicines are outlined above.  Medication changes, Labs and Tests ordered today are listed in the Patient Instructions below. There are no Patient Instructions on file for this visit.   Signed, Sinclair Grooms, MD  09/21/2016 10:25 AM    [facility] [place], [place], [place]  [address] Phone: [phone]; Fax: [phone]

## 2016-09-21 NOTE — Patient Instructions (Signed)
Medication Instructions:  None  Labwork: BMET and BNP today  Testing/Procedures: None  Follow-Up: Your physician wants you to follow-up in: 6-9 months with Dr. Tamala Julian. You will receive a reminder letter in the mail two months in advance. If you don't receive a letter, please call our office to schedule the follow-up appointment.   Any Other Special Instructions Will Be Listed Below (If Applicable).  Please contact your Primary Care Physician in regards to getting a more durable piece of equipment (walker) to assist with walking.   If you need a refill on your cardiac medications before your next appointment, please call your pharmacy.

## 2016-09-25 ENCOUNTER — Other Ambulatory Visit: Payer: Self-pay | Admitting: Internal Medicine

## 2016-09-25 DIAGNOSIS — N1330 Unspecified hydronephrosis: Secondary | ICD-10-CM

## 2016-09-26 ENCOUNTER — Telehealth: Payer: Self-pay | Admitting: Interventional Cardiology

## 2016-09-26 DIAGNOSIS — I5022 Chronic systolic (congestive) heart failure: Secondary | ICD-10-CM

## 2016-09-26 MED ORDER — FUROSEMIDE 40 MG PO TABS: 60.0000 mg | ORAL_TABLET | Freq: Every day | ORAL | 3 refills | Status: DC

## 2016-09-26 NOTE — Telephone Encounter (Signed)
Spoke with daughter, DPR on file.  Reviewed lab results and recommendations per Dr. Tamala Julian.  Will have labs repeated on 10/11/16.  Daughter verbalized understanding and was in agreement with this plan.

## 2016-09-26 NOTE — Telephone Encounter (Signed)
Follow Up    Pt daughter returning phone call from nurse. She thinks it may be regarding labs

## 2016-09-27 ENCOUNTER — Ambulatory Visit
Admission: RE | Admit: 2016-09-27 | Discharge: 2016-09-27 | Disposition: A | Discharge status: Home / Self Care | Payer: Medicare Other | Source: Ambulatory Visit | Attending: Internal Medicine | Admitting: Internal Medicine

## 2016-09-27 DIAGNOSIS — N1330 Unspecified hydronephrosis: Secondary | ICD-10-CM

## 2016-10-11 ENCOUNTER — Other Ambulatory Visit: Payer: Medicare Other | Admitting: *Deleted

## 2016-10-11 DIAGNOSIS — I5022 Chronic systolic (congestive) heart failure: Secondary | ICD-10-CM

## 2016-10-12 LAB — BASIC METABOLIC PANEL
BUN/Creatinine Ratio: 20 (ref 10–24)
BUN: 65 mg/dL — AB (ref 8–27)
CO2: 20 mmol/L (ref 18–29)
CREATININE: 3.29 mg/dL — AB (ref 0.76–1.27)
Calcium: 8.6 mg/dL (ref 8.6–10.2)
Chloride: 100 mmol/L (ref 96–106)
GFR calc Af Amer: 19 mL/min/{1.73_m2} — ABNORMAL LOW (ref >59)
GFR calc non Af Amer: 16 mL/min/{1.73_m2} — ABNORMAL LOW (ref >59)
GLUCOSE: 86 mg/dL (ref 65–99)
Potassium: 4.8 mmol/L (ref 3.5–5.2)
SODIUM: 140 mmol/L (ref 134–144)

## 2016-10-17 ENCOUNTER — Telehealth: Payer: Self-pay | Admitting: Interventional Cardiology

## 2016-10-17 DIAGNOSIS — I5022 Chronic systolic (congestive) heart failure: Secondary | ICD-10-CM

## 2016-10-17 NOTE — Telephone Encounter (Signed)
New Message     Pt daughter returning your call about blood work

## 2016-10-17 NOTE — Telephone Encounter (Signed)
Left message to call back  

## 2016-10-18 NOTE — Telephone Encounter (Signed)
Spoke with daughter, [name]. DPR on file. Made her aware of lab results. She states pt's breathing is not any better. Advised her to decrease Furosemide to 40mg  QD. Repeat labs scheduled for 12/19/16. Daughter verbalized understanding and was in agreement with this plan

## 2016-11-13 ENCOUNTER — Ambulatory Visit (INDEPENDENT_AMBULATORY_CARE_PROVIDER_SITE_OTHER): Payer: Medicare Other | Admitting: *Deleted

## 2016-11-13 DIAGNOSIS — R55 Syncope and collapse: Secondary | ICD-10-CM | POA: Diagnosis not present

## 2016-11-13 NOTE — Progress Notes (Signed)
Remote pacemaker transmission.   

## 2016-11-15 LAB — CUP PACEART REMOTE DEVICE CHECK
Brady Statistic RA Percent Paced: 78 %
Brady Statistic RV Percent Paced: 3 %
Date Time Interrogation Session: 20180419100758
Implantable Lead Implant Date: 20161016
Implantable Lead Location: 753859
Implantable Lead Model: 377
Implantable Lead Serial Number: 49267833
Implantable Pulse Generator Implant Date: 20161016
Lead Channel Impedance Value: 468 Ohm
Lead Channel Pacing Threshold Amplitude: 0.9 V
Lead Channel Pacing Threshold Amplitude: 1.6 V
Lead Channel Sensing Intrinsic Amplitude: 3.2 mV
Lead Channel Sensing Intrinsic Amplitude: 5.9 mV
MDC IDC LEAD IMPLANT DT: 20161016
MDC IDC LEAD LOCATION: 753860
MDC IDC LEAD SERIAL: 49296158
MDC IDC MSMT LEADCHNL RA IMPEDANCE VALUE: 507 Ohm
MDC IDC MSMT LEADCHNL RA PACING THRESHOLD PULSEWIDTH: 0.4 ms
MDC IDC MSMT LEADCHNL RV PACING THRESHOLD PULSEWIDTH: 0.4 ms
Pulse Gen Serial Number: 68596848

## 2016-11-16 ENCOUNTER — Encounter: Payer: Self-pay | Admitting: Cardiology

## 2016-12-10 ENCOUNTER — Ambulatory Visit (INDEPENDENT_AMBULATORY_CARE_PROVIDER_SITE_OTHER): Payer: 59 | Admitting: Podiatry

## 2016-12-10 DIAGNOSIS — M79676 Pain in unspecified toe(s): Secondary | ICD-10-CM

## 2016-12-10 DIAGNOSIS — M79604 Pain in right leg: Secondary | ICD-10-CM

## 2016-12-10 DIAGNOSIS — B351 Tinea unguium: Secondary | ICD-10-CM | POA: Diagnosis not present

## 2016-12-10 DIAGNOSIS — M79605 Pain in left leg: Secondary | ICD-10-CM

## 2016-12-10 NOTE — Progress Notes (Signed)
   SUBJECTIVE Patient  presents to office today complaining of elongated, thickened nails. Pain while ambulating in shoes. Patient is unable to trim their own nails.   OBJECTIVE General Patient is awake, alert, and oriented x 3 and in no acute distress. Derm Skin is dry and supple bilateral. Negative open lesions or macerations. Remaining integument unremarkable. Nails are tender, long, thickened and dystrophic with subungual debris, consistent with onychomycosis, 1-5 bilateral. No signs of infection noted. Vasc  DP and PT pedal pulses palpable bilaterally. Temperature gradient within normal limits.  Neuro Epicritic and protective threshold sensation diminished bilaterally.  Musculoskeletal Exam No symptomatic pedal deformities noted bilateral. Muscular strength within normal limits.  ASSESSMENT 1. Onychodystrophic nails 1-5 bilateral with hyperkeratosis of nails.  2. Onychomycosis of nail due to dermatophyte bilateral 3. Pain in foot bilateral  PLAN OF CARE 1. Patient evaluated today.  2. Instructed to maintain good pedal hygiene and foot care.  3. Mechanical debridement of nails 1-5 bilaterally performed using a nail nipper. Filed with dremel without incident.  4. Return to clinic in 3 mos.    Brent M. Evans, DPM [facility]  Dr. Brent M. Evans, DPM    [address]                                        [place], [place] [address]                Office [phone]  Fax [phone]      

## 2016-12-19 ENCOUNTER — Other Ambulatory Visit: Payer: Medicare Other

## 2016-12-25 ENCOUNTER — Other Ambulatory Visit: Payer: Medicare Other

## 2016-12-25 DIAGNOSIS — I5022 Chronic systolic (congestive) heart failure: Secondary | ICD-10-CM

## 2016-12-26 LAB — BASIC METABOLIC PANEL
BUN / CREAT RATIO: 15 (ref 10–24)
BUN: 46 mg/dL — ABNORMAL HIGH (ref 8–27)
CHLORIDE: 106 mmol/L (ref 96–106)
CO2: 20 mmol/L (ref 18–29)
Calcium: 8.7 mg/dL (ref 8.6–10.2)
Creatinine, Ser: 3.12 mg/dL — ABNORMAL HIGH (ref 0.76–1.27)
GFR calc non Af Amer: 17 mL/min/{1.73_m2} — ABNORMAL LOW (ref >59)
GFR, EST AFRICAN AMERICAN: 20 mL/min/{1.73_m2} — AB (ref >59)
GLUCOSE: 103 mg/dL — AB (ref 65–99)
POTASSIUM: 4.6 mmol/L (ref 3.5–5.2)
SODIUM: 142 mmol/L (ref 134–144)

## 2017-01-14 ENCOUNTER — Ambulatory Visit (INDEPENDENT_AMBULATORY_CARE_PROVIDER_SITE_OTHER): Payer: Medicare Other | Admitting: *Deleted

## 2017-01-14 DIAGNOSIS — I255 Ischemic cardiomyopathy: Secondary | ICD-10-CM

## 2017-01-14 NOTE — Progress Notes (Signed)
Patient seen today in clinic for add on d/t c/o that device was "sticking out of chest". Upon assessment incision in closed, free of redness swelling or drainage. No evidence of device externalization. I was able to palpate device which patient's daughter reports as concerning. I explained that this is normal. They verbalized understanding.

## 2017-01-17 ENCOUNTER — Ambulatory Visit (INDEPENDENT_AMBULATORY_CARE_PROVIDER_SITE_OTHER): Payer: Medicare Other | Admitting: Physician Assistant

## 2017-01-17 ENCOUNTER — Encounter: Payer: Self-pay | Admitting: Physician Assistant

## 2017-01-17 ENCOUNTER — Encounter (INDEPENDENT_AMBULATORY_CARE_PROVIDER_SITE_OTHER): Payer: Self-pay

## 2017-01-17 VITALS — BP 112/72 | HR 66 | Ht 73.0 in | Wt 184.4 lb

## 2017-01-17 DIAGNOSIS — I2510 Atherosclerotic heart disease of native coronary artery without angina pectoris: Secondary | ICD-10-CM | POA: Diagnosis not present

## 2017-01-17 DIAGNOSIS — Z950 Presence of cardiac pacemaker: Secondary | ICD-10-CM

## 2017-01-17 DIAGNOSIS — I5023 Acute on chronic systolic (congestive) heart failure: Secondary | ICD-10-CM | POA: Diagnosis not present

## 2017-01-17 DIAGNOSIS — I10 Essential (primary) hypertension: Secondary | ICD-10-CM

## 2017-01-17 NOTE — Progress Notes (Signed)
Cardiology Office Note    Date:  01/17/2017   ID:  Bradley [name], DOB July 08, 1928, MRN [id number]  PCP:  [name], MD  Cardiologist: Dr. Daneen Schick EPS Dr. Lovena Le  Chief Complaint  Patient presents with  . Follow-up    Fluid and SOB    History of Present Illness:  Bradley [name] is a 83 y.o. male history of systolic CHF LVEF 83%, CAD with risk myocardial perfusion study but decision for medical therapy secondary to. 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 Last saw Dr. Tamala Julian 08/2016.  Patient comes in today accompanied by his daughter. She noticed his legs began to swell on Saturday. He is complaining of a cough and has chronic dyspnea on exertion that he doesn't think has worsened. He admits to adding salt to his foods. His daughter was unaware of this. He also feels like he is not urinating as strongly as he had been in the past. His weight is down 16 pounds since seen in February. His daughter says he eats regularly but isn't eating as much.   Past Medical History:  Diagnosis Date  . Arthritis   . BPH (benign prostatic hypertrophy)   . Cardiomyopathy ([place])    a. thought to be ischemic with high risk MPS EF 23%. Reluctant to do cath due to CKD  . Cataract   . Chronic kidney disease   . Dysrhythmia   . GERD (gastroesophageal reflux disease)   . Hypertension   . PPD positive    a. HX POSITIVE PPD WITH NEGATIVE CXR  . Status post placement of cardiac pacemaker    a. 04/2015: bradycardic arrest s/p Biotronik serial #[id number] pacemaker.  . Strabismus    a. right eye  . Symptomatic bradycardia    a. s/p Biotronik serial [id number] pacemaker.  . Syncope and collapse    a. in 2014 and again in 08/2014. Thought to be vasovagal   . Varicosities of leg     Past Surgical History:  Procedure Laterality Date  . CATARACT EXTRACTION W/ INTRAOCULAR LENS IMPLANT     BOTH EYES  . EP IMPLANTABLE DEVICE N/A 05/13/2015   Procedure: Pacemaker Implant;  Surgeon: Evans Lance, MD;  Location: [facility];  Service: Cardiovascular;  Laterality: N/A;  . TONSILLECTOMY     "I was young" (10/15/2012)  . TOTAL KNEE ARTHROPLASTY Left 12/01/2012   Procedure: LEFT TOTAL KNEE ARTHROPLASTY;  Surgeon: [name], MD;  Location: [facility];  Service: Orthopedics;  Laterality: Left;  . TRANSURETHRAL RESECTION OF PROSTATE  2006    Current Medications: Current Meds  Medication Sig  . aspirin EC 81 MG tablet Take 1 tablet (81 mg total) by mouth daily.  . carvedilol (COREG) 6.25 MG tablet Take 1 tablet (6.25 mg total) by mouth 2 (two) times daily.  . cholecalciferol (VITAMIN D) 1000 UNITS tablet Take 1,000 Units by mouth 2 (two) times daily.  Marland Kitchen docusate sodium (COLACE) 100 MG capsule Take 100 mg by mouth 2 (two) times daily as needed for mild constipation.  . dorzolamide-timolol (COSOPT) 22.3-6.8 MG/ML ophthalmic solution Place 1 drop into both eyes 2 (two) times daily.   . hydrALAZINE (APRESOLINE) 25 MG tablet TAKE 1 TABLET (25 MG TOTAL) BY MOUTH 2 (TWO) TIMES DAILY.  . isosorbide mononitrate (IMDUR) 60 MG 24 hr tablet Take 60 mg by mouth daily.  Marland Kitchen latanoprost (XALATAN) 0.005 % ophthalmic solution Place 1 drop into both eyes at bedtime.  Marland Kitchen  Multiple Vitamin (MULTIVITAMIN WITH MINERALS) TABS tablet Take 1 tablet by mouth daily.  . nitroGLYCERIN (NITROSTAT) 0.4 MG SL tablet Place 0.4 mg under the tongue every 5 (five) minutes as needed for chest pain (max 3 doses within 15 minutes call 911).  Marland Kitchen omeprazole (PRILOSEC) 20 MG capsule Take 40 mg by mouth daily.     Allergies:   Patient has no known allergies.   Social History   Social History  . Marital status: Married    Spouse name: N/A  . Number of children: N/A  . Years of education: N/A   Occupational History  . Retired    Social History Main Topics  . Smoking status: Never Smoker  . Smokeless tobacco: Never Used  . Alcohol use No  . Drug use: No  . Sexual  activity: No   Other Topics Concern  . None   Social History Narrative   Lives with his wife.     Family History:  The patient's family history includes Heart failure in his mother; Hypertension in his brother and mother; Pneumonia in his father.   ROS:   Please see the history of present illness.    Review of Systems  Constitution: Negative.  HENT: Negative.   Cardiovascular: Negative.   Respiratory: Positive for cough, shortness of breath and snoring.   Endocrine: Negative.   Hematologic/Lymphatic: Negative.   Musculoskeletal: Positive for joint swelling and myalgias.  Gastrointestinal: Negative.   Genitourinary: Negative.   Neurological: Positive for loss of balance.   All other systems reviewed and are negative.   PHYSICAL EXAM:   VS:  BP 112/72   Pulse 66   Ht 6\' 1"  (1.854 m)   Wt 184 lb 6.4 oz (83.6 kg)   BMI 24.33 kg/m   Physical Exam  GEN: Well nourished, well developed, in no acute distress  Neck: no JVD, carotid bruits, or masses Cardiac:RRR; 2/6 systolic murmur at the left sternal border  Respiratory:  clear to auscultation bilaterally, normal work of breathing GI: soft, nontender, nondistended, + BS Ext: 1-2+ ankle edema bilaterally Good distal pulses bilaterally Neuro:  Alert and Oriented x 3 Psych: euthymic mood, full affect  Wt Readings from Last 3 Encounters:  01/17/17 184 lb 6.4 oz (83.6 kg)  09/21/16 200 lb 1.9 oz (90.8 kg)  06/18/16 179 lb (81.2 kg)      Studies/Labs Reviewed:   EKG:  EKG is Not ordered today.    Recent Labs: 05/14/2016: ALT 10; Hemoglobin 10.3; Platelets 170 09/21/2016: NT-Pro BNP 2,828 12/25/2016: BUN 46; Creatinine, Ser 3.12; Potassium 4.6; Sodium 142   Lipid Panel    Component Value Date/Time   CHOL 162 10/16/2012 0510   TRIG 110 10/16/2012 0510   HDL 39 (L) 10/16/2012 0510   CHOLHDL 4.2 10/16/2012 0510   VLDL 22 10/16/2012 0510   LDLCALC 101 (H) 10/16/2012 0510    Additional studies/ records that were  reviewed today include:  2-D echo 2/25/16Study Conclusions  - Left ventricle: The cavity size was normal. Wall thickness was   increased in a pattern of moderate LVH. Systolic function was   moderately to severely reduced. The estimated ejection fraction   was in the range of 30% to 35%. There is akinesis of the   mid-apicalinferior and inferoseptal myocardium. Doppler   parameters are consistent with abnormal left ventricular   relaxation (grade 1 diastolic dysfunction). - Aortic valve: There was mild regurgitation. - Left atrium: The atrium was mildly dilated. - Right ventricle: The cavity  size was mildly dilated. Wall   thickness was normal. - Pulmonary arteries: Systolic pressure was mildly increased. PA   peak pressure: 34 mm Hg (S).  Impressions:  - New inferior wall motion abnormality and decreased EF when   compared to prior echocardiogram. Nuclear stress test 08/27/14 Overall Impression:  High risk stress nuclear study with a large scar in the entire inferior, basal and mid inferolateral wall with no ischemia. .   LV Ejection Fraction: 23%.  LV Wall Motion:  Severely decreased LVEF with diffuse hypokinesis and akinesis of the entire inferior and inferolateral walls.     Dorothy Spark 08/27/2014    ASSESSMENT:    1. Acute on chronic systolic CHF (congestive heart failure) ([place])   2. CAD in native artery   3. Essential hypertension   4. Status post placement of cardiac pacemaker      PLAN:  In order of problems listed above:  Acute on chronic systolic CHF LVEF 19-62% on echo 2016 I suspect is secondary to excessive sodium in his diet. Will increase Lasix to 80 mg daily for 3 days then back to 60 mg daily. Recent creatinine was stable at 3.12. Will not repeat today. 2 g sodium diet discussed. Follow-up with Dr. Tamala Julian in 2-3 months. Call if swelling does not improve and we'll see him back sooner.  CAD without angina. done because of CKD  Essential hypertension  well controlled  Status post pacemaker followed closely by Dr. Lovena Le  Medication Adjustments/Labs and Tests Ordered: Current medicines are reviewed at length with the patient today.  Concerns regarding medicines are outlined above.  Medication changes, Labs and Tests ordered today are listed in the Patient Instructions below. Patient Instructions  Medication Instructions:    FOR 3 DAYS ONLY  TAKE 80 MG  (TWO 40 MG TABLETS)    THEN TAKE ONE  40 MG TABLET DAILY   If you need a refill on your cardiac medications before your next appointment, please call your pharmacy.  Labwork: NONE ORDERED  TODAY   Testing/Procedures: NONE ORDERED  TODAY    Follow-Up:  IN 2 TO 3 MONTHS WITH DR Tamala Julian    Any Other Special Instructions Will Be Listed Below (If Applicable).                               Low-Sodium Eating Plan Sodium, which is an element that makes up salt, helps you maintain a healthy balance of fluids in your body. Too much sodium can increase your blood pressure and cause fluid and waste to be held in your body. Your health care provider or dietitian may recommend following this plan if you have high blood pressure (hypertension), kidney disease, liver disease, or heart failure. Eating less sodium can help lower your blood pressure, reduce swelling, and protect your heart, liver, and kidneys. What are tips for following this plan? General guidelines  Most people on this plan should limit their sodium intake to 1,500-2,000 mg (milligrams) of sodium each day. Reading food labels  The Nutrition Facts label lists the amount of sodium in one serving of the food. If you eat more than one serving, you must multiply the listed amount of sodium by the number of servings.  Choose foods with less than 140 mg of sodium per serving.  Avoid foods with 300 mg of sodium or more per serving. Shopping  Look for lower-sodium products, often labeled as "low-sodium" or "no  salt  added."  Always check the sodium content even if foods are labeled as "unsalted" or "no salt added".  Buy fresh foods. ? Avoid canned foods and premade or frozen meals. ? Avoid canned, cured, or processed meats  Buy breads that have less than 80 mg of sodium per slice. Cooking  Eat more home-cooked food and less restaurant, buffet, and fast food.  Avoid adding salt when cooking. Use salt-free seasonings or herbs instead of table salt or sea salt. Check with your health care provider or pharmacist before using salt substitutes.  Cook with plant-based oils, such as canola, sunflower, or olive oil. Meal planning  When eating at a restaurant, ask that your food be prepared with less salt or no salt, if possible.  Avoid foods that contain MSG (monosodium glutamate). MSG is sometimes added to [place] food, bouillon, and some canned foods. What foods are recommended? The items listed may not be a complete list. Talk with your dietitian about what dietary choices are best for you. Grains Low-sodium cereals, including oats, puffed wheat and rice, and shredded wheat. Low-sodium crackers. Unsalted rice. Unsalted pasta. Low-sodium bread. Whole-grain breads and whole-grain pasta. Vegetables Fresh or frozen vegetables. "No salt added" canned vegetables. "No salt added" tomato sauce and paste. Low-sodium or reduced-sodium tomato and vegetable juice. Fruits Fresh, frozen, or canned fruit. Fruit juice. Meats and other protein foods Fresh or frozen (no salt added) meat, poultry, seafood, and fish. Low-sodium canned tuna and salmon. Unsalted nuts. Dried peas, beans, and lentils without added salt. Unsalted canned beans. Eggs. Unsalted nut butters. Dairy Milk. Soy milk. Cheese that is naturally low in sodium, such as ricotta cheese, fresh mozzarella, or Swiss cheese Low-sodium or reduced-sodium cheese. Cream cheese. Yogurt. Fats and oils Unsalted butter. Unsalted margarine with no trans fat. Vegetable  oils such as canola or olive oils. Seasonings and other foods Fresh and dried herbs and spices. Salt-free seasonings. Low-sodium mustard and ketchup. Sodium-free salad dressing. Sodium-free light mayonnaise. Fresh or refrigerated horseradish. Lemon juice. Vinegar. Homemade, reduced-sodium, or low-sodium soups. Unsalted popcorn and pretzels. Low-salt or salt-free chips. What foods are not recommended? The items listed may not be a complete list. Talk with your dietitian about what dietary choices are best for you. Grains Instant hot cereals. Bread stuffing, pancake, and biscuit mixes. Croutons. Seasoned rice or pasta mixes. Noodle soup cups. Boxed or frozen macaroni and cheese. Regular salted crackers. Self-rising flour. Vegetables Sauerkraut, pickled vegetables, and relishes. Olives. [place] fries. Onion rings. Regular canned vegetables (not low-sodium or reduced-sodium). Regular canned tomato sauce and paste (not low-sodium or reduced-sodium). Regular tomato and vegetable juice (not low-sodium or reduced-sodium). Frozen vegetables in sauces. Meats and other protein foods Meat or fish that is salted, canned, smoked, spiced, or pickled. Bacon, ham, sausage, hotdogs, corned beef, chipped beef, packaged lunch meats, salt pork, jerky, pickled herring, anchovies, regular canned tuna, sardines, salted nuts. Dairy Processed cheese and cheese spreads. Cheese curds. Blue cheese. Feta cheese. String cheese. Regular cottage cheese. Buttermilk. Canned milk. Fats and oils Salted butter. Regular margarine. Ghee. Bacon fat. Seasonings and other foods Onion salt, garlic salt, seasoned salt, table salt, and sea salt. Canned and packaged gravies. Worcestershire sauce. Tartar sauce. Barbecue sauce. Teriyaki sauce. Soy sauce, including reduced-sodium. Steak sauce. Fish sauce. Oyster sauce. Cocktail sauce. Horseradish that you find on the shelf. Regular ketchup and mustard. Meat flavorings and tenderizers. Bouillon cubes.  Hot sauce and Tabasco sauce. Premade or packaged marinades. Premade or packaged taco seasonings. Relishes. Regular salad dressings.  Salsa. Potato and tortilla chips. Corn chips and puffs. Salted popcorn and pretzels. Canned or dried soups. Pizza. Frozen entrees and pot pies. Summary  Eating less sodium can help lower your blood pressure, reduce swelling, and protect your heart, liver, and kidneys.  Most people on this plan should limit their sodium intake to 1,500-2,000 mg (milligrams) of sodium each day.  Canned, boxed, and frozen foods are high in sodium. Restaurant foods, fast foods, and pizza are also very high in sodium. You also get sodium by adding salt to food.  Try to cook at home, eat more fresh fruits and vegetables, and eat less fast food, canned, processed, or prepared foods. This information is not intended to replace advice given to you by your health care provider. Make sure you discuss any questions you have with your health care provider. Document Released: 01/05/2002 Document Revised: 07/09/2016 Document Reviewed: 07/09/2016 Elsevier Interactive Patient Education  2017 [place], Ermalinda Barrios, [place]  01/17/2017 2:20 PM    [facility] [address], [place], [place]  [address] Phone: [phone]; Fax: [phone]

## 2017-01-17 NOTE — Patient Instructions (Addendum)
Medication Instructions:    FOR 3 DAYS ONLY  TAKE 80 MG  (TWO 40 MG TABLETS)    THEN TAKE ONE  40 MG TABLET DAILY   If you need a refill on your cardiac medications before your next appointment, please call your pharmacy.  Labwork: NONE ORDERED  TODAY   Testing/Procedures: NONE ORDERED  TODAY    Follow-Up:  IN 2 TO 3 MONTHS WITH DR Tamala Julian    Any Other Special Instructions Will Be Listed Below (If Applicable).                               Low-Sodium Eating Plan Sodium, which is an element that makes up salt, helps you maintain a healthy balance of fluids in your body. Too much sodium can increase your blood pressure and cause fluid and waste to be held in your body. Your health care provider or dietitian may recommend following this plan if you have high blood pressure (hypertension), kidney disease, liver disease, or heart failure. Eating less sodium can help lower your blood pressure, reduce swelling, and protect your heart, liver, and kidneys. What are tips for following this plan? General guidelines  Most people on this plan should limit their sodium intake to 1,500-2,000 mg (milligrams) of sodium each day. Reading food labels  The Nutrition Facts label lists the amount of sodium in one serving of the food. If you eat more than one serving, you must multiply the listed amount of sodium by the number of servings.  Choose foods with less than 140 mg of sodium per serving.  Avoid foods with 300 mg of sodium or more per serving. Shopping  Look for lower-sodium products, often labeled as "low-sodium" or "no salt added."  Always check the sodium content even if foods are labeled as "unsalted" or "no salt added".  Buy fresh foods. ? Avoid canned foods and premade or frozen meals. ? Avoid canned, cured, or processed meats  Buy breads that have less than 80 mg of sodium per slice. Cooking  Eat more home-cooked food and less restaurant, buffet, and fast food.  Avoid  adding salt when cooking. Use salt-free seasonings or herbs instead of table salt or sea salt. Check with your health care provider or pharmacist before using salt substitutes.  Cook with plant-based oils, such as canola, sunflower, or olive oil. Meal planning  When eating at a restaurant, ask that your food be prepared with less salt or no salt, if possible.  Avoid foods that contain MSG (monosodium glutamate). MSG is sometimes added to [place] food, bouillon, and some canned foods. What foods are recommended? The items listed may not be a complete list. Talk with your dietitian about what dietary choices are best for you. Grains Low-sodium cereals, including oats, puffed wheat and rice, and shredded wheat. Low-sodium crackers. Unsalted rice. Unsalted pasta. Low-sodium bread. Whole-grain breads and whole-grain pasta. Vegetables Fresh or frozen vegetables. "No salt added" canned vegetables. "No salt added" tomato sauce and paste. Low-sodium or reduced-sodium tomato and vegetable juice. Fruits Fresh, frozen, or canned fruit. Fruit juice. Meats and other protein foods Fresh or frozen (no salt added) meat, poultry, seafood, and fish. Low-sodium canned tuna and salmon. Unsalted nuts. Dried peas, beans, and lentils without added salt. Unsalted canned beans. Eggs. Unsalted nut butters. Dairy Milk. Soy milk. Cheese that is naturally low in sodium, such as ricotta cheese, fresh mozzarella, or Swiss cheese Low-sodium or reduced-sodium cheese. Cream  cheese. Yogurt. Fats and oils Unsalted butter. Unsalted margarine with no trans fat. Vegetable oils such as canola or olive oils. Seasonings and other foods Fresh and dried herbs and spices. Salt-free seasonings. Low-sodium mustard and ketchup. Sodium-free salad dressing. Sodium-free light mayonnaise. Fresh or refrigerated horseradish. Lemon juice. Vinegar. Homemade, reduced-sodium, or low-sodium soups. Unsalted popcorn and pretzels. Low-salt or salt-free  chips. What foods are not recommended? The items listed may not be a complete list. Talk with your dietitian about what dietary choices are best for you. Grains Instant hot cereals. Bread stuffing, pancake, and biscuit mixes. Croutons. Seasoned rice or pasta mixes. Noodle soup cups. Boxed or frozen macaroni and cheese. Regular salted crackers. Self-rising flour. Vegetables Sauerkraut, pickled vegetables, and relishes. Olives. [place] fries. Onion rings. Regular canned vegetables (not low-sodium or reduced-sodium). Regular canned tomato sauce and paste (not low-sodium or reduced-sodium). Regular tomato and vegetable juice (not low-sodium or reduced-sodium). Frozen vegetables in sauces. Meats and other protein foods Meat or fish that is salted, canned, smoked, spiced, or pickled. Bacon, ham, sausage, hotdogs, corned beef, chipped beef, packaged lunch meats, salt pork, jerky, pickled herring, anchovies, regular canned tuna, sardines, salted nuts. Dairy Processed cheese and cheese spreads. Cheese curds. Blue cheese. Feta cheese. String cheese. Regular cottage cheese. Buttermilk. Canned milk. Fats and oils Salted butter. Regular margarine. Ghee. Bacon fat. Seasonings and other foods Onion salt, garlic salt, seasoned salt, table salt, and sea salt. Canned and packaged gravies. Worcestershire sauce. Tartar sauce. Barbecue sauce. Teriyaki sauce. Soy sauce, including reduced-sodium. Steak sauce. Fish sauce. Oyster sauce. Cocktail sauce. Horseradish that you find on the shelf. Regular ketchup and mustard. Meat flavorings and tenderizers. Bouillon cubes. Hot sauce and Tabasco sauce. Premade or packaged marinades. Premade or packaged taco seasonings. Relishes. Regular salad dressings. Salsa. Potato and tortilla chips. Corn chips and puffs. Salted popcorn and pretzels. Canned or dried soups. Pizza. Frozen entrees and pot pies. Summary  Eating less sodium can help lower your blood pressure, reduce swelling, and  protect your heart, liver, and kidneys.  Most people on this plan should limit their sodium intake to 1,500-2,000 mg (milligrams) of sodium each day.  Canned, boxed, and frozen foods are high in sodium. Restaurant foods, fast foods, and pizza are also very high in sodium. You also get sodium by adding salt to food.  Try to cook at home, eat more fresh fruits and vegetables, and eat less fast food, canned, processed, or prepared foods. This information is not intended to replace advice given to you by your health care provider. Make sure you discuss any questions you have with your health care provider. Document Released: 01/05/2002 Document Revised: 07/09/2016 Document Reviewed: 07/09/2016 Elsevier Interactive Patient Education  2017 [employer].

## 2017-01-23 ENCOUNTER — Other Ambulatory Visit: Payer: Self-pay | Admitting: Interventional Cardiology

## 2017-02-06 ENCOUNTER — Other Ambulatory Visit: Payer: Self-pay | Admitting: Internal Medicine

## 2017-02-12 ENCOUNTER — Ambulatory Visit (INDEPENDENT_AMBULATORY_CARE_PROVIDER_SITE_OTHER): Payer: Medicare Other | Admitting: *Deleted

## 2017-02-12 DIAGNOSIS — I255 Ischemic cardiomyopathy: Secondary | ICD-10-CM

## 2017-02-12 LAB — CUP PACEART REMOTE DEVICE CHECK
Brady Statistic AP VP Percent: 3 %
Brady Statistic AP VS Percent: 78 %
Brady Statistic AS VP Percent: 1 %
Brady Statistic RA Percent Paced: 82 %
Brady Statistic RV Percent Paced: 3 %
Implantable Lead Location: 753860
Implantable Lead Model: 377
Implantable Lead Serial Number: 49267833
Lead Channel Impedance Value: 532 Ohm
Lead Channel Pacing Threshold Amplitude: 0.9 V
Lead Channel Setting Pacing Amplitude: 2.4 V
Lead Channel Setting Pacing Pulse Width: 0.4 ms
MDC IDC LEAD IMPLANT DT: 20161016
MDC IDC LEAD IMPLANT DT: 20161016
MDC IDC LEAD LOCATION: 753859
MDC IDC LEAD SERIAL: 49296158
MDC IDC MSMT LEADCHNL RA PACING THRESHOLD PULSEWIDTH: 0.4 ms
MDC IDC MSMT LEADCHNL RV IMPEDANCE VALUE: 469 Ohm
MDC IDC MSMT LEADCHNL RV PACING THRESHOLD AMPLITUDE: 1.4 V
MDC IDC MSMT LEADCHNL RV PACING THRESHOLD PULSEWIDTH: 0.4 ms
MDC IDC PG IMPLANT DT: 20161016
MDC IDC PG SERIAL: 68596848
MDC IDC SESS DTM: 20180717051616
MDC IDC SET LEADCHNL RA PACING AMPLITUDE: 2 V
MDC IDC STAT BRADY AS VS PERCENT: 17 %

## 2017-02-12 NOTE — Progress Notes (Signed)
Remote pacemaker transmission.   

## 2017-02-13 ENCOUNTER — Encounter: Payer: Self-pay | Admitting: Cardiology

## 2017-03-04 ENCOUNTER — Encounter: Payer: Self-pay | Admitting: Interventional Cardiology

## 2017-03-13 ENCOUNTER — Encounter: Payer: Self-pay | Admitting: Podiatry

## 2017-03-13 ENCOUNTER — Ambulatory Visit (INDEPENDENT_AMBULATORY_CARE_PROVIDER_SITE_OTHER): Payer: Medicare Other | Admitting: Podiatry

## 2017-03-13 DIAGNOSIS — B351 Tinea unguium: Secondary | ICD-10-CM

## 2017-03-13 DIAGNOSIS — M79676 Pain in unspecified toe(s): Secondary | ICD-10-CM | POA: Diagnosis not present

## 2017-03-13 NOTE — Progress Notes (Signed)
   SUBJECTIVE Patient  presents to office today complaining of elongated, thickened nails. Pain while ambulating in shoes. Patient is unable to trim their own nails.   OBJECTIVE General Patient is awake, alert, and oriented x 3 and in no acute distress. Derm Skin is dry and supple bilateral. Negative open lesions or macerations. Remaining integument unremarkable. Nails are tender, long, thickened and dystrophic with subungual debris, consistent with onychomycosis, 1-5 bilateral. No signs of infection noted. Vasc  DP and PT pedal pulses palpable bilaterally. Temperature gradient within normal limits.  Neuro Epicritic and protective threshold sensation diminished bilaterally.  Musculoskeletal Exam No symptomatic pedal deformities noted bilateral. Muscular strength within normal limits.  ASSESSMENT 1. Onychodystrophic nails 1-5 bilateral with hyperkeratosis of nails.  2. Onychomycosis of nail due to dermatophyte bilateral 3. Pain in foot bilateral  PLAN OF CARE 1. Patient evaluated today.  2. Instructed to maintain good pedal hygiene and foot care.  3. Mechanical debridement of nails 1-5 bilaterally performed using a nail nipper. Filed with dremel without incident.  4. Return to clinic in 3 mos.    Sheneika Walstad M. Melea Prezioso, DPM [facility]  Dr. Kwali Wrinkle M. Aaminah Forrester, DPM    [address]                                        [place], [place] [address]                Office [phone]  Fax [phone]      

## 2017-03-19 ENCOUNTER — Encounter: Payer: Self-pay | Admitting: Interventional Cardiology

## 2017-03-19 ENCOUNTER — Encounter (INDEPENDENT_AMBULATORY_CARE_PROVIDER_SITE_OTHER): Payer: Self-pay

## 2017-03-19 ENCOUNTER — Ambulatory Visit (INDEPENDENT_AMBULATORY_CARE_PROVIDER_SITE_OTHER): Payer: Medicare Other | Admitting: Interventional Cardiology

## 2017-03-19 VITALS — BP 112/66 | HR 70 | Ht 72.0 in | Wt 194.8 lb

## 2017-03-19 DIAGNOSIS — I10 Essential (primary) hypertension: Secondary | ICD-10-CM

## 2017-03-19 DIAGNOSIS — N184 Chronic kidney disease, stage 4 (severe): Secondary | ICD-10-CM

## 2017-03-19 DIAGNOSIS — Z950 Presence of cardiac pacemaker: Secondary | ICD-10-CM

## 2017-03-19 DIAGNOSIS — R55 Syncope and collapse: Secondary | ICD-10-CM | POA: Diagnosis not present

## 2017-03-19 DIAGNOSIS — I2510 Atherosclerotic heart disease of native coronary artery without angina pectoris: Secondary | ICD-10-CM | POA: Diagnosis not present

## 2017-03-19 DIAGNOSIS — I5022 Chronic systolic (congestive) heart failure: Secondary | ICD-10-CM | POA: Diagnosis not present

## 2017-03-19 NOTE — Patient Instructions (Signed)
Medication Instructions:  None  Labwork: None  Testing/Procedures: None  Follow-Up: Your physician wants you to follow-up in: 9-12 months with Dr. Smith.  You will receive a reminder letter in the mail two months in advance. If you don't receive a letter, please call our office to schedule the follow-up appointment.   Any Other Special Instructions Will Be Listed Below (If Applicable).     If you need a refill on your cardiac medications before your next appointment, please call your pharmacy.   

## 2017-03-20 NOTE — Progress Notes (Signed)
Cardiology Office Note    Date:  03/20/2017   ID:  Bradley [name], DOB June 24, 1928, MRN [id number]  PCP:  [name], MD  Cardiologist: Sinclair Grooms, MD   Chief Complaint  Patient presents with  . Congestive Heart Failure  . Coronary Artery Disease    History of Present Illness:  Bradley [name] is a 83 y.o. male who presents for systolic heart failure, coronary artery disease denoted by high-grade myocardial perfusion study with decision for medical therapy, chronic kidney disease, hypertension, and history of recurrent neurally mediated syncope. DDD pacemaker has been placed.  He is accompanied by by his daughter. Breathing has been stable. He has not had angina. He is able to lie flat without dyspnea. No lower extremity swelling is noted. He has difficulty with ambulation due to balance. He has not had dizziness, lightheadedness, or syncope.   Past Medical History:  Diagnosis Date  . Arthritis   . BPH (benign prostatic hypertrophy)   . Cardiomyopathy ([place])    a. thought to be ischemic with high risk MPS EF 23%. Reluctant to do cath due to CKD  . Cataract   . Chronic kidney disease   . Dysrhythmia   . GERD (gastroesophageal reflux disease)   . Hypertension   . PPD positive    a. HX POSITIVE PPD WITH NEGATIVE CXR  . Status post placement of cardiac pacemaker    a. 04/2015: bradycardic arrest s/p Biotronik serial #[id number] pacemaker.  . Strabismus    a. right eye  . Symptomatic bradycardia    a. s/p Biotronik serial [id number] pacemaker.  . Syncope and collapse    a. in 2014 and again in 08/2014. Thought to be vasovagal   . Varicosities of leg     Past Surgical History:  Procedure Laterality Date  . CATARACT EXTRACTION W/ INTRAOCULAR LENS IMPLANT     BOTH EYES  . EP IMPLANTABLE DEVICE N/A 05/13/2015   Procedure: Pacemaker Implant;  Surgeon: Evans Lance, MD;  Location: [facility];  Service: Cardiovascular;  Laterality: N/A;  . TONSILLECTOMY       "I was young" (10/15/2012)  . TOTAL KNEE ARTHROPLASTY Left 12/01/2012   Procedure: LEFT TOTAL KNEE ARTHROPLASTY;  Surgeon: [name], MD;  Location: [facility];  Service: Orthopedics;  Laterality: Left;  . TRANSURETHRAL RESECTION OF PROSTATE  2006    Current Medications: Outpatient Medications Prior to Visit  Medication Sig Dispense Refill  . aspirin EC 81 MG tablet Take 1 tablet (81 mg total) by mouth daily.    . carvedilol (COREG) 6.25 MG tablet Take 1 tablet (6.25 mg total) by mouth 2 (two) times daily. 60 tablet 11  . cholecalciferol (VITAMIN D) 1000 UNITS tablet Take 1,000 Units by mouth 2 (two) times daily.    Marland Kitchen docusate sodium (COLACE) 100 MG capsule Take 100 mg by mouth 2 (two) times daily as needed for mild constipation.    . dorzolamide-timolol (COSOPT) 22.3-6.8 MG/ML ophthalmic solution Place 1 drop into both eyes 2 (two) times daily.     . hydrALAZINE (APRESOLINE) 25 MG tablet TAKE 1 TABLET (25 MG TOTAL) BY MOUTH 2 (TWO) TIMES DAILY. 60 tablet 11  . isosorbide mononitrate (IMDUR) 60 MG 24 hr tablet Take 60 mg by mouth daily.    Marland Kitchen latanoprost (XALATAN) 0.005 % ophthalmic solution Place 1 drop into both eyes at bedtime.    . Multiple Vitamin (MULTIVITAMIN WITH MINERALS) TABS tablet Take 1 tablet by mouth daily.    Marland Kitchen  nitroGLYCERIN (NITROSTAT) 0.4 MG SL tablet Place 0.4 mg under the tongue every 5 (five) minutes as needed for chest pain (max 3 doses within 15 minutes call 911).    Marland Kitchen omeprazole (PRILOSEC) 20 MG capsule Take 40 mg by mouth daily.  9  . furosemide (LASIX) 40 MG tablet Take 1.5 tablets (60 mg total) by mouth daily. (Patient taking differently: Take 40 mg by mouth daily. ) 135 tablet 3  . isosorbide mononitrate (IMDUR) 60 MG 24 hr tablet TAKE 1 TABLET BY MOUTH EVERY DAY **DECREASE IN DOSE** (Patient not taking: Reported on 03/19/2017) 90 tablet 2   No facility-administered medications prior to visit.      Allergies:   Patient has no known allergies.   Social History    Social History  . Marital status: Married    Spouse name: N/A  . Number of children: N/A  . Years of education: N/A   Occupational History  . Retired    Social History Main Topics  . Smoking status: Never Smoker  . Smokeless tobacco: Never Used  . Alcohol use No  . Drug use: No  . Sexual activity: No   Other Topics Concern  . None   Social History Narrative   Lives with his wife.     Family History:  The patient's family history includes Heart failure in his mother; Hypertension in his brother and mother; Pneumonia in his father.   ROS:   Please see the history of present illness.    Decreased appetite, weight loss, constipation, intermittent abdominal discomfort. Decreased memory.  All other systems reviewed and are negative.   PHYSICAL EXAM:   VS:  BP 112/66 (BP Location: Left Arm)   Pulse 70   Ht 6' (1.829 m)   Wt 194 lb 12.8 oz (88.4 kg)   BMI 26.42 kg/m    GEN: Frail appearing, well developed, in no acute distress  HEENT: normal  Neck: no JVD, carotid bruits, or masses Cardiac: RRR; no murmurs, rubs, or gallops,no edema  Respiratory:  clear to auscultation bilaterally, normal work of breathing GI: soft, nontender, nondistended, + BS MS: no deformity or atrophy  Skin: warm and dry, no rash Neuro:  Alert and Oriented x 3, Strength and sensation are intact Psych: euthymic mood, full affect  Wt Readings from Last 3 Encounters:  03/19/17 194 lb 12.8 oz (88.4 kg)  01/17/17 184 lb 6.4 oz (83.6 kg)  09/21/16 200 lb 1.9 oz (90.8 kg)      Studies/Labs Reviewed:   EKG:  EKG  Atrial pacing, nonspecific ST abnormality. EKG performed 09/21/2016 and was personally reviewed.  Recent Labs: 05/14/2016: ALT 10; Hemoglobin 10.3; Platelets 170 09/21/2016: NT-Pro BNP 2,828 12/25/2016: BUN 46; Creatinine, Ser 3.12; Potassium 4.6; Sodium 142   Lipid Panel    Component Value Date/Time   CHOL 162 10/16/2012 0510   TRIG 110 10/16/2012 0510   HDL 39 (L) 10/16/2012 0510    CHOLHDL 4.2 10/16/2012 0510   VLDL 22 10/16/2012 0510   LDLCALC 101 (H) 10/16/2012 0510    Additional studies/ records that were reviewed today include:  No new data    ASSESSMENT:    1. Chronic systolic HF (heart failure) ([place])   2. CAD in native artery   3. Syncope and collapse   4. Essential hypertension   5. Chronic kidney disease, stage IV (severe) (H[facility])   6. Status post placement of cardiac pacemaker      PLAN:  In order of problems listed above:  1. No evidence of volume overload. 2. No symptoms to suggest ischemia/angina. 3. No recurrent episodes of syncope. 4. Excellent blood pressure control currently. 5. Stage IV CKD, stable based on blood work 12/25/16. 6. Normally functioning pacemaker.   Progressive frailty. Conservative medical management from a cardiac standpoint. The patient and daughter cautioned to call if dyspnea or swelling.    Medication Adjustments/Labs and Tests Ordered: Current medicines are reviewed at length with the patient today.  Concerns regarding medicines are outlined above.  Medication changes, Labs and Tests ordered today are listed in the Patient Instructions below. Patient Instructions  Medication Instructions:  None  Labwork: None  Testing/Procedures: None  Follow-Up: Your physician wants you to follow-up in: 9-12 months with Dr. Tamala Julian.  You will receive a reminder letter in the mail two months in advance. If you don't receive a letter, please call our office to schedule the follow-up appointment.   Any Other Special Instructions Will Be Listed Below (If Applicable).     If you need a refill on your cardiac medications before your next appointment, please call your pharmacy.      Signed, Sinclair Grooms, MD  03/20/2017 8:22 AM    [facility] [address], [place], [place]  [address] Phone: [phone]; Fax: [phone]

## 2017-05-11 ENCOUNTER — Other Ambulatory Visit: Payer: Self-pay | Admitting: Physician Assistant

## 2017-05-14 ENCOUNTER — Ambulatory Visit (INDEPENDENT_AMBULATORY_CARE_PROVIDER_SITE_OTHER): Payer: Medicare Other | Admitting: *Deleted

## 2017-05-14 DIAGNOSIS — I495 Sick sinus syndrome: Secondary | ICD-10-CM | POA: Diagnosis not present

## 2017-05-14 NOTE — Progress Notes (Signed)
Remote pacemaker transmission.   

## 2017-05-17 ENCOUNTER — Encounter: Payer: Self-pay | Admitting: Cardiology

## 2017-06-06 LAB — CUP PACEART REMOTE DEVICE CHECK
Implantable Lead Implant Date: 20161016
Implantable Lead Implant Date: 20161016
Implantable Lead Model: 377
Implantable Lead Serial Number: 49267833
Implantable Lead Serial Number: 49296158
Implantable Pulse Generator Implant Date: 20161016
Lead Channel Setting Pacing Amplitude: 2 V
Lead Channel Setting Pacing Amplitude: 2.4 V
MDC IDC LEAD LOCATION: 753859
MDC IDC LEAD LOCATION: 753860
MDC IDC SESS DTM: 20181108142736
MDC IDC SET LEADCHNL RV PACING PULSEWIDTH: 0.4 ms
Pulse Gen Serial Number: 68596848

## 2017-08-13 ENCOUNTER — Ambulatory Visit (INDEPENDENT_AMBULATORY_CARE_PROVIDER_SITE_OTHER): Payer: Medicare Other | Admitting: *Deleted

## 2017-08-13 DIAGNOSIS — I495 Sick sinus syndrome: Secondary | ICD-10-CM | POA: Diagnosis not present

## 2017-08-14 NOTE — Progress Notes (Signed)
Remote pacemaker transmission.   

## 2017-08-16 ENCOUNTER — Encounter: Payer: Self-pay | Admitting: Cardiology

## 2017-08-19 LAB — CUP PACEART REMOTE DEVICE CHECK
Date Time Interrogation Session: 20190121154512
Implantable Lead Location: 753859
Implantable Lead Serial Number: 49267833
Implantable Pulse Generator Implant Date: 20161016
MDC IDC LEAD IMPLANT DT: 20161016
MDC IDC LEAD IMPLANT DT: 20161016
MDC IDC LEAD LOCATION: 753860
MDC IDC LEAD SERIAL: 49296158
Pulse Gen Serial Number: 68596848

## 2017-08-30 ENCOUNTER — Other Ambulatory Visit: Payer: Self-pay | Admitting: Interventional Cardiology

## 2017-09-22 ENCOUNTER — Observation Stay (HOSPITAL_COMMUNITY)
Admission: EM | Admit: 2017-09-22 | Discharge: 2017-09-24 | Disposition: A | Discharge status: Home / Self Care | Payer: Medicare Other | Attending: Internal Medicine | Admitting: Internal Medicine

## 2017-09-22 ENCOUNTER — Observation Stay (HOSPITAL_COMMUNITY): Payer: Medicare Other

## 2017-09-22 ENCOUNTER — Emergency Department (HOSPITAL_COMMUNITY): Payer: Medicare Other

## 2017-09-22 ENCOUNTER — Encounter (HOSPITAL_COMMUNITY): Payer: Self-pay

## 2017-09-22 ENCOUNTER — Other Ambulatory Visit: Payer: Self-pay

## 2017-09-22 DIAGNOSIS — R1031 Right lower quadrant pain: Secondary | ICD-10-CM | POA: Insufficient documentation

## 2017-09-22 DIAGNOSIS — Z23 Encounter for immunization: Secondary | ICD-10-CM | POA: Diagnosis not present

## 2017-09-22 DIAGNOSIS — I5042 Chronic combined systolic (congestive) and diastolic (congestive) heart failure: Secondary | ICD-10-CM | POA: Diagnosis not present

## 2017-09-22 DIAGNOSIS — K219 Gastro-esophageal reflux disease without esophagitis: Secondary | ICD-10-CM | POA: Diagnosis not present

## 2017-09-22 DIAGNOSIS — Z6827 Body mass index (BMI) 27.0-27.9, adult: Secondary | ICD-10-CM | POA: Insufficient documentation

## 2017-09-22 DIAGNOSIS — I10 Essential (primary) hypertension: Secondary | ICD-10-CM | POA: Diagnosis present

## 2017-09-22 DIAGNOSIS — R55 Syncope and collapse: Principal | ICD-10-CM | POA: Insufficient documentation

## 2017-09-22 DIAGNOSIS — D649 Anemia, unspecified: Secondary | ICD-10-CM | POA: Diagnosis not present

## 2017-09-22 DIAGNOSIS — Z96652 Presence of left artificial knee joint: Secondary | ICD-10-CM | POA: Diagnosis not present

## 2017-09-22 DIAGNOSIS — I2510 Atherosclerotic heart disease of native coronary artery without angina pectoris: Secondary | ICD-10-CM | POA: Insufficient documentation

## 2017-09-22 DIAGNOSIS — Z7982 Long term (current) use of aspirin: Secondary | ICD-10-CM | POA: Diagnosis not present

## 2017-09-22 DIAGNOSIS — N390 Urinary tract infection, site not specified: Secondary | ICD-10-CM | POA: Diagnosis not present

## 2017-09-22 DIAGNOSIS — Z950 Presence of cardiac pacemaker: Secondary | ICD-10-CM | POA: Insufficient documentation

## 2017-09-22 DIAGNOSIS — I130 Hypertensive heart and chronic kidney disease with heart failure and stage 1 through stage 4 chronic kidney disease, or unspecified chronic kidney disease: Secondary | ICD-10-CM | POA: Diagnosis not present

## 2017-09-22 DIAGNOSIS — E440 Moderate protein-calorie malnutrition: Secondary | ICD-10-CM | POA: Diagnosis not present

## 2017-09-22 DIAGNOSIS — R109 Unspecified abdominal pain: Secondary | ICD-10-CM | POA: Diagnosis present

## 2017-09-22 DIAGNOSIS — N184 Chronic kidney disease, stage 4 (severe): Secondary | ICD-10-CM | POA: Diagnosis not present

## 2017-09-22 DIAGNOSIS — I255 Ischemic cardiomyopathy: Secondary | ICD-10-CM | POA: Insufficient documentation

## 2017-09-22 DIAGNOSIS — Z79899 Other long term (current) drug therapy: Secondary | ICD-10-CM | POA: Insufficient documentation

## 2017-09-22 LAB — COMPREHENSIVE METABOLIC PANEL
ALBUMIN: 3.1 g/dL — AB (ref 3.5–5.0)
ALT: 10 U/L — ABNORMAL LOW (ref 17–63)
ANION GAP: 8 (ref 5–15)
AST: 14 U/L — ABNORMAL LOW (ref 15–41)
Alkaline Phosphatase: 62 U/L (ref 38–126)
BUN: 48 mg/dL — ABNORMAL HIGH (ref 6–20)
CHLORIDE: 106 mmol/L (ref 101–111)
CO2: 21 mmol/L — AB (ref 22–32)
Calcium: 8.2 mg/dL — ABNORMAL LOW (ref 8.9–10.3)
Creatinine, Ser: 2.98 mg/dL — ABNORMAL HIGH (ref 0.61–1.24)
GFR calc non Af Amer: 17 mL/min — ABNORMAL LOW (ref >60)
GFR, EST AFRICAN AMERICAN: 20 mL/min — AB (ref >60)
Glucose, Bld: 114 mg/dL — ABNORMAL HIGH (ref 65–99)
POTASSIUM: 4.7 mmol/L (ref 3.5–5.1)
SODIUM: 135 mmol/L (ref 135–145)
Total Bilirubin: 0.5 mg/dL (ref 0.3–1.2)
Total Protein: 6 g/dL — ABNORMAL LOW (ref 6.5–8.1)

## 2017-09-22 LAB — CBC WITH DIFFERENTIAL/PLATELET
BASOS PCT: 0 %
Basophils Absolute: 0 10*3/uL (ref 0.0–0.1)
EOS ABS: 0.2 10*3/uL (ref 0.0–0.7)
EOS PCT: 4 %
HCT: 32.3 % — ABNORMAL LOW (ref 39.0–52.0)
Hemoglobin: 9.8 g/dL — ABNORMAL LOW (ref 13.0–17.0)
LYMPHS ABS: 1.1 10*3/uL (ref 0.7–4.0)
Lymphocytes Relative: 21 %
MCH: 27.2 pg (ref 26.0–34.0)
MCHC: 30.3 g/dL (ref 30.0–36.0)
MCV: 89.7 fL (ref 78.0–100.0)
MONOS PCT: 6 %
Monocytes Absolute: 0.3 10*3/uL (ref 0.1–1.0)
Neutro Abs: 3.6 10*3/uL (ref 1.7–7.7)
Neutrophils Relative %: 69 %
PLATELETS: 188 10*3/uL (ref 150–400)
RBC: 3.6 MIL/uL — ABNORMAL LOW (ref 4.22–5.81)
RDW: 13.1 % (ref 11.5–15.5)
WBC: 5.2 10*3/uL (ref 4.0–10.5)

## 2017-09-22 LAB — URINALYSIS, ROUTINE W REFLEX MICROSCOPIC
BILIRUBIN URINE: NEGATIVE
Glucose, UA: NEGATIVE mg/dL
HGB URINE DIPSTICK: NEGATIVE
KETONES UR: NEGATIVE mg/dL
NITRITE: NEGATIVE
PROTEIN: NEGATIVE mg/dL
Specific Gravity, Urine: 1.01 (ref 1.005–1.030)
pH: 6 (ref 5.0–8.0)

## 2017-09-22 LAB — ABO/RH: ABO/RH(D): AB POS

## 2017-09-22 LAB — TROPONIN I: Troponin I: <0.03 ng/mL (ref <0.03)

## 2017-09-22 LAB — TYPE AND SCREEN
ABO/RH(D): AB POS
Antibody Screen: NEGATIVE

## 2017-09-22 LAB — CBG MONITORING, ED: Glucose-Capillary: 100 mg/dL — ABNORMAL HIGH (ref 65–99)

## 2017-09-22 LAB — BRAIN NATRIURETIC PEPTIDE: B Natriuretic Peptide: 141.5 pg/mL — ABNORMAL HIGH (ref 0.0–100.0)

## 2017-09-22 LAB — LIPASE, BLOOD: Lipase: 39 U/L (ref 11–51)

## 2017-09-22 LAB — I-STAT TROPONIN, ED: TROPONIN I, POC: 0.03 ng/mL (ref 0.00–0.08)

## 2017-09-22 MED ORDER — ACETAMINOPHEN 325 MG PO TABS: 650.0000 mg | ORAL_TABLET | Freq: Four times a day (QID) | ORAL | Status: DC | PRN

## 2017-09-22 MED ORDER — PANTOPRAZOLE SODIUM 40 MG PO TBEC
40.0000 mg | DELAYED_RELEASE_TABLET | Freq: Every day | ORAL | Status: DC
  Administered 2017-09-23 – 2017-09-24 (×2): 40 mg via ORAL
  Filled 2017-09-22 (×2): qty 1

## 2017-09-22 MED ORDER — DOCUSATE SODIUM 100 MG PO CAPS: 100.0000 mg | ORAL_CAPSULE | Freq: Two times a day (BID) | ORAL | Status: DC | PRN

## 2017-09-22 MED ORDER — SODIUM CHLORIDE 0.9% FLUSH
3.0000 mL | Freq: Two times a day (BID) | INTRAVENOUS | Status: DC
  Administered 2017-09-23 – 2017-09-24 (×3): 3 mL via INTRAVENOUS

## 2017-09-22 MED ORDER — SODIUM CHLORIDE 0.9 % IV SOLN
INTRAVENOUS | Status: DC
  Administered 2017-09-23: via INTRAVENOUS

## 2017-09-22 MED ORDER — IOPAMIDOL (ISOVUE-300) INJECTION 61%
INTRAVENOUS | Status: AC
  Filled 2017-09-22: qty 30

## 2017-09-22 MED ORDER — ASPIRIN EC 81 MG PO TBEC
81.0000 mg | DELAYED_RELEASE_TABLET | Freq: Every day | ORAL | Status: DC
  Administered 2017-09-23 – 2017-09-24 (×2): 81 mg via ORAL
  Filled 2017-09-22 (×2): qty 1

## 2017-09-22 MED ORDER — HEPARIN SODIUM (PORCINE) 5000 UNIT/ML IJ SOLN
5000.0000 [IU] | Freq: Three times a day (TID) | INTRAMUSCULAR | Status: DC
  Administered 2017-09-23 – 2017-09-24 (×6): 5000 [IU] via SUBCUTANEOUS
  Filled 2017-09-22 (×6): qty 1

## 2017-09-22 MED ORDER — PNEUMOCOCCAL VAC POLYVALENT 25 MCG/0.5ML IJ INJ
0.5000 mL | INJECTION | INTRAMUSCULAR | Status: AC
Start: 2017-09-23 — End: 2017-09-23
  Administered 2017-09-23: 0.5 mL via INTRAMUSCULAR
  Filled 2017-09-22: qty 0.5

## 2017-09-22 MED ORDER — HYDRALAZINE HCL 25 MG PO TABS
25.0000 mg | ORAL_TABLET | Freq: Two times a day (BID) | ORAL | Status: DC
  Administered 2017-09-23 – 2017-09-24 (×4): 25 mg via ORAL
  Filled 2017-09-22 (×4): qty 1

## 2017-09-22 MED ORDER — HYDRALAZINE HCL 20 MG/ML IJ SOLN: 5.0000 mg | INTRAMUSCULAR | Status: DC | PRN

## 2017-09-22 MED ORDER — ONDANSETRON HCL 4 MG/2ML IJ SOLN: 4.0000 mg | Freq: Three times a day (TID) | INTRAMUSCULAR | Status: DC | PRN

## 2017-09-22 MED ORDER — NITROGLYCERIN 0.4 MG SL SUBL: 0.4000 mg | SUBLINGUAL_TABLET | SUBLINGUAL | Status: DC | PRN

## 2017-09-22 MED ORDER — CARVEDILOL 12.5 MG PO TABS
6.2500 mg | ORAL_TABLET | Freq: Two times a day (BID) | ORAL | Status: DC
  Administered 2017-09-23 – 2017-09-24 (×4): 6.25 mg via ORAL
  Filled 2017-09-22 (×4): qty 1

## 2017-09-22 MED ORDER — VITAMIN D 1000 UNITS PO TABS
1000.0000 [IU] | ORAL_TABLET | Freq: Two times a day (BID) | ORAL | Status: DC
  Administered 2017-09-23 – 2017-09-24 (×4): 1000 [IU] via ORAL
  Filled 2017-09-22 (×4): qty 1

## 2017-09-22 MED ORDER — ADULT MULTIVITAMIN W/MINERALS CH
1.0000 | ORAL_TABLET | Freq: Every day | ORAL | Status: DC
  Administered 2017-09-23 – 2017-09-24 (×2): 1 via ORAL
  Filled 2017-09-22 (×2): qty 1

## 2017-09-22 MED ORDER — ZOLPIDEM TARTRATE 5 MG PO TABS: 5.0000 mg | ORAL_TABLET | Freq: Every evening | ORAL | Status: DC | PRN

## 2017-09-22 MED ORDER — POLYETHYLENE GLYCOL 3350 17 G PO PACK: 17.0000 g | PACK | Freq: Every day | ORAL | Status: DC | PRN

## 2017-09-22 MED ORDER — OXYCODONE-ACETAMINOPHEN 5-325 MG PO TABS
1.0000 | ORAL_TABLET | Freq: Four times a day (QID) | ORAL | Status: DC | PRN
  Administered 2017-09-23: 1 via ORAL
  Filled 2017-09-22: qty 1

## 2017-09-22 MED ORDER — SODIUM CHLORIDE 0.9 % IV SOLN
INTRAVENOUS | Status: DC
  Administered 2017-09-22: 19:00:00 via INTRAVENOUS

## 2017-09-22 MED ORDER — LATANOPROST 0.005 % OP SOLN
1.0000 [drp] | Freq: Every day | OPHTHALMIC | Status: DC
Start: 2017-09-22 — End: 2017-09-24
  Administered 2017-09-23 (×2): 1 [drp] via OPHTHALMIC
  Filled 2017-09-22: qty 2.5

## 2017-09-22 MED ORDER — ISOSORBIDE MONONITRATE ER 30 MG PO TB24
60.0000 mg | ORAL_TABLET | Freq: Every day | ORAL | Status: DC
  Administered 2017-09-23 – 2017-09-24 (×2): 60 mg via ORAL
  Filled 2017-09-22 (×2): qty 2

## 2017-09-22 MED ORDER — DORZOLAMIDE HCL-TIMOLOL MAL 2-0.5 % OP SOLN
1.0000 [drp] | Freq: Two times a day (BID) | OPHTHALMIC | Status: DC
  Administered 2017-09-23 – 2017-09-24 (×4): 1 [drp] via OPHTHALMIC
  Filled 2017-09-22: qty 10

## 2017-09-22 NOTE — ED Notes (Signed)
Nurse currently drawing labs 

## 2017-09-22 NOTE — ED Provider Notes (Signed)
[facility] EMERGENCY DEPARTMENT Provider Note   CSN: [id number] Arrival date & time: 09/22/17  1703     History   Chief Complaint Chief Complaint  Patient presents with  . Loss of Consciousness    HPI Bradley [name] is a 84 y.o. male.  HPI Patient presents to the emergency room for evaluation of a syncopal event.  Patient states he has had a little bit of an upset stomach most of the day today.  He  also had some mild abdominal discomfort.  He denies any trouble with vomiting or diarrhea today.  Patient states he was sitting at the kitchen table getting ready to eat some [employer] with his family..  The next thing he recalls is EMS arriving at his house.  Patient did not fall out of his chair.  His family members called EMS.  Family members are not currently here to describe what they saw.  Patient denies any chest pain right now.  Denies any abdominal pain.  He has history of syncope and has a pacemaker in place. Past Medical History:  Diagnosis Date  . Arthritis   . BPH (benign prostatic hypertrophy)   . Cardiomyopathy ([place])    a. thought to be ischemic with high risk MPS EF 23%. Reluctant to do cath due to CKD  . Cataract   . Chronic kidney disease   . Dysrhythmia   . GERD (gastroesophageal reflux disease)   . Hypertension   . PPD positive    a. HX POSITIVE PPD WITH NEGATIVE CXR  . Status post placement of cardiac pacemaker    a. 04/2015: bradycardic arrest s/p Biotronik serial #[id number] pacemaker.  . Strabismus    a. right eye  . Symptomatic bradycardia    a. s/p Biotronik serial [id number] pacemaker.  . Syncope and collapse    a. in 2014 and again in 08/2014. Thought to be vasovagal   . Varicosities of leg     Patient Active Problem List   Diagnosis Date Noted  . Symptomatic bradycardia   . BPH (benign prostatic hypertrophy)   . Syncope and collapse   . Hypertension   . Status post placement of cardiac pacemaker   . CAD in native artery  09/21/2014  . Chronic systolic HF (heart failure) ([place]) 09/07/2014  . Chronic kidney disease, stage IV (severe) ([place]) 08/25/2014  . UTI (lower urinary tract infection) 09/13/2011  . Urethral stricture 09/13/2011  . GERD (gastroesophageal reflux disease) 09/13/2011  . Osteoarthritis of both knees 09/13/2011    Past Surgical History:  Procedure Laterality Date  . CATARACT EXTRACTION W/ INTRAOCULAR LENS IMPLANT     BOTH EYES  . EP IMPLANTABLE DEVICE N/A 05/13/2015   Procedure: Pacemaker Implant;  Surgeon: Evans Lance, MD;  Location: [facility];  Service: Cardiovascular;  Laterality: N/A;  . TONSILLECTOMY     "I was young" (10/15/2012)  . TOTAL KNEE ARTHROPLASTY Left 12/01/2012   Procedure: LEFT TOTAL KNEE ARTHROPLASTY;  Surgeon: [name], MD;  Location: [facility];  Service: Orthopedics;  Laterality: Left;  . TRANSURETHRAL RESECTION OF PROSTATE  2006       Home Medications    Prior to Admission medications   Medication Sig Start Date End Date Taking? Authorizing Provider  aspirin EC 81 MG tablet Take 1 tablet (81 mg total) by mouth daily. 09/07/14   Belva Crome, MD  carvedilol (COREG) 6.25 MG tablet TAKE 1 TABLET (6.25 MG TOTAL) BY MOUTH 2 (TWO) TIMES DAILY.  05/13/17   Eileen Stanford, PA-C  cholecalciferol (VITAMIN D) 1000 UNITS tablet Take 1,000 Units by mouth 2 (two) times daily.    [provider]  docusate sodium (COLACE) 100 MG capsule Take 100 mg by mouth 2 (two) times daily as needed for mild constipation.    [provider]  dorzolamide-timolol (COSOPT) 22.3-6.8 MG/ML ophthalmic solution Place 1 drop into both eyes 2 (two) times daily.  08/21/14   [provider]  furosemide (LASIX) 40 MG tablet Take 1 tablet (40 mg total) by mouth daily. 09/02/17   Belva Crome, MD  hydrALAZINE (APRESOLINE) 25 MG tablet TAKE 1 TABLET (25 MG TOTAL) BY MOUTH 2 (TWO) TIMES DAILY. 02/07/17   Evans Lance, MD  isosorbide mononitrate (IMDUR) 60 MG 24 hr  tablet Take 60 mg by mouth daily.    [provider]  latanoprost (XALATAN) 0.005 % ophthalmic solution Place 1 drop into both eyes at bedtime.    [provider]  Multiple Vitamin (MULTIVITAMIN WITH MINERALS) TABS tablet Take 1 tablet by mouth daily.    [provider]  nitroGLYCERIN (NITROSTAT) 0.4 MG SL tablet Place 0.4 mg under the tongue every 5 (five) minutes as needed for chest pain (max 3 doses within 15 minutes call 911).    [provider]  omeprazole (PRILOSEC) 20 MG capsule Take 40 mg by mouth daily. 04/01/16   [provider]    Family History Family History  Problem Relation Age of Onset  . Heart failure Mother   . Hypertension Mother   . Pneumonia Father   . Hypertension Brother   . Heart attack Neg Hx   . Stroke Neg Hx     Social History Social History   Tobacco Use  . Smoking status: Never Smoker  . Smokeless tobacco: Never Used  Substance Use Topics  . Alcohol use: No  . Drug use: No     Allergies   Patient has no known allergies.   Review of Systems Review of Systems  All other systems reviewed and are negative.    Physical Exam Updated Vital Signs BP 136/66   Pulse 68   Resp 18   Ht 1.854 m (6\' 1" )   Wt 95.3 kg (210 lb)   SpO2 98%   BMI 27.71 kg/m   Physical Exam  Constitutional: No distress.  Elderly, frail  HENT:  Head: Normocephalic and atraumatic.  Right Ear: External ear normal.  Left Ear: External ear normal.  Eyes: Conjunctivae are normal. Right eye exhibits no discharge. Left eye exhibits no discharge. No scleral icterus.  Neck: Neck supple. No tracheal deviation present.  Cardiovascular: Normal rate, regular rhythm and intact distal pulses.  Pulmonary/Chest: Effort normal and breath sounds normal. No stridor. No respiratory distress. He has no wheezes. He has no rales.  Abdominal: Soft. Bowel sounds are normal. He exhibits no distension. There is no tenderness. There is no rebound and  no guarding.  Musculoskeletal: He exhibits no edema or tenderness.  Neurological: He is alert. He has normal strength. No cranial nerve deficit (no facial droop, extraocular movements intact, no slurred speech) or sensory deficit. He exhibits normal muscle tone. He displays no seizure activity. Coordination normal.  Skin: Skin is warm and dry. No rash noted.  Psychiatric: He has a normal mood and affect.  Nursing note and vitals reviewed.    ED Treatments / Results  Labs (all labs ordered are listed, but only abnormal results are displayed) Labs Reviewed  CBC WITH DIFFERENTIAL/PLATELET - Abnormal; Notable for the following components:      Result Value   RBC 3.60 (*)    Hemoglobin 9.8 (*)    HCT 32.3 (*)    All other components within normal limits  COMPREHENSIVE METABOLIC PANEL - Abnormal; Notable for the following components:   CO2 21 (*)    Glucose, Bld 114 (*)    BUN 48 (*)    Creatinine, Ser 2.98 (*)    Calcium 8.2 (*)    Total Protein 6.0 (*)    Albumin 3.1 (*)    AST 14 (*)    ALT 10 (*)    GFR calc non Af Amer 17 (*)    GFR calc Af Amer 20 (*)    All other components within normal limits  CBG MONITORING, ED - Abnormal; Notable for the following components:   Glucose-Capillary 100 (*)    All other components within normal limits  LIPASE, BLOOD  URINALYSIS, ROUTINE W REFLEX MICROSCOPIC  I-STAT TROPONIN, ED  TYPE AND SCREEN  ABO/RH    EKG  EKG Interpretation  Date/Time:  Sunday September 22 2017 17:16:20 EST Ventricular Rate:  70 PR Interval:    QRS Duration: 116 QT Interval:  431 QTC Calculation: 466 R Axis:   44 Text Interpretation:  A-V dual-paced rhythm with some inhibition No further analysis attempted due to paced rhythm paced rhythm is new compared to pror tracing in oct 2016 Confirmed by Dorie Rank [phone]) on 09/22/2017 6:01:35 PM       Radiology Ct Head Wo Contrast  Result Date: 09/22/2017 CLINICAL DATA:  Syncope, nausea, vomiting EXAM: CT HEAD  WITHOUT CONTRAST TECHNIQUE: Contiguous axial images were obtained from the base of the skull through the vertex without intravenous contrast. COMPARISON:  09/21/2014 FINDINGS: Brain: Age related volume loss. Mild chronic small vessel disease. No acute intracranial abnormality. Specifically, no hemorrhage, hydrocephalus, mass lesion, acute infarction, or significant intracranial injury. Vascular: No hyperdense vessel or unexpected calcification. Skull: No acute calvarial abnormality. Sinuses/Orbits: Complete opacification of the right maxillary sinus with bulging of the medial wall, new since prior study. Mastoid air cells are clear. Orbital soft tissues unremarkable. Other: None IMPRESSION: No acute intracranial abnormality. Atrophy, chronic microvascular disease. Right maxillary sinus disease as above. Electronically Signed   By: Rolm Baptise M.D.   On: 09/22/2017 19:37   Dg Chest Port 1 View  Result Date: 09/22/2017 CLINICAL DATA:  syncopal episode this afternoon. pt was out for 3 minutes. Pt has a demand pacemaker, per EMS pt had pacemaker placed d/t episodes of syncope in the past. Pt has hx of frequent PVCs.Pt Complains of epigastric discomfort. No SOBN EXAM: PORTABLE CHEST 1 VIEW COMPARISON:  05/14/2015 FINDINGS: Cardiac silhouette is normal in size. No mediastinal or hilar masses. No evidence of adenopathy. Left anterior chest wall sequential pacemaker is stable, leads projecting over the right atrium and right ventricle. Clear lungs. Stable elevation the right hemidiaphragm. No convincing pleural effusion. No pneumothorax. Skeletal structures are grossly intact. IMPRESSION: No acute cardiopulmonary disease. Electronically Signed   By: Lajean Manes M.D.   On: 09/22/2017 18:50    Procedures Procedures (including critical care time)  Medications Ordered in ED Medications  0.9 %  sodium chloride infusion ( Intravenous New Bag/Given 09/22/17 1848)     Initial Impression / Assessment and Plan / ED  Course  I have reviewed the triage vital signs and the nursing notes.  Pertinent labs & imaging results that were available during  my care of the patient were reviewed by me and considered in my medical decision making (see chart for details).  Clinical Course as of Sep 22 2041  Nancy Fetter Sep 22, 2017  2042 Anemia noted on his laboratory tests but this is not significantly changed to prior labs.  Chronic renal insufficiency also noted but again this is not significantly changed from his baseline.  [JK]    Clinical Course User Index [JK] Dorie Rank, MD    Patient presented to the emergency room after a syncopal episode.  Does not sound like the patient had much of a prodrome.  He is rather asymptomatic now.  Patient does have history of pacemaker and cardiomyopathy.  He is certainly at risk for cardiac dysrhythmia.  I have ordered an interrogation of his pacemaker.  We are waiting on those results.  I think it is reasonable to bring in for observation considering his risk factors.  Final Clinical Impressions(s) / ED Diagnoses   Final diagnoses:  Syncope and collapse      Dorie Rank, MD 09/22/17 2044

## 2017-09-22 NOTE — ED Notes (Signed)
Per [employer], no abnormalities were noted with pt pacemaker interrogation

## 2017-09-22 NOTE — H&P (Addendum)
History and Physical    Bradley [name] NFA:[id number] DOB: [birth year] DOA: 09/22/2017  Referring MD/NP/PA:   PCP: [name], MD   Patient coming from:  The patient is coming from home.  At baseline, pt is independent for most of ADL.    Chief Complaint: syncope and abdominal pain  HPI: Bradley [name] is a 84 y.o. male with medical history significant of hypertension, GERD, varicose vein, PVC, pacemaker placement, CK-4, BPH, CHF with EF 30%, CAD, who presents with syncope and abdominal pain.  Per report, he passed out for about 3 min when he was sitting at the kitchen table about to eat dinner. Per EMS pt did not fall out of his chair and did not hit his head. No seizure activity reported. Patient denies unilateral weakness, numbness or tingling to extremities. No facial droop, slurred speech, vision change or hearing loss. Patient denies chest pain, shortness of breath, cough, fever or chills. Patient states that he has been having abdominal pain the past 4 days, which is located in the R lower quadrant, constant, dull, moderate, nonradiating. Denies nausea, vomiting, diarrhea. No symptoms of UTI. Do not see any bloody urine or stool.  ED Course: pt was found to have WBC 5.0, lipase is 39, positive urinalysis with moderate amount of leukocyte and many bacteria, stable renal function, temperature normal, heart rate in 90s, doxycycline 98% on room air, negative CT scan of his head. Patient is placed on telemetry bed for observation. Pacemaker interrogation was performed, which was normal  Review of Systems:   General: no fevers, chills, no body weight gain, has fatigue HEENT: no blurry vision, hearing changes or sore throat Respiratory: no dyspnea, coughing, wheezing CV: no chest pain, no palpitations GI: no nausea, vomiting, has abdominal pain, no diarrhea, constipation GU: no dysuria, burning on urination, increased urinary frequency, hematuria  Ext: no leg edema Neuro: no  unilateral weakness, numbness, or tingling, no vision change or hearing loss. Had syncope. Skin: no rash, no skin tear. MSK: No muscle spasm, no deformity, no limitation of range of movement in spin Heme: No easy bruising.  Travel history: No recent long distant travel.  Allergy: No Known Allergies  Past Medical History:  Diagnosis Date  . Arthritis   . BPH (benign prostatic hypertrophy)   . Cardiomyopathy ([place])    a. thought to be ischemic with high risk MPS EF 23%. Reluctant to do cath due to CKD  . Cataract   . Chronic kidney disease   . Dysrhythmia   . GERD (gastroesophageal reflux disease)   . Hypertension   . PPD positive    a. HX POSITIVE PPD WITH NEGATIVE CXR  . Status post placement of cardiac pacemaker    a. 04/2015: bradycardic arrest s/p [employer] serial #[id number] pacemaker.  . Strabismus    a. right eye  . Symptomatic bradycardia    a. s/p [employer] serial [id number] pacemaker.  . Syncope and collapse    a. in 2014 and again in 08/2014. Thought to be vasovagal   . Varicosities of leg     Past Surgical History:  Procedure Laterality Date  . CATARACT EXTRACTION W/ INTRAOCULAR LENS IMPLANT     BOTH EYES  . EP IMPLANTABLE DEVICE N/A 05/13/2015   Procedure: Pacemaker Implant;  Surgeon: Evans Lance, MD;  Location: [facility];  Service: Cardiovascular;  Laterality: N/A;  . TONSILLECTOMY     "I was young" (10/15/2012)  . TOTAL KNEE ARTHROPLASTY Left 12/01/2012   Procedure:  LEFT TOTAL KNEE ARTHROPLASTY;  Surgeon: [name], MD;  Location: [facility];  Service: Orthopedics;  Laterality: Left;  . TRANSURETHRAL RESECTION OF PROSTATE  2006    Social History:  reports that  has never smoked. he has never used smokeless tobacco. He reports that he does not drink alcohol or use drugs.  Family History:  Family History  Problem Relation Age of Onset  . Heart failure Mother   . Hypertension Mother   . Pneumonia Father   . Hypertension Brother   . Heart attack  Neg Hx   . Stroke Neg Hx      Prior to Admission medications   Medication Sig Start Date End Date Taking? Authorizing Provider  aspirin EC 81 MG tablet Take 1 tablet (81 mg total) by mouth daily. 09/07/14   Belva Crome, MD  carvedilol (COREG) 6.25 MG tablet TAKE 1 TABLET (6.25 MG TOTAL) BY MOUTH 2 (TWO) TIMES DAILY. 05/13/17   Eileen Stanford, PA-C  cholecalciferol (VITAMIN D) 1000 UNITS tablet Take 1,000 Units by mouth 2 (two) times daily.    [provider]  docusate sodium (COLACE) 100 MG capsule Take 100 mg by mouth 2 (two) times daily as needed for mild constipation.    [provider]  dorzolamide-timolol (COSOPT) 22.3-6.8 MG/ML ophthalmic solution Place 1 drop into both eyes 2 (two) times daily.  08/21/14   [provider]  furosemide (LASIX) 40 MG tablet Take 1 tablet (40 mg total) by mouth daily. 09/02/17   Belva Crome, MD  hydrALAZINE (APRESOLINE) 25 MG tablet TAKE 1 TABLET (25 MG TOTAL) BY MOUTH 2 (TWO) TIMES DAILY. 02/07/17   Evans Lance, MD  isosorbide mononitrate (IMDUR) 60 MG 24 hr tablet Take 60 mg by mouth daily.    [provider]  latanoprost (XALATAN) 0.005 % ophthalmic solution Place 1 drop into both eyes at bedtime.    [provider]  Multiple Vitamin (MULTIVITAMIN WITH MINERALS) TABS tablet Take 1 tablet by mouth daily.    [provider]  nitroGLYCERIN (NITROSTAT) 0.4 MG SL tablet Place 0.4 mg under the tongue every 5 (five) minutes as needed for chest pain (max 3 doses within 15 minutes call 911).    [provider]  omeprazole (PRILOSEC) 20 MG capsule Take 40 mg by mouth daily. 04/01/16   [provider]    Physical Exam: Vitals:   09/22/17 2045 09/22/17 2115 09/22/17 2200 09/22/17 2235  BP: 134/72 139/86 138/81 (!) 158/71  Pulse: 65 71 67 66  Resp: 18 15 19 18   Temp:    (!) 97.5 F (36.4 C)  TempSrc:    Oral  SpO2: 97% 100% 97% 96%  Weight:    82.7 kg (182 lb 4.8 oz)  Height:    6'  1" (1.854 m)   General: Not in acute distress. Dry mucus and membrane. HEENT:       Eyes: PERRL, EOMI, no scleral icterus.       ENT: No discharge from the ears and nose, no pharynx injection, no tonsillar enlargement.        Neck: No JVD, no bruit, no mass felt. Heme: No neck lymph node enlargement. Cardiac: S1/S2, RRR, No murmurs, No gallops or rubs. Respiratory: No rales, wheezing, rhonchi or rubs. GI: Soft, nondistended, has RLQ tenderness, no rebound pain, no organomegaly, BS present. GU: No hematuria Ext: No pitting leg edema bilaterally. 2+DP/PT pulse bilaterally. Musculoskeletal: No joint deformities, No joint redness or warmth, no  limitation of ROM in spin. Skin: No rashes.  Neuro: Alert, oriented X3, cranial nerves II-XII grossly intact, moves all extremities normally. Muscle strength 5/5 in all extremities, sensation to light touch intact. Brachial reflex 2+ bilaterally. Negative Babinski's sign.  Psych: Patient is not psychotic, no suicidal or hemocidal ideation.  Labs on Admission: I have personally reviewed following labs and imaging studies  CBC: Recent Labs  Lab 09/22/17 1816  WBC 5.2  NEUTROABS 3.6  HGB 9.8*  HCT 32.3*  MCV 89.7  PLT 086   Basic Metabolic Panel: Recent Labs  Lab 09/22/17 1816  NA 135  K 4.7  CL 106  CO2 21*  GLUCOSE 114*  BUN 48*  CREATININE 2.98*  CALCIUM 8.2*   GFR: Estimated Creatinine Clearance: 19.7 mL/min (A) (by C-G formula based on SCr of 2.98 mg/dL (H)). Liver Function Tests: Recent Labs  Lab 09/22/17 1816  AST 14*  ALT 10*  ALKPHOS 62  BILITOT 0.5  PROT 6.0*  ALBUMIN 3.1*   Recent Labs  Lab 09/22/17 1816  LIPASE 39   No results for input(s): AMMONIA in the last 168 hours. Coagulation Profile: No results for input(s): INR, PROTIME in the last 168 hours. Cardiac Enzymes: Recent Labs  Lab 09/22/17 2121  TROPONINI <0.03   BNP (last 3 results) No results for input(s): PROBNP in the last 8760  hours. HbA1C: No results for input(s): HGBA1C in the last 72 hours. CBG: Recent Labs  Lab 09/22/17 1829  GLUCAP 100*   Lipid Profile: No results for input(s): CHOL, HDL, LDLCALC, TRIG, CHOLHDL, LDLDIRECT in the last 72 hours. Thyroid Function Tests: No results for input(s): TSH, T4TOTAL, FREET4, T3FREE, THYROIDAB in the last 72 hours. Anemia Panel: No results for input(s): VITAMINB12, FOLATE, FERRITIN, TIBC, IRON, RETICCTPCT in the last 72 hours. Urine analysis:    Component Value Date/Time   COLORURINE YELLOW 09/22/2017 2150   APPEARANCEUR CLOUDY (A) 09/22/2017 2150   LABSPEC 1.010 09/22/2017 2150   PHURINE 6.0 09/22/2017 2150   GLUCOSEU NEGATIVE 09/22/2017 2150   HGBUR NEGATIVE 09/22/2017 2150   BILIRUBINUR NEGATIVE 09/22/2017 2150   KETONESUR NEGATIVE 09/22/2017 2150   PROTEINUR NEGATIVE 09/22/2017 2150   UROBILINOGEN 0.2 09/21/2014 1402   NITRITE NEGATIVE 09/22/2017 2150   LEUKOCYTESUR MODERATE (A) 09/22/2017 2150   Sepsis Labs: @LABRCNTIP (procalcitonin:4,lacticidven:4) )No results found for this or any previous visit (from the past 240 hour(s)).   Radiological Exams on Admission: Ct Abdomen Pelvis Wo Contrast  Result Date: 09/23/2017 CLINICAL DATA:  84 year old male with right lower quadrant abdominal pain. EXAM: CT ABDOMEN AND PELVIS WITHOUT CONTRAST TECHNIQUE: Multidetector CT imaging of the abdomen and pelvis was performed following the standard protocol without IV contrast. COMPARISON:  Renal ultrasound dated 09/27/2016 FINDINGS: Evaluation of this exam is limited in the absence of intravenous contrast. Lower chest: Minimal bibasilar linear atelectasis/scarring. The visualized lung bases are otherwise clear. There is coronary vascular calcification and cardiac pacemaker wire. No intra-abdominal free air or free fluid. Hepatobiliary: The liver is unremarkable. No intrahepatic biliary ductal dilatation. Small gallstones. No pericholecystic fluid or evidence of acute  cholecystitis by CT. Pancreas: Unremarkable. No pancreatic ductal dilatation or surrounding inflammatory changes. Spleen: Normal in size without focal abnormality. Adrenals/Urinary Tract: Mild bilateral renal parenchyma atrophy. Small bilateral exophytic hypodense lesions are not characterized on this noncontrast CT. There is no hydronephrosis or nephrolithiasis on either side. The visualized ureters are unremarkable. There is trabeculation of the bladder wall with multiple diverticula measuring up to 5 cm from the left  lateral bladder wall. Stomach/Bowel: There is no bowel obstruction or active inflammation. There are scattered sigmoid, chronic, and distal small bowel diverticula without active inflammatory changes. Normal appendix. Vascular/Lymphatic: Moderate aortoiliac atherosclerotic disease. No portal venous gas. There is no adenopathy. Reproductive: The prostate and seminal vesicles are grossly unremarkable. No pelvic mass Other: Small fat containing umbilical hernia. Musculoskeletal: Degenerative changes of the spine as well as multilevel disc desiccation. No acute osseous pathology. IMPRESSION: 1. Cholelithiasis. 2. Colonic and distal small bowel diverticulosis. No bowel obstruction or active inflammation. Normal appendix. 3. Trabeculated urinary bladder with multiple large diverticula. Electronically Signed   By: Anner Crete M.D.   On: 09/23/2017 00:08   Ct Head Wo Contrast  Result Date: 09/22/2017 CLINICAL DATA:  Syncope, nausea, vomiting EXAM: CT HEAD WITHOUT CONTRAST TECHNIQUE: Contiguous axial images were obtained from the base of the skull through the vertex without intravenous contrast. COMPARISON:  09/21/2014 FINDINGS: Brain: Age related volume loss. Mild chronic small vessel disease. No acute intracranial abnormality. Specifically, no hemorrhage, hydrocephalus, mass lesion, acute infarction, or significant intracranial injury. Vascular: No hyperdense vessel or unexpected calcification.  Skull: No acute calvarial abnormality. Sinuses/Orbits: Complete opacification of the right maxillary sinus with bulging of the medial wall, new since prior study. Mastoid air cells are clear. Orbital soft tissues unremarkable. Other: None IMPRESSION: No acute intracranial abnormality. Atrophy, chronic microvascular disease. Right maxillary sinus disease as above. Electronically Signed   By: Rolm Baptise M.D.   On: 09/22/2017 19:37   Dg Chest Port 1 View  Result Date: 09/22/2017 CLINICAL DATA:  syncopal episode this afternoon. pt was out for 3 minutes. Pt has a demand pacemaker, per EMS pt had pacemaker placed d/t episodes of syncope in the past. Pt has hx of frequent PVCs.Pt Complains of epigastric discomfort. No SOBN EXAM: PORTABLE CHEST 1 VIEW COMPARISON:  05/14/2015 FINDINGS: Cardiac silhouette is normal in size. No mediastinal or hilar masses. No evidence of adenopathy. Left anterior chest wall sequential pacemaker is stable, leads projecting over the right atrium and right ventricle. Clear lungs. Stable elevation the right hemidiaphragm. No convincing pleural effusion. No pneumothorax. Skeletal structures are grossly intact. IMPRESSION: No acute cardiopulmonary disease. Electronically Signed   By: Lajean Manes M.D.   On: 09/22/2017 18:50     EKG: Independently reviewed.  Paced rhythm, QTc 466.   Assessment/Plan Principal Problem:   Syncope and collapse Active Problems:   Acute lower UTI   GERD (gastroesophageal reflux disease)   Chronic kidney disease, stage IV (severe) (HCC)   Chronic combined systolic (congestive) and diastolic (congestive) heart failure (HCC)   CAD in native artery   Hypertension   Status post placement of cardiac pacemaker   Abdominal pain   Normocytic anemia   Protein-calorie malnutrition, moderate (HCC)   Syncope and collapse:   Etiology is not clear. Pacemaker interrogation was performed, which was normal. CT-head negative. The differential diagnosis is broad,  including vasovagal syncope, TIA, ACS (less likely, given no chest pain), orthostatic status (pt is dry), carotid artery stenosis.  - Please on tele bed for obs - Orthostatic vital signs  - Carotid doppler - 2d echo - Neuro checks  - IVF: NS 75 cc/h - PT/OT eval and treat   Abdominal pain: has RLQ abdominal pain, but no nausea, vomiting, diarrhea. No fever or chills. Lipase normal. Etiology is not clear. -Follow-up CT abdomen/pelvis without contrast -When necessary Zofran for nausea -prn percocet for pain  UTI: -rocephin -Bx and Ux  GERD: -Protonix  Chronic  kidney disease, stage IV (severe) ([place]): Stable. Baseline creatinine 2.9-3.2. His creatinine is 2.98, BUN 48.  -Follow up renal function by BMP  Chronic combined systolic (congestive) and diastolic (congestive) heart failure ([place]): 2-D echo on 09/23/14 showed EF of 30-35% with grade 1 diastolic dysfunction. Patient does not have leg edema, JVD. No shortness rest. CHF is compensated. -Hold Lasix due to possible dehydration and syncope -Check BNP  CAD in native artery: no CP -ASA, coreg -prn NTG  HTN:  -Continue home medications: Coreg, hydralazine -IV hydralazine prn  Normocytic anemia: Hemoglobin close to baseline. 9.8 which was a 10.3 at 05/14/16. -Follow-up by CBC  Protein-calorie malnutrition, moderate ([place]) -nutrition consult   DVT ppx: SQ Heparin     Code Status: Full code Family Communication: None at bed side.  Disposition Plan:  Anticipate discharge back to previous home environment Consults called:  none Admission status: Obs / tele     Date of Service 09/23/2017    Ivor Costa [facility] Pager [phone]  If 7PM-7AM, please contact night-coverage [email or web address] Password [facility] 09/23/2017, 12:42 AM

## 2017-09-22 NOTE — ED Triage Notes (Signed)
Pt brought in by [facility] from home for a syncopal episode this afternoon. Pt states he was sitting at the kitchen table about to eat, when the next thing he knows EMS is at his house. Per EMS pt did not fall out of his chair and did not hit his head. Per EMS pt was out for 3 minutes. Pt has a demand pacemaker, per EMS pt had pacemaker placed d/t episodes of syncope in the past. Pt has hx of frequent PVCs. Per EMS pt is A+Ox4, endorses NV, given zofran PTA.

## 2017-09-22 NOTE — ED Notes (Signed)
[employer] representative at bedside interrogating pacemaker

## 2017-09-22 NOTE — ED Notes (Signed)
Rep from [employer] called, will come to interrogate pt pacemaker

## 2017-09-22 NOTE — ED Notes (Signed)
Attempted to call report x 1  

## 2017-09-23 ENCOUNTER — Observation Stay (HOSPITAL_BASED_OUTPATIENT_CLINIC_OR_DEPARTMENT_OTHER): Payer: Medicare Other

## 2017-09-23 DIAGNOSIS — I10 Essential (primary) hypertension: Secondary | ICD-10-CM | POA: Diagnosis not present

## 2017-09-23 DIAGNOSIS — I351 Nonrheumatic aortic (valve) insufficiency: Secondary | ICD-10-CM

## 2017-09-23 DIAGNOSIS — D649 Anemia, unspecified: Secondary | ICD-10-CM | POA: Diagnosis not present

## 2017-09-23 DIAGNOSIS — N390 Urinary tract infection, site not specified: Secondary | ICD-10-CM | POA: Diagnosis not present

## 2017-09-23 DIAGNOSIS — R55 Syncope and collapse: Secondary | ICD-10-CM

## 2017-09-23 LAB — MRSA PCR SCREENING: MRSA by PCR: POSITIVE — AB

## 2017-09-23 LAB — ECHOCARDIOGRAM COMPLETE
Height: 73 in
WEIGHTICAEL: 2912 [oz_av]

## 2017-09-23 LAB — GLUCOSE, CAPILLARY: Glucose-Capillary: 89 mg/dL (ref 65–99)

## 2017-09-23 MED ORDER — MUPIROCIN 2 % EX OINT
1.0000 "application " | TOPICAL_OINTMENT | Freq: Two times a day (BID) | CUTANEOUS | Status: DC
  Administered 2017-09-23 – 2017-09-24 (×3): 1 via NASAL
  Filled 2017-09-23: qty 22

## 2017-09-23 MED ORDER — SODIUM CHLORIDE 0.9 % IV SOLN
1.0000 g | INTRAVENOUS | Status: DC
  Administered 2017-09-23: 1 g via INTRAVENOUS
  Filled 2017-09-23: qty 10

## 2017-09-23 MED ORDER — PERFLUTREN LIPID MICROSPHERE
1.0000 mL | INTRAVENOUS | Status: AC | PRN
  Administered 2017-09-23: 2 mL via INTRAVENOUS
  Filled 2017-09-23: qty 10

## 2017-09-23 MED ORDER — CHLORHEXIDINE GLUCONATE CLOTH 2 % EX PADS
6.0000 | MEDICATED_PAD | Freq: Every day | CUTANEOUS | Status: DC
  Administered 2017-09-23 – 2017-09-24 (×2): 6 via TOPICAL

## 2017-09-23 MED ORDER — CEFTRIAXONE SODIUM 1 G IJ SOLR
1.0000 g | Freq: Once | INTRAMUSCULAR | Status: AC
  Administered 2017-09-23: 1 g via INTRAVENOUS
  Filled 2017-09-23: qty 10

## 2017-09-23 MED ORDER — ENSURE ENLIVE PO LIQD
237.0000 mL | Freq: Two times a day (BID) | ORAL | Status: DC
  Administered 2017-09-23 – 2017-09-24 (×3): 237 mL via ORAL

## 2017-09-23 NOTE — Progress Notes (Signed)
PROGRESS NOTE    Bradley [name]  PTW:[id number] DOB: 03-01-1928 DOA: 09/22/2017 PCP: [name], MD   Outpatient Specialists: Dr. Janice Norrie (urology)     Brief Narrative:  Bradley [name] is a 84 y.o. male with medical history significant of hypertension, GERD, varicose vein, PVC, pacemaker placement, CK-4, BPH, CHF with EF 30%, CAD, who presents with syncope and abdominal pain.  Per report, he passed out for about 3 min when he was sitting at the kitchen table about to eat dinner. Per EMS pt did not fall out of his chair and did not hit his head. No seizure activity reported. Patient denies unilateral weakness, numbness or tingling to extremities. No facial droop, slurred speech, vision change or hearing loss. Patient denies chest pain, shortness of breath, cough, fever or chills. Patient states that he has been having abdominal pain the past 4 days, which is located in the R lower quadrant, constant, dull, moderate, nonradiating. Denies nausea, vomiting, diarrhea. No symptoms of UTI. Do not see any bloody urine or stool.     Assessment & Plan:   Principal Problem:   Syncope and collapse Active Problems:   Acute lower UTI   GERD (gastroesophageal reflux disease)   Chronic kidney disease, stage IV (severe) (HCC)   Chronic combined systolic (congestive) and diastolic (congestive) heart failure (HCC)   CAD in native artery   Hypertension   Status post placement of cardiac pacemaker   Abdominal pain   Normocytic anemia   Protein-calorie malnutrition, moderate (HCC)   Syncope and collapse:    -Pacemaker interrogation was performed, which was normal.  -CT-head negative. - Carotid doppler - 2d echo   Abdominal pain: has RLQ abdominal pain, but no nausea, vomiting, diarrhea. No fever or chills. Lipase normal. Etiology is not clear. -CT abdomen/pelvis without contrast: limited due to no contrast but shows: 1. Cholelithiasis. 2. Colonic and distal small bowel diverticulosis.  No bowel obstruction or active inflammation. Normal appendix. 3. Trabeculated urinary bladder with multiple large diverticula. -check PVR  UTI: -rocephin -cultures not done before abx given -will need outpatient urology follow up  GERD: -Protonix  Chronic kidney disease, stage IV (severe) ([place]): Stable. Baseline creatinine 2.9-3.2. His creatinine is 2.98, BUN 48.   Chronic combined systolic (congestive) and diastolic (congestive) heart failure ([place]): 2-D echo on 09/23/14 showed EF of 30-35% with grade 1 diastolic dysfunction. Patient does not have leg edema, JVD. No shortness rest. CHF is compensated. -Hold Lasix due to possible dehydration and syncope -resume in AM  CAD in native artery: no CP -ASA, coreg -prn NTG  HTN:  -Continue home medications: Coreg, hydralazine -IV hydralazine prn  Normocytic anemia: Hemoglobin close to baseline. 9.8 which was a 10.3 at 05/14/16. -outpatient follow up-- prob chronic disease  Protein-calorie malnutrition, moderate ([place]) -nutrition consult      DVT prophylaxis:    Code Status: Full Code   Family Communication:   Disposition Plan:  Home in AM?   Consultants:    Subjective: Feeling better  Objective: Vitals:   09/22/17 2235 09/23/17 0500 09/23/17 0925 09/23/17 1300  BP: (!) 158/71 (!) 148/81 121/61 128/66  Pulse: 66 72 70 62  Resp: 18 18 18 18   Temp: (!) 97.5 F (36.4 C) (!) 97.2 F (36.2 C) 98.5 F (36.9 C) 98.7 F (37.1 C)  TempSrc: Oral Oral Oral Oral  SpO2: 96% 98% 99% 98%  Weight: 82.7 kg (182 lb 4.8 oz) 82.6 kg (182 lb)    Height: 6\' 1"  (1.854 m)  Intake/Output Summary (Last 24 hours) at 09/23/2017 1404 Last data filed at 09/23/2017 1243 Gross per 24 hour  Intake 940 ml  Output 1246 ml  Net -306 ml   Filed Weights   09/22/17 1719 09/22/17 2235 09/23/17 0500  Weight: 95.3 kg (210 lb) 82.7 kg (182 lb 4.8 oz) 82.6 kg (182 lb)    Examination:  General exam: Appears calm and  comfortable  Respiratory system: Clear to auscultation. Respiratory effort normal. Cardiovascular system: S1 & S2 heard, RRR. No JVD, murmurs, rubs, gallops or clicks. No pedal edema. Gastrointestinal system: Abdomen is nondistended, soft and nontender. No organomegaly or masses felt. Normal bowel sounds heard. Central nervous system: Alert and oriented. No focal neurological deficits. Extremities: Symmetric 5 x 5 power. Skin: No rashes, lesions or ulcers Psychiatry: Judgement and insight appear normal. Mood & affect appropriate.     Data Reviewed: I have personally reviewed following labs and imaging studies  CBC: Recent Labs  Lab 09/22/17 1816  WBC 5.2  NEUTROABS 3.6  HGB 9.8*  HCT 32.3*  MCV 89.7  PLT 147   Basic Metabolic Panel: Recent Labs  Lab 09/22/17 1816  NA 135  K 4.7  CL 106  CO2 21*  GLUCOSE 114*  BUN 48*  CREATININE 2.98*  CALCIUM 8.2*   GFR: Estimated Creatinine Clearance: 19.7 mL/min (A) (by C-G formula based on SCr of 2.98 mg/dL (H)). Liver Function Tests: Recent Labs  Lab 09/22/17 1816  AST 14*  ALT 10*  ALKPHOS 62  BILITOT 0.5  PROT 6.0*  ALBUMIN 3.1*   Recent Labs  Lab 09/22/17 1816  LIPASE 39   No results for input(s): AMMONIA in the last 168 hours. Coagulation Profile: No results for input(s): INR, PROTIME in the last 168 hours. Cardiac Enzymes: Recent Labs  Lab 09/22/17 2121  TROPONINI <0.03   BNP (last 3 results) No results for input(s): PROBNP in the last 8760 hours. HbA1C: No results for input(s): HGBA1C in the last 72 hours. CBG: Recent Labs  Lab 09/22/17 1829 09/23/17 0613  GLUCAP 100* 89   Lipid Profile: No results for input(s): CHOL, HDL, LDLCALC, TRIG, CHOLHDL, LDLDIRECT in the last 72 hours. Thyroid Function Tests: No results for input(s): TSH, T4TOTAL, FREET4, T3FREE, THYROIDAB in the last 72 hours. Anemia Panel: No results for input(s): VITAMINB12, FOLATE, FERRITIN, TIBC, IRON, RETICCTPCT in the last 72  hours. Urine analysis:    Component Value Date/Time   COLORURINE YELLOW 09/22/2017 2150   APPEARANCEUR CLOUDY (A) 09/22/2017 2150   LABSPEC 1.010 09/22/2017 2150   PHURINE 6.0 09/22/2017 2150   GLUCOSEU NEGATIVE 09/22/2017 2150   HGBUR NEGATIVE 09/22/2017 2150   BILIRUBINUR NEGATIVE 09/22/2017 2150   KETONESUR NEGATIVE 09/22/2017 2150   PROTEINUR NEGATIVE 09/22/2017 2150   UROBILINOGEN 0.2 09/21/2014 1402   NITRITE NEGATIVE 09/22/2017 2150   LEUKOCYTESUR MODERATE (A) 09/22/2017 2150     ) Recent Results (from the past 240 hour(s))  MRSA PCR Screening     Status: Abnormal   Collection Time: 09/23/17  1:13 AM  Result Value Ref Range Status   MRSA by PCR POSITIVE (A) NEGATIVE Final    Comment:        The GeneXpert MRSA Assay (FDA approved for NASAL specimens only), is one component of a comprehensive MRSA colonization surveillance program. It is not intended to diagnose MRSA infection nor to guide or monitor treatment for MRSA infections. RESULT CALLED TO, READ BACK BY AND VERIFIED WITH: DODOO,E RN 09/23/17 AT 8295 SKEEN,P Performed at  [facility], [place] [address], [place], [place] [address]       Anti-infectives (From admission, onward)   Start     Dose/Rate Route Frequency Ordered Stop   09/23/17 2200  cefTRIAXone (ROCEPHIN) 1 g in sodium chloride 0.9 % 100 mL IVPB     1 g 200 mL/hr over 30 Minutes Intravenous Every 24 hours 09/23/17 0042     09/23/17 0100  cefTRIAXone (ROCEPHIN) 1 g in sodium chloride 0.9 % 100 mL IVPB     1 g 200 mL/hr over 30 Minutes Intravenous  Once 09/23/17 0047 09/23/17 0239       Radiology Studies: Ct Abdomen Pelvis Wo Contrast  Result Date: 09/23/2017 CLINICAL DATA:  84 year old male with right lower quadrant abdominal pain. EXAM: CT ABDOMEN AND PELVIS WITHOUT CONTRAST TECHNIQUE: Multidetector CT imaging of the abdomen and pelvis was performed following the standard protocol without IV contrast. COMPARISON:  Renal ultrasound  dated 09/27/2016 FINDINGS: Evaluation of this exam is limited in the absence of intravenous contrast. Lower chest: Minimal bibasilar linear atelectasis/scarring. The visualized lung bases are otherwise clear. There is coronary vascular calcification and cardiac pacemaker wire. No intra-abdominal free air or free fluid. Hepatobiliary: The liver is unremarkable. No intrahepatic biliary ductal dilatation. Small gallstones. No pericholecystic fluid or evidence of acute cholecystitis by CT. Pancreas: Unremarkable. No pancreatic ductal dilatation or surrounding inflammatory changes. Spleen: Normal in size without focal abnormality. Adrenals/Urinary Tract: Mild bilateral renal parenchyma atrophy. Small bilateral exophytic hypodense lesions are not characterized on this noncontrast CT. There is no hydronephrosis or nephrolithiasis on either side. The visualized ureters are unremarkable. There is trabeculation of the bladder wall with multiple diverticula measuring up to 5 cm from the left lateral bladder wall. Stomach/Bowel: There is no bowel obstruction or active inflammation. There are scattered sigmoid, chronic, and distal small bowel diverticula without active inflammatory changes. Normal appendix. Vascular/Lymphatic: Moderate aortoiliac atherosclerotic disease. No portal venous gas. There is no adenopathy. Reproductive: The prostate and seminal vesicles are grossly unremarkable. No pelvic mass Other: Small fat containing umbilical hernia. Musculoskeletal: Degenerative changes of the spine as well as multilevel disc desiccation. No acute osseous pathology. IMPRESSION: 1. Cholelithiasis. 2. Colonic and distal small bowel diverticulosis. No bowel obstruction or active inflammation. Normal appendix. 3. Trabeculated urinary bladder with multiple large diverticula. Electronically Signed   By: Anner Crete M.D.   On: 09/23/2017 00:08   Ct Head Wo Contrast  Result Date: 09/22/2017 CLINICAL DATA:  Syncope, nausea,  vomiting EXAM: CT HEAD WITHOUT CONTRAST TECHNIQUE: Contiguous axial images were obtained from the base of the skull through the vertex without intravenous contrast. COMPARISON:  09/21/2014 FINDINGS: Brain: Age related volume loss. Mild chronic small vessel disease. No acute intracranial abnormality. Specifically, no hemorrhage, hydrocephalus, mass lesion, acute infarction, or significant intracranial injury. Vascular: No hyperdense vessel or unexpected calcification. Skull: No acute calvarial abnormality. Sinuses/Orbits: Complete opacification of the right maxillary sinus with bulging of the medial wall, new since prior study. Mastoid air cells are clear. Orbital soft tissues unremarkable. Other: None IMPRESSION: No acute intracranial abnormality. Atrophy, chronic microvascular disease. Right maxillary sinus disease as above. Electronically Signed   By: Rolm Baptise M.D.   On: 09/22/2017 19:37   Dg Chest Port 1 View  Result Date: 09/22/2017 CLINICAL DATA:  syncopal episode this afternoon. pt was out for 3 minutes. Pt has a demand pacemaker, per EMS pt had pacemaker placed d/t episodes of syncope in the past. Pt has hx of frequent PVCs.Pt Complains  of epigastric discomfort. No SOBN EXAM: PORTABLE CHEST 1 VIEW COMPARISON:  05/14/2015 FINDINGS: Cardiac silhouette is normal in size. No mediastinal or hilar masses. No evidence of adenopathy. Left anterior chest wall sequential pacemaker is stable, leads projecting over the right atrium and right ventricle. Clear lungs. Stable elevation the right hemidiaphragm. No convincing pleural effusion. No pneumothorax. Skeletal structures are grossly intact. IMPRESSION: No acute cardiopulmonary disease. Electronically Signed   By: Lajean Manes M.D.   On: 09/22/2017 18:50        Scheduled Meds: . aspirin EC  81 mg Oral Daily  . carvedilol  6.25 mg Oral BID WC  . Chlorhexidine Gluconate Cloth  6 each Topical Q0600  . cholecalciferol  1,000 Units Oral BID  .  dorzolamide-timolol  1 drop Both Eyes BID  . feeding supplement (ENSURE ENLIVE)  237 mL Oral BID BM  . heparin  5,000 Units Subcutaneous Q8H  . hydrALAZINE  25 mg Oral BID  . isosorbide mononitrate  60 mg Oral Daily  . latanoprost  1 drop Both Eyes QHS  . multivitamin with minerals  1 tablet Oral Daily  . mupirocin ointment  1 application Nasal BID  . pantoprazole  40 mg Oral Daily  . sodium chloride flush  3 mL Intravenous Q12H   Continuous Infusions: . cefTRIAXone (ROCEPHIN)  IV       LOS: 0 days    Time spent: 35 min    Geradine Girt, DO [facility] Pager [phone]  If 7PM-7AM, please contact night-coverage [email or web address] Password [facility] 09/23/2017, 2:04 PM

## 2017-09-23 NOTE — Progress Notes (Signed)
Carotid duplex prelim: 1-39% ICA stenosis.  Mayling Aber Eunice, RDMS, RVT   

## 2017-09-23 NOTE — Progress Notes (Signed)
Patient arrived to [facility] from ED. Patient alert and oriented no complaints at this time. Admission database completed. CCMD. Notified.

## 2017-09-23 NOTE — Progress Notes (Signed)
  Echocardiogram 2D Echocardiogram has been performed.  Bradley [name] F 09/23/2017, 12:13 PM

## 2017-09-23 NOTE — Evaluation (Signed)
Physical Therapy Evaluation Patient Details Name: Bradley [name] MRN: [id number] DOB: April 23, 1928 Today's Date: 09/23/2017   History of Present Illness   Bradley [name] is a 84 y.o. male with medical history significant of hypertension, GERD, varicose vein, PVC, pacemaker placement, CK-4, BPH, CHF with EF 30%, CAD, who presents with syncope and abdominal pain. Pt passed out for about 3 mins while sitting at the dinner table about to eat.  Clinical Impression  Pt admitted with above diagnosis. Pt currently with functional limitations due to the deficits listed below (see PT Problem List). Pt independent with bed mobility, mildly impulsive with decreased safety awareness OOB. Min guard A for ambulation 80'. Denied any dizziness with mobility, did report continued abdominal discomfort.  Pt will benefit from skilled PT to increase their independence and safety with mobility to allow discharge to the venue listed below.       Follow Up Recommendations No PT follow up    Equipment Recommendations  None recommended by PT    Recommendations for Other Services       Precautions / Restrictions Precautions Precautions: Fall Restrictions Weight Bearing Restrictions: No      Mobility  Bed Mobility Overal bed mobility: Modified Independent                Transfers Overall transfer level: Needs assistance   Transfers: Sit to/from Stand Sit to Stand: Min guard         General transfer comment: stands impulsively with use of tray table despite PT's intructions to wait for RW. No physical assist needed but pt visibly unsteady with initial standing  Ambulation/Gait Ambulation/Gait assistance: Min guard Ambulation Distance (Feet): 80 Feet Assistive device: Rolling walker (2 wheeled) Gait Pattern/deviations: Step-through pattern;Trunk flexed;Wide base of support Gait velocity: decreased Gait velocity interpretation: <1.8 ft/sec, indicative of risk for recurrent falls General  Gait Details: vc's for safety with RW  Stairs            Wheelchair Mobility    Modified Rankin (Stroke Patients Only)       Balance Overall balance assessment: Needs assistance Sitting-balance support: No upper extremity supported Sitting balance-Leahy Scale: Good     Standing balance support: No upper extremity supported Standing balance-Leahy Scale: Fair Standing balance comment: unsteady when standing without UE support                             Pertinent Vitals/Pain Pain Assessment: Faces Faces Pain Scale: Hurts little more Pain Location: abdomen Pain Descriptors / Indicators: Discomfort Pain Intervention(s): Monitored during session    Home Living Family/patient expects to be discharged to:: Private residence Living Arrangements: Spouse/significant other Available Help at Discharge: Family;Available 24 hours/day Type of Home: House Home Access: Stairs to enter Entrance Stairs-Rails: None Entrance Stairs-Number of Steps: 1 Home Layout: Two level Home Equipment: Walker - 2 wheels;Cane - single point Additional Comments: pt does not use AD in home (does ascribe to using the walls), uses cane for community    Prior Function Level of Independence: Needs assistance   Gait / Transfers Assistance Needed: reports independence  ADL's / Homemaking Assistance Needed: at first reports independence but then mentions that wife occasionally helps him with bathing        Hand Dominance        Extremity/Trunk Assessment   Upper Extremity Assessment Upper Extremity Assessment: Overall WFL for tasks assessed    Lower Extremity Assessment Lower Extremity Assessment: RLE  deficits/detail RLE Deficits / Details: has LTKA and reports that the R knee sometimes gives him trouble now but he doesn't know if he can have it replaced.     Cervical / Trunk Assessment Cervical / Trunk Assessment: Kyphotic  Communication   Communication: No difficulties   Cognition Arousal/Alertness: Awake/alert Behavior During Therapy: WFL for tasks assessed/performed Overall Cognitive Status: No family/caregiver present to determine baseline cognitive functioning                                 General Comments: repeated self a few times and needed safety cues with mobility. Expect that this is probably his baseline      General Comments General comments (skin integrity, edema, etc.): VSS    Exercises     Assessment/Plan    PT Assessment Patient needs continued PT services  PT Problem List Decreased mobility;Decreased balance;Decreased safety awareness;Decreased knowledge of precautions;Decreased knowledge of use of DME;Pain       PT Treatment Interventions DME instruction;Gait training;Functional mobility training;Therapeutic activities;Therapeutic exercise;Balance training;Patient/family education    PT Goals (Current goals can be found in the Care Plan section)  Acute Rehab PT Goals Patient Stated Goal: return home PT Goal Formulation: With patient Time For Goal Achievement: 10/07/17 Potential to Achieve Goals: Good    Frequency Min 3X/week   Barriers to discharge        Co-evaluation               AM-PAC PT "6 Clicks" Daily Activity  Outcome Measure Difficulty turning over in bed (including adjusting bedclothes, sheets and blankets)?: None Difficulty moving from lying on back to sitting on the side of the bed? : None Difficulty sitting down on and standing up from a chair with arms (e.g., wheelchair, bedside commode, etc,.)?: A Little Help needed moving to and from a bed to chair (including a wheelchair)?: A Little Help needed walking in hospital room?: A Little Help needed climbing 3-5 steps with a railing? : A Little 6 Click Score: 20    End of Session Equipment Utilized During Treatment: Gait belt Activity Tolerance: Patient tolerated treatment well Patient left: in bed;with bed alarm set;with call  bell/phone within reach Nurse Communication: Mobility status PT Visit Diagnosis: Unsteadiness on feet (R26.81)    Time: 0600-4599 PT Time Calculation (min) (ACUTE ONLY): 20 min   Charges:   PT Evaluation $PT Eval Moderate Complexity: 1 Mod     PT G Codes:        [place]  [place] 09/23/2017, 11:58 AM

## 2017-09-23 NOTE — Progress Notes (Signed)
OT Cancellation Note  Patient Details Name: Bradley [name] MRN: [id number] DOB: 05/20/1928   Cancelled Treatment:    Reason Eval/Treat Not Completed: Other (comment)Pt currently eating lunch.  Almon Register [phone] 09/23/2017, 1:22 PM

## 2017-09-23 NOTE — Evaluation (Signed)
Occupational Therapy Evaluation Patient Details Name: Bradley [name] MRN: [id number] DOB: 18-Jun-1928 Today's Date: 09/23/2017    History of Present Illness  Bradley [name] is a 84 y.o. male with medical history significant of hypertension, GERD, varicose vein, PVC, pacemaker placement, CK-4, BPH, CHF with EF 30%, CAD, who presents with syncope and abdominal pain. Pt passed out for about 3 mins while sitting at the dinner table about to eat.   Clinical Impression   This 84 yo male admitted with above presents to acute OT with decreased balance and decreased safety awareness thus affecting his safety and independence with basic ADLs. He will benefit from acute OT with follow up [place].     Follow Up Recommendations  Home health OT;Supervision/Assistance - 24 hour          Precautions / Restrictions Precautions Precautions: Fall Restrictions Weight Bearing Restrictions: No      Mobility Bed Mobility Overal bed mobility: Modified Independent                Transfers Overall transfer level: Needs assistance Equipment used: Rolling walker (2 wheeled) Transfers: Sit to/from Stand Sit to Stand: Min assist            Balance Overall balance assessment: Needs assistance Sitting-balance support: No upper extremity supported Sitting balance-Leahy Scale: Good     Standing balance support: No upper extremity supported Standing balance-Leahy Scale: Fair Standing balance comment: unsteady when standing without UE support                           ADL either performed or assessed with clinical judgement   ADL Overall ADL's : Needs assistance/impaired Eating/Feeding: Independent;Sitting   Grooming: Set up;Supervision/safety Grooming Details (indicate cue type and reason): Min A for standing Upper Body Bathing: Set up;Supervision/ safety;Sitting   Lower Body Bathing: Set up;Supervison/ safety Lower Body Bathing Details (indicate cue type and reason): min  A for standing Upper Body Dressing : Set up;Supervision/safety   Lower Body Dressing: Supervision/safety;Set up Lower Body Dressing Details (indicate cue type and reason): min A for standing Toilet Transfer: Minimal assistance;Ambulation;Grab bars;Comfort height toilet   Toileting- Clothing Manipulation and Hygiene: Minimal assistance;Sit to/from stand               Vision Patient Visual Report: No change from baseline              Pertinent Vitals/Pain Pain Assessment: Faces Faces Pain Scale: Hurts little more Pain Location: abdomen Pain Descriptors / Indicators: Discomfort Pain Intervention(s): Monitored during session;Repositioned     Hand Dominance Right   Extremity/Trunk Assessment Upper Extremity Assessment Upper Extremity Assessment: LUE deficits/detail LUE Deficits / Details: Decreased AROM of shoulder, but functional     Communication Communication Communication: No difficulties   Cognition Arousal/Alertness: Awake/alert Behavior During Therapy: WFL for tasks assessed/performed Overall Cognitive Status: No family/caregiver present to determine baseline cognitive functioning                                 General Comments: repeated self a few times and needed safety cues with mobility. Expect that this is probably his baseline. furniture/wall walks instead of using AD in house   General Comments  VSS            Home Living Family/patient expects to be discharged to:: Private residence Living Arrangements: Spouse/significant other Available Help at Discharge: Family;Available 24  hours/day Type of Home: House Home Access: Stairs to enter [employer] of Steps: 1 Entrance Stairs-Rails: None Home Layout: Two level Alternate Level Stairs-Number of Steps: has chair lift   Bathroom Shower/Tub: Tub/shower unit(sits on side of tub to bath)   Bathroom Toilet: Standard     Home Equipment: Environmental consultant - 2 wheels;Cane - single  point   Additional Comments: pt does not use AD in home (does ascribe to using the walls), uses cane for community      Prior Functioning/Environment Level of Independence: Needs assistance  Gait / Transfers Assistance Needed: reports independence ADL's / Homemaking Assistance Needed: at first reports independence but then mentions that wife occasionally helps him with bathing            OT Problem List: Decreased strength;Impaired balance (sitting and/or standing);Decreased safety awareness      OT Treatment/Interventions: Self-care/ADL training;Balance training;DME and/or AE instruction;Patient/family education    OT Goals(Current goals can be found in the care plan section) Acute Rehab OT Goals Patient Stated Goal: return home OT Goal Formulation: With patient Time For Goal Achievement: 10/07/17 Potential to Achieve Goals: Good  OT Frequency: Min 2X/week              AM-PAC PT "6 Clicks" Daily Activity     Outcome Measure Help from another person eating meals?: None Help from another person taking care of personal grooming?: A Little Help from another person toileting, which includes using toliet, bedpan, or urinal?: A Little Help from another person bathing (including washing, rinsing, drying)?: A Little Help from another person to put on and taking off regular upper body clothing?: A Little Help from another person to put on and taking off regular lower body clothing?: A Little 6 Click Score: 19   End of Session Equipment Utilized During Treatment: Gait belt  Activity Tolerance: Patient tolerated treatment well Patient left: in bed;with call bell/phone within reach;with bed alarm set  OT Visit Diagnosis: Unsteadiness on feet (R26.81);History of falling (Z91.81);Other abnormalities of gait and mobility (R26.89)                Time: 1660-6301 OT Time Calculation (min): 18 min Charges:  OT General Charges $OT Visit: 1 Visit OT Evaluation $OT Eval Moderate  Complexity: [address] [place] [phone] 09/23/2017

## 2017-09-23 NOTE — Progress Notes (Signed)
Initial Nutrition Assessment  DOCUMENTATION CODES:   Non-severe (moderate) malnutrition in context of chronic illness  INTERVENTION:   -Ensure Enlive po BID, each supplement provides 350 kcal and 20 grams of protein -Downgrade diet to dysphagia 2 (mechanical soft), due to chewing difficulty for ease of intake  NUTRITION DIAGNOSIS:   Moderate Malnutrition related to chronic illness(CHF) as evidenced by energy intake < 75% for > or equal to 1 month, mild fat depletion, moderate fat depletion, mild muscle depletion, moderate muscle depletion.  GOAL:   Patient will meet greater than or equal to 90% of their needs  MONITOR:   PO intake, Supplement acceptance, Labs, Weight trends, Skin, I & O's  REASON FOR ASSESSMENT:   Consult Assessment of nutrition requirement/status  ASSESSMENT:   Bradley [name] is a 84 y.o. male with medical history significant of hypertension, GERD, varicose vein, PVC, pacemaker placement, CK-4, BPH, CHF with EF 30%, CAD, who presents with syncope and abdominal pain.  Pt admitted with syncope and abdominal pain.  Case discussed with RN, who reports pt is eating well and has a good appetite. However, pt with difficulty chewing some foods related to lack of dentures.   Spoke with pt at bedside, who reports feeling better today. He was consuming lunch at time of visit. He reports he has to consume small bites of food related to lack of dentures. He denies difficulty chewing when he has his dentures at home. He reports he consumes 2 meals per day PTA; his wife prepares him food. Meals consist of meat, starch, and vegetables. Pt also reports snacking on sodas, milk, and juices. He does not consume supplements at home, however, amenable to try them during hospitalization.   Pt endorses wt loss, however, unsure of amount or when wt loss started. He suspects he has lost weight gradually due to consuming small meals and decreased appetite recently "due to feeling  bad". Suspect pt was not meeting >75% of estimated nutritional needs PTA based upon diet recall. Pt has experienced a 6.2% wt loss x 6 months. While this is not significant, it is concerning when coupled with decreased appetite.   Discussed importance of good meal and supplement intake to promote healing.  Labs reviewed: CBGS: 89.  NUTRITION - FOCUSED PHYSICAL EXAM:    Most Recent Value  Orbital Region  Moderate depletion  Upper Arm Region  Mild depletion  Thoracic and Lumbar Region  Mild depletion  Buccal Region  No depletion  Temple Region  Moderate depletion  Clavicle Bone Region  No depletion  Clavicle and Acromion Bone Region  No depletion  Scapular Bone Region  No depletion  Dorsal Hand  Moderate depletion  Patellar Region  Mild depletion  Anterior Thigh Region  Mild depletion  Posterior Calf Region  Mild depletion  Edema (RD Assessment)  None  Hair  Reviewed  Eyes  Reviewed  Mouth  Reviewed  Skin  Reviewed  Nails  Reviewed       Diet Order:  DIET DYS 2 Room service appropriate? Yes; Fluid consistency: Thin  EDUCATION NEEDS:   Education needs have been addressed  Skin:  Skin Assessment: Skin Integrity Issues: Skin Integrity Issues:: Incisions Incisions: lt knee incision  Last BM:  09/22/17  Height:   Ht Readings from Last 1 Encounters:  09/22/17 6\' 1"  (1.854 m)    Weight:   Wt Readings from Last 1 Encounters:  09/23/17 182 lb (82.6 kg)    Ideal Body Weight:  83.6 kg  BMI:  Body mass index is 24.01 kg/m.  Estimated Nutritional Needs:   Kcal:  1800-2000  Protein:  90-105 grams  Fluid:  1.8-2.0 L    Jezreel Sisk A. Jimmye Norman, RD, LDN, CDE Pager: [phone] After hours Pager: [phone]

## 2017-09-24 DIAGNOSIS — R55 Syncope and collapse: Secondary | ICD-10-CM | POA: Diagnosis not present

## 2017-09-24 DIAGNOSIS — N184 Chronic kidney disease, stage 4 (severe): Secondary | ICD-10-CM | POA: Diagnosis not present

## 2017-09-24 DIAGNOSIS — I10 Essential (primary) hypertension: Secondary | ICD-10-CM | POA: Diagnosis not present

## 2017-09-24 LAB — CBC
HCT: 30.6 % — ABNORMAL LOW (ref 39.0–52.0)
HEMOGLOBIN: 9.6 g/dL — AB (ref 13.0–17.0)
MCH: 28.6 pg (ref 26.0–34.0)
MCHC: 31.4 g/dL (ref 30.0–36.0)
MCV: 91.1 fL (ref 78.0–100.0)
PLATELETS: 177 10*3/uL (ref 150–400)
RBC: 3.36 MIL/uL — AB (ref 4.22–5.81)
RDW: 13.5 % (ref 11.5–15.5)
WBC: 5.5 10*3/uL (ref 4.0–10.5)

## 2017-09-24 LAB — GLUCOSE, CAPILLARY
GLUCOSE-CAPILLARY: 88 mg/dL (ref 65–99)
Glucose-Capillary: 81 mg/dL (ref 65–99)

## 2017-09-24 LAB — BASIC METABOLIC PANEL
ANION GAP: 11 (ref 5–15)
BUN: 46 mg/dL — ABNORMAL HIGH (ref 6–20)
CO2: 20 mmol/L — ABNORMAL LOW (ref 22–32)
Calcium: 8.4 mg/dL — ABNORMAL LOW (ref 8.9–10.3)
Chloride: 106 mmol/L (ref 101–111)
Creatinine, Ser: 2.75 mg/dL — ABNORMAL HIGH (ref 0.61–1.24)
GFR calc Af Amer: 22 mL/min — ABNORMAL LOW (ref >60)
GFR, EST NON AFRICAN AMERICAN: 19 mL/min — AB (ref >60)
Glucose, Bld: 112 mg/dL — ABNORMAL HIGH (ref 65–99)
POTASSIUM: 4.7 mmol/L (ref 3.5–5.1)
Sodium: 137 mmol/L (ref 135–145)

## 2017-09-24 LAB — URINE CULTURE

## 2017-09-24 MED ORDER — CEFUROXIME AXETIL 500 MG PO TABS
250.0000 mg | ORAL_TABLET | Freq: Two times a day (BID) | ORAL | Status: DC
  Administered 2017-09-24: 250 mg via ORAL
  Filled 2017-09-24: qty 1

## 2017-09-24 MED ORDER — CEFUROXIME AXETIL 250 MG PO TABS: 250.0000 mg | ORAL_TABLET | Freq: Every day | ORAL | 0 refills | Status: DC

## 2017-09-24 MED ORDER — CEFUROXIME AXETIL 500 MG PO TABS: 250.0000 mg | ORAL_TABLET | Freq: Every day | ORAL | Status: DC

## 2017-09-24 MED ORDER — CEFUROXIME AXETIL 250 MG PO TABS: 250.0000 mg | ORAL_TABLET | Freq: Two times a day (BID) | ORAL | 0 refills | Status: DC

## 2017-09-24 NOTE — Care Management Obs Status (Signed)
[facility] NOTIFICATION   Patient Details  Name: Bradley [name] MRN: [id number] Date of Birth: 1928/03/10   Medicare Observation Status Notification Given:  Yes    Carles Collet, RN 09/24/2017, 9:12 AM

## 2017-09-24 NOTE — Progress Notes (Signed)
Pt was discharged assisted by the NT per wheelchair. [facility] and questions answered. Per daughter, all the pt's belongings was intact.

## 2017-09-24 NOTE — Progress Notes (Signed)
Physical Therapy Treatment Patient Details Name: Bradley [name] MRN: [id number] DOB: [birth year] Today's Date: 09/24/2017    History of Present Illness Pt is a 84 y.o. male who presents 09/22/17 with syncope and abdominal pain; had passed out for about 3 mins while sitting at the dinner table about to eat; etiology unclear. Pacemaker interrogation normal. CT clear. PMH significant of HTN, PVC, pacemaker placement, CKD IV, CHF with EF 30%, CAD.   PT Comments    Pt progressing with mobility. Able to amb 250' with RW and supervision for safety; intermittent cues for safe navigation with RW as pt occasionally running wheel into object. 1x standing rest break secondary to SOB. Discussed recommendation that pt use RW when ambulating at home; pt states he owns one already. Will continue to follow acutely.   Follow Up Recommendations  No PT follow up;Supervision for mobility/OOB     Equipment Recommendations  None recommended by PT(owns DME)    Recommendations for Other Services       Precautions / Restrictions Precautions Precautions: Fall Restrictions Weight Bearing Restrictions: No    Mobility  Bed Mobility Overal bed mobility: Modified Independent             General bed mobility comments: Increased time and effort  Transfers Overall transfer level: Needs assistance Equipment used: Rolling walker (2 wheeled) Transfers: Sit to/from Stand Sit to Stand: Supervision            Ambulation/Gait Ambulation/Gait assistance: Supervision Ambulation Distance (Feet): 250 Feet Assistive device: Rolling walker (2 wheeled) Gait Pattern/deviations: Step-through pattern;Decreased stride length;Trunk flexed Gait velocity: Decreased Gait velocity interpretation: <1.8 ft/sec, indicative of risk for recurrent falls General Gait Details: Supervision for safety. 1x standing rest break secondary to SOB while talking and walking   Stairs            Wheelchair Mobility     Modified Rankin (Stroke Patients Only)       Balance Overall balance assessment: Needs assistance Sitting-balance support: No upper extremity supported Sitting balance-Leahy Scale: Good     Standing balance support: No upper extremity supported Standing balance-Leahy Scale: Fair Standing balance comment: Able to static stand with no UE support; stability much improved with BUE support                            Cognition Arousal/Alertness: Awake/alert Behavior During Therapy: WFL for tasks assessed/performed Overall Cognitive Status: No family/caregiver present to determine baseline cognitive functioning                                 General Comments: Repeated himself a few times throughout session; intermittent cues for safety while ambulating (expect this is probably pt's baseline)      Exercises      General Comments        Pertinent Vitals/Pain Pain Assessment: No/denies pain    Home Living                      Prior Function            PT Goals (current goals can now be found in the care plan section) Acute Rehab PT Goals Patient Stated Goal: return home PT Goal Formulation: With patient Time For Goal Achievement: 10/07/17 Potential to Achieve Goals: Good Progress towards PT goals: Progressing toward goals    Frequency    Min 3X/week  PT Plan Current plan remains appropriate    Co-evaluation              AM-PAC PT "6 Clicks" Daily Activity  Outcome Measure  Difficulty turning over in bed (including adjusting bedclothes, sheets and blankets)?: None Difficulty moving from lying on back to sitting on the side of the bed? : None Difficulty sitting down on and standing up from a chair with arms (e.g., wheelchair, bedside commode, etc,.)?: None Help needed moving to and from a bed to chair (including a wheelchair)?: A Little Help needed walking in hospital room?: A Little Help needed climbing 3-5  steps with a railing? : A Little 6 Click Score: 21    End of Session Equipment Utilized During Treatment: Gait belt Activity Tolerance: Patient tolerated treatment well Patient left: in chair;with call bell/phone within reach;with chair alarm set Nurse Communication: Mobility status PT Visit Diagnosis: Unsteadiness on feet (R26.81)     Time: 6415-8309 PT Time Calculation (min) (ACUTE ONLY): 21 min  Charges:  $Gait Training: 8-22 mins                    G Codes:      Mabeline Caras, PT, DPT Acute Rehab Services  Pager: [facility] 09/24/2017, 11:45 AM

## 2017-09-25 LAB — URINE CULTURE

## 2017-09-25 NOTE — Discharge Summary (Signed)
Physician Discharge Summary  Bradley [name] NUU:[id number] DOB: August 13, 1927 DOA: 09/22/2017  PCP: [name], MD  Admit date: 09/22/2017 Discharge date: 09/25/2017   Recommendations for Outpatient Follow-Up:   1. Home health PT 2. Urology follow up for bladder   Discharge Diagnosis:   Principal Problem:   Syncope and collapse Active Problems:   Acute lower UTI   GERD (gastroesophageal reflux disease)   Chronic kidney disease, stage IV (severe) (HCC)   Chronic combined systolic (congestive) and diastolic (congestive) heart failure (HCC)   CAD in native artery   Hypertension   Status post placement of cardiac pacemaker   Abdominal pain   Normocytic anemia   Protein-calorie malnutrition, moderate ([place])   Discharge disposition:  Home.  Discharge Condition: Improved.  Diet recommendation: Low sodium, heart healthy.  Wound care: None.   History of Present Illness:   Bradley [name] is a 84 y.o. male with medical history significant of hypertension, GERD, varicose vein, PVC, pacemaker placement, CK-4, BPH, CHF with EF 30%, CAD, who presents with syncope and abdominal pain.  Per report, he passed out for about 3 min when he was sitting at the kitchen table about to eat dinner. Per EMS pt did not fall out of his chair and did not hit his head. No seizure activity reported. Patient denies unilateral weakness, numbness or tingling to extremities. No facial droop, slurred speech, vision change or hearing loss. Patient denies chest pain, shortness of breath, cough, fever or chills. Patient states that he has been having abdominal pain the past 4 days, which is located in the R lower quadrant, constant, dull, moderate, nonradiating. Denies nausea, vomiting, diarrhea. No symptoms of UTI. Do not see any bloody urine or stool.     Hospital Course by Problem:   Syncope and collapse: -Pacemaker interrogation was performed, which was normal.  -CT-head negative. - Carotid  doppler normal -2d echo: similar to prior  Abdominal pain: has RLQabdominal pain, but no nausea, vomiting, diarrhea. No fever or chills. Lipase normal. Etiology is not clear. -CT abdomen/pelvis without contrast: limited due to no contrast but shows: 1. Cholelithiasis. 2. Colonic and distal small bowel diverticulosis. No bowel obstruction or active inflammation. Normal appendix. 3. Trabeculated urinary bladder with multiple large diverticula. -normal PVR  UTI: -PO abx -cultures not done before abx given -will need outpatient urology follow up  GERD: -Protonix  Chronic kidney disease, stage IV (severe) (HCC):Stable. Baseline creatinine 2.9-3.2. His creatinine is 2.98, BUN 48.   Chronic combined systolic (congestive) and diastolic (congestive) heart failure (HCC):2-D echo on 09/23/14 showed EF of 30-35% with grade 1 diastolic dysfunction. Patient does not have leg edema, JVD. No shortness rest. CHF is compensated. -resume lasix at lower dose  CAD in native artery: no CP -ASA, coreg -prn NTG  HTN:  -Continue home medications:Coreg, hydralazine -IV hydralazine prn  Normocytic anemia:Hemoglobin close to baseline. 9.8 which was a 10.3 at 05/14/16. -outpatient follow up-- prob chronic disease  Protein-calorie malnutrition, moderate ([place]) Malnutrition Type:  Nutrition Problem: Moderate Malnutrition Etiology: chronic illness(CHF)   Malnutrition Characteristics:  Signs/Symptoms: energy intake < 75% for > or equal to 1 month, mild fat depletion, moderate fat depletion, mild muscle depletion, moderate muscle depletion   Nutrition Interventions:  Interventions: Ensure Enlive (each supplement provides 350kcal and 20 grams of protein), Refer to RD note for recommendations        Medical Consultants:    None.   Discharge Exam:   Vitals:   09/24/17 0800 09/24/17 1202  BP: (!) 135/56 115/65  Pulse: 63 91  Resp:  16  Temp:  98.7 F (37.1 C)  SpO2:   91%   Vitals:   09/23/17 2100 09/24/17 0429 09/24/17 0800 09/24/17 1202  BP: 138/72 132/76 (!) 135/56 115/65  Pulse: 67 66 63 91  Resp: 20 18  16   Temp: 98.7 F (37.1 C) 98 F (36.7 C)  98.7 F (37.1 C)  TempSrc: Oral Oral  Oral  SpO2: 98% 98%  91%  Weight:  81.7 kg (180 lb 3.2 oz)    Height:        Gen:  NAD-- feeling much better and asking to go home   The results of significant diagnostics from this hospitalization (including imaging, microbiology, ancillary and laboratory) are listed below for reference.     Procedures and Diagnostic Studies:   Ct Abdomen Pelvis Wo Contrast  Result Date: 09/23/2017 CLINICAL DATA:  84 year old male with right lower quadrant abdominal pain. EXAM: CT ABDOMEN AND PELVIS WITHOUT CONTRAST TECHNIQUE: Multidetector CT imaging of the abdomen and pelvis was performed following the standard protocol without IV contrast. COMPARISON:  Renal ultrasound dated 09/27/2016 FINDINGS: Evaluation of this exam is limited in the absence of intravenous contrast. Lower chest: Minimal bibasilar linear atelectasis/scarring. The visualized lung bases are otherwise clear. There is coronary vascular calcification and cardiac pacemaker wire. No intra-abdominal free air or free fluid. Hepatobiliary: The liver is unremarkable. No intrahepatic biliary ductal dilatation. Small gallstones. No pericholecystic fluid or evidence of acute cholecystitis by CT. Pancreas: Unremarkable. No pancreatic ductal dilatation or surrounding inflammatory changes. Spleen: Normal in size without focal abnormality. Adrenals/Urinary Tract: Mild bilateral renal parenchyma atrophy. Small bilateral exophytic hypodense lesions are not characterized on this noncontrast CT. There is no hydronephrosis or nephrolithiasis on either side. The visualized ureters are unremarkable. There is trabeculation of the bladder wall with multiple diverticula measuring up to 5 cm from the left lateral bladder wall. Stomach/Bowel:  There is no bowel obstruction or active inflammation. There are scattered sigmoid, chronic, and distal small bowel diverticula without active inflammatory changes. Normal appendix. Vascular/Lymphatic: Moderate aortoiliac atherosclerotic disease. No portal venous gas. There is no adenopathy. Reproductive: The prostate and seminal vesicles are grossly unremarkable. No pelvic mass Other: Small fat containing umbilical hernia. Musculoskeletal: Degenerative changes of the spine as well as multilevel disc desiccation. No acute osseous pathology. IMPRESSION: 1. Cholelithiasis. 2. Colonic and distal small bowel diverticulosis. No bowel obstruction or active inflammation. Normal appendix. 3. Trabeculated urinary bladder with multiple large diverticula. Electronically Signed   By: Anner Crete M.D.   On: 09/23/2017 00:08   Ct Head Wo Contrast  Result Date: 09/22/2017 CLINICAL DATA:  Syncope, nausea, vomiting EXAM: CT HEAD WITHOUT CONTRAST TECHNIQUE: Contiguous axial images were obtained from the base of the skull through the vertex without intravenous contrast. COMPARISON:  09/21/2014 FINDINGS: Brain: Age related volume loss. Mild chronic small vessel disease. No acute intracranial abnormality. Specifically, no hemorrhage, hydrocephalus, mass lesion, acute infarction, or significant intracranial injury. Vascular: No hyperdense vessel or unexpected calcification. Skull: No acute calvarial abnormality. Sinuses/Orbits: Complete opacification of the right maxillary sinus with bulging of the medial wall, new since prior study. Mastoid air cells are clear. Orbital soft tissues unremarkable. Other: None IMPRESSION: No acute intracranial abnormality. Atrophy, chronic microvascular disease. Right maxillary sinus disease as above. Electronically Signed   By: Rolm Baptise M.D.   On: 09/22/2017 19:37   Dg Chest Port 1 View  Result Date: 09/22/2017 CLINICAL DATA:  syncopal episode this  afternoon. pt was out for 3 minutes. Pt  has a demand pacemaker, per EMS pt had pacemaker placed d/t episodes of syncope in the past. Pt has hx of frequent PVCs.Pt Complains of epigastric discomfort. No SOBN EXAM: PORTABLE CHEST 1 VIEW COMPARISON:  05/14/2015 FINDINGS: Cardiac silhouette is normal in size. No mediastinal or hilar masses. No evidence of adenopathy. Left anterior chest wall sequential pacemaker is stable, leads projecting over the right atrium and right ventricle. Clear lungs. Stable elevation the right hemidiaphragm. No convincing pleural effusion. No pneumothorax. Skeletal structures are grossly intact. IMPRESSION: No acute cardiopulmonary disease. Electronically Signed   By: Lajean Manes M.D.   On: 09/22/2017 18:50     Labs:   Basic Metabolic Panel: Recent Labs  Lab 09/22/17 1816 09/24/17 0908  NA 135 137  K 4.7 4.7  CL 106 106  CO2 21* 20*  GLUCOSE 114* 112*  BUN 48* 46*  CREATININE 2.98* 2.75*  CALCIUM 8.2* 8.4*   GFR Estimated Creatinine Clearance: 21.4 mL/min (A) (by C-G formula based on SCr of 2.75 mg/dL (H)). Liver Function Tests: Recent Labs  Lab 09/22/17 1816  AST 14*  ALT 10*  ALKPHOS 62  BILITOT 0.5  PROT 6.0*  ALBUMIN 3.1*   Recent Labs  Lab 09/22/17 1816  LIPASE 39   No results for input(s): AMMONIA in the last 168 hours. Coagulation profile No results for input(s): INR, PROTIME in the last 168 hours.  CBC: Recent Labs  Lab 09/22/17 1816 09/24/17 0908  WBC 5.2 5.5  NEUTROABS 3.6  --   HGB 9.8* 9.6*  HCT 32.3* 30.6*  MCV 89.7 91.1  PLT 188 177   Cardiac Enzymes: Recent Labs  Lab 09/22/17 2121  TROPONINI <0.03   BNP: Invalid input(s): POCBNP CBG: Recent Labs  Lab 09/22/17 1829 09/23/17 0613 09/24/17 0609 09/24/17 0739  GLUCAP 100* 89 88 81   D-Dimer No results for input(s): DDIMER in the last 72 hours. Hgb A1c No results for input(s): HGBA1C in the last 72 hours. Lipid Profile No results for input(s): CHOL, HDL, LDLCALC, TRIG, CHOLHDL, LDLDIRECT in the  last 72 hours. Thyroid function studies No results for input(s): TSH, T4TOTAL, T3FREE, THYROIDAB in the last 72 hours.  Invalid input(s): FREET3 Anemia work up No results for input(s): VITAMINB12, FOLATE, FERRITIN, TIBC, IRON, RETICCTPCT in the last 72 hours. Microbiology Recent Results (from the past 240 hour(s))  MRSA PCR Screening     Status: Abnormal   Collection Time: 09/23/17  1:13 AM  Result Value Ref Range Status   MRSA by PCR POSITIVE (A) NEGATIVE Final    Comment:        The GeneXpert MRSA Assay (FDA approved for NASAL specimens only), is one component of a comprehensive MRSA colonization surveillance program. It is not intended to diagnose MRSA infection nor to guide or monitor treatment for MRSA infections. RESULT CALLED TO, READ BACK BY AND VERIFIED WITH: DODOO,E RN 09/23/17 AT 9476 SKEEN,P Performed at [facility], [place] [address], [place], [place] [address]   Culture, blood (Routine X 2) w Reflex to ID Panel     Status: None (Preliminary result)   Collection Time: 09/23/17  2:02 AM  Result Value Ref Range Status   Specimen Description BLOOD RIGHT ANTECUBITAL  Final   Special Requests IN PEDIATRIC BOTTLE Blood Culture adequate volume  Final   Culture   Final    NO GROWTH 2 DAYS Performed at [facility], [place],  [place] [address]    Report Status PENDING  Incomplete  Culture, blood (Routine X 2) w Reflex to ID Panel     Status: None (Preliminary result)   Collection Time: 09/23/17  2:02 AM  Result Value Ref Range Status   Specimen Description BLOOD RIGHT ANTECUBITAL  Final   Special Requests IN PEDIATRIC BOTTLE Blood Culture adequate volume  Final   Culture   Final    NO GROWTH 2 DAYS Performed at [facility], [place] [address] [place], [place] [address]    Report Status PENDING  Incomplete  Culture, Urine     Status: Abnormal   Collection Time: 09/23/17 12:56 PM  Result Value Ref Range Status   Specimen Description  URINE, CLEAN CATCH  Final   Special Requests   Final    NONE Performed at [facility], [place] [address] [place], [place] [address]    Culture MULTIPLE SPECIES PRESENT, SUGGEST RECOLLECTION (A)  Final   Report Status 09/24/2017 FINAL  Final  Culture, Urine     Status: Abnormal   Collection Time: 09/24/17 12:34 AM  Result Value Ref Range Status   Specimen Description URINE, CLEAN CATCH  Final   Special Requests   Final    NONE Performed at [facility], [place] [address] [place], [place] [address]    Culture MULTIPLE SPECIES PRESENT, SUGGEST RECOLLECTION (A)  Final   Report Status 09/25/2017 FINAL  Final     Discharge Instructions:   Discharge Instructions    Discharge instructions   Complete by:  As directed    DYS 2   Increase activity slowly   Complete by:  As directed      Allergies as of 09/24/2017   No Known Allergies     Medication List    TAKE these medications   acetaminophen 500 MG tablet Commonly known as:  TYLENOL Take 500 mg by mouth at bedtime as needed for headache (pain).   aspirin EC 81 MG tablet Take 1 tablet (81 mg total) by mouth daily.   carvedilol 6.25 MG tablet Commonly known as:  COREG TAKE 1 TABLET (6.25 MG TOTAL) BY MOUTH 2 (TWO) TIMES DAILY.   cefUROXime 250 MG tablet Commonly known as:  CEFTIN Take 1 tablet (250 mg total) by mouth 2 (two) times daily with a meal.   cholecalciferol 1000 units tablet Commonly known as:  VITAMIN D Take 1,000 Units by mouth 2 (two) times daily.   docusate sodium 100 MG capsule Commonly known as:  COLACE Take 100 mg by mouth 2 (two) times daily as needed for mild constipation.   dorzolamide-timolol 22.3-6.8 MG/ML ophthalmic solution Commonly known as:  COSOPT Place 1 drop into both eyes 2 (two) times daily.   furosemide 40 MG tablet Commonly known as:  LASIX Take 1 tablet (40 mg total) by mouth daily. What changed:  how much to take   hydrALAZINE 25 MG tablet Commonly known as:   APRESOLINE TAKE 1 TABLET (25 MG TOTAL) BY MOUTH 2 (TWO) TIMES DAILY.   isosorbide mononitrate 60 MG 24 hr tablet Commonly known as:  IMDUR Take 60 mg by mouth daily.   latanoprost 0.005 % ophthalmic solution Commonly known as:  XALATAN Place 1 drop into both eyes at bedtime.   multivitamin with minerals Tabs tablet Take 1 tablet by mouth daily.   nitroGLYCERIN 0.4 MG SL tablet Commonly known as:  NITROSTAT Place 0.4 mg under the tongue every 5 (five) minutes as needed  for chest pain (max 3 doses within 15 minutes call 911).   omeprazole 20 MG capsule Commonly known as:  PRILOSEC Take 40 mg by mouth daily.      Follow-up Information    [name], MD. Go on 10/03/2017.   Specialty:  Internal Medicine Why:  @11 :15am Contact information: [address] [place] [address] [phone]        [facility]-former patient of Dr. Janice Norrie Follow up.        Madelon Lips, MD Follow up in 3 week(s).   Specialty:  Nephrology Contact information: [address] [place]  [address] [phone]            Time coordinating discharge: 35 min  Signed:  Geradine Girt   [facility] 09/25/2017, 4:57 PM

## 2017-09-28 LAB — CULTURE, BLOOD (ROUTINE X 2)
CULTURE: NO GROWTH
Culture: NO GROWTH
Special Requests: ADEQUATE
Special Requests: ADEQUATE

## 2017-10-03 ENCOUNTER — Other Ambulatory Visit: Payer: Self-pay

## 2017-10-03 ENCOUNTER — Emergency Department (HOSPITAL_COMMUNITY): Payer: Medicare Other

## 2017-10-03 ENCOUNTER — Inpatient Hospital Stay (HOSPITAL_COMMUNITY)
Admission: EM | Admit: 2017-10-03 | Discharge: 2017-10-10 | DRG: 981 | Disposition: A | Discharge status: Skilled Nursing Facility | Payer: Medicare Other | Attending: Internal Medicine | Admitting: Internal Medicine

## 2017-10-03 ENCOUNTER — Encounter (HOSPITAL_COMMUNITY): Payer: Self-pay

## 2017-10-03 DIAGNOSIS — Z7982 Long term (current) use of aspirin: Secondary | ICD-10-CM

## 2017-10-03 DIAGNOSIS — I5022 Chronic systolic (congestive) heart failure: Secondary | ICD-10-CM | POA: Diagnosis present

## 2017-10-03 DIAGNOSIS — N179 Acute kidney failure, unspecified: Secondary | ICD-10-CM | POA: Diagnosis present

## 2017-10-03 DIAGNOSIS — R748 Abnormal levels of other serum enzymes: Secondary | ICD-10-CM | POA: Diagnosis not present

## 2017-10-03 DIAGNOSIS — I5042 Chronic combined systolic (congestive) and diastolic (congestive) heart failure: Secondary | ICD-10-CM | POA: Diagnosis not present

## 2017-10-03 DIAGNOSIS — K920 Hematemesis: Secondary | ICD-10-CM | POA: Diagnosis not present

## 2017-10-03 DIAGNOSIS — E872 Acidosis: Secondary | ICD-10-CM | POA: Diagnosis present

## 2017-10-03 DIAGNOSIS — Z950 Presence of cardiac pacemaker: Secondary | ICD-10-CM | POA: Diagnosis not present

## 2017-10-03 DIAGNOSIS — Z96652 Presence of left artificial knee joint: Secondary | ICD-10-CM | POA: Diagnosis present

## 2017-10-03 DIAGNOSIS — N184 Chronic kidney disease, stage 4 (severe): Secondary | ICD-10-CM | POA: Diagnosis present

## 2017-10-03 DIAGNOSIS — I959 Hypotension, unspecified: Secondary | ICD-10-CM | POA: Diagnosis present

## 2017-10-03 DIAGNOSIS — D72829 Elevated white blood cell count, unspecified: Secondary | ICD-10-CM | POA: Diagnosis present

## 2017-10-03 DIAGNOSIS — K449 Diaphragmatic hernia without obstruction or gangrene: Secondary | ICD-10-CM | POA: Diagnosis present

## 2017-10-03 DIAGNOSIS — D62 Acute posthemorrhagic anemia: Secondary | ICD-10-CM | POA: Diagnosis present

## 2017-10-03 DIAGNOSIS — Z8249 Family history of ischemic heart disease and other diseases of the circulatory system: Secondary | ICD-10-CM

## 2017-10-03 DIAGNOSIS — E860 Dehydration: Secondary | ICD-10-CM | POA: Diagnosis present

## 2017-10-03 DIAGNOSIS — K922 Gastrointestinal hemorrhage, unspecified: Secondary | ICD-10-CM

## 2017-10-03 DIAGNOSIS — E875 Hyperkalemia: Secondary | ICD-10-CM | POA: Diagnosis present

## 2017-10-03 DIAGNOSIS — K219 Gastro-esophageal reflux disease without esophagitis: Secondary | ICD-10-CM | POA: Diagnosis present

## 2017-10-03 DIAGNOSIS — E861 Hypovolemia: Secondary | ICD-10-CM | POA: Diagnosis present

## 2017-10-03 DIAGNOSIS — K2210 Ulcer of esophagus without bleeding: Secondary | ICD-10-CM | POA: Diagnosis present

## 2017-10-03 DIAGNOSIS — I2510 Atherosclerotic heart disease of native coronary artery without angina pectoris: Secondary | ICD-10-CM | POA: Diagnosis present

## 2017-10-03 DIAGNOSIS — E440 Moderate protein-calorie malnutrition: Secondary | ICD-10-CM | POA: Diagnosis present

## 2017-10-03 DIAGNOSIS — K222 Esophageal obstruction: Secondary | ICD-10-CM | POA: Diagnosis present

## 2017-10-03 DIAGNOSIS — R296 Repeated falls: Secondary | ICD-10-CM | POA: Diagnosis present

## 2017-10-03 DIAGNOSIS — I429 Cardiomyopathy, unspecified: Secondary | ICD-10-CM | POA: Diagnosis present

## 2017-10-03 DIAGNOSIS — N400 Enlarged prostate without lower urinary tract symptoms: Secondary | ICD-10-CM | POA: Diagnosis present

## 2017-10-03 DIAGNOSIS — R001 Bradycardia, unspecified: Secondary | ICD-10-CM | POA: Diagnosis present

## 2017-10-03 DIAGNOSIS — I209 Angina pectoris, unspecified: Secondary | ICD-10-CM

## 2017-10-03 DIAGNOSIS — M170 Bilateral primary osteoarthritis of knee: Secondary | ICD-10-CM | POA: Diagnosis present

## 2017-10-03 DIAGNOSIS — I214 Non-ST elevation (NSTEMI) myocardial infarction: Secondary | ICD-10-CM | POA: Diagnosis not present

## 2017-10-03 DIAGNOSIS — K921 Melena: Secondary | ICD-10-CM

## 2017-10-03 DIAGNOSIS — D649 Anemia, unspecified: Secondary | ICD-10-CM | POA: Diagnosis not present

## 2017-10-03 DIAGNOSIS — I252 Old myocardial infarction: Secondary | ICD-10-CM | POA: Diagnosis not present

## 2017-10-03 DIAGNOSIS — I130 Hypertensive heart and chronic kidney disease with heart failure and stage 1 through stage 4 chronic kidney disease, or unspecified chronic kidney disease: Secondary | ICD-10-CM | POA: Diagnosis present

## 2017-10-03 DIAGNOSIS — R109 Unspecified abdominal pain: Secondary | ICD-10-CM | POA: Diagnosis present

## 2017-10-03 DIAGNOSIS — K260 Acute duodenal ulcer with hemorrhage: Secondary | ICD-10-CM | POA: Diagnosis present

## 2017-10-03 LAB — CBC WITH DIFFERENTIAL/PLATELET
Basophils Absolute: 0 10*3/uL (ref 0.0–0.1)
Basophils Relative: 0 %
Eosinophils Absolute: 0 10*3/uL (ref 0.0–0.7)
Eosinophils Relative: 0 %
HCT: 22.9 % — ABNORMAL LOW (ref 39.0–52.0)
Hemoglobin: 7.2 g/dL — ABNORMAL LOW (ref 13.0–17.0)
Lymphocytes Relative: 14 %
Lymphs Abs: 1.6 10*3/uL (ref 0.7–4.0)
MCH: 28.2 pg (ref 26.0–34.0)
MCHC: 31.4 g/dL (ref 30.0–36.0)
MCV: 89.8 fL (ref 78.0–100.0)
Monocytes Absolute: 0.3 10*3/uL (ref 0.1–1.0)
Monocytes Relative: 3 %
Neutro Abs: 9.5 10*3/uL — ABNORMAL HIGH (ref 1.7–7.7)
Neutrophils Relative %: 83 %
Platelets: 203 10*3/uL (ref 150–400)
RBC: 2.55 MIL/uL — ABNORMAL LOW (ref 4.22–5.81)
RDW: 13.4 % (ref 11.5–15.5)
WBC: 11.4 10*3/uL — ABNORMAL HIGH (ref 4.0–10.5)

## 2017-10-03 LAB — IRON AND TIBC
Iron: 63 ug/dL (ref 45–182)
Saturation Ratios: 28 % (ref 17.9–39.5)
TIBC: 227 ug/dL — ABNORMAL LOW (ref 250–450)
UIBC: 164 ug/dL

## 2017-10-03 LAB — COMPREHENSIVE METABOLIC PANEL
ALT: 17 U/L (ref 17–63)
AST: 18 U/L (ref 15–41)
Albumin: 2.9 g/dL — ABNORMAL LOW (ref 3.5–5.0)
Alkaline Phosphatase: 43 U/L (ref 38–126)
Anion gap: 10 (ref 5–15)
BUN: 107 mg/dL — ABNORMAL HIGH (ref 6–20)
CO2: 18 mmol/L — ABNORMAL LOW (ref 22–32)
Calcium: 7.9 mg/dL — ABNORMAL LOW (ref 8.9–10.3)
Chloride: 110 mmol/L (ref 101–111)
Creatinine, Ser: 3.62 mg/dL — ABNORMAL HIGH (ref 0.61–1.24)
GFR calc Af Amer: 16 mL/min — ABNORMAL LOW (ref >60)
GFR calc non Af Amer: 14 mL/min — ABNORMAL LOW (ref >60)
Glucose, Bld: 130 mg/dL — ABNORMAL HIGH (ref 65–99)
Potassium: 5.3 mmol/L — ABNORMAL HIGH (ref 3.5–5.1)
Sodium: 138 mmol/L (ref 135–145)
Total Bilirubin: 0.6 mg/dL (ref 0.3–1.2)
Total Protein: 5.2 g/dL — ABNORMAL LOW (ref 6.5–8.1)

## 2017-10-03 LAB — RETICULOCYTES
RBC.: 2.47 MIL/uL — ABNORMAL LOW (ref 4.22–5.81)
Retic Count, Absolute: 54.3 10*3/uL (ref 19.0–186.0)
Retic Ct Pct: 2.2 % (ref 0.4–3.1)

## 2017-10-03 LAB — MRSA PCR SCREENING: MRSA BY PCR: POSITIVE — AB

## 2017-10-03 LAB — VITAMIN B12: Vitamin B-12: 342 pg/mL (ref 180–914)

## 2017-10-03 LAB — PROTIME-INR
INR: 1.2
Prothrombin Time: 15.1 seconds (ref 11.4–15.2)

## 2017-10-03 LAB — POC OCCULT BLOOD, ED: Fecal Occult Bld: POSITIVE — AB

## 2017-10-03 LAB — FERRITIN: Ferritin: 93 ng/mL (ref 24–336)

## 2017-10-03 LAB — I-STAT TROPONIN, ED: Troponin i, poc: 0.03 ng/mL (ref 0.00–0.08)

## 2017-10-03 LAB — FOLATE: Folate: 8.4 ng/mL (ref >5.9)

## 2017-10-03 LAB — PREPARE RBC (CROSSMATCH)

## 2017-10-03 LAB — I-STAT CG4 LACTIC ACID, ED: Lactic Acid, Venous: 1.62 mmol/L (ref 0.5–1.9)

## 2017-10-03 LAB — TSH: TSH: 2.621 u[IU]/mL (ref 0.350–4.500)

## 2017-10-03 MED ORDER — VITAMIN D 1000 UNITS PO TABS
1000.0000 [IU] | ORAL_TABLET | Freq: Two times a day (BID) | ORAL | Status: DC
  Administered 2017-10-03 – 2017-10-10 (×13): 1000 [IU] via ORAL
  Filled 2017-10-03 (×13): qty 1

## 2017-10-03 MED ORDER — HYDROCODONE-ACETAMINOPHEN 5-325 MG PO TABS: 1.0000 | ORAL_TABLET | ORAL | Status: DC | PRN

## 2017-10-03 MED ORDER — ONDANSETRON HCL 4 MG PO TABS: 4.0000 mg | ORAL_TABLET | Freq: Four times a day (QID) | ORAL | Status: DC | PRN

## 2017-10-03 MED ORDER — ONDANSETRON HCL 4 MG/2ML IJ SOLN
4.0000 mg | Freq: Once | INTRAMUSCULAR | Status: AC
  Administered 2017-10-03: 4 mg via INTRAVENOUS
  Filled 2017-10-03: qty 2

## 2017-10-03 MED ORDER — SODIUM CHLORIDE 0.9 % IV SOLN
10.0000 mL/h | Freq: Once | INTRAVENOUS | Status: DC
Start: 2017-10-03 — End: 2017-10-03

## 2017-10-03 MED ORDER — ACETAMINOPHEN 325 MG PO TABS
650.0000 mg | ORAL_TABLET | Freq: Four times a day (QID) | ORAL | Status: DC | PRN
  Administered 2017-10-05: 650 mg via ORAL
  Filled 2017-10-03: qty 2

## 2017-10-03 MED ORDER — BISACODYL 10 MG RE SUPP: 10.0000 mg | Freq: Every day | RECTAL | Status: DC | PRN

## 2017-10-03 MED ORDER — SODIUM CHLORIDE 0.9 % IV BOLUS (SEPSIS)
500.0000 mL | Freq: Once | INTRAVENOUS | Status: AC
  Administered 2017-10-03: 500 mL via INTRAVENOUS

## 2017-10-03 MED ORDER — ONDANSETRON HCL 4 MG/2ML IJ SOLN
4.0000 mg | Freq: Four times a day (QID) | INTRAMUSCULAR | Status: DC | PRN
  Administered 2017-10-03: 4 mg via INTRAVENOUS

## 2017-10-03 MED ORDER — CEFUROXIME AXETIL 250 MG PO TABS
250.0000 mg | ORAL_TABLET | Freq: Two times a day (BID) | ORAL | Status: DC
  Filled 2017-10-03 (×3): qty 1

## 2017-10-03 MED ORDER — ACETAMINOPHEN 650 MG RE SUPP: 650.0000 mg | Freq: Four times a day (QID) | RECTAL | Status: DC | PRN

## 2017-10-03 MED ORDER — SENNOSIDES-DOCUSATE SODIUM 8.6-50 MG PO TABS: 1.0000 | ORAL_TABLET | Freq: Every evening | ORAL | Status: DC | PRN

## 2017-10-03 MED ORDER — LATANOPROST 0.005 % OP SOLN
1.0000 [drp] | Freq: Every day | OPHTHALMIC | Status: DC
  Administered 2017-10-03 – 2017-10-09 (×6): 1 [drp] via OPHTHALMIC
  Filled 2017-10-03 (×2): qty 2.5

## 2017-10-03 MED ORDER — SODIUM CHLORIDE 0.9 % IV SOLN
INTRAVENOUS | Status: DC
  Administered 2017-10-03 – 2017-10-04 (×3): 1000 mL via INTRAVENOUS
  Administered 2017-10-05: 02:00:00 via INTRAVENOUS

## 2017-10-03 MED ORDER — DORZOLAMIDE HCL 2 % OP SOLN
1.0000 [drp] | Freq: Two times a day (BID) | OPHTHALMIC | Status: DC
Start: 2017-10-03 — End: 2017-10-10
  Administered 2017-10-03 – 2017-10-10 (×14): 1 [drp] via OPHTHALMIC
  Filled 2017-10-03: qty 10

## 2017-10-03 MED ORDER — PANTOPRAZOLE SODIUM 40 MG IV SOLR
40.0000 mg | Freq: Two times a day (BID) | INTRAVENOUS | Status: DC
  Administered 2017-10-03 – 2017-10-04 (×2): 40 mg via INTRAVENOUS
  Filled 2017-10-03 (×2): qty 40

## 2017-10-03 MED ORDER — DORZOLAMIDE HCL-TIMOLOL MAL 2-0.5 % OP SOLN: 1.0000 [drp] | Freq: Two times a day (BID) | OPHTHALMIC | Status: DC

## 2017-10-03 MED ORDER — ONDANSETRON HCL 4 MG/2ML IJ SOLN
INTRAMUSCULAR | Status: AC
  Filled 2017-10-03: qty 2

## 2017-10-03 MED ORDER — TIMOLOL MALEATE 0.5 % OP SOLN
1.0000 [drp] | Freq: Two times a day (BID) | OPHTHALMIC | Status: DC
  Administered 2017-10-03 – 2017-10-10 (×14): 1 [drp] via OPHTHALMIC
  Filled 2017-10-03: qty 5

## 2017-10-03 MED ORDER — CEFUROXIME AXETIL 250 MG PO TABS
250.0000 mg | ORAL_TABLET | Freq: Every day | ORAL | Status: DC
  Administered 2017-10-03 – 2017-10-04 (×2): 250 mg via ORAL
  Filled 2017-10-03 (×2): qty 1

## 2017-10-03 NOTE — H&P (View-Only) (Signed)
[facility] Consult  Referring Provider: Rondel Jumbo, PA-C([facility]) Primary Care Physician:  [name], MD Primary Gastroenterologist: Althia Forts  Reason for Consultation:  anemia, vomiting blood  HPI: Bradley [name] is a 84 y.o. male was admitted on 10/03/2017 with complaints of anemia. Patient is a very poor historian, he states he has some epigastric discomfort and is unsure if he had black stools or bloody stools. As per documentation, he was found by EMS to be hypotensive and was noted to have some blood in his vomitus. He was discharged on 09/24/17 with a hemoglobin of 9.6 and presented to the ER with a hemoglobin of 7.2,elevated BUN/creatinine ratio of 107/3.62 and stool tested positive for occult blood. He has multiple comorbidities including coronary artery disease, recent syncope, pacemaker placement, chronic kidney disease, CHF with EF of 30% and BPH.  Past Medical History:  Diagnosis Date  . Arthritis   . BPH (benign prostatic hypertrophy)   . Cardiomyopathy ([place])    a. thought to be ischemic with high risk MPS EF 23%. Reluctant to do cath due to CKD  . Cataract   . Chronic kidney disease   . Dysrhythmia   . GERD (gastroesophageal reflux disease)   . Hypertension   . PPD positive    a. HX POSITIVE PPD WITH NEGATIVE CXR  . Status post placement of cardiac pacemaker    a. 04/2015: bradycardic arrest s/p Biotronik serial #[id number] pacemaker.  . Strabismus    a. right eye  . Symptomatic bradycardia    a. s/p Biotronik serial [id number] pacemaker.  . Syncope and collapse    a. in 2014 and again in 08/2014. Thought to be vasovagal   . Varicosities of leg     Past Surgical History:  Procedure Laterality Date  . CATARACT EXTRACTION W/ INTRAOCULAR LENS IMPLANT     BOTH EYES  . EP IMPLANTABLE DEVICE N/A 05/13/2015   Procedure: Pacemaker Implant;  Surgeon: Evans Lance, MD;  Location: [facility];  Service: Cardiovascular;  Laterality:  N/A;  . TONSILLECTOMY     "I was young" (10/15/2012)  . TOTAL KNEE ARTHROPLASTY Left 12/01/2012   Procedure: LEFT TOTAL KNEE ARTHROPLASTY;  Surgeon: [name], MD;  Location: [facility];  Service: Orthopedics;  Laterality: Left;  . TRANSURETHRAL RESECTION OF PROSTATE  2006    Prior to Admission medications   Medication Sig Start Date End Date Taking? Authorizing Provider  acetaminophen (TYLENOL) 500 MG tablet Take 500 mg by mouth at bedtime as needed for headache (pain).   Yes [provider]  aspirin EC 81 MG tablet Take 1 tablet (81 mg total) by mouth daily. 09/07/14  Yes Belva Crome, MD  carvedilol (COREG) 6.25 MG tablet TAKE 1 TABLET (6.25 MG TOTAL) BY MOUTH 2 (TWO) TIMES DAILY. 05/13/17  Yes Eileen Stanford, PA-C  cholecalciferol (VITAMIN D) 1000 UNITS tablet Take 1,000 Units by mouth 2 (two) times daily.   Yes [provider]  docusate sodium (COLACE) 100 MG capsule Take 100 mg by mouth 2 (two) times daily as needed for mild constipation.   Yes [provider]  dorzolamide-timolol (COSOPT) 22.3-6.8 MG/ML ophthalmic solution Place 1 drop into both eyes 2 (two) times daily.  08/21/14  Yes [provider]  furosemide (LASIX) 40 MG tablet Take 1 tablet (40 mg total) by mouth daily. 09/02/17  Yes Belva Crome, MD  hydrALAZINE (APRESOLINE) 25 MG tablet TAKE 1 TABLET (25 MG TOTAL) BY MOUTH 2 (TWO) TIMES  DAILY. 02/07/17  Yes Evans Lance, MD  isosorbide mononitrate (IMDUR) 60 MG 24 hr tablet Take 60 mg by mouth daily.   Yes [provider]  latanoprost (XALATAN) 0.005 % ophthalmic solution Place 1 drop into both eyes at bedtime.   Yes [provider]  Multiple Vitamin (MULTIVITAMIN WITH MINERALS) TABS tablet Take 1 tablet by mouth daily.   Yes [provider]  nitroGLYCERIN (NITROSTAT) 0.4 MG SL tablet Place 0.4 mg under the tongue every 5 (five) minutes as needed for chest pain (max 3 doses within 15 minutes call 911).   Yes  [provider]    Current Facility-Administered Medications  Medication Dose Route Frequency Provider Last Rate Last Dose  . 0.9 %  sodium chloride infusion   Intravenous Continuous Rondel Jumbo, PA-C      . acetaminophen (TYLENOL) tablet 650 mg  650 mg Oral Q6H PRN Rondel Jumbo, PA-C       Or  . acetaminophen (TYLENOL) suppository 650 mg  650 mg Rectal Q6H PRN Rondel Jumbo, PA-C      . bisacodyl (DULCOLAX) suppository 10 mg  10 mg Rectal Daily PRN Rondel Jumbo, PA-C      . cefUROXime (CEFTIN) tablet 250 mg  250 mg Oral BID WC Wertman, Sara E, PA-C      . cholecalciferol (VITAMIN D) tablet 1,000 Units  1,000 Units Oral BID Shawn Route, Sara E, PA-C      . dorzolamide-timolol (COSOPT) 22.3-6.8 MG/ML ophthalmic solution 1 drop  1 drop Both Eyes BID Rondel Jumbo, PA-C      . HYDROcodone-acetaminophen (NORCO/VICODIN) 5-325 MG per tablet 1-2 tablet  1-2 tablet Oral Q4H PRN Sharene Butters E, PA-C      . latanoprost (XALATAN) 0.005 % ophthalmic solution 1 drop  1 drop Both Eyes QHS Wertman, Sara E, PA-C      . ondansetron (ZOFRAN) 4 MG/2ML injection           . ondansetron (ZOFRAN) tablet 4 mg  4 mg Oral Q6H PRN Rondel Jumbo, PA-C       Or  . ondansetron [facility]) injection 4 mg  4 mg Intravenous Q6H PRN Sharene Butters E, PA-C   4 mg at 10/03/17 1516  . pantoprazole (PROTONIX) injection 40 mg  40 mg Intravenous Q12H Wertman, Sara E, PA-C      . senna-docusate (Senokot-S) tablet 1 tablet  1 tablet Oral QHS PRN Rondel Jumbo, PA-C       Current Outpatient Medications  Medication Sig Dispense Refill  . acetaminophen (TYLENOL) 500 MG tablet Take 500 mg by mouth at bedtime as needed for headache (pain).    Marland Kitchen aspirin EC 81 MG tablet Take 1 tablet (81 mg total) by mouth daily.    . carvedilol (COREG) 6.25 MG tablet TAKE 1 TABLET (6.25 MG TOTAL) BY MOUTH 2 (TWO) TIMES DAILY. 60 tablet 10  . cholecalciferol (VITAMIN D) 1000 UNITS tablet Take 1,000 Units by mouth 2 (two) times daily.     Marland Kitchen docusate sodium (COLACE) 100 MG capsule Take 100 mg by mouth 2 (two) times daily as needed for mild constipation.    . dorzolamide-timolol (COSOPT) 22.3-6.8 MG/ML ophthalmic solution Place 1 drop into both eyes 2 (two) times daily.     . furosemide (LASIX) 40 MG tablet Take 1 tablet (40 mg total) by mouth daily. 90 tablet 2  . hydrALAZINE (APRESOLINE) 25 MG tablet TAKE 1 TABLET (25 MG TOTAL) BY MOUTH 2 (TWO)  TIMES DAILY. 60 tablet 11  . isosorbide mononitrate (IMDUR) 60 MG 24 hr tablet Take 60 mg by mouth daily.    Marland Kitchen latanoprost (XALATAN) 0.005 % ophthalmic solution Place 1 drop into both eyes at bedtime.    . Multiple Vitamin (MULTIVITAMIN WITH MINERALS) TABS tablet Take 1 tablet by mouth daily.    . nitroGLYCERIN (NITROSTAT) 0.4 MG SL tablet Place 0.4 mg under the tongue every 5 (five) minutes as needed for chest pain (max 3 doses within 15 minutes call 911).      Allergies as of 10/03/2017  . (No Known Allergies)    Family History  Problem Relation Age of Onset  . Heart failure Mother   . Hypertension Mother   . Pneumonia Father   . Hypertension Brother   . Heart attack Neg Hx   . Stroke Neg Hx     Social History   Socioeconomic History  . Marital status: Married    Spouse name: Not on file  . Number of children: Not on file  . Years of education: Not on file  . Highest education level: Not on file  Social Needs  . Financial resource strain: Not on file  . Food insecurity - worry: Not on file  . Food insecurity - inability: Not on file  . Transportation needs - medical: Not on file  . Transportation needs - non-medical: Not on file  Occupational History  . Occupation: Retired  Tobacco Use  . Smoking status: Never Smoker  . Smokeless tobacco: Never Used  Substance and Sexual Activity  . Alcohol use: No  . Drug use: No  . Sexual activity: No  Other Topics Concern  . Not on file  Social History Narrative   Lives with his wife.    Review of Systems: Positive  for: GI: Described in detail in HPI.    Gen:  fatigue, weakness, malaise,Denies any fever, chills, rigors, night sweats, anorexia, involuntary weight loss, and sleep disorder CV: Denies chest pain, angina, palpitations, syncope, orthopnea, PND, peripheral edema, and claudication. Resp: Denies dyspnea, cough, sputum, wheezing, coughing up blood. GU : Denies urinary burning, blood in urine, urinary frequency, urinary hesitancy, nocturnal urination, and urinary incontinence. MS: Denies joint pain or swelling.  Denies muscle weakness, cramps, atrophy.  Derm: Denies rash, itching, oral ulcerations, hives, unhealing ulcers.  Psych: Denies depression, anxiety, memory loss, suicidal ideation, hallucinations,  and confusion. Heme: Denies bruising, bleeding, and enlarged lymph nodes. Neuro:  Denies any headaches, dizziness, paresthesias. Endo:  Denies any problems with DM, thyroid, adrenal function.  Physical Exam: Vital signs in last 24 hours: Temp:  [97.5 F (36.4 C)-98.3 F (36.8 C)] 97.5 F (36.4 C) (03/07 1526) Pulse Rate:  [70-81] 71 (03/07 1545) Resp:  [10-20] 15 (03/07 1545) BP: (90-134)/(58-78) 109/78 (03/07 1532) SpO2:  [96 %-100 %] 99 % (03/07 1545) Weight:  [81.6 kg (180 lb)] 81.6 kg (180 lb) (03/07 1213)    General:   Appears his stated age,cooperative in NAD Head:  Normocephalic and atraumatic. Eyes:  Sclera clear, no icterus.   Mild pallor Ears:  Normal auditory acuity. Nose:  No deformity, discharge,  or lesions. Mouth:  No deformity or lesions.  Oropharynx pink & moist. Neck:  Supple; no masses or thyromegaly. Lungs:  Clear throughout to auscultation.   No wheezes, crackles, or rhonchi. No acute distress. Heart:  Regular rate and rhythm; no murmurs, clicks, rubs,  or gallops. Extremities:  Without clubbing or edema. Neurologic:  Alert and  oriented x4;  grossly normal neurologically. Skin:  Intact without significant lesions or rashes. Psych:  Alert and cooperative.  Normal mood and affect. Abdomen:  Soft, nontender and nondistended. No masses, hepatosplenomegaly or hernias noted. Normal bowel sounds, without guarding, and without rebound.         Lab Results: Recent Labs    10/03/17 1243  WBC 11.4*  HGB 7.2*  HCT 22.9*  PLT 203   BMET Recent Labs    10/03/17 1243  NA 138  K 5.3*  CL 110  CO2 18*  GLUCOSE 130*  BUN 107*  CREATININE 3.62*  CALCIUM 7.9*   LFT Recent Labs    10/03/17 1243  PROT 5.2*  ALBUMIN 2.9*  AST 18  ALT 17  ALKPHOS 43  BILITOT 0.6   PT/INR Recent Labs    10/03/17 1243  LABPROT 15.1  INR 1.20    Studies/Results: Ct Head Wo Contrast  Result Date: 10/03/2017 CLINICAL DATA:  Fall today.  Recent weakness.  Hypotensive. EXAM: CT HEAD WITHOUT CONTRAST TECHNIQUE: Contiguous axial images were obtained from the base of the skull through the vertex without intravenous contrast. COMPARISON:  09/22/2017 FINDINGS: Brain: There is no evidence of acute infarct, intracranial hemorrhage, mass, midline shift, or extra-axial fluid collection. There is mild cerebral atrophy. Patchy cerebral white matter hypodensities are unchanged and nonspecific but compatible with mild-to-moderate chronic small vessel ischemic disease. Vascular: Extensive calcified atherosclerosis at the skull base. No hyperdense vessel. Skull: No fracture or focal osseous lesion. Sinuses/Orbits: Visualized paranasal sinuses and mastoid air cells are clear. Visualized orbits are unremarkable. Other: None. IMPRESSION: 1. No evidence of acute intracranial abnormality. 2. Mild-to-moderate chronic small vessel ischemic disease. Electronically Signed   By: Logan Bores M.D.   On: 10/03/2017 13:43    Impression: 1.Anemia, found to have some blood on vomitus, FOBT positive stool,possible upper GI bleed, hemoglobin 2 units less that last admission. 2.Hypotensive on admission.  Plan: 2 units PRBC transfusion ordered. Currently hemodynamically stable. We will  keep him nothing by mouth and plan diagnostic endoscopy in a.m.. On Protonix 40 mg twice a day.   LOS: 0 days   Ronnette Juniper, M.D.  10/03/2017, 4:08 PM  Pager [phone] If no answer or after 5 PM call [phone]

## 2017-10-03 NOTE — ED Notes (Signed)
Got patient on the monitor did ekg shown to er doctor patient is resting with call bell in reach 

## 2017-10-03 NOTE — ED Provider Notes (Signed)
[name] Provider Note   CSN: [id number] Arrival date & time: 10/03/17  1156     History   Chief Complaint Chief Complaint  Patient presents with  . Fall    HPI Bradley [name] is a 84 y.o. male with history of cardiomyopathy with pacemaker, chronic kidney disease, hypertension who presents following episode of hypotension and fall.  Patient also reports a 1 day history of bloody stools.  He has had associated with over 1 week history of abdominal pain, for which she had a CT scan on 09/22/2017 which showed diverticulosis.  Patient reports rolling over in bed and passing out.  He is unsure if he passed out before after he fell.  He is confused around the time of the incident.  EMS reports patient had  an episode of hematemesis in route.  He denies any chest pain, shortness of breath.  He does not drink alcohol.  HPI  Past Medical History:  Diagnosis Date  . Arthritis   . BPH (benign prostatic hypertrophy)   . Cardiomyopathy ([place])    a. thought to be ischemic with high risk MPS EF 23%. Reluctant to do cath due to CKD  . Cataract   . Chronic kidney disease   . Dysrhythmia   . GERD (gastroesophageal reflux disease)   . Hypertension   . PPD positive    a. HX POSITIVE PPD WITH NEGATIVE CXR  . Status post placement of cardiac pacemaker    a. 04/2015: bradycardic arrest s/p Biotronik serial #[id number] pacemaker.  . Strabismus    a. right eye  . Symptomatic bradycardia    a. s/p Biotronik serial [id number] pacemaker.  . Syncope and collapse    a. in 2014 and again in 08/2014. Thought to be vasovagal   . Varicosities of leg     Patient Active Problem List   Diagnosis Date Noted  . GIB (gastrointestinal bleeding) 10/03/2017  . Abdominal pain 09/22/2017  . Symptomatic anemia 09/22/2017  . Protein-calorie malnutrition, moderate ([place]) 09/22/2017  . Symptomatic bradycardia   . Benign prostatic hyperplasia   . Syncope and collapse   . Hypertension   .  Status post placement of cardiac pacemaker   . CAD in native artery 09/21/2014  . Chronic combined systolic (congestive) and diastolic (congestive) heart failure ([place]) 09/07/2014  . Chronic kidney disease, stage IV (severe) ([place]) 08/25/2014  . Acute lower UTI 09/13/2011  . Urethral stricture 09/13/2011  . GERD (gastroesophageal reflux disease) 09/13/2011  . Osteoarthritis of both knees 09/13/2011    Past Surgical History:  Procedure Laterality Date  . CATARACT EXTRACTION W/ INTRAOCULAR LENS IMPLANT     BOTH EYES  . EP IMPLANTABLE DEVICE N/A 05/13/2015   Procedure: Pacemaker Implant;  Surgeon: Evans Lance, MD;  Location: [facility];  Service: Cardiovascular;  Laterality: N/A;  . TONSILLECTOMY     "I was young" (10/15/2012)  . TOTAL KNEE ARTHROPLASTY Left 12/01/2012   Procedure: LEFT TOTAL KNEE ARTHROPLASTY;  Surgeon: [name], MD;  Location: [facility];  Service: Orthopedics;  Laterality: Left;  . TRANSURETHRAL RESECTION OF PROSTATE  2006       Home Medications    Prior to Admission medications   Medication Sig Start Date End Date Taking? Authorizing Provider  acetaminophen (TYLENOL) 500 MG tablet Take 500 mg by mouth at bedtime as needed for headache (pain).   Yes [provider]  aspirin EC 81 MG tablet Take 1 tablet (81 mg total) by  mouth daily. 09/07/14  Yes Belva Crome, MD  carvedilol (COREG) 6.25 MG tablet TAKE 1 TABLET (6.25 MG TOTAL) BY MOUTH 2 (TWO) TIMES DAILY. 05/13/17  Yes Eileen Stanford, PA-C  cholecalciferol (VITAMIN D) 1000 UNITS tablet Take 1,000 Units by mouth 2 (two) times daily.   Yes [provider]  docusate sodium (COLACE) 100 MG capsule Take 100 mg by mouth 2 (two) times daily as needed for mild constipation.   Yes [provider]  dorzolamide-timolol (COSOPT) 22.3-6.8 MG/ML ophthalmic solution Place 1 drop into both eyes 2 (two) times daily.  08/21/14  Yes [provider]  furosemide (LASIX) 40 MG tablet  Take 1 tablet (40 mg total) by mouth daily. 09/02/17  Yes Belva Crome, MD  hydrALAZINE (APRESOLINE) 25 MG tablet TAKE 1 TABLET (25 MG TOTAL) BY MOUTH 2 (TWO) TIMES DAILY. 02/07/17  Yes Evans Lance, MD  isosorbide mononitrate (IMDUR) 60 MG 24 hr tablet Take 60 mg by mouth daily.   Yes [provider]  latanoprost (XALATAN) 0.005 % ophthalmic solution Place 1 drop into both eyes at bedtime.   Yes [provider]  Multiple Vitamin (MULTIVITAMIN WITH MINERALS) TABS tablet Take 1 tablet by mouth daily.   Yes [provider]  nitroGLYCERIN (NITROSTAT) 0.4 MG SL tablet Place 0.4 mg under the tongue every 5 (five) minutes as needed for chest pain (max 3 doses within 15 minutes call 911).   Yes [provider]    Family History Family History  Problem Relation Age of Onset  . Heart failure Mother   . Hypertension Mother   . Pneumonia Father   . Hypertension Brother   . Heart attack Neg Hx   . Stroke Neg Hx     Social History Social History   Tobacco Use  . Smoking status: Never Smoker  . Smokeless tobacco: Never Used  Substance Use Topics  . Alcohol use: No  . Drug use: No     Allergies   Patient has no known allergies.   Review of Systems Review of Systems  Constitutional: Negative for chills and fever.  HENT: Negative for facial swelling and sore throat.   Respiratory: Negative for shortness of breath.   Cardiovascular: Negative for chest pain.  Gastrointestinal: Positive for abdominal pain, blood in stool (melena) and vomiting. Negative for nausea.  Genitourinary: Negative for dysuria.  Musculoskeletal: Negative for back pain.  Skin: Negative for rash and wound.  Neurological: Positive for syncope. Negative for headaches.  Psychiatric/Behavioral: The patient is not nervous/anxious.      Physical Exam Updated Vital Signs BP 115/86   Pulse 79   Temp (!) 97.5 F (36.4 C) (Oral)   Resp 17   Ht 6\' 1"  (1.854 m)   Wt 81.6 kg (180  lb)   SpO2 100%   BMI 23.75 kg/m   Physical Exam  Constitutional: He appears well-developed and well-nourished. No distress.  HENT:  Head: Normocephalic and atraumatic.  Mouth/Throat: Oropharynx is clear and moist. No oropharyngeal exudate.  Eyes: Conjunctivae are normal. Pupils are equal, round, and reactive to light. Right eye exhibits no discharge. Left eye exhibits no discharge. No scleral icterus.  Neck: Normal range of motion. Neck supple. No thyromegaly present.  Cardiovascular: Normal rate, regular rhythm, normal heart sounds and intact distal pulses. Exam reveals no gallop and no friction rub.  No murmur heard. Pulmonary/Chest: Effort normal and breath sounds normal. No stridor. No respiratory distress. He has no wheezes. He has no  rales.  Abdominal: Soft. Bowel sounds are normal. He exhibits no distension. There is no tenderness. There is no rebound and no guarding.  Genitourinary: Rectal exam shows guaiac positive stool.  Musculoskeletal: He exhibits no edema.  Lymphadenopathy:    He has no cervical adenopathy.  Neurological: He is alert. Coordination normal.  Skin: Skin is warm and dry. No rash noted. He is not diaphoretic. No pallor.  Very minor abrasion lateral to L eyebrow with mild underlying edema  Psychiatric: He has a normal mood and affect.  Nursing note and vitals reviewed.    ED Treatments / Results  Labs (all labs ordered are listed, but only abnormal results are displayed) Labs Reviewed  COMPREHENSIVE METABOLIC PANEL - Abnormal; Notable for the following components:      Result Value   Potassium 5.3 (*)    CO2 18 (*)    Glucose, Bld 130 (*)    BUN 107 (*)    Creatinine, Ser 3.62 (*)    Calcium 7.9 (*)    Total Protein 5.2 (*)    Albumin 2.9 (*)    GFR calc non Af Amer 14 (*)    GFR calc Af Amer 16 (*)    All other components within normal limits  CBC WITH DIFFERENTIAL/PLATELET - Abnormal; Notable for the following components:   WBC 11.4 (*)     RBC 2.55 (*)    Hemoglobin 7.2 (*)    HCT 22.9 (*)    Neutro Abs 9.5 (*)    All other components within normal limits  IRON AND TIBC - Abnormal; Notable for the following components:   TIBC 227 (*)    All other components within normal limits  RETICULOCYTES - Abnormal; Notable for the following components:   RBC. 2.47 (*)    All other components within normal limits  POC OCCULT BLOOD, ED - Abnormal; Notable for the following components:   Fecal Occult Bld POSITIVE (*)    All other components within normal limits  PROTIME-INR  VITAMIN B12  FOLATE  FERRITIN  TSH  URINALYSIS, ROUTINE W REFLEX MICROSCOPIC  TROPONIN I  TROPONIN I  TROPONIN I  CBC  CBC  I-STAT CG4 LACTIC ACID, ED  I-STAT TROPONIN, ED  TYPE AND SCREEN  PREPARE RBC (CROSSMATCH)    EKG  EKG Interpretation  Date/Time:  Thursday October 03 2017 12:03:44 EST Ventricular Rate:  77 PR Interval:    QRS Duration: 126 QT Interval:  376 QTC Calculation: 426 R Axis:   51 Text Interpretation:  Sinus rhythm Nonspecific intraventricular conduction delay Nonspecific T wave abnormality Confirmed by Lajean Saver [phone]) on 10/03/2017 1:44:49 PM       Radiology Ct Head Wo Contrast  Result Date: 10/03/2017 CLINICAL DATA:  Fall today.  Recent weakness.  Hypotensive. EXAM: CT HEAD WITHOUT CONTRAST TECHNIQUE: Contiguous axial images were obtained from the base of the skull through the vertex without intravenous contrast. COMPARISON:  09/22/2017 FINDINGS: Brain: There is no evidence of acute infarct, intracranial hemorrhage, mass, midline shift, or extra-axial fluid collection. There is mild cerebral atrophy. Patchy cerebral white matter hypodensities are unchanged and nonspecific but compatible with mild-to-moderate chronic small vessel ischemic disease. Vascular: Extensive calcified atherosclerosis at the skull base. No hyperdense vessel. Skull: No fracture or focal osseous lesion. Sinuses/Orbits: Visualized paranasal sinuses and  mastoid air cells are clear. Visualized orbits are unremarkable. Other: None. IMPRESSION: 1. No evidence of acute intracranial abnormality. 2. Mild-to-moderate chronic small vessel ischemic disease. Electronically Signed   By: Zenia Resides  Jeralyn Ruths M.D.   On: 10/03/2017 13:43    Procedures Procedures (including critical care time)  Medications Ordered in ED Medications  cefUROXime (CEFTIN) tablet 250 mg (not administered)  cholecalciferol (VITAMIN D) tablet 1,000 Units (not administered)  latanoprost (XALATAN) 0.005 % ophthalmic solution 1 drop (not administered)  pantoprazole (PROTONIX) injection 40 mg (not administered)  0.9 %  sodium chloride infusion (not administered)  acetaminophen (TYLENOL) tablet 650 mg (not administered)    Or  acetaminophen (TYLENOL) suppository 650 mg (not administered)  HYDROcodone-acetaminophen (NORCO/VICODIN) 5-325 MG per tablet 1-2 tablet (not administered)  senna-docusate (Senokot-S) tablet 1 tablet (not administered)  bisacodyl (DULCOLAX) suppository 10 mg (not administered)  ondansetron (ZOFRAN) tablet 4 mg ( Oral See Alternative 10/03/17 1516)    Or  ondansetron (ZOFRAN) injection 4 mg (4 mg Intravenous Given 10/03/17 1516)  ondansetron (ZOFRAN) 4 MG/2ML injection (not administered)  dorzolamide (TRUSOPT) 2 % ophthalmic solution 1 drop (not administered)    And  timolol (TIMOPTIC) 0.5 % ophthalmic solution 1 drop (not administered)  ondansetron (ZOFRAN) injection 4 mg (4 mg Intravenous Given 10/03/17 1252)  sodium chloride 0.9 % bolus 500 mL (0 mLs Intravenous Stopped 10/03/17 1421)     Initial Impression / Assessment and Plan / ED Course  I have reviewed the triage vital signs and the nursing notes.  Pertinent labs & imaging results that were available during my care of the patient were reviewed by me and considered in my medical decision making (see chart for details).     Patient with progressive decrease in hemoglobin since admission last week.  Hemoglobin  today, 7.2.  Patient with melena and heme positive stool.  Blood pressure stabilized with fluid bolus.  Renal function stable, BUN 107, creatinine 3.62.  Potassium 5.3, albumin 2.9, protein 5.2.  Head CT is negative for any intra-abnormality or injury cranial; mild to moderate chronic small vessel ischemic disease.  I spoke with Dr. Bernerd Pho with gastroenterology who will evaluate the patient.  I spoke with Sharene Butters, PA-C with [facility] who will admit the patient for further evaluation and treatment.  Patient also evaluated by Dr. Delice Lesch L who guided the patient's management and agrees with plan.  Final Clinical Impressions(s) / ED Diagnoses   Final diagnoses:  Gastrointestinal hemorrhage with melena  Symptomatic anemia    ED Discharge Orders    None       Frederica Kuster, PA-C 10/03/17 1634    Lajean Saver, MD 10/04/17 [phone]

## 2017-10-03 NOTE — Consult Note (Signed)
[facility] Consult  Referring Provider: Rondel Jumbo, PA-C([facility]) Primary Care Physician:  [name], MD Primary Gastroenterologist: Althia Forts  Reason for Consultation:  anemia, vomiting blood  HPI: Bradley [name] is a 84 y.o. male was admitted on 10/03/2017 with complaints of anemia. Patient is a very poor historian, he states he has some epigastric discomfort and is unsure if he had black stools or bloody stools. As per documentation, he was found by EMS to be hypotensive and was noted to have some blood in his vomitus. He was discharged on 09/24/17 with a hemoglobin of 9.6 and presented to the ER with a hemoglobin of 7.2,elevated BUN/creatinine ratio of 107/3.62 and stool tested positive for occult blood. He has multiple comorbidities including coronary artery disease, recent syncope, pacemaker placement, chronic kidney disease, CHF with EF of 30% and BPH.  Past Medical History:  Diagnosis Date  . Arthritis   . BPH (benign prostatic hypertrophy)   . Cardiomyopathy ([place])    a. thought to be ischemic with high risk MPS EF 23%. Reluctant to do cath due to CKD  . Cataract   . Chronic kidney disease   . Dysrhythmia   . GERD (gastroesophageal reflux disease)   . Hypertension   . PPD positive    a. HX POSITIVE PPD WITH NEGATIVE CXR  . Status post placement of cardiac pacemaker    a. 04/2015: bradycardic arrest s/p Biotronik serial #[id number] pacemaker.  . Strabismus    a. right eye  . Symptomatic bradycardia    a. s/p Biotronik serial [id number] pacemaker.  . Syncope and collapse    a. in 2014 and again in 08/2014. Thought to be vasovagal   . Varicosities of leg     Past Surgical History:  Procedure Laterality Date  . CATARACT EXTRACTION W/ INTRAOCULAR LENS IMPLANT     BOTH EYES  . EP IMPLANTABLE DEVICE N/A 05/13/2015   Procedure: Pacemaker Implant;  Surgeon: Evans Lance, MD;  Location: [facility];  Service: Cardiovascular;  Laterality:  N/A;  . TONSILLECTOMY     "I was young" (10/15/2012)  . TOTAL KNEE ARTHROPLASTY Left 12/01/2012   Procedure: LEFT TOTAL KNEE ARTHROPLASTY;  Surgeon: [name], MD;  [facility];  Service: Orthopedics;  Laterality: Left;  . TRANSURETHRAL RESECTION OF PROSTATE  2006    Prior to Admission medications   Medication Sig Start Date End Date Taking? Authorizing Provider  acetaminophen (TYLENOL) 500 MG tablet Take 500 mg by mouth at bedtime as needed for headache (pain).   Yes [provider]  aspirin EC 81 MG tablet Take 1 tablet (81 mg total) by mouth daily. 09/07/14  Yes Belva Crome, MD  carvedilol (COREG) 6.25 MG tablet TAKE 1 TABLET (6.25 MG TOTAL) BY MOUTH 2 (TWO) TIMES DAILY. 05/13/17  Yes Eileen Stanford, PA-C  cholecalciferol (VITAMIN D) 1000 UNITS tablet Take 1,000 Units by mouth 2 (two) times daily.   Yes [provider]  docusate sodium (COLACE) 100 MG capsule Take 100 mg by mouth 2 (two) times daily as needed for mild constipation.   Yes [provider]  dorzolamide-timolol (COSOPT) 22.3-6.8 MG/ML ophthalmic solution Place 1 drop into both eyes 2 (two) times daily.  08/21/14  Yes [provider]  furosemide (LASIX) 40 MG tablet Take 1 tablet (40 mg total) by mouth daily. 09/02/17  Yes Belva Crome, MD  hydrALAZINE (APRESOLINE) 25 MG tablet TAKE 1 TABLET (25 MG TOTAL) BY MOUTH 2 (TWO) TIMES  DAILY. 02/07/17  Yes Evans Lance, MD  isosorbide mononitrate (IMDUR) 60 MG 24 hr tablet Take 60 mg by mouth daily.   Yes [provider]  latanoprost (XALATAN) 0.005 % ophthalmic solution Place 1 drop into both eyes at bedtime.   Yes [provider]  Multiple Vitamin (MULTIVITAMIN WITH MINERALS) TABS tablet Take 1 tablet by mouth daily.   Yes [provider]  nitroGLYCERIN (NITROSTAT) 0.4 MG SL tablet Place 0.4 mg under the tongue every 5 (five) minutes as needed for chest pain (max 3 doses within 15 minutes call 911).   Yes  [provider]    Current Facility-Administered Medications  Medication Dose Route Frequency Provider Last Rate Last Dose  . 0.9 %  sodium chloride infusion   Intravenous Continuous Rondel Jumbo, PA-C      . acetaminophen (TYLENOL) tablet 650 mg  650 mg Oral Q6H PRN Rondel Jumbo, PA-C       Or  . acetaminophen (TYLENOL) suppository 650 mg  650 mg Rectal Q6H PRN Rondel Jumbo, PA-C      . bisacodyl (DULCOLAX) suppository 10 mg  10 mg Rectal Daily PRN Rondel Jumbo, PA-C      . cefUROXime (CEFTIN) tablet 250 mg  250 mg Oral BID WC Wertman, Sara E, PA-C      . cholecalciferol (VITAMIN D) tablet 1,000 Units  1,000 Units Oral BID Shawn Route, Sara E, PA-C      . dorzolamide-timolol (COSOPT) 22.3-6.8 MG/ML ophthalmic solution 1 drop  1 drop Both Eyes BID Rondel Jumbo, PA-C      . HYDROcodone-acetaminophen (NORCO/VICODIN) 5-325 MG per tablet 1-2 tablet  1-2 tablet Oral Q4H PRN Sharene Butters E, PA-C      . latanoprost (XALATAN) 0.005 % ophthalmic solution 1 drop  1 drop Both Eyes QHS Wertman, Sara E, PA-C      . ondansetron (ZOFRAN) 4 MG/2ML injection           . ondansetron (ZOFRAN) tablet 4 mg  4 mg Oral Q6H PRN Rondel Jumbo, PA-C       Or  . ondansetron [facility]) injection 4 mg  4 mg Intravenous Q6H PRN Sharene Butters E, PA-C   4 mg at 10/03/17 1516  . pantoprazole (PROTONIX) injection 40 mg  40 mg Intravenous Q12H Wertman, Sara E, PA-C      . senna-docusate (Senokot-S) tablet 1 tablet  1 tablet Oral QHS PRN Rondel Jumbo, PA-C       Current Outpatient Medications  Medication Sig Dispense Refill  . acetaminophen (TYLENOL) 500 MG tablet Take 500 mg by mouth at bedtime as needed for headache (pain).    Marland Kitchen aspirin EC 81 MG tablet Take 1 tablet (81 mg total) by mouth daily.    . carvedilol (COREG) 6.25 MG tablet TAKE 1 TABLET (6.25 MG TOTAL) BY MOUTH 2 (TWO) TIMES DAILY. 60 tablet 10  . cholecalciferol (VITAMIN D) 1000 UNITS tablet Take 1,000 Units by mouth 2 (two) times daily.     Marland Kitchen docusate sodium (COLACE) 100 MG capsule Take 100 mg by mouth 2 (two) times daily as needed for mild constipation.    . dorzolamide-timolol (COSOPT) 22.3-6.8 MG/ML ophthalmic solution Place 1 drop into both eyes 2 (two) times daily.     . furosemide (LASIX) 40 MG tablet Take 1 tablet (40 mg total) by mouth daily. 90 tablet 2  . hydrALAZINE (APRESOLINE) 25 MG tablet TAKE 1 TABLET (25 MG TOTAL) BY MOUTH 2 (TWO)  TIMES DAILY. 60 tablet 11  . isosorbide mononitrate (IMDUR) 60 MG 24 hr tablet Take 60 mg by mouth daily.    Marland Kitchen latanoprost (XALATAN) 0.005 % ophthalmic solution Place 1 drop into both eyes at bedtime.    . Multiple Vitamin (MULTIVITAMIN WITH MINERALS) TABS tablet Take 1 tablet by mouth daily.    . nitroGLYCERIN (NITROSTAT) 0.4 MG SL tablet Place 0.4 mg under the tongue every 5 (five) minutes as needed for chest pain (max 3 doses within 15 minutes call 911).      Allergies as of 10/03/2017  . (No Known Allergies)    Family History  Problem Relation Age of Onset  . Heart failure Mother   . Hypertension Mother   . Pneumonia Father   . Hypertension Brother   . Heart attack Neg Hx   . Stroke Neg Hx     Social History   Socioeconomic History  . Marital status: Married    Spouse name: Not on file  . Number of children: Not on file  . Years of education: Not on file  . Highest education level: Not on file  Social Needs  . Financial resource strain: Not on file  . Food insecurity - worry: Not on file  . Food insecurity - inability: Not on file  . Transportation needs - medical: Not on file  . Transportation needs - non-medical: Not on file  Occupational History  . Occupation: Retired  Tobacco Use  . Smoking status: Never Smoker  . Smokeless tobacco: Never Used  Substance and Sexual Activity  . Alcohol use: No  . Drug use: No  . Sexual activity: No  Other Topics Concern  . Not on file  Social History Narrative   Lives with his wife.    Review of Systems: Positive  for: GI: Described in detail in HPI.    Gen:  fatigue, weakness, malaise,Denies any fever, chills, rigors, night sweats, anorexia, involuntary weight loss, and sleep disorder CV: Denies chest pain, angina, palpitations, syncope, orthopnea, PND, peripheral edema, and claudication. Resp: Denies dyspnea, cough, sputum, wheezing, coughing up blood. GU : Denies urinary burning, blood in urine, urinary frequency, urinary hesitancy, nocturnal urination, and urinary incontinence. MS: Denies joint pain or swelling.  Denies muscle weakness, cramps, atrophy.  Derm: Denies rash, itching, oral ulcerations, hives, unhealing ulcers.  Psych: Denies depression, anxiety, memory loss, suicidal ideation, hallucinations,  and confusion. Heme: Denies bruising, bleeding, and enlarged lymph nodes. Neuro:  Denies any headaches, dizziness, paresthesias. Endo:  Denies any problems with DM, thyroid, adrenal function.  Physical Exam: Vital signs in last 24 hours: Temp:  [97.5 F (36.4 C)-98.3 F (36.8 C)] 97.5 F (36.4 C) (03/07 1526) Pulse Rate:  [70-81] 71 (03/07 1545) Resp:  [10-20] 15 (03/07 1545) BP: (90-134)/(58-78) 109/78 (03/07 1532) SpO2:  [96 %-100 %] 99 % (03/07 1545) Weight:  [81.6 kg (180 lb)] 81.6 kg (180 lb) (03/07 1213)    General:   Appears his stated age,cooperative in NAD Head:  Normocephalic and atraumatic. Eyes:  Sclera clear, no icterus.   Mild pallor Ears:  Normal auditory acuity. Nose:  No deformity, discharge,  or lesions. Mouth:  No deformity or lesions.  Oropharynx pink & moist. Neck:  Supple; no masses or thyromegaly. Lungs:  Clear throughout to auscultation.   No wheezes, crackles, or rhonchi. No acute distress. Heart:  Regular rate and rhythm; no murmurs, clicks, rubs,  or gallops. Extremities:  Without clubbing or edema. Neurologic:  Alert and  oriented x4;  grossly normal neurologically. Skin:  Intact without significant lesions or rashes. Psych:  Alert and cooperative.  Normal mood and affect. Abdomen:  Soft, nontender and nondistended. No masses, hepatosplenomegaly or hernias noted. Normal bowel sounds, without guarding, and without rebound.         Lab Results: Recent Labs    10/03/17 1243  WBC 11.4*  HGB 7.2*  HCT 22.9*  PLT 203   BMET Recent Labs    10/03/17 1243  NA 138  K 5.3*  CL 110  CO2 18*  GLUCOSE 130*  BUN 107*  CREATININE 3.62*  CALCIUM 7.9*   LFT Recent Labs    10/03/17 1243  PROT 5.2*  ALBUMIN 2.9*  AST 18  ALT 17  ALKPHOS 43  BILITOT 0.6   PT/INR Recent Labs    10/03/17 1243  LABPROT 15.1  INR 1.20    Studies/Results: Ct Head Wo Contrast  Result Date: 10/03/2017 CLINICAL DATA:  Fall today.  Recent weakness.  Hypotensive. EXAM: CT HEAD WITHOUT CONTRAST TECHNIQUE: Contiguous axial images were obtained from the base of the skull through the vertex without intravenous contrast. COMPARISON:  09/22/2017 FINDINGS: Brain: There is no evidence of acute infarct, intracranial hemorrhage, mass, midline shift, or extra-axial fluid collection. There is mild cerebral atrophy. Patchy cerebral white matter hypodensities are unchanged and nonspecific but compatible with mild-to-moderate chronic small vessel ischemic disease. Vascular: Extensive calcified atherosclerosis at the skull base. No hyperdense vessel. Skull: No fracture or focal osseous lesion. Sinuses/Orbits: Visualized paranasal sinuses and mastoid air cells are clear. Visualized orbits are unremarkable. Other: None. IMPRESSION: 1. No evidence of acute intracranial abnormality. 2. Mild-to-moderate chronic small vessel ischemic disease. Electronically Signed   By: Logan Bores M.D.   On: 10/03/2017 13:43    Impression: 1.Anemia, found to have some blood on vomitus, FOBT positive stool,possible upper GI bleed, hemoglobin 2 units less that last admission. 2.Hypotensive on admission.  Plan: 2 units PRBC transfusion ordered. Currently hemodynamically stable. We will  keep him nothing by mouth and plan diagnostic endoscopy in a.m.. On Protonix 40 mg twice a day.   LOS: 0 days   Ronnette Juniper, M.D.  10/03/2017, 4:08 PM  Pager [phone] If no answer or after 5 PM call [phone]

## 2017-10-03 NOTE — Progress Notes (Signed)
PHARMACY NOTE:  ANTIMICROBIAL RENAL DOSAGE ADJUSTMENT  Current antimicrobial regimen includes a mismatch between antimicrobial dosage and estimated renal function.  As per policy approved by the Pharmacy & Therapeutics and Medical Executive Committees, the antimicrobial dosage will be adjusted accordingly.  Current antimicrobial dosage:  Ceftin 250 po BIDWC  Indication: UTI  Renal Function:  Estimated Creatinine Clearance: 16.2 mL/min (A) (by C-G formula based on SCr of 3.62 mg/dL (H)). []      On intermittent HD, scheduled: []      On CRRT    Antimicrobial dosage has been changed to:  Ceftin 250 po daily with supper  Additional comments: Need to address LOT as this was prescribed last discharge on 2/25.   Thank you for allowing pharmacy to be a part of this patient's care.  Brain Hilts, [facility] 10/03/2017 9:09 PM

## 2017-10-03 NOTE — H&P (Addendum)
History and Physical    [name] QIW:[id number] DOB: 10-01-1927 DOA: 10/03/2017   PCP: Bradley Huddle, MD   Patient coming from:  Home    Chief Complaint: Generalized weakness, GI bleed  HPI: Bradley [name] is a 84 y.o. male with a history of hypertension, GERD, pacemaker placement, CKD stage IV, BPH, CHF with EF 30%, CAD, and recent hospitalization for syncope felt to be secondary to UTI, presenting to the emergency department, via EMS, with 1 week history of severe, increasing weakness, having fallen this morning, hitting his head, without syncopal episode.  EMS found the patient hypotensive, receiving 150 mL of fluid, and the patient began to feel better.  EMS noted blood in vomit in the stool.  He was brought to the ED for further evaluation. Patient is a very poor historian, but is able to report that since discharge from the hospital, his symptoms have been worse, his oral intake is decreased.  He is compliant with his medications, having taking his blood pressure meds this morning.  He denies any fever, chills, nausea.  No further episodes of vomiting in the ED.  He has some epigastric pain, but is able to pass flatus.  He denies any worsening shortness of breath.  He denies any chest pain or palpitations.  He denies any dysuria or gross hematuria.  Of note, during his last hospitalization for syncope, he had a new area of cardiac workup, which was essentially nondiagnostic for cardiac etiology of that syncope.  No confusion or seizures are reported.  He admits to significant amount of ice intake.  He denies any tobacco, alcohol, recreational drug use.  He does not take oral supplements of iron.  Most recent use of heparin was for VT prophylaxis during his last hospitalization.  He does not take any an [place].  ED Course:  BP 109/74   Pulse 81   Temp 98.3 F (36.8 C) (Oral)   Resp 18   Ht 6\' 1"  (1.854 m)   Wt 81.6 kg (180 lb)   SpO2 100%   BMI 23.75 kg/m   CT of the head was  negative for acute changes. Lactic acid 1.62. Troponin negative at 0.03. He was typed and screened, and he is receiving 2 units of blood at the ER. BUN 107, was 46 1 week ago Creatinine 3.62, was 2.75 on 09/24/2017  GFR is 60 Glucose 130. White count is 11.4, was 5.5.  Admission. Globin 7.2, 2 g less than, risperidone admission at 9.6.  MCV is 89.8.  Platelets 203. Hemoccult was positive GI consult is pending. The patient is not aware of having had a recent colonoscopy.  Review of Systems:  As per HPI otherwise all other systems reviewed and are negative  Past Medical History:  Diagnosis Date  . Arthritis   . BPH (benign prostatic hypertrophy)   . Cardiomyopathy ([place])    a. thought to be ischemic with high risk MPS EF 23%. Reluctant to do cath due to CKD  . Cataract   . Chronic kidney disease   . Dysrhythmia   . GERD (gastroesophageal reflux disease)   . Hypertension   . PPD positive    a. HX POSITIVE PPD WITH NEGATIVE CXR  . Status post placement of cardiac pacemaker    a. 04/2015: bradycardic arrest s/p Biotronik serial #[id number] pacemaker.  . Strabismus    a. right eye  . Symptomatic bradycardia    a. s/p Biotronik serial [id number] pacemaker.  . Syncope and collapse  a. in 2014 and again in 08/2014. Thought to be vasovagal   . Varicosities of leg     Past Surgical History:  Procedure Laterality Date  . CATARACT EXTRACTION W/ INTRAOCULAR LENS IMPLANT     BOTH EYES  . EP IMPLANTABLE DEVICE N/A 05/13/2015   Procedure: Pacemaker Implant;  Surgeon: Evans Lance, MD;  Location: [facility];  Service: Cardiovascular;  Laterality: N/A;  . TONSILLECTOMY     "I was young" (10/15/2012)  . TOTAL KNEE ARTHROPLASTY Left 12/01/2012   Procedure: LEFT TOTAL KNEE ARTHROPLASTY;  Surgeon: [name], MD;  Location: [facility];  Service: Orthopedics;  Laterality: Left;  . TRANSURETHRAL RESECTION OF PROSTATE  2006    Social History Social History   Socioeconomic History    . Marital status: Married    Spouse name: Not on file  . Number of children: Not on file  . Years of education: Not on file  . Highest education level: Not on file  Social Needs  . Financial resource strain: Not on file  . Food insecurity - worry: Not on file  . Food insecurity - inability: Not on file  . Transportation needs - medical: Not on file  . Transportation needs - non-medical: Not on file  Occupational History  . Occupation: Retired  Tobacco Use  . Smoking status: Never Smoker  . Smokeless tobacco: Never Used  Substance and Sexual Activity  . Alcohol use: No  . Drug use: No  . Sexual activity: No  Other Topics Concern  . Not on file  Social History Narrative   Lives with his wife.     No Known Allergies  Family History  Problem Relation Age of Onset  . Heart failure Mother   . Hypertension Mother   . Pneumonia Father   . Hypertension Brother   . Heart attack Neg Hx   . Stroke Neg Hx       Prior to Admission medications   Medication Sig Start Date End Date Taking? Authorizing Provider  acetaminophen (TYLENOL) 500 MG tablet Take 500 mg by mouth at bedtime as needed for headache (pain).    [provider]  aspirin EC 81 MG tablet Take 1 tablet (81 mg total) by mouth daily. 09/07/14   Belva Crome, MD  carvedilol (COREG) 6.25 MG tablet TAKE 1 TABLET (6.25 MG TOTAL) BY MOUTH 2 (TWO) TIMES DAILY. 05/13/17   Eileen Stanford, PA-C  cefUROXime (CEFTIN) 250 MG tablet Take 1 tablet (250 mg total) by mouth 2 (two) times daily with a meal. 09/24/17   Geradine Girt, DO  cholecalciferol (VITAMIN D) 1000 UNITS tablet Take 1,000 Units by mouth 2 (two) times daily.    [provider]  docusate sodium (COLACE) 100 MG capsule Take 100 mg by mouth 2 (two) times daily as needed for mild constipation.    [provider]  dorzolamide-timolol (COSOPT) 22.3-6.8 MG/ML ophthalmic solution Place 1 drop into both eyes 2 (two) times daily.  08/21/14    [provider]  furosemide (LASIX) 40 MG tablet Take 1 tablet (40 mg total) by mouth daily. Patient taking differently: Take 60 mg by mouth daily.  09/02/17   Belva Crome, MD  hydrALAZINE (APRESOLINE) 25 MG tablet TAKE 1 TABLET (25 MG TOTAL) BY MOUTH 2 (TWO) TIMES DAILY. 02/07/17   Evans Lance, MD  isosorbide mononitrate (IMDUR) 60 MG 24 hr tablet Take 60 mg by mouth daily.    [provider]  latanoprost (XALATAN) 0.005 % ophthalmic solution Place 1 drop into both eyes at bedtime.    [provider]  Multiple Vitamin (MULTIVITAMIN WITH MINERALS) TABS tablet Take 1 tablet by mouth daily.    [provider]  nitroGLYCERIN (NITROSTAT) 0.4 MG SL tablet Place 0.4 mg under the tongue every 5 (five) minutes as needed for chest pain (max 3 doses within 15 minutes call 911).    [provider]  omeprazole (PRILOSEC) 20 MG capsule Take 40 mg by mouth daily. 04/01/16   [provider]    Physical Exam:  Vitals:   10/03/17 1330 10/03/17 1345 10/03/17 1400 10/03/17 1415  BP:  128/60 124/65 109/74  Pulse: 73 74 71 81  Resp: 16 14 16 18   Temp:      TempSrc:      SpO2: 96% 100% 100% 100%  Weight:      Height:       Constitutional: NAD, calm, comfortable , frail. . Temporal wasting noted  Eyes: PERRL, lids and conjunctivae normal  ENMT: Mucous membranes are moist, without exudate or lesions.  Patient is edentulous. Neck: normal, supple, no masses, no thyromegaly Respiratory: clear to auscultation bilaterally, no wheezing, no crackles. Normal respiratory effort  Cardiovascular: Regular rate and rhythm,  murmur, rubs or gallops. No extremity edema. 2+ pedal pulses. No carotid bruits.  Abdomen: Soft, non tender, No hepatosplenomegaly. Bowel sounds positive.  Musculoskeletal: no clubbing / cyanosis. Moves all extremities Skin: no jaundice, No lesions.  Neurologic: Sensation intact  Strength equal in all extremities Psychiatric:   Alert and  oriented x 3. Normal mood.  He speaks in slow sentences.    Labs on Admission: I have personally reviewed following labs and imaging studies  CBC: Recent Labs  Lab 10/03/17 1243  WBC 11.4*  NEUTROABS 9.5*  HGB 7.2*  HCT 22.9*  MCV 89.8  PLT 425    Basic Metabolic Panel: Recent Labs  Lab 10/03/17 1243  NA 138  K 5.3*  CL 110  CO2 18*  GLUCOSE 130*  BUN 107*  CREATININE 3.62*  CALCIUM 7.9*    GFR: Estimated Creatinine Clearance: 16.2 mL/min (A) (by C-G formula based on SCr of 3.62 mg/dL (H)).  Liver Function Tests: Recent Labs  Lab 10/03/17 1243  AST 18  ALT 17  ALKPHOS 43  BILITOT 0.6  PROT 5.2*  ALBUMIN 2.9*   No results for input(s): LIPASE, AMYLASE in the last 168 hours. No results for input(s): AMMONIA in the last 168 hours.  Coagulation Profile: Recent Labs  Lab 10/03/17 1243  INR 1.20    Cardiac Enzymes: No results for input(s): CKTOTAL, CKMB, CKMBINDEX, TROPONINI in the last 168 hours.  BNP (last 3 results) No results for input(s): PROBNP in the last 8760 hours.  HbA1C: No results for input(s): HGBA1C in the last 72 hours.  CBG: No results for input(s): GLUCAP in the last 168 hours.  Lipid Profile: No results for input(s): CHOL, HDL, LDLCALC, TRIG, CHOLHDL, LDLDIRECT in the last 72 hours.  Thyroid Function Tests: No results for input(s): TSH, T4TOTAL, FREET4, T3FREE, THYROIDAB in the last 72 hours.  Anemia Panel: No results for input(s): VITAMINB12, FOLATE, FERRITIN, TIBC, IRON, RETICCTPCT in the last 72 hours.  Urine analysis:    Component Value Date/Time   COLORURINE YELLOW 09/22/2017 2150   APPEARANCEUR CLOUDY (A) 09/22/2017 2150   LABSPEC 1.010 09/22/2017 2150   PHURINE 6.0 09/22/2017 2150   GLUCOSEU NEGATIVE 09/22/2017 2150   HGBUR NEGATIVE 09/22/2017 2150  BILIRUBINUR NEGATIVE 09/22/2017 2150   KETONESUR NEGATIVE 09/22/2017 2150   PROTEINUR NEGATIVE 09/22/2017 2150   UROBILINOGEN 0.2 09/21/2014 1402   NITRITE  NEGATIVE 09/22/2017 2150   LEUKOCYTESUR MODERATE (A) 09/22/2017 2150    Sepsis Labs: @LABRCNTIP (procalcitonin:4,lacticidven:4) ) Recent Results (from the past 240 hour(s))  Culture, Urine     Status: Abnormal   Collection Time: 09/24/17 12:34 AM  Result Value Ref Range Status   Specimen Description URINE, CLEAN CATCH  Final   Special Requests   Final    NONE Performed at [facility], [place] [address], [place], [place] [address]    Culture MULTIPLE SPECIES PRESENT, SUGGEST RECOLLECTION (A)  Final   Report Status 09/25/2017 FINAL  Final     Radiological Exams on Admission: Ct Head Wo Contrast  Result Date: 10/03/2017 CLINICAL DATA:  Fall today.  Recent weakness.  Hypotensive. EXAM: CT HEAD WITHOUT CONTRAST TECHNIQUE: Contiguous axial images were obtained from the base of the skull through the vertex without intravenous contrast. COMPARISON:  09/22/2017 FINDINGS: Brain: There is no evidence of acute infarct, intracranial hemorrhage, mass, midline shift, or extra-axial fluid collection. There is mild cerebral atrophy. Patchy cerebral white matter hypodensities are unchanged and nonspecific but compatible with mild-to-moderate chronic small vessel ischemic disease. Vascular: Extensive calcified atherosclerosis at the skull base. No hyperdense vessel. Skull: No fracture or focal osseous lesion. Sinuses/Orbits: Visualized paranasal sinuses and mastoid air cells are clear. Visualized orbits are unremarkable. Other: None. IMPRESSION: 1. No evidence of acute intracranial abnormality. 2. Mild-to-moderate chronic small vessel ischemic disease. Electronically Signed   By: Logan Bores M.D.   On: 10/03/2017 13:43    EKG: Independently reviewed.  Assessment/Plan Principal Problem:   GIB (gastrointestinal bleeding) Active Problems:   Symptomatic anemia   GERD (gastroesophageal reflux disease)   Osteoarthritis of both knees   Chronic kidney disease, stage IV (severe) (HCC)   Chronic combined  systolic (congestive) and diastolic (congestive) heart failure (HCC)   CAD in native artery   Symptomatic bradycardia   Benign prostatic hyperplasia   Status post placement of cardiac pacemaker   Abdominal pain   Protein-calorie malnutrition, moderate (HCC)    Gastro Intestinal Bleed  Likely higher, BUN is 107.  Hemoglobin is 7.2, significant drop from 1 week ago at 9.6.  Etiology is unknown at this time. IV Protonix given he has been transfused 2 units of blood.  GI evaluation is pending.  Admit to stepdown telemetry  IV protonix twice daily Gentle IVF in view of his low EF at 30% Hold BP meds NPO. If no procedure is planned, can have clear liquids.  Appreciate GI evaluation Hold NSAIDs Check H pylori  Serial CBCs every 6 hours Zofran for nausea. Percocet for pain  Anemia of  GIB, symptomatic  Hemoglobin on admission 7.2 as above, receiving 2 U blood. Hcult + Anemia panel drawn prior to transfusion  Serial CBCs  Follow results of Anemia panel     Frequent falls, likely due to above.  CT of the head is negative.  The patient has a history of syncope and collapse of February 24-26, with negative cardiac workup, due to UTI. His white count currently is 11.4, UA is pending. Continue Ceftin  to complete its course Await urinalysis results. Check orthostatics Hold BP medications today, patient has taken them at home. Neurochecks. Gentle IV fluids. OT and PT in a.m. EKG and troponin Repeat CBC  GERD, no acute symptoms Continue PPI  Acute on Chronic CKD: likely due  to dehydration/ volume loss   diuretics and or NSAIDS.  baseline creatinine 2.9-3.2, currently 3.62   . BUN 107  Lab Results  Component Value Date   CREATININE 3.62 (H) 10/03/2017   CREATININE 2.75 (H) 09/24/2017   CREATININE 2.98 (H) 09/22/2017  Hold diuretics, NSAIDS IVF  Follow electrolytes closely   Chronic combined congestive heart failure.  Last 2D echo on 09/23/2014 showed EF 30-35%, with grade 1  diastolic.  No leg edema.  No JVD.  No shortness of breath at rest.  CHF appears compensated.  Weight 180 pounds. Hold Lasix due to possible dehydration and syncope.  CAD of native artery, the patient is asymptomatic for chest pain.  He was on aspirin, which now is on hold.  Troponin negative  check EKG and troponins  Hypertension BP 134/61   Pulse 70 he last taking his medications this morning. Hold home anti-hypertensive medications in view of active GI bleed   DVT prophylaxis:  SCD Code Status:    Full  Family Communication:  Discussed with patient Disposition Plan: Expect patient to be discharged to home after condition improves Consults called:    GI per EDP Admission status:  SDU    Sharene Butters, PA-C [facility]   Amion text  [phone]   10/03/2017, 3:09 PM

## 2017-10-03 NOTE — Progress Notes (Signed)
Patient trasfered from ED to 5W06 via bed; alert and oriented x 4; no complaints of pain; IV saline locked in hand and blood running in RFA; skin intact. Orient patient to room and unit; gave patient care guide; instructed how to use the call bell and  fall risk precautions. Will continue to monitor the patient.

## 2017-10-03 NOTE — ED Triage Notes (Signed)
Pt from home by [facility] EMS, pt has been feeling weak at home and had a fall this morning pt was found to be hypotensive with EMS they gave 170mL fluid and pt started perking up. EMS states there was blood in his vomit and stool

## 2017-10-04 ENCOUNTER — Encounter (HOSPITAL_COMMUNITY)
Admission: EM | Disposition: A | Discharge status: Skilled Nursing Facility | Payer: Self-pay | Source: Home / Self Care | Attending: Family Medicine

## 2017-10-04 ENCOUNTER — Other Ambulatory Visit: Payer: Self-pay

## 2017-10-04 ENCOUNTER — Inpatient Hospital Stay (HOSPITAL_COMMUNITY): Payer: Medicare Other

## 2017-10-04 ENCOUNTER — Inpatient Hospital Stay (HOSPITAL_COMMUNITY): Payer: Medicare Other | Admitting: Certified Registered"

## 2017-10-04 ENCOUNTER — Encounter (HOSPITAL_COMMUNITY): Payer: Self-pay | Admitting: *Deleted

## 2017-10-04 DIAGNOSIS — I5042 Chronic combined systolic (congestive) and diastolic (congestive) heart failure: Secondary | ICD-10-CM

## 2017-10-04 DIAGNOSIS — N184 Chronic kidney disease, stage 4 (severe): Secondary | ICD-10-CM

## 2017-10-04 DIAGNOSIS — K921 Melena: Secondary | ICD-10-CM

## 2017-10-04 DIAGNOSIS — D649 Anemia, unspecified: Secondary | ICD-10-CM

## 2017-10-04 DIAGNOSIS — E440 Moderate protein-calorie malnutrition: Secondary | ICD-10-CM

## 2017-10-04 DIAGNOSIS — K219 Gastro-esophageal reflux disease without esophagitis: Secondary | ICD-10-CM

## 2017-10-04 DIAGNOSIS — I2510 Atherosclerotic heart disease of native coronary artery without angina pectoris: Secondary | ICD-10-CM

## 2017-10-04 HISTORY — PX: IR ANGIOGRAM VISCERAL SELECTIVE: IMG657

## 2017-10-04 HISTORY — PX: ESOPHAGOGASTRODUODENOSCOPY (EGD) WITH PROPOFOL: SHX5813

## 2017-10-04 HISTORY — PX: IR ANGIOGRAM SELECTIVE EACH ADDITIONAL VESSEL: IMG667

## 2017-10-04 HISTORY — PX: IR US GUIDE VASC ACCESS RIGHT: IMG2390

## 2017-10-04 HISTORY — PX: IR EMBO ART  VEN HEMORR LYMPH EXTRAV  INC GUIDE ROADMAPPING: IMG5450

## 2017-10-04 LAB — CBC
HCT: 27.5 % — ABNORMAL LOW (ref 39.0–52.0)
HCT: 22.5 % — ABNORMAL LOW (ref 39.0–52.0)
HEMATOCRIT: 28.6 % — AB (ref 39.0–52.0)
Hemoglobin: 9.1 g/dL — ABNORMAL LOW (ref 13.0–17.0)
Hemoglobin: 7.4 g/dL — ABNORMAL LOW (ref 13.0–17.0)
Hemoglobin: 9.4 g/dL — ABNORMAL LOW (ref 13.0–17.0)
MCH: 29.7 pg (ref 26.0–34.0)
MCH: 29.7 pg (ref 26.0–34.0)
MCH: 29.2 pg (ref 26.0–34.0)
MCHC: 33.1 g/dL (ref 30.0–36.0)
MCHC: 32.9 g/dL (ref 30.0–36.0)
MCHC: 32.9 g/dL (ref 30.0–36.0)
MCV: 89.9 fL (ref 78.0–100.0)
MCV: 90.4 fL (ref 78.0–100.0)
MCV: 88.8 fL (ref 78.0–100.0)
PLATELETS: 147 10*3/uL — AB (ref 150–400)
PLATELETS: 130 10*3/uL — AB (ref 150–400)
Platelets: 145 10*3/uL — ABNORMAL LOW (ref 150–400)
RBC: 3.06 MIL/uL — AB (ref 4.22–5.81)
RBC: 2.49 MIL/uL — ABNORMAL LOW (ref 4.22–5.81)
RBC: 3.22 MIL/uL — ABNORMAL LOW (ref 4.22–5.81)
RDW: 13.7 % (ref 11.5–15.5)
RDW: 13.6 % (ref 11.5–15.5)
RDW: 14.1 % (ref 11.5–15.5)
WBC: 12.4 10*3/uL — AB (ref 4.0–10.5)
WBC: 12.7 10*3/uL — ABNORMAL HIGH (ref 4.0–10.5)
WBC: 16.8 10*3/uL — AB (ref 4.0–10.5)

## 2017-10-04 LAB — COMPREHENSIVE METABOLIC PANEL
ALBUMIN: 2.4 g/dL — AB (ref 3.5–5.0)
ALK PHOS: 33 U/L — AB (ref 38–126)
ALT: 15 U/L — AB (ref 17–63)
AST: 24 U/L (ref 15–41)
Anion gap: 10 (ref 5–15)
BILIRUBIN TOTAL: 1.2 mg/dL (ref 0.3–1.2)
BUN: 108 mg/dL — ABNORMAL HIGH (ref 6–20)
CALCIUM: 7.6 mg/dL — AB (ref 8.9–10.3)
CO2: 15 mmol/L — ABNORMAL LOW (ref 22–32)
CREATININE: 3.26 mg/dL — AB (ref 0.61–1.24)
Chloride: 116 mmol/L — ABNORMAL HIGH (ref 101–111)
GFR calc Af Amer: 18 mL/min — ABNORMAL LOW (ref >60)
GFR, EST NON AFRICAN AMERICAN: 16 mL/min — AB (ref >60)
GLUCOSE: 128 mg/dL — AB (ref 65–99)
POTASSIUM: 5.9 mmol/L — AB (ref 3.5–5.1)
Sodium: 141 mmol/L (ref 135–145)
TOTAL PROTEIN: 4.5 g/dL — AB (ref 6.5–8.1)

## 2017-10-04 LAB — TROPONIN I
Troponin I: 0.26 ng/mL (ref <0.03)
Troponin I: 1.23 ng/mL (ref <0.03)
Troponin I: 1.12 ng/mL (ref <0.03)

## 2017-10-04 LAB — POTASSIUM: Potassium: 5.8 mmol/L — ABNORMAL HIGH (ref 3.5–5.1)

## 2017-10-04 LAB — PREPARE RBC (CROSSMATCH)

## 2017-10-04 SURGERY — ESOPHAGOGASTRODUODENOSCOPY (EGD) WITH PROPOFOL
Anesthesia: Monitor Anesthesia Care | Laterality: Left

## 2017-10-04 MED ORDER — EPHEDRINE SULFATE-NACL 50-0.9 MG/10ML-% IV SOSY
PREFILLED_SYRINGE | INTRAVENOUS | Status: DC | PRN
  Administered 2017-10-04: 10 mg via INTRAVENOUS

## 2017-10-04 MED ORDER — SODIUM CHLORIDE 0.9 % IV SOLN
Freq: Once | INTRAVENOUS | Status: AC
  Administered 2017-10-04: 250 mL via INTRAVENOUS

## 2017-10-04 MED ORDER — FENTANYL CITRATE (PF) 100 MCG/2ML IJ SOLN
INTRAMUSCULAR | Status: AC
  Filled 2017-10-04: qty 2

## 2017-10-04 MED ORDER — MIDAZOLAM HCL 2 MG/2ML IJ SOLN
INTRAMUSCULAR | Status: AC
  Filled 2017-10-04: qty 4

## 2017-10-04 MED ORDER — SODIUM CHLORIDE 0.9 % IV BOLUS (SEPSIS)
500.0000 mL | Freq: Once | INTRAVENOUS | Status: AC
  Administered 2017-10-04: 500 mL via INTRAVENOUS

## 2017-10-04 MED ORDER — PROPOFOL 500 MG/50ML IV EMUL
INTRAVENOUS | Status: DC | PRN
  Administered 2017-10-04: 25 ug/kg/min via INTRAVENOUS

## 2017-10-04 MED ORDER — PANTOPRAZOLE SODIUM 40 MG IV SOLR
40.0000 mg | Freq: Two times a day (BID) | INTRAVENOUS | Status: DC
  Administered 2017-10-07 – 2017-10-10 (×6): 40 mg via INTRAVENOUS
  Filled 2017-10-04 (×6): qty 40

## 2017-10-04 MED ORDER — BUTAMBEN-TETRACAINE-BENZOCAINE 2-2-14 % EX AERO
INHALATION_SPRAY | CUTANEOUS | Status: DC | PRN
  Administered 2017-10-04: 2 via TOPICAL

## 2017-10-04 MED ORDER — SODIUM CHLORIDE 0.9 % IV SOLN
80.0000 mg | Freq: Once | INTRAVENOUS | Status: AC
  Administered 2017-10-04: 14:00:00 80 mg via INTRAVENOUS
  Filled 2017-10-04: qty 80

## 2017-10-04 MED ORDER — LIDOCAINE HCL 1 % IJ SOLN
INTRAMUSCULAR | Status: AC | PRN
  Administered 2017-10-04: 5 mL

## 2017-10-04 MED ORDER — LIDOCAINE 2% (20 MG/ML) 5 ML SYRINGE
INTRAMUSCULAR | Status: DC | PRN
  Administered 2017-10-04: 100 mg via INTRAVENOUS

## 2017-10-04 MED ORDER — CHLORHEXIDINE GLUCONATE CLOTH 2 % EX PADS
6.0000 | MEDICATED_PAD | Freq: Every day | CUTANEOUS | Status: AC
  Administered 2017-10-04 – 2017-10-08 (×5): 6 via TOPICAL

## 2017-10-04 MED ORDER — FUROSEMIDE 10 MG/ML IJ SOLN
40.0000 mg | Freq: Once | INTRAMUSCULAR | Status: AC
  Administered 2017-10-04: 40 mg via INTRAVENOUS
  Filled 2017-10-04: qty 4

## 2017-10-04 MED ORDER — SODIUM CHLORIDE 0.9 % IV SOLN: INTRAVENOUS | Status: DC

## 2017-10-04 MED ORDER — MIDAZOLAM HCL 2 MG/2ML IJ SOLN
INTRAMUSCULAR | Status: AC | PRN
  Administered 2017-10-04 (×2): 0.5 mg via INTRAVENOUS

## 2017-10-04 MED ORDER — PANTOPRAZOLE SODIUM 40 MG IV SOLR
8.0000 mg/h | INTRAVENOUS | Status: AC
  Administered 2017-10-04 – 2017-10-07 (×7): 8 mg/h via INTRAVENOUS
  Filled 2017-10-04 (×9): qty 80

## 2017-10-04 MED ORDER — MUPIROCIN 2 % EX OINT
1.0000 "application " | TOPICAL_OINTMENT | Freq: Two times a day (BID) | CUTANEOUS | Status: AC
  Administered 2017-10-04 – 2017-10-08 (×10): 1 via NASAL
  Filled 2017-10-04 (×3): qty 22

## 2017-10-04 MED ORDER — IOPAMIDOL (ISOVUE-300) INJECTION 61%
INTRAVENOUS | Status: AC
  Administered 2017-10-04: 15 mL
  Filled 2017-10-04: qty 150

## 2017-10-04 MED ORDER — SODIUM CHLORIDE 0.9 % IJ SOLN
PREFILLED_SYRINGE | INTRAMUSCULAR | Status: DC | PRN
  Administered 2017-10-04: 4 mL

## 2017-10-04 MED ORDER — PROPOFOL 10 MG/ML IV BOLUS
INTRAVENOUS | Status: DC | PRN
  Administered 2017-10-04: 50 mg via INTRAVENOUS

## 2017-10-04 MED ORDER — FENTANYL CITRATE (PF) 100 MCG/2ML IJ SOLN
INTRAMUSCULAR | Status: AC | PRN
  Administered 2017-10-04 (×2): 12.5 ug via INTRAVENOUS

## 2017-10-04 MED ORDER — SODIUM CHLORIDE 0.9 % IV BOLUS (SEPSIS)
1000.0000 mL | Freq: Once | INTRAVENOUS | Status: AC
  Administered 2017-10-04: 1000 mL via INTRAVENOUS

## 2017-10-04 MED ORDER — EPINEPHRINE PF 1 MG/10ML IJ SOSY
PREFILLED_SYRINGE | INTRAMUSCULAR | Status: AC
  Filled 2017-10-04: qty 10

## 2017-10-04 MED ORDER — LIDOCAINE HCL 1 % IJ SOLN
INTRAMUSCULAR | Status: AC
  Filled 2017-10-04: qty 20

## 2017-10-04 NOTE — Sedation Documentation (Signed)
Bedrest starts at 1317- will be until 1617.

## 2017-10-04 NOTE — Sedation Documentation (Signed)
Patient is resting comfortably. 

## 2017-10-04 NOTE — Op Note (Signed)
EGD was performed for hematemesis, melena and anemia. Findings: LA grade B esophagitis noted in mid and distal esophagus along with few superficial, linear ulcers. Widely patent Schatzki's ring. 4 cm hiatal hernia. Unremarkable stomach, cardia and fundus normal on retroflexion.  1 crated 15 mm sized duodenal bulb ulcer noted with a large adherent clot. Margins treated with injection of 1 in 10,000 epinephrine, total 4 mL used. Ulcer appeared to large for Endo Clip placement, elected not to disrupt the clot.  Recommendation: IR guided embolization of GDA. Spoke with the interventional radiologist on call. Protonix IV 8 mg per hour. Patient nothing by mouth. Monitor H&H and transfuse to keep hemoglobin above 7.   Ronnette Juniper, M.D.

## 2017-10-04 NOTE — Progress Notes (Signed)
Call placed [name], patient daughter and POA, telephone consent obtained for EGD with Kr. Therisa Doyne, [place], RN, and Probation officer.

## 2017-10-04 NOTE — Procedures (Signed)
  Procedure: Mesenteric arteriogram, GDA coil embolization   EBL:   minimal Complications:  none immediate  See full dictation in [facility].  Dillard Cannon MD Main # [phone] Pager  [phone]

## 2017-10-04 NOTE — Brief Op Note (Signed)
10/03/2017 - 10/04/2017  12:03 PM  PATIENT:  Bradley [name]  84 y.o. male  PRE-OPERATIVE DIAGNOSIS:  melena, hemetemesis, anemia  POST-OPERATIVE DIAGNOSIS:  schatzki's ring, esophageal ulcers, duodenal bulb ulcer with clot attached - 4cc epi injected  PROCEDURE:  Procedure(s): ESOPHAGOGASTRODUODENOSCOPY (EGD) WITH PROPOFOL (Left)  SURGEON:  Surgeon(s) and Role:    Ronnette Juniper, MD - Primary  PHYSICIAN ASSISTANT:   ASSISTANTS: Vista Lawman, RN, Tinnie Gens Tech ANESTHESIA:   MAC  EBL:  None  BLOOD ADMINISTERED:none  DRAINS: none   LOCAL MEDICATIONS USED:  NONE  SPECIMEN:  No Specimen  DISPOSITION OF SPECIMEN:  N/A  COUNTS:  YES  TOURNIQUET:  * No tourniquets in log *  DICTATION: .Dragon Dictation  PLAN OF CARE: Admit to inpatient   PATIENT DISPOSITION:  PACU - hemodynamically stable.   Delay start of Pharmacological VTE agent (>24hrs) due to surgical blood loss or risk of bleeding: yes

## 2017-10-04 NOTE — Progress Notes (Signed)
Pt had large bouts of bloody stool with drop in BP to 69/52. Pt reported not feeling himself. NP on call was notified. CBC and bolus were ordered. Orders completed. Will continue to monitor.

## 2017-10-04 NOTE — Sedation Documentation (Signed)
IR tech holding pressure to R groin 

## 2017-10-04 NOTE — Progress Notes (Signed)
CRITICAL VALUE ALERT  Critical Value:  Troponin 0.26  Date & Time Notied:  10/04/17; 1:10am  Provider Notified: Silas Sacramento, NP  Orders Received/Actions taken: EKG ordered.

## 2017-10-04 NOTE — Progress Notes (Signed)
OT Cancellation Note  Patient Details Name: Bradley [name] MRN: [id number] DOB: Apr 15, 1928   Cancelled Treatment:    Reason Eval/Treat Not Completed: Other (comment). Pt with strict bed rest order. Will reattempt OT evaluation once pt has increased activity orders.    Tyrone Schimke OTR/L Pager: [phone]  10/04/2017, 8:36 AM

## 2017-10-04 NOTE — Anesthesia Postprocedure Evaluation (Signed)
Anesthesia Post Note  Patient: Bradley [name]  Procedure(s) Performed: ESOPHAGOGASTRODUODENOSCOPY (EGD) WITH PROPOFOL (Left )     Patient location during evaluation: Endoscopy Anesthesia Type: MAC Level of consciousness: awake and alert Pain management: pain level controlled Vital Signs Assessment: post-procedure vital signs reviewed and stable Respiratory status: spontaneous breathing and respiratory function stable Cardiovascular status: stable Postop Assessment: no apparent nausea or vomiting Anesthetic complications: no    Last Vitals:  Vitals:   10/04/17 1300 10/04/17 1309  BP: (!) 152/66 (!) 145/72  Pulse: 75 77  Resp: 18 14  Temp:    SpO2: 100% 100%    Last Pain:  Vitals:   10/04/17 1150  TempSrc: Oral  PainSc:                  [name]

## 2017-10-04 NOTE — Progress Notes (Signed)
Attempted in and out cath, unable to pass catheter easily, attempt aborted, no blood noted on catheter upon removal. Patient tolerated well, denies pain after attempt.

## 2017-10-04 NOTE — Progress Notes (Signed)
The first unit of blood was finished in Endo.

## 2017-10-04 NOTE — Anesthesia Preprocedure Evaluation (Addendum)
Anesthesia Evaluation  Patient identified by MRN, date of birth, ID band Patient awake    Reviewed: Allergy & Precautions, H&P , NPO status , Patient's Chart, lab work & pertinent test results  History of Anesthesia Complications (+) history of anesthetic complications  Airway Mallampati: II  TM Distance: >3 FB Neck ROM: Full    Dental  (+) Edentulous Upper, Edentulous Lower   Pulmonary neg pulmonary ROS,    Pulmonary exam normal breath sounds clear to auscultation       Cardiovascular hypertension, Pt. on medications and Pt. on home beta blockers + CAD and +CHF  (-) Peripheral Vascular Disease Normal cardiovascular exam(-) dysrhythmias + pacemaker  Rhythm:Regular Rate:Normal  Study Conclusions  - Left ventricle: LVEF is severely depressed at approximately 30%   with diffuse hypokinesis; inferior akinesis.. The cavity size was   normal. Wall thickness was increased in a pattern of mild LVH.   Systolic function was severely reduced. The estimated ejection   fraction was in the range of 25% to 30%. Doppler parameters are   consistent with abnormal left ventricular relaxation (grade 1   diastolic dysfunction). - Aortic valve: There was mild regurgitation. - Mitral valve: Calcified annulus. Mildly thickened leaflets . - Left atrium: The atrium was mildly dilated.  Impressions:  - NO significant change in LV function compared to report of 2016.   Neuro/Psych negative neurological ROS  negative psych ROS   GI/Hepatic Neg liver ROS, GERD  ,  Endo/Other  negative endocrine ROS  Renal/GU Renal InsufficiencyRenal disease  negative genitourinary   Musculoskeletal negative musculoskeletal ROS (+)   Abdominal   Peds negative pediatric ROS (+)  Hematology negative hematology ROS (+) anemia ,   Anesthesia Other Findings   Reproductive/Obstetrics negative OB ROS                             Anesthesia Physical  Anesthesia Plan  ASA: III  Anesthesia Plan: MAC   Post-op Pain Management:    Induction:   PONV Risk Score and Plan: 2 and Ondansetron and Propofol infusion  Airway Management Planned: Simple Face Mask, Nasal Cannula and Natural Airway  Additional Equipment:   Intra-op Plan:   Post-operative Plan:   Informed Consent: I have reviewed the patients History and Physical, chart, labs and discussed the procedure including the risks, benefits and alternatives for the proposed anesthesia with the patient or authorized representative who has indicated his/her understanding and acceptance.   Dental advisory given  Plan Discussed with: CRNA and Anesthesiologist  Anesthesia Plan Comments:         Anesthesia Quick Evaluation

## 2017-10-04 NOTE — Sedation Documentation (Signed)
MD placed exoseal to R groin

## 2017-10-04 NOTE — Op Note (Signed)
[facility] Patient Name: Bradley [name] Procedure Date : 10/04/2017 MRN: [id number] Attending MD: Ronnette Juniper , MD Date of Birth: September 15, 1927 CSN: [id number] Age: 84 Admit Type: Inpatient Procedure:                Upper GI endoscopy Indications:              Hematemesis, Melena Providers:                Ronnette Juniper, MD, Vista Lawman, RN, Tinnie Gens,                            Technician Referring MD:              Medicines:                Monitored Anesthesia Care Complications:            No immediate complications. Estimated Blood Loss:     Estimated blood loss: none. Procedure:                Pre-Anesthesia Assessment:                           - Prior to the procedure, a History and Physical                            was performed, and patient medications and                            allergies were reviewed. The patient's tolerance of                            previous anesthesia was also reviewed. The risks                            and benefits of the procedure and the sedation                            options and risks were discussed with the patient.                            All questions were answered, and informed consent                            was obtained. Prior Anticoagulants: The patient has                            taken aspirin, last dose was 2 days prior to                            procedure. ASA Grade Assessment: III - A patient                            with severe systemic disease. After reviewing the  risks and benefits, the patient was deemed in                            satisfactory condition to undergo the procedure.                           After obtaining informed consent, the endoscope was                            passed under direct vision. Throughout the                            procedure, the patient's blood pressure, pulse, and                            oxygen saturations were monitored  continuously. The                            [id number] ([id number]) scope was introduced through the                            mouth, and advanced to the second part of duodenum.                            The [id number]) scope was introduced                            through the and advanced to the. The upper GI                            endoscopy was accomplished without difficulty. The                            patient tolerated the procedure well. Scope In: Scope Out: Findings:      LA Grade B (one or more mucosal breaks greater than 5 mm, not extending       between the tops of two mucosal folds) esophagitis with no bleeding was       found 25 to 38 cm from the incisors.      Few linear and superficial esophageal ulcers with no bleeding and no       stigmata of recent bleeding were found 32 to 38 cm from the incisors.      A widely patent Schatzki ring (acquired) was found in the lower third of       the esophagus.      A 4 cm hiatal hernia was present.      The entire examined stomach was normal.      The cardia and gastric fundus were normal on retroflexion.      One non-bleeding cratered duodenal ulcer with adherent clot was found in       the duodenal bulb. The lesion was 15 mm in largest dimension. Area was       successfully injected with 4 mL of a 1:10,000 solution of epinephrine       for hemostasis. As the ulcer was large, endoclip placement  would not be       possible.      I elected not to distrub the clot. Impression:               - LA Grade B esophagitis.                           - Non-bleeding esophageal ulcers.                           - Widely patent Schatzki ring.                           - 4 cm hiatal hernia.                           - Normal stomach.                           - One non-bleeding duodenal ulcer with adherent                            clot. Injected.                           - No specimens collected. Moderate Sedation:       Patient did not receive moderate sedation for this procedure, but       instead received monitored anesthesia care. Recommendation:           - Refer to an interventional radiologist today.                            Spoke with the interventional radiologist on call,                            plan embolization of GDA.                           Protonix drip at 8 mg/hr, H and H monitoring and                            transfuse to keep Hb above 7.                           - NPO.                           - Continue present medications.                           - Return patient to hospital ward for ongoing care. Procedure Code(s):        --- Professional ---                           [phone], Esophagogastroduodenoscopy, flexible,                            transoral; with control  of bleeding, any method Diagnosis Code(s):        --- Professional ---                           K20.9, Esophagitis, unspecified                           K22.10, Ulcer of esophagus without bleeding                           K22.2, Esophageal obstruction                           K44.9, Diaphragmatic hernia without obstruction or                            gangrene                           K26.4, Chronic or unspecified duodenal ulcer with                            hemorrhage                           K92.0, Hematemesis                           K92.1, Melena (includes Hematochezia) CPT copyright 2016 American [employer]. All rights reserved. The codes documented in this report are preliminary and upon coder review may  be revised to meet current compliance requirements. Ronnette Juniper, MD 10/04/2017 12:01:52 PM This report has been signed electronically. Number of Addenda: 0

## 2017-10-04 NOTE — Progress Notes (Addendum)
Patient back from Endo; alert and oriented x 4; no acute distress noted, no complaints. Right femoral site dressing intact, clean, dry. Will continue to monitor.

## 2017-10-04 NOTE — Transfer of Care (Signed)
Immediate Anesthesia Transfer of Care Note  Patient: Bradley [name]  Procedure(s) Performed: ESOPHAGOGASTRODUODENOSCOPY (EGD) WITH PROPOFOL (Left )  Patient Location: Endoscopy Unit  Anesthesia Type:MAC  Level of Consciousness: drowsy  Airway & Oxygen Therapy: Patient Spontanous Breathing and Patient connected to nasal cannula oxygen  Post-op Assessment: Report given to RN and Post -op Vital signs reviewed and stable  Post vital signs: Reviewed and stable  Last Vitals:  Vitals:   10/04/17 1045 10/04/17 1115  BP: 125/67 112/60  Pulse: 70 72  Resp: 14 12  Temp: 36.6 C 36.7 C  SpO2: 97% 98%    Last Pain:  Vitals:   10/04/17 1115  TempSrc: Oral  PainSc:          Complications: No apparent anesthesia complications

## 2017-10-04 NOTE — Interval H&P Note (Signed)
History and Physical Interval Note: 89/male with vomiting blood, dark stools and anemia for EGD.  10/04/2017 11:12 AM  Bradley [name]  has presented today for EGD, with the diagnosis of melena, hemetemesis, anemia  The various methods of treatment have been discussed with the patient and family. After consideration of risks, benefits and other options for treatment, the patient has consented to  Procedure(s): ESOPHAGOGASTRODUODENOSCOPY (EGD) WITH PROPOFOL (Left) as a surgical intervention .  The patient's history has been reviewed, patient examined, no change in status, stable for surgery.  I have reviewed the patient's chart and labs.  Questions were answered to the patient's satisfaction.     Ronnette Juniper

## 2017-10-04 NOTE — H&P (Signed)
Chief Complaint: Upper GI Bleed  Referring Physician(s): Ronnette Juniper  Supervising Physician: Arne Cleveland  Patient Status: [facility] - In-pt  History of Present Illness: Bradley [name] is a 84 y.o. male with history of cardiomyopathy with pacemaker, chronic kidney disease stage 4, hypertension who presented following an episode of hypotension and fall.    He reported a 1 day history of bloody stools.    He also reports a 1 week history of abdominal pain, for which she had a CT scan on 09/22/2017 which showed diverticulosis.    Patient reports rolling over in bed and passing out.    He is unsure if he passed out before after he fell.    He is confused around the time of the incident.    EMS reported the patient had  an episode of hematemesis in route.    He denied any chest pain, shortness of breath.  He does not drink alcohol.  He was taken to Endoscopy and EGD confirms bleeding duodenal ulcer.  We are asked to perform an urgent arteriogram with embolization.   Past Medical History:  Diagnosis Date  . Arthritis   . BPH (benign prostatic hypertrophy)   . Cardiomyopathy ([place])    a. thought to be ischemic with high risk MPS EF 23%. Reluctant to do cath due to CKD  . Cataract   . Chronic kidney disease   . Dysrhythmia   . GERD (gastroesophageal reflux disease)   . Hypertension   . PPD positive    a. HX POSITIVE PPD WITH NEGATIVE CXR  . Status post placement of cardiac pacemaker    a. 04/2015: bradycardic arrest s/p Biotronik serial #[id number] pacemaker.  . Strabismus    a. right eye  . Symptomatic bradycardia    a. s/p Biotronik serial [id number] pacemaker.  . Syncope and collapse    a. in 2014 and again in 08/2014. Thought to be vasovagal   . Varicosities of leg     Past Surgical History:  Procedure Laterality Date  . CATARACT EXTRACTION W/ INTRAOCULAR LENS IMPLANT     BOTH EYES  . EP IMPLANTABLE DEVICE N/A 05/13/2015   Procedure: Pacemaker Implant;   Surgeon: Evans Lance, MD;  Location: [place] [facility];  Service: Cardiovascular;  Laterality: N/A;  . TONSILLECTOMY     "I was young" (10/15/2012)  . TOTAL KNEE ARTHROPLASTY Left 12/01/2012   Procedure: LEFT TOTAL KNEE ARTHROPLASTY;  Surgeon: [name], MD;  Location: [facility];  Service: Orthopedics;  Laterality: Left;  . TRANSURETHRAL RESECTION OF PROSTATE  2006    Allergies: Patient has no known allergies.  Medications: Prior to Admission medications   Medication Sig Start Date End Date Taking? Authorizing Provider  acetaminophen (TYLENOL) 500 MG tablet Take 500 mg by mouth at bedtime as needed for headache (pain).   Yes [provider]  aspirin EC 81 MG tablet Take 1 tablet (81 mg total) by mouth daily. 09/07/14  Yes Belva Crome, MD  carvedilol (COREG) 6.25 MG tablet TAKE 1 TABLET (6.25 MG TOTAL) BY MOUTH 2 (TWO) TIMES DAILY. 05/13/17  Yes Eileen Stanford, PA-C  cholecalciferol (VITAMIN D) 1000 UNITS tablet Take 1,000 Units by mouth 2 (two) times daily.   Yes [provider]  docusate sodium (COLACE) 100 MG capsule Take 100 mg by mouth 2 (two) times daily as needed for mild constipation.   Yes [provider]  dorzolamide-timolol (COSOPT) 22.3-6.8 MG/ML ophthalmic solution Place 1 drop into both eyes  2 (two) times daily.  08/21/14  Yes [provider]  furosemide (LASIX) 40 MG tablet Take 1 tablet (40 mg total) by mouth daily. 09/02/17  Yes Belva Crome, MD  hydrALAZINE (APRESOLINE) 25 MG tablet TAKE 1 TABLET (25 MG TOTAL) BY MOUTH 2 (TWO) TIMES DAILY. 02/07/17  Yes Evans Lance, MD  isosorbide mononitrate (IMDUR) 60 MG 24 hr tablet Take 60 mg by mouth daily.   Yes [provider]  latanoprost (XALATAN) 0.005 % ophthalmic solution Place 1 drop into both eyes at bedtime.   Yes [provider]  Multiple Vitamin (MULTIVITAMIN WITH MINERALS) TABS tablet Take 1 tablet by mouth daily.   Yes [provider]  nitroGLYCERIN  (NITROSTAT) 0.4 MG SL tablet Place 0.4 mg under the tongue every 5 (five) minutes as needed for chest pain (max 3 doses within 15 minutes call 911).   Yes [provider]     Family History  Problem Relation Age of Onset  . Heart failure Mother   . Hypertension Mother   . Pneumonia Father   . Hypertension Brother   . Heart attack Neg Hx   . Stroke Neg Hx     Social History   Socioeconomic History  . Marital status: Married    Spouse name: None  . Number of children: None  . Years of education: None  . Highest education level: None  Social Needs  . Financial resource strain: None  . Food insecurity - worry: None  . Food insecurity - inability: None  . Transportation needs - medical: None  . Transportation needs - non-medical: None  Occupational History  . Occupation: Retired  Tobacco Use  . Smoking status: Never Smoker  . Smokeless tobacco: Never Used  Substance and Sexual Activity  . Alcohol use: No  . Drug use: No  . Sexual activity: No  Other Topics Concern  . None  Social History Narrative   Lives with his wife.    Review of Systems: A 12 point ROS discussed and pertinent positives are indicated in the HPI above.  All other systems are negative. Review of Systems   Vital Signs: BP (!) 97/32   Pulse 68   Temp 97.8 F (36.6 C) (Oral)   Resp (!) 21   Ht 6\' 1"  (1.854 m)   Wt 181 lb 10.5 oz (82.4 kg)   SpO2 96%   BMI 23.97 kg/m   Physical Exam  Constitutional: He appears well-developed.  HENT:  Head: Normocephalic and atraumatic.  Eyes: EOM are normal.  Neck: Normal range of motion.  Cardiovascular: Normal rate and regular rhythm.  1+ right femoral pulse, 2+ left femoral pulse  Pulmonary/Chest: Effort normal.  Abdominal: Soft.  Neurological: He is alert.  Skin: Skin is warm and dry.  Psychiatric: His behavior is normal. Judgment and thought content normal.  He had sedation for his EGD, however is aware of what is happening to him. He asked  that we do everything for him.  Vitals reviewed.   Imaging: Ct Abdomen Pelvis Wo Contrast  Result Date: 09/23/2017 CLINICAL DATA:  84 year old male with right lower quadrant abdominal pain. EXAM: CT ABDOMEN AND PELVIS WITHOUT CONTRAST TECHNIQUE: Multidetector CT imaging of the abdomen and pelvis was performed following the standard protocol without IV contrast. COMPARISON:  Renal ultrasound dated 09/27/2016 FINDINGS: Evaluation of this exam is limited in the absence of intravenous contrast. Lower chest: Minimal bibasilar linear atelectasis/scarring. The visualized lung bases are otherwise clear. There is coronary vascular  calcification and cardiac pacemaker wire. No intra-abdominal free air or free fluid. Hepatobiliary: The liver is unremarkable. No intrahepatic biliary ductal dilatation. Small gallstones. No pericholecystic fluid or evidence of acute cholecystitis by CT. Pancreas: Unremarkable. No pancreatic ductal dilatation or surrounding inflammatory changes. Spleen: Normal in size without focal abnormality. Adrenals/Urinary Tract: Mild bilateral renal parenchyma atrophy. Small bilateral exophytic hypodense lesions are not characterized on this noncontrast CT. There is no hydronephrosis or nephrolithiasis on either side. The visualized ureters are unremarkable. There is trabeculation of the bladder wall with multiple diverticula measuring up to 5 cm from the left lateral bladder wall. Stomach/Bowel: There is no bowel obstruction or active inflammation. There are scattered sigmoid, chronic, and distal small bowel diverticula without active inflammatory changes. Normal appendix. Vascular/Lymphatic: Moderate aortoiliac atherosclerotic disease. No portal venous gas. There is no adenopathy. Reproductive: The prostate and seminal vesicles are grossly unremarkable. No pelvic mass Other: Small fat containing umbilical hernia. Musculoskeletal: Degenerative changes of the spine as well as multilevel disc  desiccation. No acute osseous pathology. IMPRESSION: 1. Cholelithiasis. 2. Colonic and distal small bowel diverticulosis. No bowel obstruction or active inflammation. Normal appendix. 3. Trabeculated urinary bladder with multiple large diverticula. Electronically Signed   By: Anner Crete M.D.   On: 09/23/2017 00:08   Ct Head Wo Contrast  Result Date: 10/03/2017 CLINICAL DATA:  Fall today.  Recent weakness.  Hypotensive. EXAM: CT HEAD WITHOUT CONTRAST TECHNIQUE: Contiguous axial images were obtained from the base of the skull through the vertex without intravenous contrast. COMPARISON:  09/22/2017 FINDINGS: Brain: There is no evidence of acute infarct, intracranial hemorrhage, mass, midline shift, or extra-axial fluid collection. There is mild cerebral atrophy. Patchy cerebral white matter hypodensities are unchanged and nonspecific but compatible with mild-to-moderate chronic small vessel ischemic disease. Vascular: Extensive calcified atherosclerosis at the skull base. No hyperdense vessel. Skull: No fracture or focal osseous lesion. Sinuses/Orbits: Visualized paranasal sinuses and mastoid air cells are clear. Visualized orbits are unremarkable. Other: None. IMPRESSION: 1. No evidence of acute intracranial abnormality. 2. Mild-to-moderate chronic small vessel ischemic disease. Electronically Signed   By: Logan Bores M.D.   On: 10/03/2017 13:43   Ct Head Wo Contrast  Result Date: 09/22/2017 CLINICAL DATA:  Syncope, nausea, vomiting EXAM: CT HEAD WITHOUT CONTRAST TECHNIQUE: Contiguous axial images were obtained from the base of the skull through the vertex without intravenous contrast. COMPARISON:  09/21/2014 FINDINGS: Brain: Age related volume loss. Mild chronic small vessel disease. No acute intracranial abnormality. Specifically, no hemorrhage, hydrocephalus, mass lesion, acute infarction, or significant intracranial injury. Vascular: No hyperdense vessel or unexpected calcification. Skull: No acute  calvarial abnormality. Sinuses/Orbits: Complete opacification of the right maxillary sinus with bulging of the medial wall, new since prior study. Mastoid air cells are clear. Orbital soft tissues unremarkable. Other: None IMPRESSION: No acute intracranial abnormality. Atrophy, chronic microvascular disease. Right maxillary sinus disease as above. Electronically Signed   By: Rolm Baptise M.D.   On: 09/22/2017 19:37   Dg Chest Port 1 View  Result Date: 09/22/2017 CLINICAL DATA:  syncopal episode this afternoon. pt was out for 3 minutes. Pt has a demand pacemaker, per EMS pt had pacemaker placed d/t episodes of syncope in the past. Pt has hx of frequent PVCs.Pt Complains of epigastric discomfort. No SOBN EXAM: PORTABLE CHEST 1 VIEW COMPARISON:  05/14/2015 FINDINGS: Cardiac silhouette is normal in size. No mediastinal or hilar masses. No evidence of adenopathy. Left anterior chest wall sequential pacemaker is stable, leads projecting over the right atrium  and right ventricle. Clear lungs. Stable elevation the right hemidiaphragm. No convincing pleural effusion. No pneumothorax. Skeletal structures are grossly intact. IMPRESSION: No acute cardiopulmonary disease. Electronically Signed   By: Lajean Manes M.D.   On: 09/22/2017 18:50    Labs:  CBC: Recent Labs    09/24/17 0908 10/03/17 1243 10/03/17 2329 10/04/17 0533  WBC 5.5 11.4* 12.4* 12.7*  HGB 9.6* 7.2* 9.1* 7.4*  HCT 30.6* 22.9* 27.5* 22.5*  PLT 177 203 147* 145*    COAGS: Recent Labs    10/03/17 1243  INR 1.20    BMP: Recent Labs    09/22/17 1816 09/24/17 0908 10/03/17 1243 10/04/17 0533  NA 135 137 138 141  K 4.7 4.7 5.3* 5.9*  CL 106 106 110 116*  CO2 21* 20* 18* 15*  GLUCOSE 114* 112* 130* 128*  BUN 48* 46* 107* 108*  CALCIUM 8.2* 8.4* 7.9* 7.6*  CREATININE 2.98* 2.75* 3.62* 3.26*  GFRNONAA 17* 19* 14* 16*  GFRAA 20* 22* 16* 18*    LIVER FUNCTION TESTS: Recent Labs    09/22/17 1816 10/03/17 1243 10/04/17 0533   BILITOT 0.5 0.6 1.2  AST 14* 18 24  ALT 10* 17 15*  ALKPHOS 62 43 33*  PROT 6.0* 5.2* 4.5*  ALBUMIN 3.1* 2.9* 2.4*    TUMOR MARKERS: No results for input(s): AFPTM, CEA, CA199, CHROMGRNA in the last 8760 hours.  Assessment and Plan:  Upper GI Bleed   Duodenal ulcer seen on Endoscopy  Will proceed with arteriogram and embolization.  The Risks and benefits of embolization were discussed with the patient and his daughter including, but not limited to bleeding, infection, vascular injury, post operative pain, or contrast induced renal failure.  This procedure involves the use of X-rays and because of the nature of the planned procedure, it is possible that we will have prolonged use of X-ray fluoroscopy.  Potential radiation risks to you include (but are not limited to) the following: - A slightly elevated risk for cancer several years later in life. This risk is typically less than 0.5% percent. This risk is low in comparison to the normal incidence of human cancer, which is 33% for women and 50% for men according to the [place]. - Radiation induced injury can include skin redness, resembling a rash, tissue breakdown / ulcers and hair loss (which can be temporary or permanent).  The likelihood of either of these occurring depends on the difficulty of the procedure and whether you are sensitive to radiation due to previous procedures, disease, or genetic conditions.  IF your procedure requires a prolonged use of radiation, you will be notified and given written instructions for further action. It is your responsibility to monitor the irradiated area for the 2 weeks following the procedure and to notify your physician if you are concerned that you have suffered a radiation induced injury.   All of the patient's and his daughter's questions were answered, patient is agreeable to proceed. Consent signed and in chart.  He does have CKD stage 4 with creatinine of 3.   His daughter states he is not a dialysis candidate due to his heart failure.  We will dilute the contrast and use hand injection and do everything we can to minimize contrast nephropathy.  Thank you for this interesting consult.  I greatly enjoyed meeting Teryl A Memmott and look forward to participating in their care.  A copy of this report was sent to the requesting provider on this date.  Electronically Signed: Murrell Redden, PA-C 10/04/2017, 11:57 AM   I spent a total of 40 Minutes in face to face in clinical consultation, greater than 50% of which was counseling/coordinating care for embolization for upper GI Bleed.

## 2017-10-04 NOTE — Progress Notes (Signed)
PROGRESS NOTE    Bradley [name]  PJK:[id number] DOB: [birth year] DOA: 10/03/2017 PCP: [name], MD   Brief Narrative: Bradley [name] is a 84 y.o. male with a history of hypertension, GERD, pacemaker placement, CKD stage IV, BPH, CHF with EF 30%, CAD. He presented with weakness and GI bleeding. Patient with frequent hematochezia. GI consulted for evaluation and management. Troponin elevated with EKG changes. Cardiology consulted.   Assessment & Plan:   Principal Problem:   GIB (gastrointestinal bleeding) Active Problems:   GERD (gastroesophageal reflux disease)   Osteoarthritis of both knees   Chronic kidney disease, stage IV (severe) (HCC)   Chronic combined systolic (congestive) and diastolic (congestive) heart failure (HCC)   CAD in native artery   Symptomatic bradycardia   Benign prostatic hyperplasia   Status post placement of cardiac pacemaker   Abdominal pain   Symptomatic anemia   Protein-calorie malnutrition, moderate (HCC)   GI bleed Recurrent bleeding overnight requiring another unit of blood. GI recommendations: EGD attempted, recommended IR intervention for embolization -PPI -Transfuse for symptoms or hemoglobin <8  Acute blood loss anemia Secondary to GI bleeding. -Transfusions as above  Frequent falls Likely secondary to blood loss. CT negative. Recently diagnosed with UTI. Completed course.  Elevated troponin Increasing from this morning. EKG reviewed and more pronounced lateral lead t-wave inversions. Asymptomatic. -stat troponin -Cardiology consult -Holding heparin in setting of significant GI bleeding  GERD Stable -Continue protonix  Acute on chronic CKD 4 Secondary to hypovolemia from acute blood loss -recheck BMP -Fluid resuscitation   CAD Stable. No chest pain. Troponin elevated. No chest pain. -Aspirin on hold secondary to significant GI bleeding  Essential hypertension Coreg, lasix, hydralazine, Imdur on hold secondary to  GI bleeding and hypotension  Hyperkalemia Potassium elevated. -Lasix/595mL bolus -Recheck potassium  Chronic combined systolic and diastolic heart failure EF of 25-30% with diffuse hypokinesis and inferior akinesis   DVT prophylaxis: SCDs Code Status:   Code Status: Full Code Family Communication: None at bedside Disposition Plan: Discharge home pending GI workup/management   Consultants:   Gastroenterology  Procedures:   None  Antimicrobials:  None    Subjective: No concerns today. Feels fine.  Objective: Vitals:   10/04/17 1210 10/04/17 1238 10/04/17 1241 10/04/17 1245  BP: 106/65 135/73 132/68 128/66  Pulse: 80 66 72 68  Resp: 19 17 16 17   Temp:      TempSrc:      SpO2: 100% 100% 100% 100%  Weight:      Height:        Intake/Output Summary (Last 24 hours) at 10/04/2017 1249 Last data filed at 10/04/2017 1142 Gross per 24 hour  Intake 1488.67 ml  Output 600 ml  Net 888.67 ml   Filed Weights   10/03/17 1731 10/04/17 0639 10/04/17 1045  Weight: 81.3 kg (179 lb 3.7 oz) 82.4 kg (181 lb 10.5 oz) 82.4 kg (181 lb 10.5 oz)    Examination:  General exam: Appears calm and comfortable HEENT: strabismus Respiratory system: Respiratory effort normal. Gastrointestinal system: Abdomen is nondistended, soft and nontender. No organomegaly or masses felt. Central nervous system: Alert and oriented to person and place. Extremities: No edema. No calf tenderness Skin: No cyanosis. No rashes Psychiatry: Judgement and insight appear normal. Flat affect    Data Reviewed: I have personally reviewed following labs and imaging studies  CBC: Recent Labs  Lab 10/03/17 1243 10/03/17 2329 10/04/17 0533  WBC 11.4* 12.4* 12.7*  NEUTROABS 9.5*  --   --  HGB 7.2* 9.1* 7.4*  HCT 22.9* 27.5* 22.5*  MCV 89.8 89.9 90.4  PLT 203 147* 213*   Basic Metabolic Panel: Recent Labs  Lab 10/03/17 1243 10/04/17 0533  NA 138 141  K 5.3* 5.9*  CL 110 116*  CO2 18* 15*    GLUCOSE 130* 128*  BUN 107* 108*  CREATININE 3.62* 3.26*  CALCIUM 7.9* 7.6*   GFR: Estimated Creatinine Clearance: 18 mL/min (A) (by C-G formula based on SCr of 3.26 mg/dL (H)). Liver Function Tests: Recent Labs  Lab 10/03/17 1243 10/04/17 0533  AST 18 24  ALT 17 15*  ALKPHOS 43 33*  BILITOT 0.6 1.2  PROT 5.2* 4.5*  ALBUMIN 2.9* 2.4*   No results for input(s): LIPASE, AMYLASE in the last 168 hours. No results for input(s): AMMONIA in the last 168 hours. Coagulation Profile: Recent Labs  Lab 10/03/17 1243  INR 1.20   Cardiac Enzymes: Recent Labs  Lab 10/03/17 2329 10/04/17 0533  TROPONINI 0.26* 1.23*   BNP (last 3 results) No results for input(s): PROBNP in the last 8760 hours. HbA1C: No results for input(s): HGBA1C in the last 72 hours. CBG: No results for input(s): GLUCAP in the last 168 hours. Lipid Profile: No results for input(s): CHOL, HDL, LDLCALC, TRIG, CHOLHDL, LDLDIRECT in the last 72 hours. Thyroid Function Tests: Recent Labs    10/03/17 1429  TSH 2.621   Anemia Panel: Recent Labs    10/03/17 1429  VITAMINB12 342  FOLATE 8.4  FERRITIN 93  TIBC 227*  IRON 63  RETICCTPCT 2.2   Sepsis Labs: Recent Labs  Lab 10/03/17 1257  LATICACIDVEN 1.62    Recent Results (from the past 240 hour(s))  MRSA PCR Screening     Status: Abnormal   Collection Time: 10/03/17  4:58 PM  Result Value Ref Range Status   MRSA by PCR POSITIVE (A) NEGATIVE Final    Comment:        The GeneXpert MRSA Assay (FDA approved for NASAL specimens only), is one component of a comprehensive MRSA colonization surveillance program. It is not intended to diagnose MRSA infection nor to guide or monitor treatment for MRSA infections. RESULT CALLED TO, READ BACK BY AND VERIFIED WITH: O.[email or web address],RN AT 2152 BY L.PITT 10/03/17          Radiology Studies: Ct Head Wo Contrast  Result Date: 10/03/2017 CLINICAL DATA:  Fall today.  Recent weakness.  Hypotensive. EXAM: CT  HEAD WITHOUT CONTRAST TECHNIQUE: Contiguous axial images were obtained from the base of the skull through the vertex without intravenous contrast. COMPARISON:  09/22/2017 FINDINGS: Brain: There is no evidence of acute infarct, intracranial hemorrhage, mass, midline shift, or extra-axial fluid collection. There is mild cerebral atrophy. Patchy cerebral white matter hypodensities are unchanged and nonspecific but compatible with mild-to-moderate chronic small vessel ischemic disease. Vascular: Extensive calcified atherosclerosis at the skull base. No hyperdense vessel. Skull: No fracture or focal osseous lesion. Sinuses/Orbits: Visualized paranasal sinuses and mastoid air cells are clear. Visualized orbits are unremarkable. Other: None. IMPRESSION: 1. No evidence of acute intracranial abnormality. 2. Mild-to-moderate chronic small vessel ischemic disease. Electronically Signed   By: Logan Bores M.D.   On: 10/03/2017 13:43        Scheduled Meds: . cefUROXime  250 mg Oral Q supper  . Chlorhexidine Gluconate Cloth  6 each Topical Q0600  . cholecalciferol  1,000 Units Oral BID  . dorzolamide  1 drop Both Eyes BID   And  . timolol  1 drop Both  Eyes BID  . fentaNYL      . iopamidol      . latanoprost  1 drop Both Eyes QHS  . lidocaine      . midazolam      . mupirocin ointment  1 application Nasal BID  . [START ON 10/07/2017] pantoprazole  40 mg Intravenous Q12H   Continuous Infusions: . sodium chloride 1,000 mL (10/04/17 0833)  . pantoprazole (PROTONIX) IVPB    . pantoprozole (PROTONIX) infusion       LOS: 1 day     Cordelia Poche, MD [facility] 10/04/2017, 12:49 PM Pager: [phone]  If 7PM-7AM, please contact night-coverage [email or web address] Password [facility] 10/04/2017, 12:49 PM

## 2017-10-04 NOTE — Progress Notes (Signed)
PT Cancellation Note  Patient Details Name: Bradley [name] MRN: [id number] DOB: August 02, 1927   Cancelled Treatment:    Reason Eval/Treat Not Completed: Active bedrest order Will follow up once activity orders are increased for PT evaluation.   Marguarite Arbour A Linda Grimmer 10/04/2017, 8:07 AM Wray Kearns, PT, DPT [phone]

## 2017-10-04 NOTE — Consult Note (Signed)
Cardiology Consultation:   Patient ID: Bradley [name]; [id number]; 03-Aug-1927   Admit date: 10/03/2017 Date of Consult: 10/04/2017  Primary Care Provider: [name], MD Primary Cardiologist: Tamala Julian  Primary Electrophysiologist:   Lovena Le    Patient Profile:   Bradley [name] is a 84 y.o. male with a hx of heart failure, coronary artery disease, chronic kidney disease, hypertension and pacemaker who is being seen today for the evaluation of syncope and elevated troponin level at the request of Dr. Lonny Prude .  History of Present Illness:   Bradley [name] is an 84 year old gentleman with a history of coronary artery disease and systolic congestive heart failure.  He is seen regularly by Dr. Pernell Dupre. His last Myoview study was February, 2016 which revealed the presence of a large inferolateral myocardial infarction with a hypocontractile and dilated left ventricle.  Echocardiogram in February, 2019 revealed severely reduced left ventricular systolic function with ejection fraction of 25-30%.  He has grade 1 diastolic dysfunction.  Mild mitral regurgitation.  He was admitted with progressive weakness for the prior week.  He fell in the morning of the admission but denied having syncope.  He went to the emergency room and was found to have a hemoglobin of 7.2.  He was transfused a couple units of blood and had a hemoglobin of 9.1 but then later continued bleeding and had a hemoglobin of 7.4.  EGD today revealed a large duodenal bulb ulcer with an adherent clot.  It was injected with epinephrine. Patient has been on aspirin 81 mg a day.  Past Medical History:  Diagnosis Date  . Arthritis   . BPH (benign prostatic hypertrophy)   . Cardiomyopathy ([place])    a. thought to be ischemic with high risk MPS EF 23%. Reluctant to do cath due to CKD  . Cataract   . Chronic kidney disease   . Dysrhythmia   . GERD (gastroesophageal reflux disease)   . Hypertension   . PPD positive    a. HX POSITIVE  PPD WITH NEGATIVE CXR  . Status post placement of cardiac pacemaker    a. 04/2015: bradycardic arrest s/p Biotronik serial #[id number] pacemaker.  . Strabismus    a. right eye  . Symptomatic bradycardia    a. s/p Biotronik serial [id number] pacemaker.  . Syncope and collapse    a. in 2014 and again in 08/2014. Thought to be vasovagal   . Varicosities of leg     Past Surgical History:  Procedure Laterality Date  . CATARACT EXTRACTION W/ INTRAOCULAR LENS IMPLANT     BOTH EYES  . EP IMPLANTABLE DEVICE N/A 05/13/2015   Procedure: Pacemaker Implant;  Surgeon: Evans Lance, MD;  Location: [facility];  Service: Cardiovascular;  Laterality: N/A;  . IR ANGIOGRAM SELECTIVE EACH ADDITIONAL VESSEL  10/04/2017  . IR ANGIOGRAM VISCERAL SELECTIVE  10/04/2017  . IR EMBO ART  VEN HEMORR LYMPH EXTRAV  INC GUIDE ROADMAPPING  10/04/2017  . IR US GUIDE VASC ACCESS RIGHT  10/04/2017  . TONSILLECTOMY     "I was young" (10/15/2012)  . TOTAL KNEE ARTHROPLASTY Left 12/01/2012   Procedure: LEFT TOTAL KNEE ARTHROPLASTY;  Surgeon: [name], MD;  Location: [facility];  Service: Orthopedics;  Laterality: Left;  . TRANSURETHRAL RESECTION OF PROSTATE  2006     Home Medications:  Prior to Admission medications   Medication Sig Start Date End Date Taking? Authorizing Provider  acetaminophen (TYLENOL) 500 MG tablet Take 500 mg by mouth at bedtime  as needed for headache (pain).   Yes [provider]  aspirin EC 81 MG tablet Take 1 tablet (81 mg total) by mouth daily. 09/07/14  Yes Belva Crome, MD  carvedilol (COREG) 6.25 MG tablet TAKE 1 TABLET (6.25 MG TOTAL) BY MOUTH 2 (TWO) TIMES DAILY. 05/13/17  Yes Eileen Stanford, PA-C  cholecalciferol (VITAMIN D) 1000 UNITS tablet Take 1,000 Units by mouth 2 (two) times daily.   Yes [provider]  docusate sodium (COLACE) 100 MG capsule Take 100 mg by mouth 2 (two) times daily as needed for mild constipation.   Yes [provider]    dorzolamide-timolol (COSOPT) 22.3-6.8 MG/ML ophthalmic solution Place 1 drop into both eyes 2 (two) times daily.  08/21/14  Yes [provider]  furosemide (LASIX) 40 MG tablet Take 1 tablet (40 mg total) by mouth daily. 09/02/17  Yes Belva Crome, MD  hydrALAZINE (APRESOLINE) 25 MG tablet TAKE 1 TABLET (25 MG TOTAL) BY MOUTH 2 (TWO) TIMES DAILY. 02/07/17  Yes Evans Lance, MD  isosorbide mononitrate (IMDUR) 60 MG 24 hr tablet Take 60 mg by mouth daily.   Yes [provider]  latanoprost (XALATAN) 0.005 % ophthalmic solution Place 1 drop into both eyes at bedtime.   Yes [provider]  Multiple Vitamin (MULTIVITAMIN WITH MINERALS) TABS tablet Take 1 tablet by mouth daily.   Yes [provider]  nitroGLYCERIN (NITROSTAT) 0.4 MG SL tablet Place 0.4 mg under the tongue every 5 (five) minutes as needed for chest pain (max 3 doses within 15 minutes call 911).   Yes [provider]    Inpatient Medications: Scheduled Meds: . Chlorhexidine Gluconate Cloth  6 each Topical Q0600  . cholecalciferol  1,000 Units Oral BID  . dorzolamide  1 drop Both Eyes BID   And  . timolol  1 drop Both Eyes BID  . fentaNYL      . latanoprost  1 drop Both Eyes QHS  . lidocaine      . midazolam      . mupirocin ointment  1 application Nasal BID  . [START ON 10/07/2017] pantoprazole  40 mg Intravenous Q12H   Continuous Infusions: . sodium chloride 1,000 mL (10/04/17 1401)  . pantoprozole (PROTONIX) infusion 8 mg/hr (10/04/17 1351)   PRN Meds: acetaminophen **OR** acetaminophen, bisacodyl, HYDROcodone-acetaminophen, ondansetron **OR** ondansetron (ZOFRAN) IV, senna-docusate  Allergies:   No Known Allergies  Social History:   Social History   Socioeconomic History  . Marital status: Married    Spouse name: Not on file  . Number of children: Not on file  . Years of education: Not on file  . Highest education level: Not on file  Social Needs  . Financial  resource strain: Not on file  . Food insecurity - worry: Not on file  . Food insecurity - inability: Not on file  . Transportation needs - medical: Not on file  . Transportation needs - non-medical: Not on file  Occupational History  . Occupation: Retired  Tobacco Use  . Smoking status: Never Smoker  . Smokeless tobacco: Never Used  Substance and Sexual Activity  . Alcohol use: No  . Drug use: No  . Sexual activity: No  Other Topics Concern  . Not on file  Social History Narrative   Lives with his wife.    Family History:    Family History  Problem Relation Age of Onset  . Heart failure Mother   . Hypertension Mother   .  Pneumonia Father   . Hypertension Brother   . Heart attack Neg Hx   . Stroke Neg Hx      ROS:  Please see the history of present illness.   All other ROS reviewed and negative.     Physical Exam/Data:   Vitals:   10/04/17 1401 10/04/17 1430 10/04/17 1613 10/04/17 1644  BP: 133/70 117/68  130/64  Pulse:  73    Resp:  13    Temp: 98.5 F (36.9 C) 98.3 F (36.8 C) 98.4 F (36.9 C) 98.6 F (37 C)  TempSrc: Oral Oral Oral Oral  SpO2:  100%    Weight:      Height:        Intake/Output Summary (Last 24 hours) at 10/04/2017 1713 Last data filed at 10/04/2017 1644 Gross per 24 hour  Intake 1476.67 ml  Output 600 ml  Net 876.67 ml   Filed Weights   10/03/17 1731 10/04/17 0639 10/04/17 1045  Weight: 179 lb 3.7 oz (81.3 kg) 181 lb 10.5 oz (82.4 kg) 181 lb 10.5 oz (82.4 kg)   Body mass index is 23.97 kg/m.  General: Elderly gentleman, appears to be chronically ill. HEENT: normal Lymph: no adenopathy Neck: no JVD Endocrine:  No thryomegaly Vascular: No carotid bruits; FA pulses 2+ bilaterally without bruits  Cardiac: Regular rate S1-S2. Lungs:  clear to auscultation bilaterally, no wheezing, rhonchi or rales  Abd: soft, nontender, no hepatomegaly  Ext: no edema Musculoskeletal:  No deformities, BUE and BLE strength normal and equal Skin:  warm and dry  Neuro:  CNs 2-12 intact, no focal abnormalities noted Psych: Seems to have memory impairment.  EKG:  The EKG was personally reviewed and demonstrates: Normal sinus rhythm.  Unfortunately T wave inversions in the lateral leads. Telemetry:    Relevant CV Studies:   Laboratory Data:  Chemistry Recent Labs  Lab 10/03/17 1243 10/04/17 0533  NA 138 141  K 5.3* 5.9*  CL 110 116*  CO2 18* 15*  GLUCOSE 130* 128*  BUN 107* 108*  CREATININE 3.62* 3.26*  CALCIUM 7.9* 7.6*  GFRNONAA 14* 16*  GFRAA 16* 18*  ANIONGAP 10 10    Recent Labs  Lab 10/03/17 1243 10/04/17 0533  PROT 5.2* 4.5*  ALBUMIN 2.9* 2.4*  AST 18 24  ALT 17 15*  ALKPHOS 43 33*  BILITOT 0.6 1.2   Hematology Recent Labs  Lab 10/03/17 1243 10/03/17 1429 10/03/17 2329 10/04/17 0533  WBC 11.4*  --  12.4* 12.7*  RBC 2.55* 2.47* 3.06* 2.49*  HGB 7.2*  --  9.1* 7.4*  HCT 22.9*  --  27.5* 22.5*  MCV 89.8  --  89.9 90.4  MCH 28.2  --  29.7 29.7  MCHC 31.4  --  33.1 32.9  RDW 13.4  --  13.7 13.6  PLT 203  --  147* 145*   Cardiac Enzymes Recent Labs  Lab 10/03/17 2329 10/04/17 0533  TROPONINI 0.26* 1.23*    Recent Labs  Lab 10/03/17 1254  TROPIPOC 0.03    BNPNo results for input(s): BNP, PROBNP in the last 168 hours.  DDimer No results for input(s): DDIMER in the last 168 hours.  Radiology/Studies:  Ct Head Wo Contrast  Result Date: 10/03/2017 CLINICAL DATA:  Fall today.  Recent weakness.  Hypotensive. EXAM: CT HEAD WITHOUT CONTRAST TECHNIQUE: Contiguous axial images were obtained from the base of the skull through the vertex without intravenous contrast. COMPARISON:  09/22/2017 FINDINGS: Brain: There is no evidence of acute infarct,  intracranial hemorrhage, mass, midline shift, or extra-axial fluid collection. There is mild cerebral atrophy. Patchy cerebral white matter hypodensities are unchanged and nonspecific but compatible with mild-to-moderate chronic small vessel ischemic disease.  Vascular: Extensive calcified atherosclerosis at the skull base. No hyperdense vessel. Skull: No fracture or focal osseous lesion. Sinuses/Orbits: Visualized paranasal sinuses and mastoid air cells are clear. Visualized orbits are unremarkable. Other: None. IMPRESSION: 1. No evidence of acute intracranial abnormality. 2. Mild-to-moderate chronic small vessel ischemic disease. Electronically Signed   By: Logan Bores M.D.   On: 10/03/2017 13:43   Ir Angiogram Visceral Selective  Result Date: 10/04/2017 CLINICAL DATA:  Upper GI bleed complicated by hypotension and requiring blood transfusion. Endoscopy demonstrates large duodenal ulcer with adherent blood clot. Adequate clipping for hemostasis could not be achieved endoscopically. Empiric embolization of the supplying gastroduodenal artery is requested. Renal insufficiency. EXAM: ADDITIONAL ARTERIOGRAPHY; IR ULTRASOUND GUIDANCE VASC ACCESS RIGHT; SELECTIVE VISCERAL ARTERIOGRAPHY; IR EMBO ART VEN HEMORR LYMPH EXTRAV INC GUIDE ROADMAPPING ANESTHESIA/SEDATION: Intravenous Fentanyl and Versed were administered as conscious sedation during continuous monitoring of the patient's level of consciousness and physiological / cardiorespiratory status by the radiology RN, with a total moderate sedation time of 26 minutes. MEDICATIONS: Lidocaine 1% subcutaneous CONTRAST:  15 mL Isovue IA PROCEDURE: The procedure, risks (including but not limited to bleeding, infection, organ damage ), benefits, and alternatives were explained to the patient. Questions regarding the procedure were encouraged and answered. The patient understands and consents to the procedure. Right femoral region prepped and draped in usual sterile fashion. Maximal barrier sterile technique was utilized including caps, mask, sterile gowns, sterile gloves, sterile drape, hand hygiene and skin antiseptic. The right common femoral artery was demonstrated to be patent under ultrasound, and documentation stored.  Under real-time ultrasound guidance, the vessel was accessed with a 21-gauge micropuncture needle, exchanged over a 018 guidewire for a transitional dilator, through which a 035 guidewire was advanced. Over this, a 5 [place] vascular sheath was placed, through which a 5 [place] C2 catheter was advanced and used to selectively catheterize the celiac axis. Using an angled Glidewire, the catheter was advanced to the distal common hepatic artery. A coaxial Renegade microcatheter with angled Transcend guidewire was coaxially advanced into the gastroduodenal artery. After confirmatory arteriography, the artery was embolized with 5 mm interlock coils, until cessation of flow was achieved. After confirmatory arteriogram, the microcatheter and diagnostic catheter if and sheath were removed and hemostasis achieved with the aid of the Exoseal device after confirmatory femoral arteriography. The patient tolerated the procedure well. COMPLICATIONS: None immediate FINDINGS: No evidence of active extravasation on selective celiac arteriography, common hepatic arteriography, and gastroduodenal arteriography. However, given the findings on recent endoscopy, we proceeded with empiric embolization of the gastroduodenal artery as planned. No other arterial abnormalities were identified. IMPRESSION: 1. Technically successful empiric coil embolization of the gastroduodenal artery to control bleeding duodenal ulcer. Electronically Signed   By: Lucrezia Europe M.D.   On: 10/04/2017 14:36   Ir Angiogram Selective Each Additional Vessel  Result Date: 10/04/2017 CLINICAL DATA:  Upper GI bleed complicated by hypotension and requiring blood transfusion. Endoscopy demonstrates large duodenal ulcer with adherent blood clot. Adequate clipping for hemostasis could not be achieved endoscopically. Empiric embolization of the supplying gastroduodenal artery is requested. Renal insufficiency. EXAM: ADDITIONAL ARTERIOGRAPHY; IR ULTRASOUND GUIDANCE VASC  ACCESS RIGHT; SELECTIVE VISCERAL ARTERIOGRAPHY; IR EMBO ART VEN HEMORR LYMPH EXTRAV INC GUIDE ROADMAPPING ANESTHESIA/SEDATION: Intravenous Fentanyl and Versed were administered as conscious sedation during continuous monitoring of  the patient's level of consciousness and physiological / cardiorespiratory status by the radiology RN, with a total moderate sedation time of 26 minutes. MEDICATIONS: Lidocaine 1% subcutaneous CONTRAST:  15 mL Isovue IA PROCEDURE: The procedure, risks (including but not limited to bleeding, infection, organ damage ), benefits, and alternatives were explained to the patient. Questions regarding the procedure were encouraged and answered. The patient understands and consents to the procedure. Right femoral region prepped and draped in usual sterile fashion. Maximal barrier sterile technique was utilized including caps, mask, sterile gowns, sterile gloves, sterile drape, hand hygiene and skin antiseptic. The right common femoral artery was demonstrated to be patent under ultrasound, and documentation stored. Under real-time ultrasound guidance, the vessel was accessed with a 21-gauge micropuncture needle, exchanged over a 018 guidewire for a transitional dilator, through which a 035 guidewire was advanced. Over this, a 5 [place] vascular sheath was placed, through which a 5 [place] C2 catheter was advanced and used to selectively catheterize the celiac axis. Using an angled Glidewire, the catheter was advanced to the distal common hepatic artery. A coaxial Renegade microcatheter with angled Transcend guidewire was coaxially advanced into the gastroduodenal artery. After confirmatory arteriography, the artery was embolized with 5 mm interlock coils, until cessation of flow was achieved. After confirmatory arteriogram, the microcatheter and diagnostic catheter if and sheath were removed and hemostasis achieved with the aid of the Exoseal device after confirmatory femoral arteriography. The  patient tolerated the procedure well. COMPLICATIONS: None immediate FINDINGS: No evidence of active extravasation on selective celiac arteriography, common hepatic arteriography, and gastroduodenal arteriography. However, given the findings on recent endoscopy, we proceeded with empiric embolization of the gastroduodenal artery as planned. No other arterial abnormalities were identified. IMPRESSION: 1. Technically successful empiric coil embolization of the gastroduodenal artery to control bleeding duodenal ulcer. Electronically Signed   By: Lucrezia Europe M.D.   On: 10/04/2017 14:36   Ir US Guide Vasc Access Right  Result Date: 10/04/2017 CLINICAL DATA:  Upper GI bleed complicated by hypotension and requiring blood transfusion. Endoscopy demonstrates large duodenal ulcer with adherent blood clot. Adequate clipping for hemostasis could not be achieved endoscopically. Empiric embolization of the supplying gastroduodenal artery is requested. Renal insufficiency. EXAM: ADDITIONAL ARTERIOGRAPHY; IR ULTRASOUND GUIDANCE VASC ACCESS RIGHT; SELECTIVE VISCERAL ARTERIOGRAPHY; IR EMBO ART VEN HEMORR LYMPH EXTRAV INC GUIDE ROADMAPPING ANESTHESIA/SEDATION: Intravenous Fentanyl and Versed were administered as conscious sedation during continuous monitoring of the patient's level of consciousness and physiological / cardiorespiratory status by the radiology RN, with a total moderate sedation time of 26 minutes. MEDICATIONS: Lidocaine 1% subcutaneous CONTRAST:  15 mL Isovue IA PROCEDURE: The procedure, risks (including but not limited to bleeding, infection, organ damage ), benefits, and alternatives were explained to the patient. Questions regarding the procedure were encouraged and answered. The patient understands and consents to the procedure. Right femoral region prepped and draped in usual sterile fashion. Maximal barrier sterile technique was utilized including caps, mask, sterile gowns, sterile gloves, sterile drape, hand  hygiene and skin antiseptic. The right common femoral artery was demonstrated to be patent under ultrasound, and documentation stored. Under real-time ultrasound guidance, the vessel was accessed with a 21-gauge micropuncture needle, exchanged over a 018 guidewire for a transitional dilator, through which a 035 guidewire was advanced. Over this, a 5 [place] vascular sheath was placed, through which a 5 [place] C2 catheter was advanced and used to selectively catheterize the celiac axis. Using an angled Glidewire, the catheter was advanced to the distal common hepatic  artery. A coaxial Renegade microcatheter with angled Transcend guidewire was coaxially advanced into the gastroduodenal artery. After confirmatory arteriography, the artery was embolized with 5 mm interlock coils, until cessation of flow was achieved. After confirmatory arteriogram, the microcatheter and diagnostic catheter if and sheath were removed and hemostasis achieved with the aid of the Exoseal device after confirmatory femoral arteriography. The patient tolerated the procedure well. COMPLICATIONS: None immediate FINDINGS: No evidence of active extravasation on selective celiac arteriography, common hepatic arteriography, and gastroduodenal arteriography. However, given the findings on recent endoscopy, we proceeded with empiric embolization of the gastroduodenal artery as planned. No other arterial abnormalities were identified. IMPRESSION: 1. Technically successful empiric coil embolization of the gastroduodenal artery to control bleeding duodenal ulcer. Electronically Signed   By: Lucrezia Europe M.D.   On: 10/04/2017 14:36   Ir Embo Art  [place] Guide Roadmapping  Result Date: 10/04/2017 CLINICAL DATA:  Upper GI bleed complicated by hypotension and requiring blood transfusion. Endoscopy demonstrates large duodenal ulcer with adherent blood clot. Adequate clipping for hemostasis could not be achieved endoscopically. Empiric  embolization of the supplying gastroduodenal artery is requested. Renal insufficiency. EXAM: ADDITIONAL ARTERIOGRAPHY; IR ULTRASOUND GUIDANCE VASC ACCESS RIGHT; SELECTIVE VISCERAL ARTERIOGRAPHY; IR EMBO ART VEN HEMORR LYMPH EXTRAV INC GUIDE ROADMAPPING ANESTHESIA/SEDATION: Intravenous Fentanyl and Versed were administered as conscious sedation during continuous monitoring of the patient's level of consciousness and physiological / cardiorespiratory status by the radiology RN, with a total moderate sedation time of 26 minutes. MEDICATIONS: Lidocaine 1% subcutaneous CONTRAST:  15 mL Isovue IA PROCEDURE: The procedure, risks (including but not limited to bleeding, infection, organ damage ), benefits, and alternatives were explained to the patient. Questions regarding the procedure were encouraged and answered. The patient understands and consents to the procedure. Right femoral region prepped and draped in usual sterile fashion. Maximal barrier sterile technique was utilized including caps, mask, sterile gowns, sterile gloves, sterile drape, hand hygiene and skin antiseptic. The right common femoral artery was demonstrated to be patent under ultrasound, and documentation stored. Under real-time ultrasound guidance, the vessel was accessed with a 21-gauge micropuncture needle, exchanged over a 018 guidewire for a transitional dilator, through which a 035 guidewire was advanced. Over this, a 5 [place] vascular sheath was placed, through which a 5 [place] C2 catheter was advanced and used to selectively catheterize the celiac axis. Using an angled Glidewire, the catheter was advanced to the distal common hepatic artery. A coaxial Renegade microcatheter with angled Transcend guidewire was coaxially advanced into the gastroduodenal artery. After confirmatory arteriography, the artery was embolized with 5 mm interlock coils, until cessation of flow was achieved. After confirmatory arteriogram, the microcatheter and diagnostic  catheter if and sheath were removed and hemostasis achieved with the aid of the Exoseal device after confirmatory femoral arteriography. The patient tolerated the procedure well. COMPLICATIONS: None immediate FINDINGS: No evidence of active extravasation on selective celiac arteriography, common hepatic arteriography, and gastroduodenal arteriography. However, given the findings on recent endoscopy, we proceeded with empiric embolization of the gastroduodenal artery as planned. No other arterial abnormalities were identified. IMPRESSION: 1. Technically successful empiric coil embolization of the gastroduodenal artery to control bleeding duodenal ulcer. Electronically Signed   By: Lucrezia Europe M.D.   On: 10/04/2017 14:36    Assessment and Plan:   1. Elevated troponin level: Patient has a history of known coronary artery disease.  He had a Myoview study in 2016 which revealed an old inferior lateral myocardial infarction.  I do not think that he is ever had a heart catheterization.  Because of his relatively frail state Dr. Tamala Julian has chosen medical therapy and not an interventional strategy.  History is somewhat difficult as I think he has some memory impairment but he does not appear to be in any acute distress.  He does not report having any chest pain.  He does comment that he has been weak for some time and this is probably related to his anemia.  Troponin level earlier today was 1.23.  We will continue to trend troponins but this point he is not a candidate for heart catheterization for multiple reasons. ( frail state, age, CKD with creatinine of 3.26)  Continue medical therapy/conservative therapy.  2.  Chronic combined systolic and diastolic congestive heart failure: Patient has a history of chronic combined systolic and diastolic congestive heart failure.  Ejection fraction  In 2014 was mildly reduced at 45-50% his most recent echo shows an ejection fraction of 25-30%. Continue with medical  therapy. Had an echocardiogram several weeks ago.  He does not need repeat echocardiogram at this time.  2.  GI bleed.  He had a endoscopy today which revealed a duodenal ulcer.  We will hold aspirin.  For questions or updates, please contact [place] Please consult [email or web address] for contact info under Cardiology/STEMI.   Signed, Mertie Moores, MD  10/04/2017 5:13 PM

## 2017-10-04 NOTE — Anesthesia Procedure Notes (Signed)
Procedure Name: MAC Date/Time: 10/04/2017 11:40 AM Performed by: Imagene Riches, CRNA Pre-anesthesia Checklist: Patient identified, Emergency Drugs available, Suction available and Patient being monitored Patient Re-evaluated:Patient Re-evaluated prior to induction Oxygen Delivery Method: Nasal cannula Preoxygenation: Pre-oxygenation with 100% oxygen Induction Type: IV induction

## 2017-10-05 ENCOUNTER — Encounter (HOSPITAL_COMMUNITY): Payer: Self-pay | Admitting: Gastroenterology

## 2017-10-05 DIAGNOSIS — I5022 Chronic systolic (congestive) heart failure: Secondary | ICD-10-CM

## 2017-10-05 DIAGNOSIS — I214 Non-ST elevation (NSTEMI) myocardial infarction: Secondary | ICD-10-CM | POA: Diagnosis present

## 2017-10-05 DIAGNOSIS — R748 Abnormal levels of other serum enzymes: Secondary | ICD-10-CM

## 2017-10-05 DIAGNOSIS — K920 Hematemesis: Secondary | ICD-10-CM

## 2017-10-05 LAB — BASIC METABOLIC PANEL
ANION GAP: 11 (ref 5–15)
BUN: 89 mg/dL — ABNORMAL HIGH (ref 6–20)
CO2: 15 mmol/L — AB (ref 22–32)
Calcium: 7.9 mg/dL — ABNORMAL LOW (ref 8.9–10.3)
Chloride: 118 mmol/L — ABNORMAL HIGH (ref 101–111)
Creatinine, Ser: 2.94 mg/dL — ABNORMAL HIGH (ref 0.61–1.24)
GFR calc non Af Amer: 18 mL/min — ABNORMAL LOW (ref >60)
GFR, EST AFRICAN AMERICAN: 21 mL/min — AB (ref >60)
Glucose, Bld: 121 mg/dL — ABNORMAL HIGH (ref 65–99)
Potassium: 5.6 mmol/L — ABNORMAL HIGH (ref 3.5–5.1)
SODIUM: 144 mmol/L (ref 135–145)

## 2017-10-05 LAB — CBC
HEMATOCRIT: 26.4 % — AB (ref 39.0–52.0)
HEMOGLOBIN: 8.6 g/dL — AB (ref 13.0–17.0)
MCH: 29.2 pg (ref 26.0–34.0)
MCHC: 32.6 g/dL (ref 30.0–36.0)
MCV: 89.5 fL (ref 78.0–100.0)
Platelets: 126 10*3/uL — ABNORMAL LOW (ref 150–400)
RBC: 2.95 MIL/uL — ABNORMAL LOW (ref 4.22–5.81)
RDW: 14.7 % (ref 11.5–15.5)
WBC: 15.6 10*3/uL — AB (ref 4.0–10.5)

## 2017-10-05 LAB — TROPONIN I
TROPONIN I: 4.97 ng/mL — AB (ref <0.03)
TROPONIN I: 11.05 ng/mL — AB (ref <0.03)
TROPONIN I: 0.99 ng/mL — AB (ref <0.03)
TROPONIN I: 1 ng/mL — AB (ref <0.03)

## 2017-10-05 LAB — POTASSIUM: POTASSIUM: 5.1 mmol/L (ref 3.5–5.1)

## 2017-10-05 MED ORDER — FUROSEMIDE 10 MG/ML IJ SOLN
20.0000 mg | Freq: Once | INTRAMUSCULAR | Status: AC
  Administered 2017-10-05: 20 mg via INTRAVENOUS
  Filled 2017-10-05: qty 2

## 2017-10-05 MED ORDER — SODIUM CHLORIDE 0.9 % IV SOLN
1.0000 g | Freq: Once | INTRAVENOUS | Status: AC
  Administered 2017-10-05: 1 g via INTRAVENOUS
  Filled 2017-10-05: qty 10

## 2017-10-05 MED ORDER — NITROGLYCERIN 0.4 MG SL SUBL
SUBLINGUAL_TABLET | SUBLINGUAL | Status: AC
  Administered 2017-10-05: 0.4 mg via SUBLINGUAL
  Filled 2017-10-05: qty 1

## 2017-10-05 MED ORDER — NITROGLYCERIN 0.4 MG SL SUBL: 0.4000 mg | SUBLINGUAL_TABLET | SUBLINGUAL | Status: DC | PRN

## 2017-10-05 MED ORDER — ISOSORBIDE MONONITRATE ER 60 MG PO TB24
60.0000 mg | ORAL_TABLET | Freq: Every day | ORAL | Status: DC
  Administered 2017-10-05 – 2017-10-10 (×6): 60 mg via ORAL
  Filled 2017-10-05 (×6): qty 1

## 2017-10-05 MED ORDER — MORPHINE SULFATE (PF) 2 MG/ML IV SOLN
2.0000 mg | INTRAVENOUS | Status: DC | PRN
  Administered 2017-10-05: 2 mg via INTRAVENOUS
  Filled 2017-10-05: qty 1

## 2017-10-05 MED ORDER — SODIUM CHLORIDE 0.9 % IV BOLUS (SEPSIS)
250.0000 mL | Freq: Once | INTRAVENOUS | Status: AC
  Administered 2017-10-05: 250 mL via INTRAVENOUS

## 2017-10-05 MED ORDER — SODIUM BICARBONATE 650 MG PO TABS
650.0000 mg | ORAL_TABLET | Freq: Three times a day (TID) | ORAL | Status: AC
  Administered 2017-10-05 (×3): 650 mg via ORAL
  Filled 2017-10-05 (×3): qty 1

## 2017-10-05 MED ORDER — GI COCKTAIL ~~LOC~~
30.0000 mL | Freq: Once | ORAL | Status: AC
  Administered 2017-10-05: 30 mL via ORAL
  Filled 2017-10-05: qty 30

## 2017-10-05 MED ORDER — CARVEDILOL 3.125 MG PO TABS
3.1250 mg | ORAL_TABLET | Freq: Two times a day (BID) | ORAL | Status: DC
  Administered 2017-10-05 – 2017-10-10 (×10): 3.125 mg via ORAL
  Filled 2017-10-05 (×10): qty 1

## 2017-10-05 MED ORDER — SODIUM POLYSTYRENE SULFONATE PO POWD
30.0000 g | Freq: Once | ORAL | Status: AC
  Administered 2017-10-05: 30 g via ORAL
  Filled 2017-10-05: qty 30

## 2017-10-05 NOTE — Progress Notes (Signed)
Physical Therapy Evaluation Patient Details Name: Bradley [name] MRN: [id number] DOB: [birth year] Today's Date: 10/05/2017   History of Present Illness  Bradley [name] is a 84 y/o male admitted on 10/03/17 due to weakness and GI bleeding. Patient with a PMH significnat for HTN, GERD, pacemaker, CKD stage IV, BPH, CHF with EF 30% and CAD.   Clinical Impression  Patient admitted with the above listed diagnosis. Patient in bed upon PT arrival, able to sit EOB with no physical assist but does require PT for line/tube management as well as heavy motivation to perform supine to sit. Once EOB requires B UE support to maintain upright posture and with multiple attempts to return supine. Limited mobility orders at this time, however, unable to transfer due to lethargy and fatigue limiting patient. Will plan to continue to follow acutely to maximize functional mobility prior to d/c.     Follow Up Recommendations Home health PT;Supervision/Assistance - 24 hour    Equipment Recommendations  None recommended by PT(owns DME)    Recommendations for Other Services OT consult     Precautions / Restrictions Precautions Precautions: Fall Restrictions Weight Bearing Restrictions: No      Mobility  Bed Mobility Overal bed mobility: Needs Assistance Bed Mobility: Supine to Sit;Sit to Supine     Supine to sit: Supervision Sit to supine: Supervision   General bed mobility comments: increased time and effort; PT managing lines and tubes  Transfers                 General transfer comment: deferred today as patient seemingly lethargic with multilpe attempts to lie back while sitting EOB  Ambulation/Gait                Stairs            Wheelchair Mobility    Modified Rankin (Stroke Patients Only)       Balance Overall balance assessment: Needs assistance Sitting-balance support: Bilateral upper extremity supported;Feet supported Sitting balance-Leahy Scale: Fair                                        Pertinent Vitals/Pain Pain Assessment: No/denies pain    Home Living Family/patient expects to be discharged to:: Private residence Living Arrangements: Spouse/significant other Available Help at Discharge: Family;Available 24 hours/day Type of Home: House Home Access: Stairs to enter Entrance Stairs-Rails: None Entrance Stairs-Number of Steps: 1 Home Layout: Two level Home Equipment: Walker - 2 wheels;Cane - single point      Prior Function Level of Independence: Needs assistance   Gait / Transfers Assistance Needed: use of SPC/RW for mobility           Hand Dominance        Extremity/Trunk Assessment   Upper Extremity Assessment Upper Extremity Assessment: Defer to OT evaluation    Lower Extremity Assessment Lower Extremity Assessment: Generalized weakness RLE Deficits / Details: has L TKA       Communication   Communication: No difficulties  Cognition Arousal/Alertness: Lethargic Behavior During Therapy: WFL for tasks assessed/performed Overall Cognitive Status: No family/caregiver present to determine baseline cognitive functioning                                 General Comments: repeats himself often; unaware of time of day - thinks it is late in the  night, however knows name and place      General Comments      Exercises     Assessment/Plan    PT Assessment Patient needs continued PT services  PT Problem List Decreased strength;Decreased activity tolerance;Decreased balance;Decreased mobility;Decreased knowledge of use of DME;Decreased safety awareness       PT Treatment Interventions DME instruction;Gait training;Functional mobility training;Therapeutic activities;Therapeutic exercise;Balance training;Patient/family education    PT Goals (Current goals can be found in the Care Plan section)  Acute Rehab PT Goals Patient Stated Goal: return home PT Goal Formulation: With  patient Time For Goal Achievement: 10/19/17 Potential to Achieve Goals: Good    Frequency Min 3X/week   Barriers to discharge        Co-evaluation               AM-PAC PT "6 Clicks" Daily Activity  Outcome Measure Difficulty turning over in bed (including adjusting bedclothes, sheets and blankets)?: A Little Difficulty moving from lying on back to sitting on the side of the bed? : A Little Difficulty sitting down on and standing up from a chair with arms (e.g., wheelchair, bedside commode, etc,.)?: Unable Help needed moving to and from a bed to chair (including a wheelchair)?: A Lot Help needed walking in hospital room?: A Lot Help needed climbing 3-5 steps with a railing? : A Lot 6 Click Score: 13    End of Session   Activity Tolerance: Patient limited by fatigue;Patient limited by lethargy Patient left: in bed;with call bell/phone within reach;with bed alarm set;with nursing/sitter in room Nurse Communication: Mobility status PT Visit Diagnosis: Unsteadiness on feet (R26.81);Muscle weakness (generalized) (M62.81);Difficulty in walking, not elsewhere classified (R26.2);Other abnormalities of gait and mobility (R26.89)    Time: 1031-5945 PT Time Calculation (min) (ACUTE ONLY): 23 min   Charges:   PT Evaluation $PT Eval Moderate Complexity: 1 Mod     PT G Codes:       Lanney Gins, PT, DPT 10/05/17 11:48 AM

## 2017-10-05 NOTE — Progress Notes (Signed)
OT Cancellation Note  Patient Details Name: ROSSI BURDO MRN: [id number] DOB: [birth year]   Cancelled Treatment:    Reason Eval/Treat Not Completed: Patient declined, no reason specified. Pt sleeping soundly, recent rapid response. OT will continue to follow for eval as schedule allows  Jaci Carrel 10/05/2017, 3:35 PM  Hulda Humphrey OTR/L [phone]

## 2017-10-05 NOTE — Progress Notes (Signed)
Dr Mariel Kansky consult note reviewed. Trop has peaked and trending down. Per Dr Elmarie Shiley note patient deemed not to be a cath candidate, recs to continue medical therapy. Restart ASA, beta blocker, hydral/nitrates when able from GI bleed standpoint. Renal function prohibits ACE/ARB/ARNI. Ongoing GI bleed with ongoing transfusion requirement, management per primary team. We will sign off inpatient.    Carlyle Dolly MD

## 2017-10-05 NOTE — Progress Notes (Signed)
CCMD notified RN that pt had 4 beat run of vtach MD notified pt resting comfortably VSS BP 102/67 (BP Location: Right Arm)   Pulse 77   Temp 98.3 F (36.8 C) (Oral)   Resp 17   Ht 6\' 1"  (1.854 m)   Wt 83.5 kg (184 lb 1.4 oz)   SpO2 99%   BMI 24.29 kg/m

## 2017-10-05 NOTE — Progress Notes (Signed)
Deny A Sessa 11:54 AM  Subjective: Patient answering some questions appropriately fairly lethargic no signs of GI bleeding no complaints of pain hospital computer chart reviewed and case discussed with my partner Dr. Raliegh Ip  Objective: Vital signs stable afebrile exam is pertinent for his abdomen being soft nontender occasional bowel sounds hemoglobin fairly stable BUN and creatinine decreased  Assessment: Duodenal ulcer with bleeding status post injection endoscopically with epinephrine and IR  therapy  Plan: Okay for clear liquids today and if no signs of further bleeding may advance tomorrow and care with aspirin and nonsteroidals going forward and continue pump inhibitors long-term  [facility] E  Pager [phone] After 5PM or if no answer call [phone]

## 2017-10-05 NOTE — Significant Event (Signed)
Rapid Response Event Note  Overview:  Called to assist with patient with CP.Marland Kitchen Instructed to get 12 lead EKG.  Time Called: 1405 Arrival Time: 1412 Event Type: Cardiac  Initial Focused Assessment:  Warm and dry - arouses to name - little sleepy but follows commands.  Skin warm and dry.  Resps regular - endorses little SOB - no nausea. Bil BS = clear.  Abd soft - no complaints. C/0 midsternal CP - "doesn't feel too good."  Rates it a 8/10.  He believes he has had this pain before.  Monitor shows SR with increasing PVC's - multifocal - some intermittent pacing noted.  12 lead EKG concerning for new changes in inferior leads.   Interventions:  Erin RN in room - has spoken with Dr. Lonny Prude - 2 mg Morphine IV given per order MD - also GI cocktail - patient denies that Morphine has helped - of note did have first po's - liquid tray at lunchtime.  Patinent with documented heart disease - has been on long acting nitrates at home.  BP 106/42 HR 98.  Dr. Lonny Prude updated per [facility].  0.4 mg SL nitroglycerin tablet given. GI cocktail given.  Dr. Lonny Prude to bedside.  Patient now stating the pain is lots better - is much more relaxed and comfortable.    Bp 116/68 HR 81 with decrease in PVC.  Handoff to [employer]    Plan of Care (if not transferred):  To call as needed.    Event Summary: Name of Physician Notified: Dr. Lonny Prude at (pta RRT)    at    Outcome: Stayed in room and stabalized  Event End Time: [place]  Quin Hoop

## 2017-10-05 NOTE — Progress Notes (Signed)
PROGRESS NOTE    Bradley [name]  VQQ:[id number] DOB: 18-Sep-1927 DOA: 10/03/2017 PCP: [name], MD   Brief Narrative: Bradley [name] is a 84 y.o. [name] with a history of hypertension, GERD, pacemaker placement, CKD stage IV, BPH, CHF with EF 30%, CAD. He presented with weakness and GI bleeding. Patient with frequent hematochezia. GI consulted for evaluation and management. Troponin elevated with EKG changes. Cardiology consulted and recommending medical management (not a candidate for cath)   Assessment & Plan:   Principal Problem:   GIB (gastrointestinal bleeding) Active Problems:   GERD (gastroesophageal reflux disease)   Osteoarthritis of both knees   Chronic kidney disease, stage IV (severe) (HCC)   Chronic combined systolic (congestive) and diastolic (congestive) heart failure (HCC)   CAD in native artery   Symptomatic bradycardia   Benign prostatic hyperplasia   Status post placement of cardiac pacemaker   Abdominal pain   Symptomatic anemia   Protein-calorie malnutrition, moderate (HCC)   GI bleed Recurrent bleeding overnight requiring another unit of blood. S/p GDA coil embolization. No recurrent bleeding so far. GI recommendations -PPI -Transfuse for symptoms or hemoglobin <8  Acute blood loss anemia Secondary to GI bleeding from ulcer. Stable hemoglobin. -Transfusions as above  Frequent falls Likely secondary to blood loss. CT negative. Recently diagnosed with UTI. Completed course.  Elevated troponin Trending down. EKG showed more pronounced lateral lead t-wave inversions. Asymptomatic. Not a cath candidate. Cardiology recommending continued medical management and resumption of home medications when stable from a GI standpoint.  GERD Stable -Continue protonix  Acute on chronic CKD 4 Secondary to hypovolemia from acute blood loss. Improved. -recheck BMP -Fluid resuscitation   CAD Stable. No chest pain. Troponin elevated. No chest  pain. -Aspirin on hold secondary to significant GI bleeding  Essential hypertension Coreg, lasix, hydralazine, Imdur on hold secondary to GI bleeding and hypotension  Hyperkalemia Continues to be elevated today. Trending down. In setting of AKI. Asymptomatic. -BMP in AM  Chronic combined systolic and diastolic heart failure EF of 25-30% with diffuse hypokinesis and inferior akinesis  Leukocytosis Stable. No evidence of infection. Afebrile. -CBC   DVT prophylaxis: SCDs Code Status:   Code Status: Full Code Family Communication: None at bedside Disposition Plan: Discharge home pending GI workup/management   Consultants:   Gastroenterology  Procedures:   None  Antimicrobials:  None    Subjective: Some abdominal pain in epigastrium.  Objective: Vitals:   10/05/17 0431 10/05/17 0432 10/05/17 0433 10/05/17 0806  BP: 137/81   120/80  Pulse:  90    Resp: (!) 23   16  Temp:  97.7 F (36.5 C)  98.2 F (36.8 C)  TempSrc:  Oral  Axillary  SpO2:  99%  100%  Weight:   83.5 kg (184 lb 1.4 oz)   Height:        Intake/Output Summary (Last 24 hours) at 10/05/2017 1046 Last data filed at 10/05/2017 0431 Gross per 24 hour  Intake 2264.25 ml  Output 360 ml  Net 1904.25 ml   Filed Weights   10/04/17 0639 10/04/17 1045 10/05/17 0433  Weight: 82.4 kg (181 lb 10.5 oz) 82.4 kg (181 lb 10.5 oz) 83.5 kg (184 lb 1.4 oz)    Examination:  General exam: Appears calm and comfortable HEENT: strabismus Respiratory system: Respiratory effort normal. Gastrointestinal system: Abdomen is nondistended, soft and tender in epigastrium. No organomegaly or masses felt. Central nervous system: Alert and oriented to person, place and year. Extremities: No edema. No calf  tenderness Skin: No cyanosis. No rashes Psychiatry: Judgement and insight appear normal. Flat affect    Data Reviewed: I have personally reviewed following labs and imaging studies  CBC: Recent Labs  Lab  10/03/17 1243 10/03/17 2329 10/04/17 0533 10/04/17 1932 10/05/17 0825  WBC 11.4* 12.4* 12.7* 16.8* 15.6*  NEUTROABS 9.5*  --   --   --   --   HGB 7.2* 9.1* 7.4* 9.4* 8.6*  HCT 22.9* 27.5* 22.5* 28.6* 26.4*  MCV 89.8 89.9 90.4 88.8 89.5  PLT 203 147* 145* 130* 195*   Basic Metabolic Panel: Recent Labs  Lab 10/03/17 1243 10/04/17 0533 10/04/17 1932 10/05/17 0825  NA 138 141  --  144  K 5.3* 5.9* 5.8* 5.6*  CL 110 116*  --  118*  CO2 18* 15*  --  15*  GLUCOSE 130* 128*  --  121*  BUN 107* 108*  --  89*  CREATININE 3.62* 3.26*  --  2.94*  CALCIUM 7.9* 7.6*  --  7.9*   GFR: Estimated Creatinine Clearance: 20 mL/min (A) (by C-G formula based on SCr of 2.94 mg/dL (H)). Liver Function Tests: Recent Labs  Lab 10/03/17 1243 10/04/17 0533  AST 18 24  ALT 17 15*  ALKPHOS 43 33*  BILITOT 0.6 1.2  PROT 5.2* 4.5*  ALBUMIN 2.9* 2.4*   No results for input(s): LIPASE, AMYLASE in the last 168 hours. No results for input(s): AMMONIA in the last 168 hours. Coagulation Profile: Recent Labs  Lab 10/03/17 1243  INR 1.20   Cardiac Enzymes: Recent Labs  Lab 10/03/17 2329 10/04/17 0533 10/04/17 1932 10/04/17 2335 10/05/17 0521  TROPONINI 0.26* 1.23* 1.12* 1.00* 0.99*   BNP (last 3 results) No results for input(s): PROBNP in the last 8760 hours. HbA1C: No results for input(s): HGBA1C in the last 72 hours. CBG: No results for input(s): GLUCAP in the last 168 hours. Lipid Profile: No results for input(s): CHOL, HDL, LDLCALC, TRIG, CHOLHDL, LDLDIRECT in the last 72 hours. Thyroid Function Tests: Recent Labs    10/03/17 1429  TSH 2.621   Anemia Panel: Recent Labs    10/03/17 1429  VITAMINB12 342  FOLATE 8.4  FERRITIN 93  TIBC 227*  IRON 63  RETICCTPCT 2.2   Sepsis Labs: Recent Labs  Lab 10/03/17 1257  LATICACIDVEN 1.62    Recent Results (from the past 240 hour(s))  MRSA PCR Screening     Status: Abnormal   Collection Time: 10/03/17  4:58 PM  Result  Value Ref Range Status   MRSA by PCR POSITIVE (A) NEGATIVE Final    Comment:        The GeneXpert MRSA Assay (FDA approved for NASAL specimens only), is one component of a comprehensive MRSA colonization surveillance program. It is not intended to diagnose MRSA infection nor to guide or monitor treatment for MRSA infections. RESULT CALLED TO, READ BACK BY AND VERIFIED WITH: O.OLFIN,RN AT 2152 BY L.PITT 10/03/17          Radiology Studies: Ct Head Wo Contrast  Result Date: 10/03/2017 CLINICAL DATA:  Fall today.  Recent weakness.  Hypotensive. EXAM: CT HEAD WITHOUT CONTRAST TECHNIQUE: Contiguous axial images were obtained from the base of the skull through the vertex without intravenous contrast. COMPARISON:  09/22/2017 FINDINGS: Brain: There is no evidence of acute infarct, intracranial hemorrhage, mass, midline shift, or extra-axial fluid collection. There is mild cerebral atrophy. Patchy cerebral white matter hypodensities are unchanged and nonspecific but compatible with mild-to-moderate chronic small vessel ischemic disease.  Vascular: Extensive calcified atherosclerosis at the skull base. No hyperdense vessel. Skull: No fracture or focal osseous lesion. Sinuses/Orbits: Visualized paranasal sinuses and mastoid air cells are clear. Visualized orbits are unremarkable. Other: None. IMPRESSION: 1. No evidence of acute intracranial abnormality. 2. Mild-to-moderate chronic small vessel ischemic disease. Electronically Signed   By: Logan Bores M.D.   On: 10/03/2017 13:43   Ir Angiogram Visceral Selective  Result Date: 10/04/2017 CLINICAL DATA:  Upper GI bleed complicated by hypotension and requiring blood transfusion. Endoscopy demonstrates large duodenal ulcer with adherent blood clot. Adequate clipping for hemostasis could not be achieved endoscopically. Empiric embolization of the supplying gastroduodenal artery is requested. Renal insufficiency. EXAM: ADDITIONAL ARTERIOGRAPHY; IR ULTRASOUND  GUIDANCE VASC ACCESS RIGHT; SELECTIVE VISCERAL ARTERIOGRAPHY; IR EMBO ART VEN HEMORR LYMPH EXTRAV INC GUIDE ROADMAPPING ANESTHESIA/SEDATION: Intravenous Fentanyl and Versed were administered as conscious sedation during continuous monitoring of the patient's level of consciousness and physiological / cardiorespiratory status by the radiology RN, with a total moderate sedation time of 26 minutes. MEDICATIONS: Lidocaine 1% subcutaneous CONTRAST:  15 mL Isovue IA PROCEDURE: The procedure, risks (including but not limited to bleeding, infection, organ damage ), benefits, and alternatives were explained to the patient. Questions regarding the procedure were encouraged and answered. The patient understands and consents to the procedure. Right femoral region prepped and draped in usual sterile fashion. Maximal barrier sterile technique was utilized including caps, mask, sterile gowns, sterile gloves, sterile drape, hand hygiene and skin antiseptic. The right common femoral artery was demonstrated to be patent under ultrasound, and documentation stored. Under real-time ultrasound guidance, the vessel was accessed with a 21-gauge micropuncture needle, exchanged over a 018 guidewire for a transitional dilator, through which a 035 guidewire was advanced. Over this, a 5 [place] vascular sheath was placed, through which a 5 [place] C2 catheter was advanced and used to selectively catheterize the celiac axis. Using an angled Glidewire, the catheter was advanced to the distal common hepatic artery. A coaxial Renegade microcatheter with angled Transcend guidewire was coaxially advanced into the gastroduodenal artery. After confirmatory arteriography, the artery was embolized with 5 mm interlock coils, until cessation of flow was achieved. After confirmatory arteriogram, the microcatheter and diagnostic catheter if and sheath were removed and hemostasis achieved with the aid of the Exoseal device after confirmatory femoral  arteriography. The patient tolerated the procedure well. COMPLICATIONS: None immediate FINDINGS: No evidence of active extravasation on selective celiac arteriography, common hepatic arteriography, and gastroduodenal arteriography. However, given the findings on recent endoscopy, we proceeded with empiric embolization of the gastroduodenal artery as planned. No other arterial abnormalities were identified. IMPRESSION: 1. Technically successful empiric coil embolization of the gastroduodenal artery to control bleeding duodenal ulcer. Electronically Signed   By: Lucrezia Europe M.D.   On: 10/04/2017 14:36   Ir Angiogram Selective Each Additional Vessel  Result Date: 10/04/2017 CLINICAL DATA:  Upper GI bleed complicated by hypotension and requiring blood transfusion. Endoscopy demonstrates large duodenal ulcer with adherent blood clot. Adequate clipping for hemostasis could not be achieved endoscopically. Empiric embolization of the supplying gastroduodenal artery is requested. Renal insufficiency. EXAM: ADDITIONAL ARTERIOGRAPHY; IR ULTRASOUND GUIDANCE VASC ACCESS RIGHT; SELECTIVE VISCERAL ARTERIOGRAPHY; IR EMBO ART VEN HEMORR LYMPH EXTRAV INC GUIDE ROADMAPPING ANESTHESIA/SEDATION: Intravenous Fentanyl and Versed were administered as conscious sedation during continuous monitoring of the patient's level of consciousness and physiological / cardiorespiratory status by the radiology RN, with a total moderate sedation time of 26 minutes. MEDICATIONS: Lidocaine 1% subcutaneous CONTRAST:  15 mL Isovue IA  PROCEDURE: The procedure, risks (including but not limited to bleeding, infection, organ damage ), benefits, and alternatives were explained to the patient. Questions regarding the procedure were encouraged and answered. The patient understands and consents to the procedure. Right femoral region prepped and draped in usual sterile fashion. Maximal barrier sterile technique was utilized including caps, mask, sterile gowns,  sterile gloves, sterile drape, hand hygiene and skin antiseptic. The right common femoral artery was demonstrated to be patent under ultrasound, and documentation stored. Under real-time ultrasound guidance, the vessel was accessed with a 21-gauge micropuncture needle, exchanged over a 018 guidewire for a transitional dilator, through which a 035 guidewire was advanced. Over this, a 5 [place] vascular sheath was placed, through which a 5 [place] C2 catheter was advanced and used to selectively catheterize the celiac axis. Using an angled Glidewire, the catheter was advanced to the distal common hepatic artery. A coaxial Renegade microcatheter with angled Transcend guidewire was coaxially advanced into the gastroduodenal artery. After confirmatory arteriography, the artery was embolized with 5 mm interlock coils, until cessation of flow was achieved. After confirmatory arteriogram, the microcatheter and diagnostic catheter if and sheath were removed and hemostasis achieved with the aid of the Exoseal device after confirmatory femoral arteriography. The patient tolerated the procedure well. COMPLICATIONS: None immediate FINDINGS: No evidence of active extravasation on selective celiac arteriography, common hepatic arteriography, and gastroduodenal arteriography. However, given the findings on recent endoscopy, we proceeded with empiric embolization of the gastroduodenal artery as planned. No other arterial abnormalities were identified. IMPRESSION: 1. Technically successful empiric coil embolization of the gastroduodenal artery to control bleeding duodenal ulcer. Electronically Signed   By: Lucrezia Europe M.D.   On: 10/04/2017 14:36   Ir US Guide Vasc Access Right  Result Date: 10/04/2017 CLINICAL DATA:  Upper GI bleed complicated by hypotension and requiring blood transfusion. Endoscopy demonstrates large duodenal ulcer with adherent blood clot. Adequate clipping for hemostasis could not be achieved endoscopically.  Empiric embolization of the supplying gastroduodenal artery is requested. Renal insufficiency. EXAM: ADDITIONAL ARTERIOGRAPHY; IR ULTRASOUND GUIDANCE VASC ACCESS RIGHT; SELECTIVE VISCERAL ARTERIOGRAPHY; IR EMBO ART VEN HEMORR LYMPH EXTRAV INC GUIDE ROADMAPPING ANESTHESIA/SEDATION: Intravenous Fentanyl and Versed were administered as conscious sedation during continuous monitoring of the patient's level of consciousness and physiological / cardiorespiratory status by the radiology RN, with a total moderate sedation time of 26 minutes. MEDICATIONS: Lidocaine 1% subcutaneous CONTRAST:  15 mL Isovue IA PROCEDURE: The procedure, risks (including but not limited to bleeding, infection, organ damage ), benefits, and alternatives were explained to the patient. Questions regarding the procedure were encouraged and answered. The patient understands and consents to the procedure. Right femoral region prepped and draped in usual sterile fashion. Maximal barrier sterile technique was utilized including caps, mask, sterile gowns, sterile gloves, sterile drape, hand hygiene and skin antiseptic. The right common femoral artery was demonstrated to be patent under ultrasound, and documentation stored. Under real-time ultrasound guidance, the vessel was accessed with a 21-gauge micropuncture needle, exchanged over a 018 guidewire for a transitional dilator, through which a 035 guidewire was advanced. Over this, a 5 [place] vascular sheath was placed, through which a 5 [place] C2 catheter was advanced and used to selectively catheterize the celiac axis. Using an angled Glidewire, the catheter was advanced to the distal common hepatic artery. A coaxial Renegade microcatheter with angled Transcend guidewire was coaxially advanced into the gastroduodenal artery. After confirmatory arteriography, the artery was embolized with 5 mm interlock coils, until cessation of flow was  achieved. After confirmatory arteriogram, the microcatheter and  diagnostic catheter if and sheath were removed and hemostasis achieved with the aid of the Exoseal device after confirmatory femoral arteriography. The patient tolerated the procedure well. COMPLICATIONS: None immediate FINDINGS: No evidence of active extravasation on selective celiac arteriography, common hepatic arteriography, and gastroduodenal arteriography. However, given the findings on recent endoscopy, we proceeded with empiric embolization of the gastroduodenal artery as planned. No other arterial abnormalities were identified. IMPRESSION: 1. Technically successful empiric coil embolization of the gastroduodenal artery to control bleeding duodenal ulcer. Electronically Signed   By: Lucrezia Europe M.D.   On: 10/04/2017 14:36   Ir Embo Art  [employer] Guide Roadmapping  Result Date: 10/04/2017 CLINICAL DATA:  Upper GI bleed complicated by hypotension and requiring blood transfusion. Endoscopy demonstrates large duodenal ulcer with adherent blood clot. Adequate clipping for hemostasis could not be achieved endoscopically. Empiric embolization of the supplying gastroduodenal artery is requested. Renal insufficiency. EXAM: ADDITIONAL ARTERIOGRAPHY; IR ULTRASOUND GUIDANCE VASC ACCESS RIGHT; SELECTIVE VISCERAL ARTERIOGRAPHY; IR EMBO ART VEN HEMORR LYMPH EXTRAV INC GUIDE ROADMAPPING ANESTHESIA/SEDATION: Intravenous Fentanyl and Versed were administered as conscious sedation during continuous monitoring of the patient's level of consciousness and physiological / cardiorespiratory status by the radiology RN, with a total moderate sedation time of 26 minutes. MEDICATIONS: Lidocaine 1% subcutaneous CONTRAST:  15 mL Isovue IA PROCEDURE: The procedure, risks (including but not limited to bleeding, infection, organ damage ), benefits, and alternatives were explained to the patient. Questions regarding the procedure were encouraged and answered. The patient understands and consents to the procedure. Right  femoral region prepped and draped in usual sterile fashion. Maximal barrier sterile technique was utilized including caps, mask, sterile gowns, sterile gloves, sterile drape, hand hygiene and skin antiseptic. The right common femoral artery was demonstrated to be patent under ultrasound, and documentation stored. Under real-time ultrasound guidance, the vessel was accessed with a 21-gauge micropuncture needle, exchanged over a 018 guidewire for a transitional dilator, through which a 035 guidewire was advanced. Over this, a 5 [place] vascular sheath was placed, through which a 5 [place] C2 catheter was advanced and used to selectively catheterize the celiac axis. Using an angled Glidewire, the catheter was advanced to the distal common hepatic artery. A coaxial Renegade microcatheter with angled Transcend guidewire was coaxially advanced into the gastroduodenal artery. After confirmatory arteriography, the artery was embolized with 5 mm interlock coils, until cessation of flow was achieved. After confirmatory arteriogram, the microcatheter and diagnostic catheter if and sheath were removed and hemostasis achieved with the aid of the Exoseal device after confirmatory femoral arteriography. The patient tolerated the procedure well. COMPLICATIONS: None immediate FINDINGS: No evidence of active extravasation on selective celiac arteriography, common hepatic arteriography, and gastroduodenal arteriography. However, given the findings on recent endoscopy, we proceeded with empiric embolization of the gastroduodenal artery as planned. No other arterial abnormalities were identified. IMPRESSION: 1. Technically successful empiric coil embolization of the gastroduodenal artery to control bleeding duodenal ulcer. Electronically Signed   By: Lucrezia Europe M.D.   On: 10/04/2017 14:36        Scheduled Meds: . Chlorhexidine Gluconate Cloth  6 each Topical Q0600  . cholecalciferol  1,000 Units Oral BID  . dorzolamide  1 drop  Both Eyes BID   And  . timolol  1 drop Both Eyes BID  . latanoprost  1 drop Both Eyes QHS  . mupirocin ointment  1 application Nasal BID  . [START ON 10/07/2017]  pantoprazole  40 mg Intravenous Q12H  . sodium bicarbonate  650 mg Oral TID   Continuous Infusions: . sodium chloride 75 mL/hr at 10/05/17 0209  . pantoprozole (PROTONIX) infusion 8 mg/hr (10/05/17 0030)     LOS: 2 days     Cordelia Poche, MD [facility] 10/05/2017, 10:46 AM Pager: [phone]  If 7PM-7AM, please contact night-coverage [email or web address] Password [email or web address] 10/05/2017, 10:46 AM

## 2017-10-05 NOTE — Progress Notes (Signed)
Progress Note  Patient Name: Bradley [name] Date of Encounter: 10/05/2017  Primary Cardiologist:   No primary care provider on file.   Subjective   Currently he is comfortable and he has no chest pain.    Inpatient Medications    Scheduled Meds: . Chlorhexidine Gluconate Cloth  6 each Topical Q0600  . cholecalciferol  1,000 Units Oral BID  . dorzolamide  1 drop Both Eyes BID   And  . timolol  1 drop Both Eyes BID  . isosorbide mononitrate  60 mg Oral Daily  . latanoprost  1 drop Both Eyes QHS  . mupirocin ointment  1 application Nasal BID  . [START ON 10/07/2017] pantoprazole  40 mg Intravenous Q12H  . sodium bicarbonate  650 mg Oral TID   Continuous Infusions: . sodium chloride 75 mL/hr at 10/05/17 0209  . pantoprozole (PROTONIX) infusion 8 mg/hr (10/05/17 1118)   PRN Meds: acetaminophen **OR** acetaminophen, bisacodyl, HYDROcodone-acetaminophen, morphine injection, nitroGLYCERIN, ondansetron **OR** ondansetron (ZOFRAN) IV, senna-docusate   Vital Signs    Vitals:   10/05/17 1442 10/05/17 1445 10/05/17 1552 10/05/17 1624  BP: 114/66 109/72 102/67 102/67  Pulse: 88 85 81 77  Resp: 17 (!) 22 16 17   Temp:   97.7 F (36.5 C) 98.3 F (36.8 C)  TempSrc:    Oral  SpO2: 97% 100% 98% 99%  Weight:      Height:        Intake/Output Summary (Last 24 hours) at 10/05/2017 1701 Last data filed at 10/05/2017 1625 Gross per 24 hour  Intake 1476.25 ml  Output 660 ml  Net 816.25 ml   Filed Weights   10/04/17 0639 10/04/17 1045 10/05/17 0433  Weight: 181 lb 10.5 oz (82.4 kg) 181 lb 10.5 oz (82.4 kg) 184 lb 1.4 oz (83.5 kg)    Telemetry    NSR - Personally Reviewed  ECG    NSR, rate 95, axis WNL intervals with prolonged QRS (IVCD), anterior and lateral ST depression and T wave inversion slightly more pronounced than previous.  - Personally Reviewed  Physical Exam   GEN: No acute distress.   Neck: No  JVD Cardiac: RRR, no murmurs, rubs, or gallops.  Respiratory:  Clear  to auscultation bilaterally. GI: Soft, nontender, non-distended  MS: No edema; No deformity. Neuro:  Nonfocal  Psych: Normal affect   Labs    Chemistry Recent Labs  Lab 10/03/17 1243 10/04/17 0533 10/04/17 1932 10/05/17 0825  NA 138 141  --  144  K 5.3* 5.9* 5.8* 5.6*  CL 110 116*  --  118*  CO2 18* 15*  --  15*  GLUCOSE 130* 128*  --  121*  BUN 107* 108*  --  89*  CREATININE 3.62* 3.26*  --  2.94*  CALCIUM 7.9* 7.6*  --  7.9*  PROT 5.2* 4.5*  --   --   ALBUMIN 2.9* 2.4*  --   --   AST 18 24  --   --   ALT 17 15*  --   --   ALKPHOS 43 33*  --   --   BILITOT 0.6 1.2  --   --   GFRNONAA 14* 16*  --  18*  GFRAA 16* 18*  --  21*  ANIONGAP 10 10  --  11     Hematology Recent Labs  Lab 10/04/17 0533 10/04/17 1932 10/05/17 0825  WBC 12.7* 16.8* 15.6*  RBC 2.49* 3.22* 2.95*  HGB 7.4* 9.4* 8.6*  HCT 22.5*  28.6* 26.4*  MCV 90.4 88.8 89.5  MCH 29.7 29.2 29.2  MCHC 32.9 32.9 32.6  RDW 13.6 14.1 14.7  PLT 145* 130* 126*    Cardiac Enzymes Recent Labs  Lab 10/04/17 1932 10/04/17 2335 10/05/17 0521 10/05/17 1455  TROPONINI 1.12* 1.00* 0.99* 4.97*    Recent Labs  Lab 10/03/17 1254  TROPIPOC 0.03     BNPNo results for input(s): BNP, PROBNP in the last 168 hours.   DDimer No results for input(s): DDIMER in the last 168 hours.   Radiology    Ir Angiogram Visceral Selective  Result Date: 10/04/2017 CLINICAL DATA:  Upper GI bleed complicated by hypotension and requiring blood transfusion. Endoscopy demonstrates large duodenal ulcer with adherent blood clot. Adequate clipping for hemostasis could not be achieved endoscopically. Empiric embolization of the supplying gastroduodenal artery is requested. Renal insufficiency. EXAM: ADDITIONAL ARTERIOGRAPHY; IR ULTRASOUND GUIDANCE VASC ACCESS RIGHT; SELECTIVE VISCERAL ARTERIOGRAPHY; IR EMBO ART VEN HEMORR LYMPH EXTRAV INC GUIDE ROADMAPPING ANESTHESIA/SEDATION: Intravenous Fentanyl and Versed were administered as  conscious sedation during continuous monitoring of the patient's level of consciousness and physiological / cardiorespiratory status by the radiology RN, with a total moderate sedation time of 26 minutes. MEDICATIONS: Lidocaine 1% subcutaneous CONTRAST:  15 mL Isovue IA PROCEDURE: The procedure, risks (including but not limited to bleeding, infection, organ damage ), benefits, and alternatives were explained to the patient. Questions regarding the procedure were encouraged and answered. The patient understands and consents to the procedure. Right femoral region prepped and draped in usual sterile fashion. Maximal barrier sterile technique was utilized including caps, mask, sterile gowns, sterile gloves, sterile drape, hand hygiene and skin antiseptic. The right common femoral artery was demonstrated to be patent under ultrasound, and documentation stored. Under real-time ultrasound guidance, the vessel was accessed with a 21-gauge micropuncture needle, exchanged over a 018 guidewire for a transitional dilator, through which a 035 guidewire was advanced. Over this, a 5 [place] vascular sheath was placed, through which a 5 [place] C2 catheter was advanced and used to selectively catheterize the celiac axis. Using an angled Glidewire, the catheter was advanced to the distal common hepatic artery. A coaxial Renegade microcatheter with angled Transcend guidewire was coaxially advanced into the gastroduodenal artery. After confirmatory arteriography, the artery was embolized with 5 mm interlock coils, until cessation of flow was achieved. After confirmatory arteriogram, the microcatheter and diagnostic catheter if and sheath were removed and hemostasis achieved with the aid of the Exoseal device after confirmatory femoral arteriography. The patient tolerated the procedure well. COMPLICATIONS: None immediate FINDINGS: No evidence of active extravasation on selective celiac arteriography, common hepatic arteriography, and  gastroduodenal arteriography. However, given the findings on recent endoscopy, we proceeded with empiric embolization of the gastroduodenal artery as planned. No other arterial abnormalities were identified. IMPRESSION: 1. Technically successful empiric coil embolization of the gastroduodenal artery to control bleeding duodenal ulcer. Electronically Signed   By: Lucrezia Europe M.D.   On: 10/04/2017 14:36   Ir Angiogram Selective Each Additional Vessel  Result Date: 10/04/2017 CLINICAL DATA:  Upper GI bleed complicated by hypotension and requiring blood transfusion. Endoscopy demonstrates large duodenal ulcer with adherent blood clot. Adequate clipping for hemostasis could not be achieved endoscopically. Empiric embolization of the supplying gastroduodenal artery is requested. Renal insufficiency. EXAM: ADDITIONAL ARTERIOGRAPHY; IR ULTRASOUND GUIDANCE VASC ACCESS RIGHT; SELECTIVE VISCERAL ARTERIOGRAPHY; IR EMBO ART VEN HEMORR LYMPH EXTRAV INC GUIDE ROADMAPPING ANESTHESIA/SEDATION: Intravenous Fentanyl and Versed were administered as conscious sedation during continuous monitoring of the patient's  level of consciousness and physiological / cardiorespiratory status by the radiology RN, with a total moderate sedation time of 26 minutes. MEDICATIONS: Lidocaine 1% subcutaneous CONTRAST:  15 mL Isovue IA PROCEDURE: The procedure, risks (including but not limited to bleeding, infection, organ damage ), benefits, and alternatives were explained to the patient. Questions regarding the procedure were encouraged and answered. The patient understands and consents to the procedure. Right femoral region prepped and draped in usual sterile fashion. Maximal barrier sterile technique was utilized including caps, mask, sterile gowns, sterile gloves, sterile drape, hand hygiene and skin antiseptic. The right common femoral artery was demonstrated to be patent under ultrasound, and documentation stored. Under real-time ultrasound  guidance, the vessel was accessed with a 21-gauge micropuncture needle, exchanged over a 018 guidewire for a transitional dilator, through which a 035 guidewire was advanced. Over this, a 5 [place] vascular sheath was placed, through which a 5 [place] C2 catheter was advanced and used to selectively catheterize the celiac axis. Using an angled Glidewire, the catheter was advanced to the distal common hepatic artery. A coaxial Renegade microcatheter with angled Transcend guidewire was coaxially advanced into the gastroduodenal artery. After confirmatory arteriography, the artery was embolized with 5 mm interlock coils, until cessation of flow was achieved. After confirmatory arteriogram, the microcatheter and diagnostic catheter if and sheath were removed and hemostasis achieved with the aid of the Exoseal device after confirmatory femoral arteriography. The patient tolerated the procedure well. COMPLICATIONS: None immediate FINDINGS: No evidence of active extravasation on selective celiac arteriography, common hepatic arteriography, and gastroduodenal arteriography. However, given the findings on recent endoscopy, we proceeded with empiric embolization of the gastroduodenal artery as planned. No other arterial abnormalities were identified. IMPRESSION: 1. Technically successful empiric coil embolization of the gastroduodenal artery to control bleeding duodenal ulcer. Electronically Signed   By: Lucrezia Europe M.D.   On: 10/04/2017 14:36   Ir US Guide Vasc Access Right  Result Date: 10/04/2017 CLINICAL DATA:  Upper GI bleed complicated by hypotension and requiring blood transfusion. Endoscopy demonstrates large duodenal ulcer with adherent blood clot. Adequate clipping for hemostasis could not be achieved endoscopically. Empiric embolization of the supplying gastroduodenal artery is requested. Renal insufficiency. EXAM: ADDITIONAL ARTERIOGRAPHY; IR ULTRASOUND GUIDANCE VASC ACCESS RIGHT; SELECTIVE VISCERAL ARTERIOGRAPHY;  IR EMBO ART VEN HEMORR LYMPH EXTRAV INC GUIDE ROADMAPPING ANESTHESIA/SEDATION: Intravenous Fentanyl and Versed were administered as conscious sedation during continuous monitoring of the patient's level of consciousness and physiological / cardiorespiratory status by the radiology RN, with a total moderate sedation time of 26 minutes. MEDICATIONS: Lidocaine 1% subcutaneous CONTRAST:  15 mL Isovue IA PROCEDURE: The procedure, risks (including but not limited to bleeding, infection, organ damage ), benefits, and alternatives were explained to the patient. Questions regarding the procedure were encouraged and answered. The patient understands and consents to the procedure. Right femoral region prepped and draped in usual sterile fashion. Maximal barrier sterile technique was utilized including caps, mask, sterile gowns, sterile gloves, sterile drape, hand hygiene and skin antiseptic. The right common femoral artery was demonstrated to be patent under ultrasound, and documentation stored. Under real-time ultrasound guidance, the vessel was accessed with a 21-gauge micropuncture needle, exchanged over a 018 guidewire for a transitional dilator, through which a 035 guidewire was advanced. Over this, a 5 [place] vascular sheath was placed, through which a 5 [place] C2 catheter was advanced and used to selectively catheterize the celiac axis. Using an angled Glidewire, the catheter was advanced to the distal common hepatic artery. A  coaxial Renegade microcatheter with angled Transcend guidewire was coaxially advanced into the gastroduodenal artery. After confirmatory arteriography, the artery was embolized with 5 mm interlock coils, until cessation of flow was achieved. After confirmatory arteriogram, the microcatheter and diagnostic catheter if and sheath were removed and hemostasis achieved with the aid of the Exoseal device after confirmatory femoral arteriography. The patient tolerated the procedure well. COMPLICATIONS:  None immediate FINDINGS: No evidence of active extravasation on selective celiac arteriography, common hepatic arteriography, and gastroduodenal arteriography. However, given the findings on recent endoscopy, we proceeded with empiric embolization of the gastroduodenal artery as planned. No other arterial abnormalities were identified. IMPRESSION: 1. Technically successful empiric coil embolization of the gastroduodenal artery to control bleeding duodenal ulcer. Electronically Signed   By: Lucrezia Europe M.D.   On: 10/04/2017 14:36   Ir Embo Art  [place] Guide Roadmapping  Result Date: 10/04/2017 CLINICAL DATA:  Upper GI bleed complicated by hypotension and requiring blood transfusion. Endoscopy demonstrates large duodenal ulcer with adherent blood clot. Adequate clipping for hemostasis could not be achieved endoscopically. Empiric embolization of the supplying gastroduodenal artery is requested. Renal insufficiency. EXAM: ADDITIONAL ARTERIOGRAPHY; IR ULTRASOUND GUIDANCE VASC ACCESS RIGHT; SELECTIVE VISCERAL ARTERIOGRAPHY; IR EMBO ART VEN HEMORR LYMPH EXTRAV INC GUIDE ROADMAPPING ANESTHESIA/SEDATION: Intravenous Fentanyl and Versed were administered as conscious sedation during continuous monitoring of the patient's level of consciousness and physiological / cardiorespiratory status by the radiology RN, with a total moderate sedation time of 26 minutes. MEDICATIONS: Lidocaine 1% subcutaneous CONTRAST:  15 mL Isovue IA PROCEDURE: The procedure, risks (including but not limited to bleeding, infection, organ damage ), benefits, and alternatives were explained to the patient. Questions regarding the procedure were encouraged and answered. The patient understands and consents to the procedure. Right femoral region prepped and draped in usual sterile fashion. Maximal barrier sterile technique was utilized including caps, mask, sterile gowns, sterile gloves, sterile drape, hand hygiene and skin  antiseptic. The right common femoral artery was demonstrated to be patent under ultrasound, and documentation stored. Under real-time ultrasound guidance, the vessel was accessed with a 21-gauge micropuncture needle, exchanged over a 018 guidewire for a transitional dilator, through which a 035 guidewire was advanced. Over this, a 5 [place] vascular sheath was placed, through which a 5 [place] C2 catheter was advanced and used to selectively catheterize the celiac axis. Using an angled Glidewire, the catheter was advanced to the distal common hepatic artery. A coaxial Renegade microcatheter with angled Transcend guidewire was coaxially advanced into the gastroduodenal artery. After confirmatory arteriography, the artery was embolized with 5 mm interlock coils, until cessation of flow was achieved. After confirmatory arteriogram, the microcatheter and diagnostic catheter if and sheath were removed and hemostasis achieved with the aid of the Exoseal device after confirmatory femoral arteriography. The patient tolerated the procedure well. COMPLICATIONS: None immediate FINDINGS: No evidence of active extravasation on selective celiac arteriography, common hepatic arteriography, and gastroduodenal arteriography. However, given the findings on recent endoscopy, we proceeded with empiric embolization of the gastroduodenal artery as planned. No other arterial abnormalities were identified. IMPRESSION: 1. Technically successful empiric coil embolization of the gastroduodenal artery to control bleeding duodenal ulcer. Electronically Signed   By: Lucrezia Europe M.D.   On: 10/04/2017 14:36    Cardiac Studies   NA  Patient Profile     84 y.o. male with a hx of heart failure, coronary artery disease, chronic kidney disease, hypertension and pacemaker who is was seen for the evaluation  of syncope and elevated troponin level at the request of Dr. Lonny Prude .  Assessment & Plan    ELEVATED TROPONIN:  No invasive evaluation.   Medical management only.   Troponin did go back up.  However, given comorbid conditions including GI bleed he is not an interventional candidate.  Agree with restarting Imdur.  Will try to restart a low dose of beta blocker.  If he has continued pain can use IV NTG.  Low threshold for transfusion of Hgb is falling.      CHRONIC SYSTOLIC AND DIASTOLIC HF:   EF 81%.   Seems to be euvolemic.  No change in therapy.    GI BLEED:  Not a candidate for ASA with ulcer confirmed and acute bleeding.    For questions or updates, please contact [place] Please consult [email or web address] for contact info under Cardiology/STEMI.   Signed, Minus Breeding, MD  10/05/2017, 5:01 PM

## 2017-10-06 DIAGNOSIS — I214 Non-ST elevation (NSTEMI) myocardial infarction: Secondary | ICD-10-CM

## 2017-10-06 LAB — CBC
HCT: 23.3 % — ABNORMAL LOW (ref 39.0–52.0)
HEMOGLOBIN: 7.8 g/dL — AB (ref 13.0–17.0)
MCH: 30.1 pg (ref 26.0–34.0)
MCHC: 33.5 g/dL (ref 30.0–36.0)
MCV: 90 fL (ref 78.0–100.0)
PLATELETS: 134 10*3/uL — AB (ref 150–400)
RBC: 2.59 MIL/uL — AB (ref 4.22–5.81)
RDW: 15.2 % (ref 11.5–15.5)
WBC: 11.1 10*3/uL — AB (ref 4.0–10.5)

## 2017-10-06 LAB — HEMOGLOBIN AND HEMATOCRIT, BLOOD
HCT: 20.9 % — ABNORMAL LOW (ref 39.0–52.0)
Hemoglobin: 6.8 g/dL — CL (ref 13.0–17.0)

## 2017-10-06 LAB — PREPARE RBC (CROSSMATCH)

## 2017-10-06 LAB — TROPONIN I
TROPONIN I: 21.76 ng/mL — AB (ref <0.03)
Troponin I: 20.54 ng/mL (ref <0.03)

## 2017-10-06 LAB — BASIC METABOLIC PANEL
ANION GAP: 6 (ref 5–15)
BUN: 77 mg/dL — ABNORMAL HIGH (ref 6–20)
CO2: 17 mmol/L — AB (ref 22–32)
Calcium: 7.7 mg/dL — ABNORMAL LOW (ref 8.9–10.3)
Chloride: 119 mmol/L — ABNORMAL HIGH (ref 101–111)
Creatinine, Ser: 2.71 mg/dL — ABNORMAL HIGH (ref 0.61–1.24)
GFR calc Af Amer: 23 mL/min — ABNORMAL LOW (ref >60)
GFR, EST NON AFRICAN AMERICAN: 20 mL/min — AB (ref >60)
GLUCOSE: 104 mg/dL — AB (ref 65–99)
POTASSIUM: 4.5 mmol/L (ref 3.5–5.1)
Sodium: 142 mmol/L (ref 135–145)

## 2017-10-06 MED ORDER — SODIUM CHLORIDE 0.9 % IV SOLN
Freq: Once | INTRAVENOUS | Status: AC
  Administered 2017-10-06: 17:00:00 via INTRAVENOUS

## 2017-10-06 MED ORDER — FUROSEMIDE 10 MG/ML IJ SOLN
20.0000 mg | Freq: Once | INTRAMUSCULAR | Status: AC
Start: 2017-10-06 — End: 2017-10-06
  Administered 2017-10-06: 20 mg via INTRAVENOUS
  Filled 2017-10-06: qty 2

## 2017-10-06 MED ORDER — ASPIRIN EC 81 MG PO TBEC
81.0000 mg | DELAYED_RELEASE_TABLET | Freq: Every day | ORAL | Status: DC
  Administered 2017-10-06: 81 mg via ORAL
  Filled 2017-10-06: qty 1

## 2017-10-06 NOTE — Progress Notes (Signed)
OT Cancellation Note  Patient Details Name: Bradley [name] MRN: [id number] DOB: 05-08-1928   Cancelled Treatment:    Reason Eval/Treat Not Completed: Patient not medically ready;Medical issues which prohibited therapy. Pt with significantly elevated troponin level; RN asked to hold therapy this AM. Will re-attempt OT evaluation when pt is medically appropriate.  Redmond Baseman, MS, OTR/L 10/06/2017, 10:27 AM

## 2017-10-06 NOTE — Progress Notes (Signed)
Bradley [name] 10:35 AM  Subjective: Patient without any GI complaints and no signs of bleeding and tolerating clear liquids  Objective: Vital signs stable afebrile more alert today than yesterday abdomen is soft nontender occasional bowel sounds slight decrease hemoglobin as well as slightly decreased BUN and creatinine  Assessment: Duodenal ulcer with bleed  Plan: Clear liquids today and if doing well tomorrow without signs of bleeding may slowly advance diet tomorrow no aspirin or nonsteroidals long-term may change to oral pump inhibitors tomorrow  [facility]  Pager [phone] After 5PM or if no answer call [phone]

## 2017-10-06 NOTE — Progress Notes (Signed)
Progress Note  Patient Name: Bradley [name] Date of Encounter: 10/06/2017  Primary Cardiologist:   Sinclair Grooms, MD   Subjective   He denies any chest pain or SOB.   Inpatient Medications    Scheduled Meds: . aspirin EC  81 mg Oral Daily  . carvedilol  3.125 mg Oral BID WC  . Chlorhexidine Gluconate Cloth  6 each Topical Q0600  . cholecalciferol  1,000 Units Oral BID  . dorzolamide  1 drop Both Eyes BID   And  . timolol  1 drop Both Eyes BID  . isosorbide mononitrate  60 mg Oral Daily  . latanoprost  1 drop Both Eyes QHS  . mupirocin ointment  1 application Nasal BID  . [START ON 10/07/2017] pantoprazole  40 mg Intravenous Q12H   Continuous Infusions: . pantoprozole (PROTONIX) infusion 8 mg/hr (10/06/17 0940)   PRN Meds: acetaminophen **OR** acetaminophen, bisacodyl, HYDROcodone-acetaminophen, morphine injection, nitroGLYCERIN, ondansetron **OR** ondansetron (ZOFRAN) IV, senna-docusate   Vital Signs    Vitals:   10/06/17 0417 10/06/17 0500 10/06/17 0530 10/06/17 0824  BP:   116/65 (!) 107/57  Pulse:   76 83  Resp:   15 19  Temp: 98.1 F (36.7 C)   98.1 F (36.7 C)  TempSrc:    Oral  SpO2:   100% 98%  Weight:  185 lb 3 oz (84 kg)    Height:        Intake/Output Summary (Last 24 hours) at 10/06/2017 1121 Last data filed at 10/06/2017 0817 Gross per 24 hour  Intake 110 ml  Output 1300 ml  Net -1190 ml   Filed Weights   10/04/17 1045 10/05/17 0433 10/06/17 0500  Weight: 181 lb 10.5 oz (82.4 kg) 184 lb 1.4 oz (83.5 kg) 185 lb 3 oz (84 kg)    Telemetry    NSR - Personally Reviewed  ECG    NA  Physical Exam   GEN: No  acute distress.   Neck: No  JVD Cardiac: RRR, no murmurs, rubs, or gallops.  Respiratory: Clear   to auscultation bilaterally. GI: Soft, nontender, non-distended, normal bowel sounds  MS:  No edema; No deformity. Neuro:   Nonfocal  Psych: Oriented and appropriate    Labs    Chemistry Recent Labs  Lab 10/03/17 1243  10/04/17 0533  10/05/17 0825 10/05/17 1455 10/06/17 0308  NA 138 141  --  144  --  142  K 5.3* 5.9*   < > 5.6* 5.1 4.5  CL 110 116*  --  118*  --  119*  CO2 18* 15*  --  15*  --  17*  GLUCOSE 130* 128*  --  121*  --  104*  BUN 107* 108*  --  89*  --  77*  CREATININE 3.62* 3.26*  --  2.94*  --  2.71*  CALCIUM 7.9* 7.6*  --  7.9*  --  7.7*  PROT 5.2* 4.5*  --   --   --   --   ALBUMIN 2.9* 2.4*  --   --   --   --   AST 18 24  --   --   --   --   ALT 17 15*  --   --   --   --   ALKPHOS 43 33*  --   --   --   --   BILITOT 0.6 1.2  --   --   --   --   GFRNONAA 14*  16*  --  18*  --  20*  GFRAA 16* 18*  --  21*  --  23*  ANIONGAP 10 10  --  11  --  6   < > = values in this interval not displayed.     Hematology Recent Labs  Lab 10/04/17 1932 10/05/17 0825 10/06/17 0308  WBC 16.8* 15.6* 11.1*  RBC 3.22* 2.95* 2.59*  HGB 9.4* 8.6* 7.8*  HCT 28.6* 26.4* 23.3*  MCV 88.8 89.5 90.0  MCH 29.2 29.2 30.1  MCHC 32.9 32.6 33.5  RDW 14.1 14.7 15.2  PLT 130* 126* 134*    Cardiac Enzymes Recent Labs  Lab 10/05/17 1455 10/05/17 2016 10/06/17 0308 10/06/17 0915  TROPONINI 4.97* 11.05* 20.54* 21.76*    Recent Labs  Lab 10/03/17 1254  TROPIPOC 0.03     BNPNo results for input(s): BNP, PROBNP in the last 168 hours.   DDimer No results for input(s): DDIMER in the last 168 hours.   Radiology    Ir Angiogram Visceral Selective  Result Date: 10/04/2017 CLINICAL DATA:  Upper GI bleed complicated by hypotension and requiring blood transfusion. Endoscopy demonstrates large duodenal ulcer with adherent blood clot. Adequate clipping for hemostasis could not be achieved endoscopically. Empiric embolization of the supplying gastroduodenal artery is requested. Renal insufficiency. EXAM: ADDITIONAL ARTERIOGRAPHY; IR ULTRASOUND GUIDANCE VASC ACCESS RIGHT; SELECTIVE VISCERAL ARTERIOGRAPHY; IR EMBO ART VEN HEMORR LYMPH EXTRAV INC GUIDE ROADMAPPING ANESTHESIA/SEDATION: Intravenous Fentanyl and  Versed were administered as conscious sedation during continuous monitoring of the patient's level of consciousness and physiological / cardiorespiratory status by the radiology RN, with a total moderate sedation time of 26 minutes. MEDICATIONS: Lidocaine 1% subcutaneous CONTRAST:  15 mL Isovue IA PROCEDURE: The procedure, risks (including but not limited to bleeding, infection, organ damage ), benefits, and alternatives were explained to the patient. Questions regarding the procedure were encouraged and answered. The patient understands and consents to the procedure. Right femoral region prepped and draped in usual sterile fashion. Maximal barrier sterile technique was utilized including caps, mask, sterile gowns, sterile gloves, sterile drape, hand hygiene and skin antiseptic. The right common femoral artery was demonstrated to be patent under ultrasound, and documentation stored. Under real-time ultrasound guidance, the vessel was accessed with a 21-gauge micropuncture needle, exchanged over a 018 guidewire for a transitional dilator, through which a 035 guidewire was advanced. Over this, a 5 [place] vascular sheath was placed, through which a 5 [place] C2 catheter was advanced and used to selectively catheterize the celiac axis. Using an angled Glidewire, the catheter was advanced to the distal common hepatic artery. A coaxial Renegade microcatheter with angled Transcend guidewire was coaxially advanced into the gastroduodenal artery. After confirmatory arteriography, the artery was embolized with 5 mm interlock coils, until cessation of flow was achieved. After confirmatory arteriogram, the microcatheter and diagnostic catheter if and sheath were removed and hemostasis achieved with the aid of the Exoseal device after confirmatory femoral arteriography. The patient tolerated the procedure well. COMPLICATIONS: None immediate FINDINGS: No evidence of active extravasation on selective celiac arteriography, common  hepatic arteriography, and gastroduodenal arteriography. However, given the findings on recent endoscopy, we proceeded with empiric embolization of the gastroduodenal artery as planned. No other arterial abnormalities were identified. IMPRESSION: 1. Technically successful empiric coil embolization of the gastroduodenal artery to control bleeding duodenal ulcer. Electronically Signed   By: Lucrezia Europe M.D.   On: 10/04/2017 14:36   Ir Angiogram Selective Each Additional Vessel  Result Date: 10/04/2017 CLINICAL DATA:  Upper GI bleed complicated by hypotension and requiring blood transfusion. Endoscopy demonstrates large duodenal ulcer with adherent blood clot. Adequate clipping for hemostasis could not be achieved endoscopically. Empiric embolization of the supplying gastroduodenal artery is requested. Renal insufficiency. EXAM: ADDITIONAL ARTERIOGRAPHY; IR ULTRASOUND GUIDANCE VASC ACCESS RIGHT; SELECTIVE VISCERAL ARTERIOGRAPHY; IR EMBO ART VEN HEMORR LYMPH EXTRAV INC GUIDE ROADMAPPING ANESTHESIA/SEDATION: Intravenous Fentanyl and Versed were administered as conscious sedation during continuous monitoring of the patient's level of consciousness and physiological / cardiorespiratory status by the radiology RN, with a total moderate sedation time of 26 minutes. MEDICATIONS: Lidocaine 1% subcutaneous CONTRAST:  15 mL Isovue IA PROCEDURE: The procedure, risks (including but not limited to bleeding, infection, organ damage ), benefits, and alternatives were explained to the patient. Questions regarding the procedure were encouraged and answered. The patient understands and consents to the procedure. Right femoral region prepped and draped in usual sterile fashion. Maximal barrier sterile technique was utilized including caps, mask, sterile gowns, sterile gloves, sterile drape, hand hygiene and skin antiseptic. The right common femoral artery was demonstrated to be patent under ultrasound, and documentation stored. Under  real-time ultrasound guidance, the vessel was accessed with a 21-gauge micropuncture needle, exchanged over a 018 guidewire for a transitional dilator, through which a 035 guidewire was advanced. Over this, a 5 [place] vascular sheath was placed, through which a 5 [place] C2 catheter was advanced and used to selectively catheterize the celiac axis. Using an angled Glidewire, the catheter was advanced to the distal common hepatic artery. A coaxial Renegade microcatheter with angled Transcend guidewire was coaxially advanced into the gastroduodenal artery. After confirmatory arteriography, the artery was embolized with 5 mm interlock coils, until cessation of flow was achieved. After confirmatory arteriogram, the microcatheter and diagnostic catheter if and sheath were removed and hemostasis achieved with the aid of the Exoseal device after confirmatory femoral arteriography. The patient tolerated the procedure well. COMPLICATIONS: None immediate FINDINGS: No evidence of active extravasation on selective celiac arteriography, common hepatic arteriography, and gastroduodenal arteriography. However, given the findings on recent endoscopy, we proceeded with empiric embolization of the gastroduodenal artery as planned. No other arterial abnormalities were identified. IMPRESSION: 1. Technically successful empiric coil embolization of the gastroduodenal artery to control bleeding duodenal ulcer. Electronically Signed   By: Lucrezia Europe M.D.   On: 10/04/2017 14:36   Ir US Guide Vasc Access Right  Result Date: 10/04/2017 CLINICAL DATA:  Upper GI bleed complicated by hypotension and requiring blood transfusion. Endoscopy demonstrates large duodenal ulcer with adherent blood clot. Adequate clipping for hemostasis could not be achieved endoscopically. Empiric embolization of the supplying gastroduodenal artery is requested. Renal insufficiency. EXAM: ADDITIONAL ARTERIOGRAPHY; IR ULTRASOUND GUIDANCE VASC ACCESS RIGHT; SELECTIVE  VISCERAL ARTERIOGRAPHY; IR EMBO ART VEN HEMORR LYMPH EXTRAV INC GUIDE ROADMAPPING ANESTHESIA/SEDATION: Intravenous Fentanyl and Versed were administered as conscious sedation during continuous monitoring of the patient's level of consciousness and physiological / cardiorespiratory status by the radiology RN, with a total moderate sedation time of 26 minutes. MEDICATIONS: Lidocaine 1% subcutaneous CONTRAST:  15 mL Isovue IA PROCEDURE: The procedure, risks (including but not limited to bleeding, infection, organ damage ), benefits, and alternatives were explained to the patient. Questions regarding the procedure were encouraged and answered. The patient understands and consents to the procedure. Right femoral region prepped and draped in usual sterile fashion. Maximal barrier sterile technique was utilized including caps, mask, sterile gowns, sterile gloves, sterile drape, hand hygiene and skin antiseptic. The right common femoral artery was demonstrated to  be patent under ultrasound, and documentation stored. Under real-time ultrasound guidance, the vessel was accessed with a 21-gauge micropuncture needle, exchanged over a 018 guidewire for a transitional dilator, through which a 035 guidewire was advanced. Over this, a 5 [place] vascular sheath was placed, through which a 5 [place] C2 catheter was advanced and used to selectively catheterize the celiac axis. Using an angled Glidewire, the catheter was advanced to the distal common hepatic artery. A coaxial Renegade microcatheter with angled Transcend guidewire was coaxially advanced into the gastroduodenal artery. After confirmatory arteriography, the artery was embolized with 5 mm interlock coils, until cessation of flow was achieved. After confirmatory arteriogram, the microcatheter and diagnostic catheter if and sheath were removed and hemostasis achieved with the aid of the Exoseal device after confirmatory femoral arteriography. The patient tolerated the  procedure well. COMPLICATIONS: None immediate FINDINGS: No evidence of active extravasation on selective celiac arteriography, common hepatic arteriography, and gastroduodenal arteriography. However, given the findings on recent endoscopy, we proceeded with empiric embolization of the gastroduodenal artery as planned. No other arterial abnormalities were identified. IMPRESSION: 1. Technically successful empiric coil embolization of the gastroduodenal artery to control bleeding duodenal ulcer. Electronically Signed   By: Lucrezia Europe M.D.   On: 10/04/2017 14:36   Ir Embo Art  [place] Guide Roadmapping  Result Date: 10/04/2017 CLINICAL DATA:  Upper GI bleed complicated by hypotension and requiring blood transfusion. Endoscopy demonstrates large duodenal ulcer with adherent blood clot. Adequate clipping for hemostasis could not be achieved endoscopically. Empiric embolization of the supplying gastroduodenal artery is requested. Renal insufficiency. EXAM: ADDITIONAL ARTERIOGRAPHY; IR ULTRASOUND GUIDANCE VASC ACCESS RIGHT; SELECTIVE VISCERAL ARTERIOGRAPHY; IR EMBO ART VEN HEMORR LYMPH EXTRAV INC GUIDE ROADMAPPING ANESTHESIA/SEDATION: Intravenous Fentanyl and Versed were administered as conscious sedation during continuous monitoring of the patient's level of consciousness and physiological / cardiorespiratory status by the radiology RN, with a total moderate sedation time of 26 minutes. MEDICATIONS: Lidocaine 1% subcutaneous CONTRAST:  15 mL Isovue IA PROCEDURE: The procedure, risks (including but not limited to bleeding, infection, organ damage ), benefits, and alternatives were explained to the patient. Questions regarding the procedure were encouraged and answered. The patient understands and consents to the procedure. Right femoral region prepped and draped in usual sterile fashion. Maximal barrier sterile technique was utilized including caps, mask, sterile gowns, sterile gloves, sterile drape,  hand hygiene and skin antiseptic. The right common femoral artery was demonstrated to be patent under ultrasound, and documentation stored. Under real-time ultrasound guidance, the vessel was accessed with a 21-gauge micropuncture needle, exchanged over a 018 guidewire for a transitional dilator, through which a 035 guidewire was advanced. Over this, a 5 [place] vascular sheath was placed, through which a 5 [place] C2 catheter was advanced and used to selectively catheterize the celiac axis. Using an angled Glidewire, the catheter was advanced to the distal common hepatic artery. A coaxial Renegade microcatheter with angled Transcend guidewire was coaxially advanced into the gastroduodenal artery. After confirmatory arteriography, the artery was embolized with 5 mm interlock coils, until cessation of flow was achieved. After confirmatory arteriogram, the microcatheter and diagnostic catheter if and sheath were removed and hemostasis achieved with the aid of the Exoseal device after confirmatory femoral arteriography. The patient tolerated the procedure well. COMPLICATIONS: None immediate FINDINGS: No evidence of active extravasation on selective celiac arteriography, common hepatic arteriography, and gastroduodenal arteriography. However, given the findings on recent endoscopy, we proceeded with empiric embolization of the gastroduodenal artery as  planned. No other arterial abnormalities were identified. IMPRESSION: 1. Technically successful empiric coil embolization of the gastroduodenal artery to control bleeding duodenal ulcer. Electronically Signed   By: Lucrezia Europe M.D.   On: 10/04/2017 14:36    Cardiac Studies   NA  Patient Profile     84 y.o. male with a hx of heart failure, coronary artery disease, chronic kidney disease, hypertension and pacemaker who is was seen for the evaluation of syncope and elevated troponin level at the request of Dr. Lonny Prude .  Assessment & Plan    NSTEMI :  No invasive  evaluation.  Medical management only.  Restarted on ASA.  Now on Imdur and Coreg.   BP will not allow med titration.  No change in therapy.  Note, note a candidate for Plavix.   CHRONIC SYSTOLIC AND DIASTOLIC HF:   EF 53%.   Seems to be euvolemic.  No change in therapy.    GI BLEED:  Seems to have been managed with coiling although Hgb is lower.  I would suggest that the primary team have a low threshold for transfusion given the NSTEMI.    For questions or updates, please contact [facility] Please consult [email or web address] for contact info under Cardiology/STEMI.   Signed, Minus Breeding, MD  10/06/2017, 11:21 AM

## 2017-10-06 NOTE — Progress Notes (Signed)
PROGRESS NOTE    Bradley [name]  ZCH:[id number] DOB: [birth year] DOA: 10/03/2017 PCP: [name], MD   Brief Narrative: Bradley [name] is a 84 y.o. male with a history of hypertension, GERD, pacemaker placement, CKD stage IV, BPH, CHF with EF 30%, CAD. He presented with weakness and GI bleeding. Patient with frequent hematochezia. GI consulted for evaluation and management. Troponin elevated with EKG changes. Cardiology consulted and recommending medical management (not a candidate for cath)   Assessment & Plan:   Principal Problem:   GIB (gastrointestinal bleeding) Active Problems:   GERD (gastroesophageal reflux disease)   Osteoarthritis of both knees   Chronic kidney disease, stage IV (severe) (HCC)   Chronic combined systolic (congestive) and diastolic (congestive) heart failure (HCC)   CAD in native artery   Symptomatic bradycardia   Benign prostatic hyperplasia   Status post placement of cardiac pacemaker   Abdominal pain   Symptomatic anemia   Protein-calorie malnutrition, moderate (HCC)   NSTEMI (non-ST elevated myocardial infarction) ([place])   GI bleed Recurrent bleeding overnight requiring another unit of blood. S/p GDA coil embolization. No recurrent bleeding so far. GI recommendations -PPI -Transfuse for symptoms or hemoglobin <8  Acute blood loss anemia Secondary to GI bleeding from ulcer. Stable hemoglobin. -Transfusions as above  Frequent falls Likely secondary to blood loss. CT negative. Recently diagnosed with UTI. Completed course.  NSTEMI Episode of severe chest pain on 3/9 with significant elevation of troponin overnight. Restarted Imdur. Cardiology reconsulted on 3/9 and is continuing medical management. Cardiology started Coreg. -Cardiology recommendations -Trend troponin  GERD Stable -Continue protonix  Acute on chronic CKD 4 Secondary to hypovolemia from acute blood loss. Continues to improve.  CAD Stable. Chest pain episode on 3/9.  Troponin increasing. Aspirin held initially secondary to GI bleeding. -Restart aspirin (OK per GI in setting of recent GI bleeding)  Essential hypertension Coreg, lasix, hydralazine, Imdur on hold secondary to GI bleeding and hypotension  Hyperkalemia Resolved today. Given calcium gluconate yesterday. Also given lasix/small IV fluid bolus and Kayexalate.   Chronic combined systolic and diastolic heart failure EF of 25-30% with diffuse hypokinesis and inferior akinesis. Euvolemic.  Leukocytosis No evidence of infection. Afebrile. Trending down.  Metabolic acidosis In setting of recent AKI. Improving.   DVT prophylaxis: SCDs Code Status:   Code Status: Full Code Family Communication: None at bedside Disposition Plan: Discharge home pending GI/Cardiology workup/management   Consultants:   Gastroenterology  Procedures:   None  Antimicrobials:  None    Subjective: No chest pain this morning  Objective: Vitals:   10/06/17 0417 10/06/17 0500 10/06/17 0530 10/06/17 0824  BP:   116/65 (!) 107/57  Pulse:   76 83  Resp:   15 19  Temp: 98.1 F (36.7 C)   98.1 F (36.7 C)  TempSrc:    Oral  SpO2:   100% 98%  Weight:  84 kg (185 lb 3 oz)    Height:        Intake/Output Summary (Last 24 hours) at 10/06/2017 0929 Last data filed at 10/06/2017 0817 Gross per 24 hour  Intake 110 ml  Output 1300 ml  Net -1190 ml   Filed Weights   10/04/17 1045 10/05/17 0433 10/06/17 0500  Weight: 82.4 kg (181 lb 10.5 oz) 83.5 kg (184 lb 1.4 oz) 84 kg (185 lb 3 oz)    Examination:  General exam: Appears calm and comfortable HEENT: strabismus Respiratory: Clear to auscultation bilaterally. Unlabored work of breathing. No wheezing or rales.  Gastrointestinal system: Abdomen is nondistended, soft and tender in epigastrium. No organomegaly or masses felt. Central nervous system: Alert and oriented to person, place. Extremities: No edema. No calf tenderness Skin: No cyanosis. No  rashes Psychiatry: Judgement and insight appear normal. Flat affect    Data Reviewed: I have personally reviewed following labs and imaging studies  CBC: Recent Labs  Lab 10/03/17 1243 10/03/17 2329 10/04/17 0533 10/04/17 1932 10/05/17 0825 10/06/17 0308  WBC 11.4* 12.4* 12.7* 16.8* 15.6* 11.1*  NEUTROABS 9.5*  --   --   --   --   --   HGB 7.2* 9.1* 7.4* 9.4* 8.6* 7.8*  HCT 22.9* 27.5* 22.5* 28.6* 26.4* 23.3*  MCV 89.8 89.9 90.4 88.8 89.5 90.0  PLT 203 147* 145* 130* 126* 654*   Basic Metabolic Panel: Recent Labs  Lab 10/03/17 1243 10/04/17 0533 10/04/17 1932 10/05/17 0825 10/05/17 1455 10/06/17 0308  NA 138 141  --  144  --  142  K 5.3* 5.9* 5.8* 5.6* 5.1 4.5  CL 110 116*  --  118*  --  119*  CO2 18* 15*  --  15*  --  17*  GLUCOSE 130* 128*  --  121*  --  104*  BUN 107* 108*  --  89*  --  77*  CREATININE 3.62* 3.26*  --  2.94*  --  2.71*  CALCIUM 7.9* 7.6*  --  7.9*  --  7.7*   GFR: Estimated Creatinine Clearance: 21.7 mL/min (A) (by C-G formula based on SCr of 2.71 mg/dL (H)). Liver Function Tests: Recent Labs  Lab 10/03/17 1243 10/04/17 0533  AST 18 24  ALT 17 15*  ALKPHOS 43 33*  BILITOT 0.6 1.2  PROT 5.2* 4.5*  ALBUMIN 2.9* 2.4*   No results for input(s): LIPASE, AMYLASE in the last 168 hours. No results for input(s): AMMONIA in the last 168 hours. Coagulation Profile: Recent Labs  Lab 10/03/17 1243  INR 1.20   Cardiac Enzymes: Recent Labs  Lab 10/04/17 2335 10/05/17 0521 10/05/17 1455 10/05/17 2016 10/06/17 0308  TROPONINI 1.00* 0.99* 4.97* 11.05* 20.54*   BNP (last 3 results) No results for input(s): PROBNP in the last 8760 hours. HbA1C: No results for input(s): HGBA1C in the last 72 hours. CBG: No results for input(s): GLUCAP in the last 168 hours. Lipid Profile: No results for input(s): CHOL, HDL, LDLCALC, TRIG, CHOLHDL, LDLDIRECT in the last 72 hours. Thyroid Function Tests: Recent Labs    10/03/17 1429  TSH 2.621    Anemia Panel: Recent Labs    10/03/17 1429  VITAMINB12 342  FOLATE 8.4  FERRITIN 93  TIBC 227*  IRON 63  RETICCTPCT 2.2   Sepsis Labs: Recent Labs  Lab 10/03/17 1257  LATICACIDVEN 1.62    Recent Results (from the past 240 hour(s))  MRSA PCR Screening     Status: Abnormal   Collection Time: 10/03/17  4:58 PM  Result Value Ref Range Status   MRSA by PCR POSITIVE (A) NEGATIVE Final    Comment:        The GeneXpert MRSA Assay (FDA approved for NASAL specimens only), is one component of a comprehensive MRSA colonization surveillance program. It is not intended to diagnose MRSA infection nor to guide or monitor treatment for MRSA infections. RESULT CALLED TO, READ BACK BY AND VERIFIED WITH: O.[email or web address],RN AT 2152 BY L.PITT 10/03/17          Radiology Studies: Ir Angiogram Visceral Selective  Result Date: 10/04/2017 CLINICAL DATA:  Upper  GI bleed complicated by hypotension and requiring blood transfusion. Endoscopy demonstrates large duodenal ulcer with adherent blood clot. Adequate clipping for hemostasis could not be achieved endoscopically. Empiric embolization of the supplying gastroduodenal artery is requested. Renal insufficiency. EXAM: ADDITIONAL ARTERIOGRAPHY; IR ULTRASOUND GUIDANCE VASC ACCESS RIGHT; SELECTIVE VISCERAL ARTERIOGRAPHY; IR EMBO ART VEN HEMORR LYMPH EXTRAV INC GUIDE ROADMAPPING ANESTHESIA/SEDATION: Intravenous Fentanyl and Versed were administered as conscious sedation during continuous monitoring of the patient's level of consciousness and physiological / cardiorespiratory status by the radiology RN, with a total moderate sedation time of 26 minutes. MEDICATIONS: Lidocaine 1% subcutaneous CONTRAST:  15 mL Isovue IA PROCEDURE: The procedure, risks (including but not limited to bleeding, infection, organ damage ), benefits, and alternatives were explained to the patient. Questions regarding the procedure were encouraged and answered. The patient understands and  consents to the procedure. Right femoral region prepped and draped in usual sterile fashion. Maximal barrier sterile technique was utilized including caps, mask, sterile gowns, sterile gloves, sterile drape, hand hygiene and skin antiseptic. The right common femoral artery was demonstrated to be patent under ultrasound, and documentation stored. Under real-time ultrasound guidance, the vessel was accessed with a 21-gauge micropuncture needle, exchanged over a 018 guidewire for a transitional dilator, through which a 035 guidewire was advanced. Over this, a 5 [place] vascular sheath was placed, through which a 5 [place] C2 catheter was advanced and used to selectively catheterize the celiac axis. Using an angled Glidewire, the catheter was advanced to the distal common hepatic artery. A coaxial Renegade microcatheter with angled Transcend guidewire was coaxially advanced into the gastroduodenal artery. After confirmatory arteriography, the artery was embolized with 5 mm interlock coils, until cessation of flow was achieved. After confirmatory arteriogram, the microcatheter and diagnostic catheter if and sheath were removed and hemostasis achieved with the aid of the Exoseal device after confirmatory femoral arteriography. The patient tolerated the procedure well. COMPLICATIONS: None immediate FINDINGS: No evidence of active extravasation on selective celiac arteriography, common hepatic arteriography, and gastroduodenal arteriography. However, given the findings on recent endoscopy, we proceeded with empiric embolization of the gastroduodenal artery as planned. No other arterial abnormalities were identified. IMPRESSION: 1. Technically successful empiric coil embolization of the gastroduodenal artery to control bleeding duodenal ulcer. Electronically Signed   By: Lucrezia Europe M.D.   On: 10/04/2017 14:36   Ir Angiogram Selective Each Additional Vessel  Result Date: 10/04/2017 CLINICAL DATA:  Upper GI bleed complicated  by hypotension and requiring blood transfusion. Endoscopy demonstrates large duodenal ulcer with adherent blood clot. Adequate clipping for hemostasis could not be achieved endoscopically. Empiric embolization of the supplying gastroduodenal artery is requested. Renal insufficiency. EXAM: ADDITIONAL ARTERIOGRAPHY; IR ULTRASOUND GUIDANCE VASC ACCESS RIGHT; SELECTIVE VISCERAL ARTERIOGRAPHY; IR EMBO ART VEN HEMORR LYMPH EXTRAV INC GUIDE ROADMAPPING ANESTHESIA/SEDATION: Intravenous Fentanyl and Versed were administered as conscious sedation during continuous monitoring of the patient's level of consciousness and physiological / cardiorespiratory status by the radiology RN, with a total moderate sedation time of 26 minutes. MEDICATIONS: Lidocaine 1% subcutaneous CONTRAST:  15 mL Isovue IA PROCEDURE: The procedure, risks (including but not limited to bleeding, infection, organ damage ), benefits, and alternatives were explained to the patient. Questions regarding the procedure were encouraged and answered. The patient understands and consents to the procedure. Right femoral region prepped and draped in usual sterile fashion. Maximal barrier sterile technique was utilized including caps, mask, sterile gowns, sterile gloves, sterile drape, hand hygiene and skin antiseptic. The right common femoral artery was demonstrated to be  patent under ultrasound, and documentation stored. Under real-time ultrasound guidance, the vessel was accessed with a 21-gauge micropuncture needle, exchanged over a 018 guidewire for a transitional dilator, through which a 035 guidewire was advanced. Over this, a 5 [place] vascular sheath was placed, through which a 5 [place] C2 catheter was advanced and used to selectively catheterize the celiac axis. Using an angled Glidewire, the catheter was advanced to the distal common hepatic artery. A coaxial Renegade microcatheter with angled Transcend guidewire was coaxially advanced into the gastroduodenal  artery. After confirmatory arteriography, the artery was embolized with 5 mm interlock coils, until cessation of flow was achieved. After confirmatory arteriogram, the microcatheter and diagnostic catheter if and sheath were removed and hemostasis achieved with the aid of the Exoseal device after confirmatory femoral arteriography. The patient tolerated the procedure well. COMPLICATIONS: None immediate FINDINGS: No evidence of active extravasation on selective celiac arteriography, common hepatic arteriography, and gastroduodenal arteriography. However, given the findings on recent endoscopy, we proceeded with empiric embolization of the gastroduodenal artery as planned. No other arterial abnormalities were identified. IMPRESSION: 1. Technically successful empiric coil embolization of the gastroduodenal artery to control bleeding duodenal ulcer. Electronically Signed   By: Lucrezia Europe M.D.   On: 10/04/2017 14:36   Ir US Guide Vasc Access Right  Result Date: 10/04/2017 CLINICAL DATA:  Upper GI bleed complicated by hypotension and requiring blood transfusion. Endoscopy demonstrates large duodenal ulcer with adherent blood clot. Adequate clipping for hemostasis could not be achieved endoscopically. Empiric embolization of the supplying gastroduodenal artery is requested. Renal insufficiency. EXAM: ADDITIONAL ARTERIOGRAPHY; IR ULTRASOUND GUIDANCE VASC ACCESS RIGHT; SELECTIVE VISCERAL ARTERIOGRAPHY; IR EMBO ART VEN HEMORR LYMPH EXTRAV INC GUIDE ROADMAPPING ANESTHESIA/SEDATION: Intravenous Fentanyl and Versed were administered as conscious sedation during continuous monitoring of the patient's level of consciousness and physiological / cardiorespiratory status by the radiology RN, with a total moderate sedation time of 26 minutes. MEDICATIONS: Lidocaine 1% subcutaneous CONTRAST:  15 mL Isovue IA PROCEDURE: The procedure, risks (including but not limited to bleeding, infection, organ damage ), benefits, and alternatives  were explained to the patient. Questions regarding the procedure were encouraged and answered. The patient understands and consents to the procedure. Right femoral region prepped and draped in usual sterile fashion. Maximal barrier sterile technique was utilized including caps, mask, sterile gowns, sterile gloves, sterile drape, hand hygiene and skin antiseptic. The right common femoral artery was demonstrated to be patent under ultrasound, and documentation stored. Under real-time ultrasound guidance, the vessel was accessed with a 21-gauge micropuncture needle, exchanged over a 018 guidewire for a transitional dilator, through which a 035 guidewire was advanced. Over this, a 5 [place] vascular sheath was placed, through which a 5 [place] C2 catheter was advanced and used to selectively catheterize the celiac axis. Using an angled Glidewire, the catheter was advanced to the distal common hepatic artery. A coaxial Renegade microcatheter with angled Transcend guidewire was coaxially advanced into the gastroduodenal artery. After confirmatory arteriography, the artery was embolized with 5 mm interlock coils, until cessation of flow was achieved. After confirmatory arteriogram, the microcatheter and diagnostic catheter if and sheath were removed and hemostasis achieved with the aid of the Exoseal device after confirmatory femoral arteriography. The patient tolerated the procedure well. COMPLICATIONS: None immediate FINDINGS: No evidence of active extravasation on selective celiac arteriography, common hepatic arteriography, and gastroduodenal arteriography. However, given the findings on recent endoscopy, we proceeded with empiric embolization of the gastroduodenal artery as planned. No other arterial abnormalities were identified.  IMPRESSION: 1. Technically successful empiric coil embolization of the gastroduodenal artery to control bleeding duodenal ulcer. Electronically Signed   By: Lucrezia Europe M.D.   On: 10/04/2017  14:36   Ir Embo Art  [place] Guide Roadmapping  Result Date: 10/04/2017 CLINICAL DATA:  Upper GI bleed complicated by hypotension and requiring blood transfusion. Endoscopy demonstrates large duodenal ulcer with adherent blood clot. Adequate clipping for hemostasis could not be achieved endoscopically. Empiric embolization of the supplying gastroduodenal artery is requested. Renal insufficiency. EXAM: ADDITIONAL ARTERIOGRAPHY; IR ULTRASOUND GUIDANCE VASC ACCESS RIGHT; SELECTIVE VISCERAL ARTERIOGRAPHY; IR EMBO ART VEN HEMORR LYMPH EXTRAV INC GUIDE ROADMAPPING ANESTHESIA/SEDATION: Intravenous Fentanyl and Versed were administered as conscious sedation during continuous monitoring of the patient's level of consciousness and physiological / cardiorespiratory status by the radiology RN, with a total moderate sedation time of 26 minutes. MEDICATIONS: Lidocaine 1% subcutaneous CONTRAST:  15 mL Isovue IA PROCEDURE: The procedure, risks (including but not limited to bleeding, infection, organ damage ), benefits, and alternatives were explained to the patient. Questions regarding the procedure were encouraged and answered. The patient understands and consents to the procedure. Right femoral region prepped and draped in usual sterile fashion. Maximal barrier sterile technique was utilized including caps, mask, sterile gowns, sterile gloves, sterile drape, hand hygiene and skin antiseptic. The right common femoral artery was demonstrated to be patent under ultrasound, and documentation stored. Under real-time ultrasound guidance, the vessel was accessed with a 21-gauge micropuncture needle, exchanged over a 018 guidewire for a transitional dilator, through which a 035 guidewire was advanced. Over this, a 5 [place] vascular sheath was placed, through which a 5 [place] C2 catheter was advanced and used to selectively catheterize the celiac axis. Using an angled Glidewire, the catheter was advanced to the  distal common hepatic artery. A coaxial Renegade microcatheter with angled Transcend guidewire was coaxially advanced into the gastroduodenal artery. After confirmatory arteriography, the artery was embolized with 5 mm interlock coils, until cessation of flow was achieved. After confirmatory arteriogram, the microcatheter and diagnostic catheter if and sheath were removed and hemostasis achieved with the aid of the Exoseal device after confirmatory femoral arteriography. The patient tolerated the procedure well. COMPLICATIONS: None immediate FINDINGS: No evidence of active extravasation on selective celiac arteriography, common hepatic arteriography, and gastroduodenal arteriography. However, given the findings on recent endoscopy, we proceeded with empiric embolization of the gastroduodenal artery as planned. No other arterial abnormalities were identified. IMPRESSION: 1. Technically successful empiric coil embolization of the gastroduodenal artery to control bleeding duodenal ulcer. Electronically Signed   By: Lucrezia Europe M.D.   On: 10/04/2017 14:36        Scheduled Meds: . carvedilol  3.125 mg Oral BID WC  . Chlorhexidine Gluconate Cloth  6 each Topical Q0600  . cholecalciferol  1,000 Units Oral BID  . dorzolamide  1 drop Both Eyes BID   And  . timolol  1 drop Both Eyes BID  . isosorbide mononitrate  60 mg Oral Daily  . latanoprost  1 drop Both Eyes QHS  . mupirocin ointment  1 application Nasal BID  . [START ON 10/07/2017] pantoprazole  40 mg Intravenous Q12H   Continuous Infusions: . pantoprozole (PROTONIX) infusion 8 mg/hr (10/05/17 2153)     LOS: 3 days     Cordelia Poche, MD [facility] 10/06/2017, 9:29 AM Pager: [phone]  If 7PM-7AM, please contact night-coverage [email or web address] Password [facility] 10/06/2017, 9:29 AM

## 2017-10-07 LAB — CBC
HEMATOCRIT: 28 % — AB (ref 39.0–52.0)
HEMOGLOBIN: 9.4 g/dL — AB (ref 13.0–17.0)
MCH: 30.3 pg (ref 26.0–34.0)
MCHC: 33.6 g/dL (ref 30.0–36.0)
MCV: 90.3 fL (ref 78.0–100.0)
Platelets: 122 10*3/uL — ABNORMAL LOW (ref 150–400)
RBC: 3.1 MIL/uL — ABNORMAL LOW (ref 4.22–5.81)
RDW: 14.9 % (ref 11.5–15.5)
WBC: 7.8 10*3/uL (ref 4.0–10.5)

## 2017-10-07 LAB — TYPE AND SCREEN
ABO/RH(D): AB POS
Antibody Screen: NEGATIVE
Unit division: 0
Unit division: 0
Unit division: 0
Unit division: 0
Unit division: 0
Unit division: 0

## 2017-10-07 LAB — BPAM RBC
Blood Product Expiration Date: 201903142359
Blood Product Expiration Date: 201903142359
Blood Product Expiration Date: 201904052359
Blood Product Expiration Date: 201904082359
Blood Product Expiration Date: 201904082359
Blood Product Expiration Date: 201904082359
ISSUE DATE / TIME: 201903071456
ISSUE DATE / TIME: 201903071747
ISSUE DATE / TIME: 201903080838
ISSUE DATE / TIME: 201903081409
ISSUE DATE / TIME: 201903101707
ISSUE DATE / TIME: 201903102025
Unit Type and Rh: 6200
Unit Type and Rh: 6200
Unit Type and Rh: 8400
Unit Type and Rh: 8400
Unit Type and Rh: 8400
Unit Type and Rh: 8400

## 2017-10-07 LAB — BASIC METABOLIC PANEL
ANION GAP: 7 (ref 5–15)
BUN: 60 mg/dL — AB (ref 6–20)
CO2: 20 mmol/L — AB (ref 22–32)
Calcium: 7.6 mg/dL — ABNORMAL LOW (ref 8.9–10.3)
Chloride: 114 mmol/L — ABNORMAL HIGH (ref 101–111)
Creatinine, Ser: 2.62 mg/dL — ABNORMAL HIGH (ref 0.61–1.24)
GFR calc Af Amer: 24 mL/min — ABNORMAL LOW (ref >60)
GFR calc non Af Amer: 20 mL/min — ABNORMAL LOW (ref >60)
GLUCOSE: 93 mg/dL (ref 65–99)
POTASSIUM: 3.9 mmol/L (ref 3.5–5.1)
Sodium: 141 mmol/L (ref 135–145)

## 2017-10-07 LAB — HEMOGLOBIN AND HEMATOCRIT, BLOOD
HCT: 28.4 % — ABNORMAL LOW (ref 39.0–52.0)
Hemoglobin: 9.5 g/dL — ABNORMAL LOW (ref 13.0–17.0)

## 2017-10-07 LAB — TROPONIN I: TROPONIN I: 12.14 ng/mL — AB (ref <0.03)

## 2017-10-07 NOTE — Evaluation (Signed)
Occupational Therapy Evaluation Patient Details Name: Bradley [name] MRN: [id number] DOB: Jun 23, 1928 Today's Date: 10/07/2017    History of Present Illness Bradley A Arledgeis a 84 y.o.malewith a history of hypertension, GERD, pacemaker placement, CKD stage IV, BPH, CHF with EF 30%, CAD. He presented with weakness and GI bleeding. Patient with frequent hematochezia. GI consulted for evaluation and management. Troponin elevated with EKG changes and found to have NSTEMI 3/9. Cardiology consulted and recommending medical management (not a candidate for cath).    Clinical Impression   PTA, pt reports modified independence with basic ADL and functional mobility. He is; however, unable to complete tub/shower transfers due to safety concerns and will sit next to tub and utilize hand held shower head to bathe himself. Pt currently limited by poor activity tolerance for ADL, generalized weakness, and decreased awareness and attention. He requires min assist for LB ADL and toilet transfers. He would benefit from continued OT services while admitted to maximize independence and safety with ADL and functional mobility. Feel with acute therapy services, pt will progress to be able to D/C home with wife's assistance and home health OT follow-up. Will continue to follow.     Follow Up Recommendations  Home health OT;Supervision/Assistance - 24 hour    Equipment Recommendations  3 in 1 bedside commode    Recommendations for Other Services       Precautions / Restrictions Precautions Precautions: Fall Restrictions Weight Bearing Restrictions: No      Mobility Bed Mobility Overal bed mobility: Needs Assistance Bed Mobility: Supine to Sit;Sit to Supine     Supine to sit: Supervision Sit to supine: Supervision   General bed mobility comments: Increased time and effort. Cues to sequence and attend to task.   Transfers Overall transfer level: Needs assistance Equipment used: Rolling walker  (2 wheeled) Transfers: Sit to/from Stand Sit to Stand: Min assist         General transfer comment: Min assist to power up. Pt heavily pulling on RW to power up despite cues.     Balance Overall balance assessment: Needs assistance Sitting-balance support: Bilateral upper extremity supported;Feet supported Sitting balance-Leahy Scale: Fair     Standing balance support: No upper extremity supported Standing balance-Leahy Scale: Fair Standing balance comment: Able to statically stand with no UE support; stability much improved with BUE support                           ADL either performed or assessed with clinical judgement   ADL Overall ADL's : Needs assistance/impaired Eating/Feeding: Set up;Sitting   Grooming: Set up;Sitting   Upper Body Bathing: Min guard;Sitting   Lower Body Bathing: Minimal assistance;Sit to/from stand   Upper Body Dressing : Min guard;Sitting   Lower Body Dressing: Minimal assistance;Sit to/from stand   Toilet Transfer: Minimal assistance;Ambulation;Comfort height toilet;RW Armed forces technical officer Details (indicate cue type and reason): Poor management of RW and attempting to pull up on RW despite education.  Toileting- Clothing Manipulation and Hygiene: Moderate assistance;Sit to/from stand       Functional mobility during ADLs: Minimal assistance;Rolling walker General ADL Comments: Pt able to engage but reports fatigue this afternoon.      Vision Patient Visual Report: No change from baseline(R eye laterally rotated at baseline) Additional Comments: Able to utilize vision functionally. Reports some double vision/blurred vision at times but not at this time.      Perception     Praxis  Pertinent Vitals/Pain Pain Assessment: No/denies pain     Hand Dominance     Extremity/Trunk Assessment Upper Extremity Assessment Upper Extremity Assessment: Generalized weakness(limited B shoulder AROM)   Lower Extremity Assessment Lower  Extremity Assessment: Generalized weakness RLE Deficits / Details: history of L TKA       Communication Communication Communication: No difficulties   Cognition Arousal/Alertness: Lethargic Behavior During Therapy: WFL for tasks assessed/performed Overall Cognitive Status: History of cognitive impairments - at baseline                                 General Comments: Pt repeating himself throughout session. Requires cues to maintain attention to task at hand and for safety cues. Likely close to baseline.    General Comments  VSS although monitor did read some     Exercises     Shoulder Instructions      Home Living Family/patient expects to be discharged to:: Private residence Living Arrangements: Spouse/significant other Available Help at Discharge: Family;Available 24 hours/day Type of Home: House Home Access: Stairs to enter [employer] of Steps: 1 Entrance Stairs-Rails: None Home Layout: Two level Alternate Level Stairs-Number of Steps: has chair lift   Bathroom Shower/Tub: Teacher, early years/pre: Standard     Home Equipment: Environmental consultant - 2 wheels;Cane - single point   Additional Comments: Uses cane in community. Furniture walks and uses doorknobs to stabilize himself. Sits on stool outside of tub and places feet into tub for bathing.       Prior Functioning/Environment Level of Independence: Needs assistance  Gait / Transfers Assistance Needed: use of SPC/RW for mobility ADL's / Homemaking Assistance Needed: reports independence wtih basic ADL            OT Problem List: Decreased strength;Impaired balance (sitting and/or standing);Decreased safety awareness      OT Treatment/Interventions: Self-care/ADL training;Therapeutic exercise;Energy conservation;DME and/or AE instruction;Therapeutic activities;Patient/family education;Balance training    OT Goals(Current goals can be found in the care plan section) Acute Rehab OT  Goals Patient Stated Goal: return home OT Goal Formulation: With patient Time For Goal Achievement: 10/21/17 Potential to Achieve Goals: Good ADL Goals Pt Will Perform Grooming: with modified independence;standing Pt Will Perform Lower Body Dressing: with modified independence;sit to/from stand Pt Will Transfer to Toilet: with modified independence;ambulating;bedside commode(BSC over toilet) Pt Will Perform Toileting - Clothing Manipulation and hygiene: with modified independence;sit to/from stand Pt Will Perform Tub/Shower Transfer: with min assist;ambulating;3 in 1;rolling walker  OT Frequency: Min 2X/week   Barriers to D/C:            Co-evaluation              AM-PAC PT "6 Clicks" Daily Activity     Outcome Measure Help from another person eating meals?: None Help from another person taking care of personal grooming?: A Little Help from another person toileting, which includes using toliet, bedpan, or urinal?: A Little Help from another person bathing (including washing, rinsing, drying)?: A Little Help from another person to put on and taking off regular upper body clothing?: A Little Help from another person to put on and taking off regular lower body clothing?: A Little 6 Click Score: 19   End of Session Equipment Utilized During Treatment: Gait belt  Activity Tolerance: Patient tolerated treatment well Patient left: in bed;with call bell/phone within reach;with bed alarm set  OT Visit Diagnosis: Unsteadiness on feet (R26.81);History of  falling (Z91.81);Other abnormalities of gait and mobility (R26.89)                Time: 2761-8485 OT Time Calculation (min): 35 min Charges:  OT General Charges $OT Visit: 1 Visit OT Evaluation $OT Eval Moderate Complexity: 1 Mod OT Treatments $Self Care/Home Management : 8-22 mins G-Codes:     Norman Herrlich, MS OTR/L  Pager: [place] [facility] [place] 10/07/2017, 5:26 PM

## 2017-10-07 NOTE — Progress Notes (Signed)
PROGRESS NOTE    Bradley [name]  KNL:[id number] DOB: November 23, 1927 DOA: 10/03/2017 PCP: [name], MD   Brief Narrative: Bradley [name] is a 84 y.o. male with a history of hypertension, GERD, pacemaker placement, CKD stage IV, BPH, CHF with EF 30%, CAD. He presented with weakness and GI bleeding. Patient with frequent hematochezia. GI consulted for evaluation and management. Troponin elevated with EKG changes. Cardiology consulted and recommending medical management (not a candidate for cath)   Assessment & Plan:   Principal Problem:   GIB (gastrointestinal bleeding) Active Problems:   GERD (gastroesophageal reflux disease)   Osteoarthritis of both knees   Chronic kidney disease, stage IV (severe) (HCC)   Chronic combined systolic (congestive) and diastolic (congestive) heart failure (HCC)   CAD in native artery   Symptomatic bradycardia   Benign prostatic hyperplasia   Status post placement of cardiac pacemaker   Abdominal pain   Symptomatic anemia   Protein-calorie malnutrition, moderate (HCC)   NSTEMI (non-ST elevated myocardial infarction) ([place])   GI bleed Recurrent bleeding overnight requiring another unit of blood. S/p GDA coil embolization. No recurrent bleeding so far. Had hemoglobin down to hemoglobin of 6.8 that responded well to 2 units of PRBC (3/10) up to 9.4 today GI recommendations -PPI -Transfuse for symptoms or hemoglobin <8  Acute blood loss anemia Secondary to GI bleeding from ulcer. Stable hemoglobin. -Transfusions/management as above  Frequent falls Likely secondary to blood loss. CT negative. Recently diagnosed with UTI. Completed course.  NSTEMI Episode of severe chest pain on 3/9 with significant elevation of troponin overnight. Restarted Imdur. Cardiology reconsulted on 3/9 and is continuing medical management. Cardiology started Coreg. Troponin trended down. -Cardiology recommendations: continue medical management  GERD Stable -Continue  protonix  Acute on chronic CKD 4 Baseline creatinine of about 3-3.2. Secondary to hypovolemia from acute blood loss. Resolved. Currently below baseline.  CAD Stable. Chest pain episode on 3/9. Troponin increasing. Aspirin held initially secondary to GI bleeding. -Discontinued aspirin after clarification by GI  Essential hypertension Coreg, lasix, hydralazine, Imdur on hold secondary to GI bleeding and hypotension  Hyperkalemia Resolved today. Given calcium gluconate yesterday. Also given lasix/small IV fluid bolus and Kayexalate.   Chronic combined systolic and diastolic heart failure EF of 25-30% with diffuse hypokinesis and inferior akinesis. Euvolemic.  Leukocytosis No evidence of infection. Afebrile. Trending down.  Metabolic acidosis In setting of recent AKI. Improving.  Hypocalcemia Adjusted calcium of 8.5 in setting of hypoalbuminemia and chronic kidney disease.   DVT prophylaxis: SCDs Code Status:   Code Status: Full Code Family Communication: None at bedside Disposition Plan: Discharge pending PT eval   Consultants:   Gastroenterology  Procedures:   PRBC, 2 units (3/7)  PRBC, 2 units (3/8)  PRBC, 2 units (3/10)  Antimicrobials:  None    Subjective: No chest pain. Feeling better than yesterday. No bloody stools/hemoptysis  Objective: Vitals:   10/07/17 0410 10/07/17 0500 10/07/17 0847 10/07/17 0914  BP:   104/67 104/67  Pulse:   84 90  Resp:   20   Temp: 98.2 F (36.8 C)  98.7 F (37.1 C)   TempSrc:   Oral   SpO2:      Weight:  84.7 kg (186 lb 11.7 oz)    Height:        Intake/Output Summary (Last 24 hours) at 10/07/2017 1029 Last data filed at 10/07/2017 0930 Gross per 24 hour  Intake 1022.83 ml  Output 1310 ml  Net -287.17 ml   Bradley [name]  Weights   10/05/17 0433 10/06/17 0500 10/07/17 0500  Weight: 83.5 kg (184 lb 1.4 oz) 84 kg (185 lb 3 oz) 84.7 kg (186 lb 11.7 oz)    Examination:  General exam: Appears calm and  comfortable HEENT: strabismus Respiratory: Clear to auscultation bilaterally. Unlabored work of breathing. No wheezing or rales. Cardiovascular: Regular rate and rhythm. Normal S1 and S2. No heart murmurs present. No extra heart sounds Gastrointestinal system: Abdomen is nondistended, soft and tender in epigastrium. No organomegaly or masses felt. Central nervous system: Alert and oriented to person, place and time. Extremities: No edema. No calf tenderness Skin: No cyanosis. No rashes Psychiatry: Judgement and insight appear normal. Flat affect    Data Reviewed: I have personally reviewed following labs and imaging studies  CBC: Recent Labs  Lab 10/03/17 1243  10/04/17 0533 10/04/17 1932 10/05/17 0825 10/06/17 0308 10/06/17 1422 10/07/17 0736 10/07/17 0833  WBC 11.4*   < > 12.7* 16.8* 15.6* 11.1*  --   --  7.8  NEUTROABS 9.5*  --   --   --   --   --   --   --   --   HGB 7.2*   < > 7.4* 9.4* 8.6* 7.8* 6.8* 9.5* 9.4*  HCT 22.9*   < > 22.5* 28.6* 26.4* 23.3* 20.9* 28.4* 28.0*  MCV 89.8   < > 90.4 88.8 89.5 90.0  --   --  90.3  PLT 203   < > 145* 130* 126* 134*  --   --  122*   < > = values in this interval not displayed.   Basic Metabolic Panel: Recent Labs  Lab 10/03/17 1243 10/04/17 0533 10/04/17 1932 10/05/17 0825 10/05/17 1455 10/06/17 0308 10/07/17 0630  NA 138 141  --  144  --  142 141  K 5.3* 5.9* 5.8* 5.6* 5.1 4.5 3.9  CL 110 116*  --  118*  --  119* 114*  CO2 18* 15*  --  15*  --  17* 20*  GLUCOSE 130* 128*  --  121*  --  104* 93  BUN 107* 108*  --  89*  --  77* 60*  CREATININE 3.62* 3.26*  --  2.94*  --  2.71* 2.62*  CALCIUM 7.9* 7.6*  --  7.9*  --  7.7* 7.6*   GFR: Estimated Creatinine Clearance: 22.4 mL/min (A) (by C-G formula based on SCr of 2.62 mg/dL (H)). Liver Function Tests: Recent Labs  Lab 10/03/17 1243 10/04/17 0533  AST 18 24  ALT 17 15*  ALKPHOS 43 33*  BILITOT 0.6 1.2  PROT 5.2* 4.5*  ALBUMIN 2.9* 2.4*   No results for input(s):  LIPASE, AMYLASE in the last 168 hours. No results for input(s): AMMONIA in the last 168 hours. Coagulation Profile: Recent Labs  Lab 10/03/17 1243  INR 1.20   Cardiac Enzymes: Recent Labs  Lab 10/05/17 1455 10/05/17 2016 10/06/17 0308 10/06/17 0915 10/07/17 0630  TROPONINI 4.97* 11.05* 20.54* 21.76* 12.14*   BNP (last 3 results) No results for input(s): PROBNP in the last 8760 hours. HbA1C: No results for input(s): HGBA1C in the last 72 hours. CBG: No results for input(s): GLUCAP in the last 168 hours. Lipid Profile: No results for input(s): CHOL, HDL, LDLCALC, TRIG, CHOLHDL, LDLDIRECT in the last 72 hours. Thyroid Function Tests: No results for input(s): TSH, T4TOTAL, FREET4, T3FREE, THYROIDAB in the last 72 hours. Anemia Panel: No results for input(s): VITAMINB12, FOLATE, FERRITIN, TIBC, IRON, RETICCTPCT in the last 72  hours. Sepsis Labs: Recent Labs  Lab 10/03/17 1257  LATICACIDVEN 1.62    Recent Results (from the past 240 hour(s))  MRSA PCR Screening     Status: Abnormal   Collection Time: 10/03/17  4:58 PM  Result Value Ref Range Status   MRSA by PCR POSITIVE (A) NEGATIVE Final    Comment:        The GeneXpert MRSA Assay (FDA approved for NASAL specimens only), is one component of a comprehensive MRSA colonization surveillance program. It is not intended to diagnose MRSA infection nor to guide or monitor treatment for MRSA infections. RESULT CALLED TO, READ BACK BY AND VERIFIED WITH: O.OLFIN,RN AT 2152 BY L.PITT 10/03/17          Radiology Studies: No results found.      Scheduled Meds: . carvedilol  3.125 mg Oral BID WC  . Chlorhexidine Gluconate Cloth  6 each Topical Q0600  . cholecalciferol  1,000 Units Oral BID  . dorzolamide  1 drop Both Eyes BID   And  . timolol  1 drop Both Eyes BID  . isosorbide mononitrate  60 mg Oral Daily  . latanoprost  1 drop Both Eyes QHS  . mupirocin ointment  1 application Nasal BID  . pantoprazole  40  mg Intravenous Q12H   Continuous Infusions: . pantoprozole (PROTONIX) infusion 8 mg/hr (10/07/17 0727)     LOS: 4 days     Cordelia Poche, MD [facility] 10/07/2017, 10:29 AM Pager: [phone]  If 7PM-7AM, please contact night-coverage [email or web address] Password [facility] 10/07/2017, 10:29 AM

## 2017-10-07 NOTE — Progress Notes (Addendum)
Progress Note  Patient Name: Bradley [name] Date of Encounter: 10/07/2017  Primary Cardiologist: Sinclair Grooms, MD   Subjective   Denies any chest pain or pressure, no SOB  Inpatient Medications    Scheduled Meds: . carvedilol  3.125 mg Oral BID WC  . Chlorhexidine Gluconate Cloth  6 each Topical Q0600  . cholecalciferol  1,000 Units Oral BID  . dorzolamide  1 drop Both Eyes BID   And  . timolol  1 drop Both Eyes BID  . isosorbide mononitrate  60 mg Oral Daily  . latanoprost  1 drop Both Eyes QHS  . mupirocin ointment  1 application Nasal BID  . pantoprazole  40 mg Intravenous Q12H   Continuous Infusions: . pantoprozole (PROTONIX) infusion 8 mg/hr (10/07/17 0727)   PRN Meds: acetaminophen **OR** acetaminophen, bisacodyl, HYDROcodone-acetaminophen, morphine injection, nitroGLYCERIN, ondansetron **OR** ondansetron (ZOFRAN) IV, senna-docusate   Vital Signs    Vitals:   10/07/17 0410 10/07/17 0500 10/07/17 0847 10/07/17 0914  BP:   104/67 104/67  Pulse:   84 90  Resp:   20   Temp: 98.2 F (36.8 C)  98.7 F (37.1 C)   TempSrc:   Oral   SpO2:      Weight:  186 lb 11.7 oz (84.7 kg)    Height:        Intake/Output Summary (Last 24 hours) at 10/07/2017 1058 Last data filed at 10/07/2017 0930 Gross per 24 hour  Intake 1022.83 ml  Output 1310 ml  Net -287.17 ml   Filed Weights   10/05/17 0433 10/06/17 0500 10/07/17 0500  Weight: 184 lb 1.4 oz (83.5 kg) 185 lb 3 oz (84 kg) 186 lb 11.7 oz (84.7 kg)    Telemetry    NSR - Personally Reviewed  ECG    No new EKG to review - Personally Reviewed  Physical Exam   GEN: No acute distress.   Neck: No JVD Cardiac: RRR, no murmurs, rubs, or gallops.  Respiratory: Clear to auscultation bilaterally. GI: Soft, nontender, non-distended  MS: No edema; No deformity. Neuro:  Nonfocal  Psych: Normal affect   Labs    Chemistry Recent Labs  Lab 10/03/17 1243 10/04/17 0533  10/05/17 0825 10/05/17 1455  10/06/17 0308 10/07/17 0630  NA 138 141  --  144  --  142 141  K 5.3* 5.9*   < > 5.6* 5.1 4.5 3.9  CL 110 116*  --  118*  --  119* 114*  CO2 18* 15*  --  15*  --  17* 20*  GLUCOSE 130* 128*  --  121*  --  104* 93  BUN 107* 108*  --  89*  --  77* 60*  CREATININE 3.62* 3.26*  --  2.94*  --  2.71* 2.62*  CALCIUM 7.9* 7.6*  --  7.9*  --  7.7* 7.6*  PROT 5.2* 4.5*  --   --   --   --   --   ALBUMIN 2.9* 2.4*  --   --   --   --   --   AST 18 24  --   --   --   --   --   ALT 17 15*  --   --   --   --   --   ALKPHOS 43 33*  --   --   --   --   --   BILITOT 0.6 1.2  --   --   --   --   --  GFRNONAA 14* 16*  --  18*  --  20* 20*  GFRAA 16* 18*  --  21*  --  23* 24*  ANIONGAP 10 10  --  11  --  6 7   < > = values in this interval not displayed.     Hematology Recent Labs  Lab 10/05/17 0825 10/06/17 0308 10/06/17 1422 10/07/17 0736 10/07/17 0833  WBC 15.6* 11.1*  --   --  7.8  RBC 2.95* 2.59*  --   --  3.10*  HGB 8.6* 7.8* 6.8* 9.5* 9.4*  HCT 26.4* 23.3* 20.9* 28.4* 28.0*  MCV 89.5 90.0  --   --  90.3  MCH 29.2 30.1  --   --  30.3  MCHC 32.6 33.5  --   --  33.6  RDW 14.7 15.2  --   --  14.9  PLT 126* 134*  --   --  122*    Cardiac Enzymes Recent Labs  Lab 10/05/17 2016 10/06/17 0308 10/06/17 0915 10/07/17 0630  TROPONINI 11.05* 20.54* 21.76* 12.14*    Recent Labs  Lab 10/03/17 1254  TROPIPOC 0.03     BNPNo results for input(s): BNP, PROBNP in the last 168 hours.   DDimer No results for input(s): DDIMER in the last 168 hours.   Radiology    No results found.  Cardiac Studies   heart failure, coronary artery disease, chronic kidney disease, hypertension and pacemakerwho is was seen for the evaluation of syncope and elevated troponin levelat the request of Dr. Lonny Prude.   Patient Profile     84 y.o. male wit history of heart failure, coronary artery disease, chronic kidney disease, hypertension and pacemakerwho is was seen for the evaluation of syncope and  elevated troponin level.  Assessment & Plan    NSTEMI : Trop peaked at 20 and trending down.  - medical management recommended due to advanced age and co morbidities including GIB. - continue ASA, BB and Imdur.  - cannot increase BB or nitrates further due to soft Bp - not a candidate for Plavix due to recent GIB.  CHRONIC SYSTOLIC AND DIASTOLIC HF:   - EF by echo is 25%.   - he appears euvolemic on exam today.  - he put out 1.4L yesterday - net positive 1L.  - weight up 6lbs from admit - continue carvedilol, Imdur - restart Hydralazine when BP ok - too soft right now.  May need to restart outpt  GI BLEED:   -managed with coiling although Hgb is lower.   -given recent NSTEMI would consider transfusion for Hbg below 9  No other recs at this time.  Will sign off.  Call with any questions    For questions or updates, please contact [place] Please consult [email or web address] for contact info under Cardiology/STEMI.      Signed, Fransico Him, MD  10/07/2017, 10:58 AM

## 2017-10-07 NOTE — Progress Notes (Addendum)
PT Cancellation Note  Patient Details Name: Bradley [name] MRN: [id number] DOB: 02-Dec-1927   Cancelled Treatment:    Reason Eval/Treat Not Completed: Medical issues which prohibited therapy.  Pt has spiked troponin with NSTEMI dx, will wait to see how pt progresses.     Ramond Dial 10/07/2017, 9:33 AM   Mee Hives, PT MS Acute Rehab Dept. Number: [place] and [place]

## 2017-10-07 NOTE — Progress Notes (Signed)
[facility] Progress Note  Bradley [name] 84 y.o. March 07, 1928  CC:  GI bleeding   Subjective:  patient sitting comfortably in the recliner. No bowel movement today. Denied any abdominal pain, nausea or vomiting.  ROS - patient denied chest pain and shortness of breath.  Objective: Vital signs in last 24 hours: Vitals:   10/07/17 0847 10/07/17 0914  BP: 104/67 104/67  Pulse: 84 90  Resp: 20   Temp: 98.7 F (37.1 C)   SpO2:      Physical Exam:  Gen. Alert oriented 3. Not in acute distress Abdomen soft, nontender, nondistended, bowel sounds present. No peritoneal signs Lower extremity. Trace edema  Lab Results: Recent Labs    10/06/17 0308 10/07/17 0630  NA 142 141  K 4.5 3.9  CL 119* 114*  CO2 17* 20*  GLUCOSE 104* 93  BUN 77* 60*  CREATININE 2.71* 2.62*  CALCIUM 7.7* 7.6*   No results for input(s): AST, ALT, ALKPHOS, BILITOT, PROT, ALBUMIN in the last 72 hours. Recent Labs    10/06/17 0308  10/07/17 0736 10/07/17 0833  WBC 11.1*  --   --  7.8  HGB 7.8*   < > 9.5* 9.4*  HCT 23.3*   < > 28.4* 28.0*  MCV 90.0  --   --  90.3  PLT 134*  --   --  122*   < > = values in this interval not displayed.   No results for input(s): LABPROT, INR in the last 72 hours.    Assessment/Plan: - Upper GI bleed. EGD on 10/04/2017 showed large duodenal bulb ulcer. Status post treatment with epinephrine injection. Underwent subsequent IR guided  embolization of GDA.  - acute blood loss anemia. Hemoglobin improved after blood transfusion. - NSTEMI - on medical management  Recommendations ------------------------ - no bowel movement today. Hemoglobin improved to 9.4 after 2 units of blood transfusion. - advance diet to full liquid. - IV twice a day PPI - Repeat CBC in the morning - GI will follow  Otis Brace MD, [place] 10/07/2017, 11:44 AM  Contact #  [phone]

## 2017-10-07 NOTE — Progress Notes (Signed)
Physical Therapy Treatment Patient Details Name: Bradley [name] MRN: [id number] DOB: Nov 03, 1927 Today's Date: 10/07/2017    History of Present Illness Rhys A Arledgeis a 84 y.o.malewith a history of hypertension, GERD, pacemaker placement, CKD stage IV, BPH, CHF with EF 30%, CAD. He presented with weakness and GI bleeding. Patient with frequent hematochezia. GI consulted for evaluation and management. Troponin elevated with EKG changes and found to have NSTEMI 3/9. Cardiology consulted and recommending medical management (not a candidate for cath).     PT Comments    Pt was tired this afternoon and was unable to walk far but made two trips in his room.  Pt has IV and multiple other telemetry lines, requires assistance to maneuver in his room.  Has been able to stand and use RW to maneuver, but gets caught in his lines with cues and assistance needed.  Pt is expecting to go home but may need a plan for SNF if he does not develop more independent presentation.  Follow Up Recommendations  Home health PT;Supervision/Assistance - 24 hour     Equipment Recommendations  None recommended by PT    Recommendations for Other Services OT consult     Precautions / Restrictions Precautions Precautions: Fall Restrictions Weight Bearing Restrictions: No    Mobility  Bed Mobility Overal bed mobility: Needs Assistance Bed Mobility: Supine to Sit;Sit to Supine     Supine to sit: Min guard;Min assist Sit to supine: Min guard;Min assist   General bed mobility comments: used trendelenburg to scoot up on bed  Transfers Overall transfer level: Needs assistance Equipment used: Rolling walker (2 wheeled) Transfers: Sit to/from Stand Sit to Stand: Min assist;Mod assist         General transfer comment: min to mod to power up from lower bed height  Ambulation/Gait Ambulation/Gait assistance: Min assist;Min guard Ambulation Distance (Feet): 60 Feet(30 x 2) Assistive device: Rolling  walker (2 wheeled) Gait Pattern/deviations: Step-through pattern;Decreased stride length;Trunk flexed;Wide base of support;Shuffle Gait velocity: Decreased Gait velocity interpretation: Below normal speed for age/gender General Gait Details: pt is weak and shaky on legs   Stairs            Wheelchair Mobility    Modified Rankin (Stroke Patients Only)       Balance Overall balance assessment: Needs assistance Sitting-balance support: Bilateral upper extremity supported;Feet supported Sitting balance-Leahy Scale: Fair     Standing balance support: Bilateral upper extremity supported;During functional activity Standing balance-Leahy Scale: Poor                              Cognition Arousal/Alertness: Lethargic Behavior During Therapy: WFL for tasks assessed/performed Overall Cognitive Status: History of cognitive impairments - at baseline                                 General Comments: Pt is struggling to remember safety information      Exercises      General Comments General comments (skin integrity, edema, etc.): sats were steady and pulses were up to 129 briefly then to 90's      Pertinent Vitals/Pain Pain Assessment: No/denies pain    Home Living                      Prior Function            PT Goals (current  goals can now be found in the care plan section) Acute Rehab PT Goals Patient Stated Goal: get home and feel better PT Goal Formulation: With patient Potential to Achieve Goals: Good Progress towards PT goals: Progressing toward goals    Frequency    Min 3X/week      PT Plan Current plan remains appropriate    Co-evaluation              AM-PAC PT "6 Clicks" Daily Activity  Outcome Measure  Difficulty turning over in bed (including adjusting bedclothes, sheets and blankets)?: A Little Difficulty moving from lying on back to sitting on the side of the bed? : A Little Difficulty sitting  down on and standing up from a chair with arms (e.g., wheelchair, bedside commode, etc,.)?: A Lot Help needed moving to and from a bed to chair (including a wheelchair)?: A Lot Help needed walking in hospital room?: A Little Help needed climbing 3-5 steps with a railing? : A Little 6 Click Score: 16    End of Session Equipment Utilized During Treatment: Gait belt Activity Tolerance: Patient limited by fatigue;Patient limited by lethargy Patient left: in bed;with call bell/phone within reach;with bed alarm set;with nursing/sitter in room Nurse Communication: Mobility status PT Visit Diagnosis: Unsteadiness on feet (R26.81);Muscle weakness (generalized) (M62.81);Difficulty in walking, not elsewhere classified (R26.2);Other abnormalities of gait and mobility (R26.89)     Time: 7169-6789 PT Time Calculation (min) (ACUTE ONLY): 29 min  Charges:  $Gait Training: 8-22 mins $Therapeutic Activity: 8-22 mins                    G Codes:  Functional Assessment Tool Used: AM-PAC 6 Clicks Basic Mobility }   Ramond Dial 10/07/2017, 8:48 PM   Mee Hives, PT MS Acute Rehab Dept. Number: [place] [facility] [place]

## 2017-10-08 LAB — BASIC METABOLIC PANEL
Anion gap: 7 (ref 5–15)
BUN: 57 mg/dL — AB (ref 6–20)
CALCIUM: 7.6 mg/dL — AB (ref 8.9–10.3)
CO2: 19 mmol/L — ABNORMAL LOW (ref 22–32)
Chloride: 114 mmol/L — ABNORMAL HIGH (ref 101–111)
Creatinine, Ser: 2.53 mg/dL — ABNORMAL HIGH (ref 0.61–1.24)
GFR calc Af Amer: 25 mL/min — ABNORMAL LOW (ref >60)
GFR, EST NON AFRICAN AMERICAN: 21 mL/min — AB (ref >60)
Glucose, Bld: 94 mg/dL (ref 65–99)
Potassium: 4 mmol/L (ref 3.5–5.1)
SODIUM: 140 mmol/L (ref 135–145)

## 2017-10-08 LAB — CBC
HEMATOCRIT: 28.7 % — AB (ref 39.0–52.0)
HEMOGLOBIN: 9.2 g/dL — AB (ref 13.0–17.0)
MCH: 28.9 pg (ref 26.0–34.0)
MCHC: 32.1 g/dL (ref 30.0–36.0)
MCV: 90.3 fL (ref 78.0–100.0)
Platelets: 165 10*3/uL (ref 150–400)
RBC: 3.18 MIL/uL — AB (ref 4.22–5.81)
RDW: 14.9 % (ref 11.5–15.5)
WBC: 7.8 10*3/uL (ref 4.0–10.5)

## 2017-10-08 NOTE — Care Management Important Message (Signed)
Important Message  Patient Details  Name: Bradley [name] MRN: [id number] Date of Birth: 03/02/1928   Medicare Important Message Given:  Yes    Carles Collet, RN 10/08/2017, 2:25 PM

## 2017-10-08 NOTE — Progress Notes (Signed)
[facility] Progress Note  Bradley [name] 84 y.o. 01/21/1928  CC:  GI bleeding   Subjective:  patient resting comfortably in the bed. Denied any GI symptoms. Denied any bleeding episodes. Discussed with the nursing staff. No reported bleeding episodes.   ROS - patient denied chest pain and shortness of breath.  Objective: Vital signs in last 24 hours: Vitals:   10/08/17 0302 10/08/17 0353  BP: 124/75 121/74  Pulse: 82   Resp: 19   Temp:  98.4 F (36.9 C)  SpO2: 100%     Physical Exam:  Gen. Alert oriented 3. Not in acute distress Abdomen soft, nontender, nondistended, bowel sounds present. No peritoneal signs Lower extremity. Trace edema  Lab Results: Recent Labs    10/07/17 0630 10/08/17 0456  NA 141 140  K 3.9 4.0  CL 114* 114*  CO2 20* 19*  GLUCOSE 93 94  BUN 60* 57*  CREATININE 2.62* 2.53*  CALCIUM 7.6* 7.6*   No results for input(s): AST, ALT, ALKPHOS, BILITOT, PROT, ALBUMIN in the last 72 hours. Recent Labs    10/06/17 0308  10/07/17 0736 10/07/17 0833  WBC 11.1*  --   --  7.8  HGB 7.8*   < > 9.5* 9.4*  HCT 23.3*   < > 28.4* 28.0*  MCV 90.0  --   --  90.3  PLT 134*  --   --  122*   < > = values in this interval not displayed.   No results for input(s): LABPROT, INR in the last 72 hours.    Assessment/Plan: - Upper GI bleed. EGD on 10/04/2017 showed large duodenal bulb ulcer. Status post treatment with epinephrine injection. Underwent subsequent IR guided  embolization of GDA.  - acute blood loss anemia. Hemoglobin improved after blood transfusion. - NSTEMI - on medical management  Recommendations ------------------------ - CBC pending today. - If hemoglobin stable, advance diet to soft diet. - IV twice a day PPI, change to by mouth twice a day PPI on discharge. Continue twice a day PPI for 6-8 weeks. - Avoid NSAIDs. - Patient will need repeat EGD or upper GI series to document healing of large duodenal ulcer in 8 weeks. -  GI will follow  Otis Brace MD, [place] 10/08/2017, 8:32 AM  Contact #  [phone]

## 2017-10-08 NOTE — Progress Notes (Addendum)
PROGRESS NOTE    ADREN Bradley  JYN:[id number] DOB: [birth year] DOA: 10/03/2017 PCP: [name], MD   Brief Narrative: Bradley [name] is a 84 y.o. male with a history of hypertension, GERD, pacemaker placement, CKD stage IV, BPH, CHF with EF 30%, CAD. He presented with weakness and GI bleeding. Patient with frequent hematochezia. GI consulted for evaluation and management. Troponin elevated with EKG changes. Cardiology consulted and recommending medical management (not a candidate for cath)    Assessment & Plan:   Principal Problem:   GIB (gastrointestinal bleeding) Active Problems:   GERD (gastroesophageal reflux disease)   Osteoarthritis of both knees   Chronic kidney disease, stage IV (severe) (HCC)   Chronic combined systolic (congestive) and diastolic (congestive) heart failure (HCC)   CAD in native artery   Symptomatic bradycardia   Benign prostatic hyperplasia   Status post placement of cardiac pacemaker   Abdominal pain   Symptomatic anemia   Protein-calorie malnutrition, moderate (HCC)   NSTEMI (non-ST elevated myocardial infarction) ([place])   GI bleed Recurrent bleeding overnight requiring another unit of blood. S/p GDA coil embolization. No recurrent bleeding so far. Had hemoglobin down to hemoglobin of 6.8 that responded well to 2 units of PRBC (3/10) up to 9.4, now stable at 9.2. -GI recommendations: Soft diet -PPI -Transfuse for symptoms or hemoglobin <8 -PT eval: initially recommending HH, but state he made need SNF  Acute blood loss anemia Secondary to GI bleeding from ulcer. Stable hemoglobin. -Transfusions/management as above  Frequent falls Likely secondary to blood loss. CT negative. Recently diagnosed with UTI. Completed course.  NSTEMI Episode of severe chest pain on 3/9 with significant elevation of troponin overnight. Restarted Imdur. Cardiology reconsulted on 3/9 and is continuing medical management. Peak troponin of 21.76. Cardiology  started Coreg. Not a candidate for cath secondary to age and comorbidities. Troponin trended down. -Cardiology recommendations: continue medical management  GERD Stable -Continue protonix  Acute on chronic CKD 4 Baseline creatinine of about 3-3.2. Secondary to hypovolemia from acute blood loss. Resolved. Continues to improve below baseline.  CAD Stable. Chest pain episode on 3/9. Troponin elevated as mentioned above. Aspirin held initially secondary to GI bleeding. No aspirin or Plavix on discharge.  Essential hypertension -Restarted Imdur and Coreg  Hyperkalemia Resolved.  Chronic combined systolic and diastolic heart failure EF of 25-30% with diffuse hypokinesis and inferior akinesis. Euvolemic.  Leukocytosis No evidence of infection. Afebrile. Trending down.  Metabolic acidosis In setting of recent AKI. Improving.  Hypocalcemia Adjusted calcium of 8.5 in setting of hypoalbuminemia and chronic kidney disease.   DVT prophylaxis: SCDs Code Status:   Code Status: Full Code Family Communication: None at bedside Disposition Plan: Discharge pending PT eval   Consultants:   Gastroenterology  Procedures:   PRBC, 2 units (3/7)  PRBC, 2 units (3/8)  PRBC, 2 units (3/10)  Antimicrobials:  None    Subjective: No recurrent chest pain. Making jokes. Good mood  Objective: Vitals:   10/08/17 0353 10/08/17 0638 10/08/17 0911 10/08/17 1000  BP: 121/74  131/77   Pulse:   89   Resp:      Temp: 98.4 F (36.9 C)   98 F (36.7 C)  TempSrc: Oral     SpO2:      Weight:  87.4 kg (192 lb 10.9 oz)    Height:        Intake/Output Summary (Last 24 hours) at 10/08/2017 1132 Last data filed at 10/08/2017 0609 Gross per 24 hour  Intake -  Output 810 ml  Net -810 ml   Filed Weights   10/06/17 0500 10/07/17 0500 10/08/17 0638  Weight: 84 kg (185 lb 3 oz) 84.7 kg (186 lb 11.7 oz) 87.4 kg (192 lb 10.9 oz)    Examination:  General exam: Appears calm and  comfortable HEENT: strabismus Respiratory: Clear to auscultation bilaterally. Unlabored work of breathing. No wheezing or rales. Cardiovascular: Regular rate and rhythm. Normal S1 and S2. No heart murmurs present. No extra heart sounds Gastrointestinal system: Abdomen is nondistended, soft and tender in epigastrium. No organomegaly or masses felt. Central nervous system: Alert and oriented to person, place and time. Extremities: No edema. No calf tenderness Skin: No cyanosis. No rashes Psychiatry: Judgement and insight appear normal. Flat affect    Data Reviewed: I have personally reviewed following labs and imaging studies  CBC: Recent Labs  Lab 10/03/17 1243  10/04/17 1932 10/05/17 0825 10/06/17 0308 10/06/17 1422 10/07/17 0736 10/07/17 0833 10/08/17 0456  WBC 11.4*   < > 16.8* 15.6* 11.1*  --   --  7.8 7.8  NEUTROABS 9.5*  --   --   --   --   --   --   --   --   HGB 7.2*   < > 9.4* 8.6* 7.8* 6.8* 9.5* 9.4* 9.2*  HCT 22.9*   < > 28.6* 26.4* 23.3* 20.9* 28.4* 28.0* 28.7*  MCV 89.8   < > 88.8 89.5 90.0  --   --  90.3 90.3  PLT 203   < > 130* 126* 134*  --   --  122* 165   < > = values in this interval not displayed.   Basic Metabolic Panel: Recent Labs  Lab 10/04/17 0533  10/05/17 0825 10/05/17 1455 10/06/17 0308 10/07/17 0630 10/08/17 0456  NA 141  --  144  --  142 141 140  K 5.9*   < > 5.6* 5.1 4.5 3.9 4.0  CL 116*  --  118*  --  119* 114* 114*  CO2 15*  --  15*  --  17* 20* 19*  GLUCOSE 128*  --  121*  --  104* 93 94  BUN 108*  --  89*  --  77* 60* 57*  CREATININE 3.26*  --  2.94*  --  2.71* 2.62* 2.53*  CALCIUM 7.6*  --  7.9*  --  7.7* 7.6* 7.6*   < > = values in this interval not displayed.   GFR: Estimated Creatinine Clearance: 23.2 mL/min (A) (by C-G formula based on SCr of 2.53 mg/dL (H)). Liver Function Tests: Recent Labs  Lab 10/03/17 1243 10/04/17 0533  AST 18 24  ALT 17 15*  ALKPHOS 43 33*  BILITOT 0.6 1.2  PROT 5.2* 4.5*  ALBUMIN 2.9* 2.4*    No results for input(s): LIPASE, AMYLASE in the last 168 hours. No results for input(s): AMMONIA in the last 168 hours. Coagulation Profile: Recent Labs  Lab 10/03/17 1243  INR 1.20   Cardiac Enzymes: Recent Labs  Lab 10/05/17 1455 10/05/17 2016 10/06/17 0308 10/06/17 0915 10/07/17 0630  TROPONINI 4.97* 11.05* 20.54* 21.76* 12.14*   BNP (last 3 results) No results for input(s): PROBNP in the last 8760 hours. HbA1C: No results for input(s): HGBA1C in the last 72 hours. CBG: No results for input(s): GLUCAP in the last 168 hours. Lipid Profile: No results for input(s): CHOL, HDL, LDLCALC, TRIG, CHOLHDL, LDLDIRECT in the last 72 hours. Thyroid Function Tests: No results for input(s): TSH, T4TOTAL, FREET4,  T3FREE, THYROIDAB in the last 72 hours. Anemia Panel: No results for input(s): VITAMINB12, FOLATE, FERRITIN, TIBC, IRON, RETICCTPCT in the last 72 hours. Sepsis Labs: Recent Labs  Lab 10/03/17 1257  LATICACIDVEN 1.62    Recent Results (from the past 240 hour(s))  MRSA PCR Screening     Status: Abnormal   Collection Time: 10/03/17  4:58 PM  Result Value Ref Range Status   MRSA by PCR POSITIVE (A) NEGATIVE Final    Comment:        The GeneXpert MRSA Assay (FDA approved for NASAL specimens only), is one component of a comprehensive MRSA colonization surveillance program. It is not intended to diagnose MRSA infection nor to guide or monitor treatment for MRSA infections. RESULT CALLED TO, READ BACK BY AND VERIFIED WITH: O.OLFIN,RN AT 2152 BY L.PITT 10/03/17          Radiology Studies: No results found.      Scheduled Meds: . carvedilol  3.125 mg Oral BID WC  . cholecalciferol  1,000 Units Oral BID  . dorzolamide  1 drop Both Eyes BID   And  . timolol  1 drop Both Eyes BID  . isosorbide mononitrate  60 mg Oral Daily  . latanoprost  1 drop Both Eyes QHS  . pantoprazole  40 mg Intravenous Q12H   Continuous Infusions:    LOS: 5 days      Cordelia Poche, MD [facility] 10/08/2017, 11:32 AM Pager: [phone]  If 7PM-7AM, please contact night-coverage [email or web address] Password [facility] 10/08/2017, 11:32 AM

## 2017-10-09 LAB — BASIC METABOLIC PANEL
Anion gap: 8 (ref 5–15)
BUN: 47 mg/dL — ABNORMAL HIGH (ref 6–20)
CHLORIDE: 112 mmol/L — AB (ref 101–111)
CO2: 21 mmol/L — ABNORMAL LOW (ref 22–32)
CREATININE: 2.36 mg/dL — AB (ref 0.61–1.24)
Calcium: 8 mg/dL — ABNORMAL LOW (ref 8.9–10.3)
GFR, EST AFRICAN AMERICAN: 27 mL/min — AB (ref >60)
GFR, EST NON AFRICAN AMERICAN: 23 mL/min — AB (ref >60)
Glucose, Bld: 91 mg/dL (ref 65–99)
POTASSIUM: 4 mmol/L (ref 3.5–5.1)
SODIUM: 141 mmol/L (ref 135–145)

## 2017-10-09 LAB — HEMOGLOBIN AND HEMATOCRIT, BLOOD
HEMATOCRIT: 29.2 % — AB (ref 39.0–52.0)
HEMOGLOBIN: 9.7 g/dL — AB (ref 13.0–17.0)

## 2017-10-09 NOTE — Progress Notes (Signed)
Patient requires PT note to state "SNF" for insurance to approve.  Percell Locus [place] LCSW [phone]

## 2017-10-09 NOTE — Clinical Social Work Note (Signed)
Clinical Social Work Assessment  Patient Details  Name: Bradley [name] MRN: [id number] Date of Birth: [birth year]  Date of referral:  10/09/17               Reason for consult:  Facility Placement                Permission sought to share information with:  Facility Sport and exercise psychologist, Family Supports Permission granted to share information::  Yes, Verbal Permission Granted  Name::     Oceanographer::  SNFs  Relationship::  Daughter  Contact Information:  [phone]  Housing/Transportation Living arrangements for the past 2 months:  Single Family Home Source of Information:  Spouse, Adult Children Patient Interpreter Needed:  None Criminal Activity/Legal Involvement Pertinent to Current Situation/Hospitalization:  No - Comment as needed Significant Relationships:  Adult Children, Spouse Lives with:  Spouse Do you feel safe going back to the place where you live?  Yes Need for family participation in patient care:  Yes (Comment)  Care giving concerns:  CSW received consult for possible SNF placement at time of discharge. CSW spoke with patient's daughter (caretaker) regarding PT recommendation of SNF placement at time of discharge. She reported that patient's spouse is currently unable to care for patient at their home given patient's current physical needs and fall risk and she is hard of hearing. Patient's daughter expressed understanding of PT recommendation and may be agreeable to SNF placement at time of discharge. Daughter is currently in [place] for rehab. CSW to continue to follow and assist with discharge planning needs.   Social Worker assessment / plan:  CSW spoke with patient's daughter concerning possibility of rehab at [facility] before returning home.  Employment status:  Retired Nurse, adult PT Recommendations:  Home with [place], [place] / Referral to community resources:  [place]  Patient/Family's Response to care:  Patient's daughter recognizes need for rehab before returning home and is agreeable to a SNF in [place]. Patient's daughter reported preference for [facility] so he can be there with her. CSW explained that [facility] does not have a bed at this time. She will contact CSW after speaking with her mother. CSW also let her know that we are waiting on PT to see how patient is doing today for insurance authorization.   Patient/Family's Understanding of and Emotional Response to Diagnosis, Current Treatment, and Prognosis:  Patient/family is realistic regarding therapy needs and expressed being hopeful for SNF placement. Patient's daughter expressed understanding of CSW role and discharge process as well as medical condition. No questions/concerns about plan or treatment.    Emotional Assessment Appearance:  Appears stated age Attitude/Demeanor/Rapport:  Gracious Affect (typically observed):  Accepting, Appropriate Orientation:  Oriented to Self, Oriented to Situation, Oriented to Place, Oriented to  Time Alcohol / Substance use:  Not Applicable Psych involvement (Current and /or in the community):  No (Comment)  Discharge Needs  Concerns to be addressed:  Care Coordination Readmission within the last 30 days:  No Current discharge risk:  None Barriers to Discharge:  Continued Medical Work up   [employer], [place] 10/09/2017, 2:08 PM

## 2017-10-09 NOTE — Progress Notes (Signed)
Occupational Therapy Treatment Patient Details Name: Bradley [name] MRN: [id number] DOB: 1928/07/10 Today's Date: 10/09/2017    History of present illness Bradley A Arledgeis a 84 y.o.malewith a history of hypertension, GERD, pacemaker placement, CKD stage IV, BPH, CHF with EF 30%, CAD. He presented with weakness and GI bleeding. Patient with frequent hematochezia. GI consulted for evaluation and management. Troponin elevated with EKG changes and found to have NSTEMI 3/9. Cardiology consulted and recommending medical management (not a candidate for cath).    OT comments  Pt requiring min to mod assist for OOB mobility and demonstrating dependence on B UE support for standing ADL. Pt with poor awareness of deficits and is a high fall risk. Updated d/c disposition to SNF for ST rehab.  Follow Up Recommendations  SNF;Supervision/Assistance - 24 hour    Equipment Recommendations       Recommendations for Other Services      Precautions / Restrictions Precautions Precautions: Fall Restrictions Weight Bearing Restrictions: No       Mobility Bed Mobility Overal bed mobility: Needs Assistance Bed Mobility: Supine to Sit;Sit to Supine     Supine to sit: Min guard Sit to supine: Min guard   General bed mobility comments: use of rail, HOB up, increased time and effort  Transfers Overall transfer level: Needs assistance Equipment used: Rolling walker (2 wheeled) Transfers: Sit to/from Stand Sit to Stand: Min assist;Mod assist         General transfer comment: min from elevated bed, mod from toilet, does not conform to directions for hand placement    Balance Overall balance assessment: Needs assistance   Sitting balance-Leahy Scale: Fair       Standing balance-Leahy Scale: Poor Standing balance comment: unable to release walker in static standing                           ADL either performed or assessed with clinical judgement   ADL Overall ADL's :  Needs assistance/impaired Eating/Feeding: Set up;Bed level   Grooming: Wash/dry hands;Wash/dry face;Sitting;Set up           Upper Body Dressing : Minimal assistance;Sitting   Lower Body Dressing: Moderate assistance;Sit to/from stand   Toilet Transfer: Minimal assistance;Ambulation;RW;BSC   Toileting- Clothing Manipulation and Hygiene: Moderate assistance;Sit to/from stand Toileting - Clothing Manipulation Details (indicate cue type and reason): decreased thoroughness     Functional mobility during ADLs: Minimal assistance;Rolling walker       Vision       Perception     Praxis      Cognition Arousal/Alertness: Awake/alert Behavior During Therapy: WFL for tasks assessed/performed Overall Cognitive Status: Impaired/Different from baseline Area of Impairment: Memory;Safety/judgement;Problem solving;Orientation                 Orientation Level: Disoriented to;Place;Time;Situation   Memory: Decreased short-term memory   Safety/Judgement: Decreased awareness of safety;Decreased awareness of deficits   Problem Solving: Slow processing;Decreased initiation;Difficulty sequencing          Exercises     Shoulder Instructions       General Comments      Pertinent Vitals/ Pain       Pain Assessment: No/denies pain  Home Living                                          Prior Functioning/Environment  Frequency  Min 2X/week        Progress Toward Goals  OT Goals(current goals can now be found in the care plan section)  Progress towards OT goals: Progressing toward goals  Acute Rehab OT Goals Patient Stated Goal: get home and feel better OT Goal Formulation: With patient Time For Goal Achievement: 10/21/17 Potential to Achieve Goals: Good  Plan Discharge plan needs to be updated    Co-evaluation                 AM-PAC PT "6 Clicks" Daily Activity     Outcome Measure   Help from another person  eating meals?: None Help from another person taking care of personal grooming?: A Little Help from another person toileting, which includes using toliet, bedpan, or urinal?: A Lot Help from another person bathing (including washing, rinsing, drying)?: A Lot Help from another person to put on and taking off regular upper body clothing?: A Little Help from another person to put on and taking off regular lower body clothing?: A Lot 6 Click Score: 16    End of Session Equipment Utilized During Treatment: Gait belt;Rolling walker  OT Visit Diagnosis: Unsteadiness on feet (R26.81);History of falling (Z91.81);Other abnormalities of gait and mobility (R26.89)   Activity Tolerance Patient limited by fatigue   Patient Left in bed;with call bell/phone within reach;with bed alarm set   Nurse Communication (need rehab)        Time: 1351-1410 OT Time Calculation (min): 19 min  Charges: OT General Charges $OT Visit: 1 Visit OT Treatments $Self Care/Home Management : 8-22 mins  10/09/2017 Nestor Lewandowsky, OTR/L Pager: [phone] Werner Lean [place] 10/09/2017, 3:27 PM

## 2017-10-09 NOTE — Progress Notes (Signed)
PROGRESS NOTE                                                                                                                                                                                                             Patient Demographics:    Bradley [name], is a 84 y.o. male, DOB - [birth year], ZJI:[id number]  Admit date - 10/03/2017   Admitting Physician Lady Deutscher, MD  Outpatient Primary MD for the patient is [name], MD  LOS - 6  Outpatient Specialists: Cardiology (Dr. Tamala Julian)  Chief Complaint  Patient presents with  . Fall       Brief Narrative   84 year old male with history of hypertension, chronic CHF status post pacemaker, GERD, CKD stage IV, BPH and coronary artery disease presented with weakness and frequent hematochezia.  Patient was also found to have elevated troponin with EKG changes.  I consulted and patient underwent EGD with finding of large duodenal bulb ulcer underwent epinephrine injection and subsequent IR guided embolization of GDA.    Subjective:   Denies any chest pain, shortness of breath or dizziness.   Assessment  & Plan :   Principal problem Upper GI bleed secondary to duodenal bulb ulcer.   Underwent EGD with epinephrine injection and coil embolization of the GDA by IR.  No further bleeding and H&H improved (from 6.8 on admission to 9.7 today).  Received total 2 unit PRBC. Diet advance.  GI recommends PPI twice daily with follow-up [facility] ( Dr Therisa Doyne) in about 8 weeks to ensure healing of the duodenal ulcer. Avoid NSAIDs.  Acute blood loss anemia Secondary to bleeding duodenal ulcer.  H&H stable posttransfusion.  Non-ST elevation MI Chest pain with elevated troponin on 3/9.  Cardiology consult appreciated and recommended medical management.  Troponin peaked at 21.  Started on Coreg and resumed Imdur.  Not a candidate for cardiac cath secondary to comorbidities and advanced  age.  Cardiology recommended optimal medical management.  GERD Now on twice daily PPI  Acute on chronic kidney disease stage IV Baseline creatinine around 2.8-3. Worsened with dehydration and GIB. Now improved.  generalized weakness  OT recommends SNF. family agree. SW consulted.  Code Status : Full code  Family Communication  : Daughter updated on the phone  Disposition Plan  : SNF once bed available (possibly tomorrow)  Barriers  For Discharge : Awaiting PT evaluation and SNF placement  Consults  :  [facility] cardiology  Procedures  : EGD Coiling of GDA  DVT Prophylaxis  :  SCD  Lab Results  Component Value Date   PLT 165 10/08/2017    Antibiotics  :  Anti-infectives (From admission, onward)   Start     Dose/Rate Route Frequency Ordered Stop   10/03/17 2115  cefUROXime (CEFTIN) tablet 250 mg  Status:  Discontinued     250 mg Oral Daily with supper 10/03/17 2109 10/04/17 1639   10/03/17 1700  cefUROXime (CEFTIN) tablet 250 mg  Status:  Discontinued     250 mg Oral 2 times daily with meals 10/03/17 1458 10/03/17 2109        Objective:   Vitals:   10/09/17 0111 10/09/17 0517 10/09/17 0923 10/09/17 1220  BP: 117/77 118/81 (!) 144/90 127/80  Pulse: 71 80 (!) 103 82  Resp: (!) 24 19  (!) 22  Temp:  98.4 F (36.9 C)  97.9 F (36.6 C)  TempSrc:  Oral  Oral  SpO2: 100% 100%  100%  Weight:  86.9 kg (191 lb 9.3 oz)    Height:        Wt Readings from Last 3 Encounters:  10/09/17 86.9 kg (191 lb 9.3 oz)  09/24/17 81.7 kg (180 lb 3.2 oz)  03/19/17 88.4 kg (194 lb 12.8 oz)     Intake/Output Summary (Last 24 hours) at 10/09/2017 1543 Last data filed at 10/08/2017 1919 Gross per 24 hour  Intake -  Output 200 ml  Net -200 ml     Physical Exam  Gen: not in distress HEENT: pallor+, moist mucosa, supple neck Chest: clear b/l, no added sounds CVS: N S1&S2, no murmurs, GI: soft, NT, ND Musculoskeletal: warm, no edema     Data Review:    CBC Recent  Labs  Lab 10/03/17 1243  10/04/17 1932 10/05/17 0825 10/06/17 0308 10/06/17 1422 10/07/17 0736 10/07/17 0833 10/08/17 0456 10/09/17 0728  WBC 11.4*   < > 16.8* 15.6* 11.1*  --   --  7.8 7.8  --   HGB 7.2*   < > 9.4* 8.6* 7.8* 6.8* 9.5* 9.4* 9.2* 9.7*  HCT 22.9*   < > 28.6* 26.4* 23.3* 20.9* 28.4* 28.0* 28.7* 29.2*  PLT 203   < > 130* 126* 134*  --   --  122* 165  --   MCV 89.8   < > 88.8 89.5 90.0  --   --  90.3 90.3  --   MCH 28.2   < > 29.2 29.2 30.1  --   --  30.3 28.9  --   MCHC 31.4   < > 32.9 32.6 33.5  --   --  33.6 32.1  --   RDW 13.4   < > 14.1 14.7 15.2  --   --  14.9 14.9  --   LYMPHSABS 1.6  --   --   --   --   --   --   --   --   --   MONOABS 0.3  --   --   --   --   --   --   --   --   --   EOSABS 0.0  --   --   --   --   --   --   --   --   --   BASOSABS 0.0  --   --   --   --   --   --   --   --   --    < > =  values in this interval not displayed.    Chemistries  Recent Labs  Lab 10/03/17 1243 10/04/17 0533  10/05/17 0825 10/05/17 1455 10/06/17 0308 10/07/17 0630 10/08/17 0456 10/09/17 0435  NA 138 141  --  144  --  142 141 140 141  K 5.3* 5.9*   < > 5.6* 5.1 4.5 3.9 4.0 4.0  CL 110 116*  --  118*  --  119* 114* 114* 112*  CO2 18* 15*  --  15*  --  17* 20* 19* 21*  GLUCOSE 130* 128*  --  121*  --  104* 93 94 91  BUN 107* 108*  --  89*  --  77* 60* 57* 47*  CREATININE 3.62* 3.26*  --  2.94*  --  2.71* 2.62* 2.53* 2.36*  CALCIUM 7.9* 7.6*  --  7.9*  --  7.7* 7.6* 7.6* 8.0*  AST 18 24  --   --   --   --   --   --   --   ALT 17 15*  --   --   --   --   --   --   --   ALKPHOS 43 33*  --   --   --   --   --   --   --   BILITOT 0.6 1.2  --   --   --   --   --   --   --    < > = values in this interval not displayed.   ------------------------------------------------------------------------------------------------------------------ No results for input(s): CHOL, HDL, LDLCALC, TRIG, CHOLHDL, LDLDIRECT in the last 72 hours.  Lab Results  Component Value  Date   HGBA1C 5.6 05/12/2015   ------------------------------------------------------------------------------------------------------------------ No results for input(s): TSH, T4TOTAL, T3FREE, THYROIDAB in the last 72 hours.  Invalid input(s): FREET3 ------------------------------------------------------------------------------------------------------------------ No results for input(s): VITAMINB12, FOLATE, FERRITIN, TIBC, IRON, RETICCTPCT in the last 72 hours.  Coagulation profile Recent Labs  Lab 10/03/17 1243  INR 1.20    No results for input(s): DDIMER in the last 72 hours.  Cardiac Enzymes Recent Labs  Lab 10/06/17 0308 10/06/17 0915 10/07/17 0630  TROPONINI 20.54* 21.76* 12.14*   ------------------------------------------------------------------------------------------------------------------    Component Value Date/Time   BNP 141.5 (H) 09/22/2017 2121    Inpatient Medications  Scheduled Meds: . carvedilol  3.125 mg Oral BID WC  . cholecalciferol  1,000 Units Oral BID  . dorzolamide  1 drop Both Eyes BID   And  . timolol  1 drop Both Eyes BID  . isosorbide mononitrate  60 mg Oral Daily  . latanoprost  1 drop Both Eyes QHS  . pantoprazole  40 mg Intravenous Q12H   Continuous Infusions: PRN Meds:.acetaminophen **OR** acetaminophen, bisacodyl, HYDROcodone-acetaminophen, morphine injection, nitroGLYCERIN, ondansetron **OR** ondansetron (ZOFRAN) IV, senna-docusate  Micro Results Recent Results (from the past 240 hour(s))  MRSA PCR Screening     Status: Abnormal   Collection Time: 10/03/17  4:58 PM  Result Value Ref Range Status   MRSA by PCR POSITIVE (A) NEGATIVE Final    Comment:        The GeneXpert MRSA Assay (FDA approved for NASAL specimens only), is one component of a comprehensive MRSA colonization surveillance program. It is not intended to diagnose MRSA infection nor to guide or monitor treatment for MRSA infections. RESULT CALLED TO, READ  BACK BY AND VERIFIED WITH: O.OLFIN,RN AT 2152 BY L.PITT 10/03/17     Radiology Reports Ct Abdomen Pelvis Wo Contrast  Result Date: 09/23/2017  CLINICAL DATA:  84 year old male with right lower quadrant abdominal pain. EXAM: CT ABDOMEN AND PELVIS WITHOUT CONTRAST TECHNIQUE: Multidetector CT imaging of the abdomen and pelvis was performed following the standard protocol without IV contrast. COMPARISON:  Renal ultrasound dated 09/27/2016 FINDINGS: Evaluation of this exam is limited in the absence of intravenous contrast. Lower chest: Minimal bibasilar linear atelectasis/scarring. The visualized lung bases are otherwise clear. There is coronary vascular calcification and cardiac pacemaker wire. No intra-abdominal free air or free fluid. Hepatobiliary: The liver is unremarkable. No intrahepatic biliary ductal dilatation. Small gallstones. No pericholecystic fluid or evidence of acute cholecystitis by CT. Pancreas: Unremarkable. No pancreatic ductal dilatation or surrounding inflammatory changes. Spleen: Normal in size without focal abnormality. Adrenals/Urinary Tract: Mild bilateral renal parenchyma atrophy. Small bilateral exophytic hypodense lesions are not characterized on this noncontrast CT. There is no hydronephrosis or nephrolithiasis on either side. The visualized ureters are unremarkable. There is trabeculation of the bladder wall with multiple diverticula measuring up to 5 cm from the left lateral bladder wall. Stomach/Bowel: There is no bowel obstruction or active inflammation. There are scattered sigmoid, chronic, and distal small bowel diverticula without active inflammatory changes. Normal appendix. Vascular/Lymphatic: Moderate aortoiliac atherosclerotic disease. No portal venous gas. There is no adenopathy. Reproductive: The prostate and seminal vesicles are grossly unremarkable. No pelvic mass Other: Small fat containing umbilical hernia. Musculoskeletal: Degenerative changes of the spine as well as  multilevel disc desiccation. No acute osseous pathology. IMPRESSION: 1. Cholelithiasis. 2. Colonic and distal small bowel diverticulosis. No bowel obstruction or active inflammation. Normal appendix. 3. Trabeculated urinary bladder with multiple large diverticula. Electronically Signed   By: Anner Crete M.D.   On: 09/23/2017 00:08   Ct Head Wo Contrast  Result Date: 10/03/2017 CLINICAL DATA:  Fall today.  Recent weakness.  Hypotensive. EXAM: CT HEAD WITHOUT CONTRAST TECHNIQUE: Contiguous axial images were obtained from the base of the skull through the vertex without intravenous contrast. COMPARISON:  09/22/2017 FINDINGS: Brain: There is no evidence of acute infarct, intracranial hemorrhage, mass, midline shift, or extra-axial fluid collection. There is mild cerebral atrophy. Patchy cerebral white matter hypodensities are unchanged and nonspecific but compatible with mild-to-moderate chronic small vessel ischemic disease. Vascular: Extensive calcified atherosclerosis at the skull base. No hyperdense vessel. Skull: No fracture or focal osseous lesion. Sinuses/Orbits: Visualized paranasal sinuses and mastoid air cells are clear. Visualized orbits are unremarkable. Other: None. IMPRESSION: 1. No evidence of acute intracranial abnormality. 2. Mild-to-moderate chronic small vessel ischemic disease. Electronically Signed   By: Logan Bores M.D.   On: 10/03/2017 13:43   Ct Head Wo Contrast  Result Date: 09/22/2017 CLINICAL DATA:  Syncope, nausea, vomiting EXAM: CT HEAD WITHOUT CONTRAST TECHNIQUE: Contiguous axial images were obtained from the base of the skull through the vertex without intravenous contrast. COMPARISON:  09/21/2014 FINDINGS: Brain: Age related volume loss. Mild chronic small vessel disease. No acute intracranial abnormality. Specifically, no hemorrhage, hydrocephalus, mass lesion, acute infarction, or significant intracranial injury. Vascular: No hyperdense vessel or unexpected calcification.  Skull: No acute calvarial abnormality. Sinuses/Orbits: Complete opacification of the right maxillary sinus with bulging of the medial wall, new since prior study. Mastoid air cells are clear. Orbital soft tissues unremarkable. Other: None IMPRESSION: No acute intracranial abnormality. Atrophy, chronic microvascular disease. Right maxillary sinus disease as above. Electronically Signed   By: Rolm Baptise M.D.   On: 09/22/2017 19:37   Ir Angiogram Visceral Selective  Result Date: 10/04/2017 CLINICAL DATA:  Upper GI bleed complicated by hypotension and  requiring blood transfusion. Endoscopy demonstrates large duodenal ulcer with adherent blood clot. Adequate clipping for hemostasis could not be achieved endoscopically. Empiric embolization of the supplying gastroduodenal artery is requested. Renal insufficiency. EXAM: ADDITIONAL ARTERIOGRAPHY; IR ULTRASOUND GUIDANCE VASC ACCESS RIGHT; SELECTIVE VISCERAL ARTERIOGRAPHY; IR EMBO ART VEN HEMORR LYMPH EXTRAV INC GUIDE ROADMAPPING ANESTHESIA/SEDATION: Intravenous Fentanyl and Versed were administered as conscious sedation during continuous monitoring of the patient's level of consciousness and physiological / cardiorespiratory status by the radiology RN, with a total moderate sedation time of 26 minutes. MEDICATIONS: Lidocaine 1% subcutaneous CONTRAST:  15 mL Isovue IA PROCEDURE: The procedure, risks (including but not limited to bleeding, infection, organ damage ), benefits, and alternatives were explained to the patient. Questions regarding the procedure were encouraged and answered. The patient understands and consents to the procedure. Right femoral region prepped and draped in usual sterile fashion. Maximal barrier sterile technique was utilized including caps, mask, sterile gowns, sterile gloves, sterile drape, hand hygiene and skin antiseptic. The right common femoral artery was demonstrated to be patent under ultrasound, and documentation stored. Under real-time  ultrasound guidance, the vessel was accessed with a 21-gauge micropuncture needle, exchanged over a 018 guidewire for a transitional dilator, through which a 035 guidewire was advanced. Over this, a 5 [place] vascular sheath was placed, through which a 5 [place] C2 catheter was advanced and used to selectively catheterize the celiac axis. Using an angled Glidewire, the catheter was advanced to the distal common hepatic artery. A coaxial Renegade microcatheter with angled Transcend guidewire was coaxially advanced into the gastroduodenal artery. After confirmatory arteriography, the artery was embolized with 5 mm interlock coils, until cessation of flow was achieved. After confirmatory arteriogram, the microcatheter and diagnostic catheter if and sheath were removed and hemostasis achieved with the aid of the Exoseal device after confirmatory femoral arteriography. The patient tolerated the procedure well. COMPLICATIONS: None immediate FINDINGS: No evidence of active extravasation on selective celiac arteriography, common hepatic arteriography, and gastroduodenal arteriography. However, given the findings on recent endoscopy, we proceeded with empiric embolization of the gastroduodenal artery as planned. No other arterial abnormalities were identified. IMPRESSION: 1. Technically successful empiric coil embolization of the gastroduodenal artery to control bleeding duodenal ulcer. Electronically Signed   By: Lucrezia Europe M.D.   On: 10/04/2017 14:36   Ir Angiogram Selective Each Additional Vessel  Result Date: 10/04/2017 CLINICAL DATA:  Upper GI bleed complicated by hypotension and requiring blood transfusion. Endoscopy demonstrates large duodenal ulcer with adherent blood clot. Adequate clipping for hemostasis could not be achieved endoscopically. Empiric embolization of the supplying gastroduodenal artery is requested. Renal insufficiency. EXAM: ADDITIONAL ARTERIOGRAPHY; IR ULTRASOUND GUIDANCE VASC ACCESS RIGHT;  SELECTIVE VISCERAL ARTERIOGRAPHY; IR EMBO ART VEN HEMORR LYMPH EXTRAV INC GUIDE ROADMAPPING ANESTHESIA/SEDATION: Intravenous Fentanyl and Versed were administered as conscious sedation during continuous monitoring of the patient's level of consciousness and physiological / cardiorespiratory status by the radiology RN, with a total moderate sedation time of 26 minutes. MEDICATIONS: Lidocaine 1% subcutaneous CONTRAST:  15 mL Isovue IA PROCEDURE: The procedure, risks (including but not limited to bleeding, infection, organ damage ), benefits, and alternatives were explained to the patient. Questions regarding the procedure were encouraged and answered. The patient understands and consents to the procedure. Right femoral region prepped and draped in usual sterile fashion. Maximal barrier sterile technique was utilized including caps, mask, sterile gowns, sterile gloves, sterile drape, hand hygiene and skin antiseptic. The right common femoral artery was demonstrated to be patent under ultrasound, and documentation stored.  Under real-time ultrasound guidance, the vessel was accessed with a 21-gauge micropuncture needle, exchanged over a 018 guidewire for a transitional dilator, through which a 035 guidewire was advanced. Over this, a 5 [place] vascular sheath was placed, through which a 5 [place] C2 catheter was advanced and used to selectively catheterize the celiac axis. Using an angled Glidewire, the catheter was advanced to the distal common hepatic artery. A coaxial Renegade microcatheter with angled Transcend guidewire was coaxially advanced into the gastroduodenal artery. After confirmatory arteriography, the artery was embolized with 5 mm interlock coils, until cessation of flow was achieved. After confirmatory arteriogram, the microcatheter and diagnostic catheter if and sheath were removed and hemostasis achieved with the aid of the Exoseal device after confirmatory femoral arteriography. The patient tolerated  the procedure well. COMPLICATIONS: None immediate FINDINGS: No evidence of active extravasation on selective celiac arteriography, common hepatic arteriography, and gastroduodenal arteriography. However, given the findings on recent endoscopy, we proceeded with empiric embolization of the gastroduodenal artery as planned. No other arterial abnormalities were identified. IMPRESSION: 1. Technically successful empiric coil embolization of the gastroduodenal artery to control bleeding duodenal ulcer. Electronically Signed   By: Lucrezia Europe M.D.   On: 10/04/2017 14:36   Ir US Guide Vasc Access Right  Result Date: 10/04/2017 CLINICAL DATA:  Upper GI bleed complicated by hypotension and requiring blood transfusion. Endoscopy demonstrates large duodenal ulcer with adherent blood clot. Adequate clipping for hemostasis could not be achieved endoscopically. Empiric embolization of the supplying gastroduodenal artery is requested. Renal insufficiency. EXAM: ADDITIONAL ARTERIOGRAPHY; IR ULTRASOUND GUIDANCE VASC ACCESS RIGHT; SELECTIVE VISCERAL ARTERIOGRAPHY; IR EMBO ART VEN HEMORR LYMPH EXTRAV INC GUIDE ROADMAPPING ANESTHESIA/SEDATION: Intravenous Fentanyl and Versed were administered as conscious sedation during continuous monitoring of the patient's level of consciousness and physiological / cardiorespiratory status by the radiology RN, with a total moderate sedation time of 26 minutes. MEDICATIONS: Lidocaine 1% subcutaneous CONTRAST:  15 mL Isovue IA PROCEDURE: The procedure, risks (including but not limited to bleeding, infection, organ damage ), benefits, and alternatives were explained to the patient. Questions regarding the procedure were encouraged and answered. The patient understands and consents to the procedure. Right femoral region prepped and draped in usual sterile fashion. Maximal barrier sterile technique was utilized including caps, mask, sterile gowns, sterile gloves, sterile drape, hand hygiene and skin  antiseptic. The right common femoral artery was demonstrated to be patent under ultrasound, and documentation stored. Under real-time ultrasound guidance, the vessel was accessed with a 21-gauge micropuncture needle, exchanged over a 018 guidewire for a transitional dilator, through which a 035 guidewire was advanced. Over this, a 5 [place] vascular sheath was placed, through which a 5 [place] C2 catheter was advanced and used to selectively catheterize the celiac axis. Using an angled Glidewire, the catheter was advanced to the distal common hepatic artery. A coaxial Renegade microcatheter with angled Transcend guidewire was coaxially advanced into the gastroduodenal artery. After confirmatory arteriography, the artery was embolized with 5 mm interlock coils, until cessation of flow was achieved. After confirmatory arteriogram, the microcatheter and diagnostic catheter if and sheath were removed and hemostasis achieved with the aid of the Exoseal device after confirmatory femoral arteriography. The patient tolerated the procedure well. COMPLICATIONS: None immediate FINDINGS: No evidence of active extravasation on selective celiac arteriography, common hepatic arteriography, and gastroduodenal arteriography. However, given the findings on recent endoscopy, we proceeded with empiric embolization of the gastroduodenal artery as planned. No other arterial abnormalities were identified. IMPRESSION: 1. Technically successful empiric coil  embolization of the gastroduodenal artery to control bleeding duodenal ulcer. Electronically Signed   By: Lucrezia Europe M.D.   On: 10/04/2017 14:36   Dg Chest Port 1 View  Result Date: 09/22/2017 CLINICAL DATA:  syncopal episode this afternoon. pt was out for 3 minutes. Pt has a demand pacemaker, per EMS pt had pacemaker placed d/t episodes of syncope in the past. Pt has hx of frequent PVCs.Pt Complains of epigastric discomfort. No SOBN EXAM: PORTABLE CHEST 1 VIEW COMPARISON:  05/14/2015  FINDINGS: Cardiac silhouette is normal in size. No mediastinal or hilar masses. No evidence of adenopathy. Left anterior chest wall sequential pacemaker is stable, leads projecting over the right atrium and right ventricle. Clear lungs. Stable elevation the right hemidiaphragm. No convincing pleural effusion. No pneumothorax. Skeletal structures are grossly intact. IMPRESSION: No acute cardiopulmonary disease. Electronically Signed   By: Lajean Manes M.D.   On: 09/22/2017 18:50   Sarah Ann Guide Roadmapping  Result Date: 10/04/2017 CLINICAL DATA:  Upper GI bleed complicated by hypotension and requiring blood transfusion. Endoscopy demonstrates large duodenal ulcer with adherent blood clot. Adequate clipping for hemostasis could not be achieved endoscopically. Empiric embolization of the supplying gastroduodenal artery is requested. Renal insufficiency. EXAM: ADDITIONAL ARTERIOGRAPHY; IR ULTRASOUND GUIDANCE VASC ACCESS RIGHT; SELECTIVE VISCERAL ARTERIOGRAPHY; IR EMBO ART VEN HEMORR LYMPH EXTRAV INC GUIDE ROADMAPPING ANESTHESIA/SEDATION: Intravenous Fentanyl and Versed were administered as conscious sedation during continuous monitoring of the patient's level of consciousness and physiological / cardiorespiratory status by the radiology RN, with a total moderate sedation time of 26 minutes. MEDICATIONS: Lidocaine 1% subcutaneous CONTRAST:  15 mL Isovue IA PROCEDURE: The procedure, risks (including but not limited to bleeding, infection, organ damage ), benefits, and alternatives were explained to the patient. Questions regarding the procedure were encouraged and answered. The patient understands and consents to the procedure. Right femoral region prepped and draped in usual sterile fashion. Maximal barrier sterile technique was utilized including caps, mask, sterile gowns, sterile gloves, sterile drape, hand hygiene and skin antiseptic. The right common femoral artery was  demonstrated to be patent under ultrasound, and documentation stored. Under real-time ultrasound guidance, the vessel was accessed with a 21-gauge micropuncture needle, exchanged over a 018 guidewire for a transitional dilator, through which a 035 guidewire was advanced. Over this, a 5 [place] vascular sheath was placed, through which a 5 [place] C2 catheter was advanced and used to selectively catheterize the celiac axis. Using an angled Glidewire, the catheter was advanced to the distal common hepatic artery. A coaxial Renegade microcatheter with angled Transcend guidewire was coaxially advanced into the gastroduodenal artery. After confirmatory arteriography, the artery was embolized with 5 mm interlock coils, until cessation of flow was achieved. After confirmatory arteriogram, the microcatheter and diagnostic catheter if and sheath were removed and hemostasis achieved with the aid of the Exoseal device after confirmatory femoral arteriography. The patient tolerated the procedure well. COMPLICATIONS: None immediate FINDINGS: No evidence of active extravasation on selective celiac arteriography, common hepatic arteriography, and gastroduodenal arteriography. However, given the findings on recent endoscopy, we proceeded with empiric embolization of the gastroduodenal artery as planned. No other arterial abnormalities were identified. IMPRESSION: 1. Technically successful empiric coil embolization of the gastroduodenal artery to control bleeding duodenal ulcer. Electronically Signed   By: Lucrezia Europe M.D.   On: 10/04/2017 14:36    Time Spent in minutes  25   Mehdi Gironda M.D on 10/09/2017 at 3:43 PM  Between 7am  to 7pm - Pager - [phone]  After 7pm go to [email or web address] - password [facility]  [facility] -  Office  [phone]

## 2017-10-09 NOTE — Progress Notes (Addendum)
[facility] Progress Note  Bradley [name] 84 y.o. Dec 09, 1927  CC:  GI bleeding   Subjective:  No acute Issues overnight. patient resting comfortably in the bed. Denied any GI symptoms. Denied any bleeding episodes.    ROS - patient denied chest pain and shortness of breath.  Objective: Vital signs in last 24 hours: Vitals:   10/09/17 0111 10/09/17 0517  BP: 117/77 118/81  Pulse: 71 80  Resp: (!) 24 19  Temp:  98.4 F (36.9 C)  SpO2: 100% 100%    Physical Exam:  Gen. Alert oriented 3. Not in acute distress Abdomen soft, nontender, nondistended, bowel sounds present. No peritoneal signs Lower extremity. Trace edema  Lab Results: Recent Labs    10/08/17 0456 10/09/17 0435  NA 140 141  K 4.0 4.0  CL 114* 112*  CO2 19* 21*  GLUCOSE 94 91  BUN 57* 47*  CREATININE 2.53* 2.36*  CALCIUM 7.6* 8.0*   No results for input(s): AST, ALT, ALKPHOS, BILITOT, PROT, ALBUMIN in the last 72 hours. Recent Labs    10/07/17 0833 10/08/17 0456  WBC 7.8 7.8  HGB 9.4* 9.2*  HCT 28.0* 28.7*  MCV 90.3 90.3  PLT 122* 165   No results for input(s): LABPROT, INR in the last 72 hours.    Assessment/Plan: - Upper GI bleed. EGD on 10/04/2017 showed large duodenal bulb ulcer. Status post treatment with epinephrine injection. Underwent subsequent IR guided  embolization of GDA.  - acute blood loss anemia. Hemoglobin improved after blood transfusion. - NSTEMI - on medical management  Recommendations ------------------------ - Hemoglobin is stable as of yesterday. H&H pending this morning -  Advance diet as tolerated. No further evidence of active GI bleed. - IV twice a day PPI, change to by mouth twice a day PPI on discharge. Continue twice a day PPI for 6-8 weeks. - Avoid NSAIDs. - Patient will need repeat EGD or upper GI series to document healing of large duodenal ulcer in 8 weeks. - Follow-up in GI clinic in 6 weeks with Dr. Therisa [name]  - GI will sign off. Call us  back if needed  Otis Brace MD, [place] 10/09/2017, 8:28 AM  Contact #  [phone]

## 2017-10-09 NOTE — Progress Notes (Signed)
10/09/17 1605  PT Visit Information  Last PT Received On 10/09/17  Assistance Needed +1  History of Present Illness Zaki A Arledgeis a 84 y.o.malewith a history of hypertension, GERD, pacemaker placement, CKD stage IV, BPH, CHF with EF 30%, CAD. He presented with weakness and GI bleeding. Patient with frequent hematochezia. GI consulted for evaluation and management. Troponin elevated with EKG changes and found to have NSTEMI 3/9. Cardiology consulted and recommending medical management (not a candidate for cath).   Subjective Data  Patient Stated Goal get home and feel better  Precautions  Precautions Fall  Restrictions  Weight Bearing Restrictions No  Pain Assessment  Pain Assessment No/denies pain  Cognition  Arousal/Alertness Awake/alert  Behavior During Therapy WFL for tasks assessed/performed  Overall Cognitive Status Impaired/Different from baseline  Area of Impairment Memory;Safety/judgement;Problem solving;Orientation  Orientation Level Disoriented to;Place;Time;Situation  Memory Decreased short-term memory  Safety/Judgement Decreased awareness of safety;Decreased awareness of deficits  Problem Solving Slow processing;Decreased initiation;Difficulty sequencing  Bed Mobility  Overal bed mobility Needs Assistance  Bed Mobility Supine to Sit;Sit to Supine  Supine to sit Min guard  Sit to supine Min guard  General bed mobility comments use of rail, HOB up, increased time and effort  Transfers  Overall transfer level Needs assistance  Equipment used Rolling walker (2 wheeled)  Transfers Sit to/from Stand  Sit to Stand Min assist;Mod assist;From elevated surface  General transfer comment min from elevated bed, mod from toilet, does not conform to directions for hand placement. PT required to brace RW as pt was pulling up on RW.   Ambulation/Gait  Ambulation/Gait assistance Min assist;Min guard  Ambulation Distance (Feet) 20 Feet  Assistive device Rolling walker (2  wheeled)  Gait Pattern/deviations Step-through pattern;Decreased stride length;Trunk flexed;Wide base of support;Shuffle  General Gait Details Slow, unsteady gait. Verbal cues for safe use of RW and for proximity to device. Also required cues for upright posture.   Gait velocity Decreased  Gait velocity interpretation Below normal speed for age/gender  Balance  Overall balance assessment Needs assistance  Sitting-balance support No upper extremity supported;Feet supported  Sitting balance-Leahy Scale Fair  Standing balance support Bilateral upper extremity supported;During functional activity  Standing balance-Leahy Scale Poor  Standing balance comment Reliant on RW for support.   General Comments  General comments (skin integrity, edema, etc.) Pt with dark stool following BM. Notified RN.   PT - End of Session  Equipment Utilized During Treatment Gait belt  Activity Tolerance Patient limited by fatigue  Patient left in bed;with call bell/phone within reach;with bed alarm set  Nurse Communication Mobility status  PT - Assessment/Plan  PT Plan Discharge plan needs to be updated;Frequency needs to be updated  PT Visit Diagnosis Unsteadiness on feet (R26.81);Muscle weakness (generalized) (M62.81);Difficulty in walking, not elsewhere classified (R26.2);Other abnormalities of gait and mobility (R26.89)  PT Frequency (ACUTE ONLY) Min 2X/week  Recommendations for Other Services OT consult  Follow Up Recommendations SNF;Supervision/Assistance - 24 hour  PT equipment None recommended by PT  AM-PAC PT "6 Clicks" Daily Activity Outcome Measure  Difficulty turning over in bed (including adjusting bedclothes, sheets and blankets)? 3  Difficulty moving from lying on back to sitting on the side of the bed?  3  Difficulty sitting down on and standing up from a chair with arms (e.g., wheelchair, bedside commode, etc,.)? 1  Help needed moving to and from a bed to chair (including a wheelchair)? 3  Help  needed walking in hospital room? 3  Help needed climbing  3-5 steps with a railing?  2  6 Click Score 15  Mobility G Code  CK  PT Goal Progression  Progress towards PT goals Progressing toward goals  Acute Rehab PT Goals  PT Goal Formulation With patient  Time For Goal Achievement 10/19/17  Potential to Achieve Goals Good  PT Time Calculation  PT Start Time (ACUTE ONLY) 1410  PT Stop Time (ACUTE ONLY) 1435  PT Time Calculation (min) (ACUTE ONLY) 25 min  PT General Charges  $$ ACUTE PT VISIT 1 Visit  PT Treatments  $Gait Training 8-22 mins  $Therapeutic Activity 8-22 mins   Pt with slow progression towards goals. Gait distance limited secondary to fatigue this session. Pt presenting with decreased safety during ambulation and unsteadiness and requiring min to mod A for mobility with RW. Feel pt is currently a very high fall risk and will need post acute PT prior to returning home. Discharge recommendations updated. Will continue to follow acutely to maximize functional mobility independence and safety.   Leighton Ruff, PT, DPT  Acute Rehabilitation Services  Pager: [phone]

## 2017-10-09 NOTE — NC FL2 (Signed)
[place] MEDICAID FL2 LEVEL OF CARE SCREENING TOOL     IDENTIFICATION  Patient Name: Bradley [name] Birthdate: 04-20-1928 Sex: male Admission Date (Current Location): 10/03/2017  [facility] and [place] Number:  Herbalist and Address:  [facility], [place] [address], [place],  [address]      Provider Number: [id number]  Attending Physician Name and Address:  Louellen Molder, MD  Relative Name and Phone Number:       Current Level of Care: Hospital Recommended Level of Care: [place] Prior Approval Number:    Date Approved/Denied:   PASRR Number:    Discharge Plan: SNF    Current Diagnoses: Patient Active Problem List   Diagnosis Date Noted  . NSTEMI (non-ST elevated myocardial infarction) ([place])   . GIB (gastrointestinal bleeding) 10/03/2017  . Abdominal pain 09/22/2017  . Symptomatic anemia 09/22/2017  . Protein-calorie malnutrition, moderate ([place]) 09/22/2017  . Symptomatic bradycardia   . Benign prostatic hyperplasia   . Syncope and collapse   . Hypertension   . Status post placement of cardiac pacemaker   . CAD in native artery 09/21/2014  . Chronic combined systolic (congestive) and diastolic (congestive) heart failure ([place]) 09/07/2014  . Chronic kidney disease, stage IV (severe) ([place]) 08/25/2014  . Acute lower UTI 09/13/2011  . Urethral stricture 09/13/2011  . GERD (gastroesophageal reflux disease) 09/13/2011  . Osteoarthritis of both knees 09/13/2011    Orientation RESPIRATION BLADDER Height & Weight     Self, Time, Situation, Place  Normal Continent Weight: 191 lb 9.3 oz (86.9 kg) Height:  6\' 1"  (185.4 cm)  BEHAVIORAL SYMPTOMS/MOOD NEUROLOGICAL BOWEL NUTRITION STATUS      Continent Diet(see DC Summary)  AMBULATORY STATUS COMMUNICATION OF NEEDS Skin   Limited Assist Verbally Normal                       Personal Care Assistance Level of Assistance  Bathing, Feeding, Dressing Bathing  Assistance: Limited assistance   Dressing Assistance: Limited assistance     Functional Limitations Info  Sight, Hearing, Speech Sight Info: Adequate Hearing Info: Adequate Speech Info: Adequate    SPECIAL CARE FACTORS FREQUENCY  PT (By licensed PT), OT (By licensed OT)     PT Frequency: 5/wk OT Frequency: 5/wk            Contractures      Additional Factors Info  Code Status, Allergies Code Status Info: FULL Allergies Info: NKA           Current Medications (10/09/2017):  This is the current hospital active medication list Current Facility-Administered Medications  Medication Dose Route Frequency Provider Last Rate Last Dose  . acetaminophen (TYLENOL) tablet 650 mg  650 mg Oral Q6H PRN Rondel Jumbo, PA-C   650 mg at 10/05/17 0205   Or  . acetaminophen (TYLENOL) suppository 650 mg  650 mg Rectal Q6H PRN Rondel Jumbo, PA-C      . bisacodyl (DULCOLAX) suppository 10 mg  10 mg Rectal Daily PRN Rondel Jumbo, PA-C      . carvedilol (COREG) tablet 3.125 mg  3.125 mg Oral BID WC Minus Breeding, MD   3.125 mg at 10/09/17 0924  . cholecalciferol (VITAMIN D) tablet 1,000 Units  1,000 Units Oral BID Rondel Jumbo, PA-C   1,000 Units at 10/09/17 6948  . dorzolamide (TRUSOPT) 2 % ophthalmic solution 1 drop  1 drop Both Eyes BID Lady Deutscher, MD  1 drop at 10/09/17 0917   And  . timolol (TIMOPTIC) 0.5 % ophthalmic solution 1 drop  1 drop Both Eyes BID Lady Deutscher, MD   1 drop at 10/09/17 3825  . HYDROcodone-acetaminophen (NORCO/VICODIN) 5-325 MG per tablet 1-2 tablet  1-2 tablet Oral Q4H PRN Rondel Jumbo, PA-C      . isosorbide mononitrate (IMDUR) 24 hr tablet 60 mg  60 mg Oral Daily Mariel Aloe, MD   60 mg at 10/09/17 0924  . latanoprost (XALATAN) 0.005 % ophthalmic solution 1 drop  1 drop Both Eyes QHS Rondel Jumbo, PA-C   1 drop at 10/08/17 2343  . morphine 2 MG/ML injection 2 mg  2 mg Intravenous Q4H PRN Mariel Aloe, MD   2 mg at 10/05/17  1422  . nitroGLYCERIN (NITROSTAT) SL tablet 0.4 mg  0.4 mg Sublingual Q5 min PRN Mariel Aloe, MD      . ondansetron [facility]) tablet 4 mg  4 mg Oral Q6H PRN Rondel Jumbo, PA-C       Or  . ondansetron (ZOFRAN) injection 4 mg  4 mg Intravenous Q6H PRN Sharene Butters E, PA-C   4 mg at 10/03/17 1516  . pantoprazole (PROTONIX) injection 40 mg  40 mg Intravenous Q12H Ronnette Juniper, MD   40 mg at 10/09/17 0924  . senna-docusate (Senokot-S) tablet 1 tablet  1 tablet Oral QHS PRN Rondel Jumbo, PA-C         Discharge Medications: Please see discharge summary for a list of discharge medications.  Relevant Imaging Results:  Relevant Lab Results:   Additional Information SSN; [id number]  Jorge Ny, LCSW

## 2017-10-09 NOTE — Care Management Note (Addendum)
Case Management Note  Patient Details  Name: Bradley [name] MRN: [id number] Date of Birth: Apr 12, 1928  Subjective/Objective:    Presents with syncope and abdominal pain / GI bleed, hx of hypertension, GERD, varicose vein, PVC, pacemaker placement, CK-4, BPH, CHF, CAD. Resides with wife, [name]. Wife states daughter [name] is dad's caregiver.          [name] (Daughter) [name] (Spouse)     [phone]       PCP: [name]  Action/Plan: NCM made attempted call to daughter [name] to discuss transitional care needs , voice message left.  PT eval: initially recommending HH. PT to re evaluate today.... patient will likely need SNF/rehab. CM following for disposition needs.     Expected Discharge Date:              Expected Discharge Plan:  [place]  In-House Referral:  Clinical Social Work  Discharge planning Services  CM Consult  Post Acute Care Choice:    Choice offered to:  Patient  DME Arranged:    DME Agency:     HH Arranged:    [place] Agency:     Status of Service:  In process, will continue to follow  If discussed at Long Length of Stay Meetings, dates discussed:    Additional Comments:  Sharin Mons, RN 10/09/2017, 12:08 PM

## 2017-10-09 NOTE — Progress Notes (Signed)
Patient's daughter called CSW back. [facility] will not have beds until the weekend. She requests patient discharge to [facility] until a bed is available at [facility]. CSW alerted [facility] to start insurance auth if they are able to accept patient tomorow.   Percell Locus [place] LCSW [phone]

## 2017-10-10 DIAGNOSIS — D62 Acute posthemorrhagic anemia: Secondary | ICD-10-CM

## 2017-10-10 DIAGNOSIS — K260 Acute duodenal ulcer with hemorrhage: Secondary | ICD-10-CM | POA: Diagnosis present

## 2017-10-10 LAB — BASIC METABOLIC PANEL
ANION GAP: 7 (ref 5–15)
BUN: 43 mg/dL — ABNORMAL HIGH (ref 6–20)
CALCIUM: 7.7 mg/dL — AB (ref 8.9–10.3)
CO2: 21 mmol/L — ABNORMAL LOW (ref 22–32)
Chloride: 112 mmol/L — ABNORMAL HIGH (ref 101–111)
Creatinine, Ser: 2.47 mg/dL — ABNORMAL HIGH (ref 0.61–1.24)
GFR, EST AFRICAN AMERICAN: 25 mL/min — AB (ref >60)
GFR, EST NON AFRICAN AMERICAN: 22 mL/min — AB (ref >60)
GLUCOSE: 128 mg/dL — AB (ref 65–99)
Potassium: 3.6 mmol/L (ref 3.5–5.1)
SODIUM: 140 mmol/L (ref 135–145)

## 2017-10-10 LAB — CBC
HCT: 28.9 % — ABNORMAL LOW (ref 39.0–52.0)
Hemoglobin: 9.4 g/dL — ABNORMAL LOW (ref 13.0–17.0)
MCH: 29.7 pg (ref 26.0–34.0)
MCHC: 32.5 g/dL (ref 30.0–36.0)
MCV: 91.5 fL (ref 78.0–100.0)
Platelets: 189 10*3/uL (ref 150–400)
RBC: 3.16 MIL/uL — ABNORMAL LOW (ref 4.22–5.81)
RDW: 15 % (ref 11.5–15.5)
WBC: 7.7 10*3/uL (ref 4.0–10.5)

## 2017-10-10 MED ORDER — SENNOSIDES-DOCUSATE SODIUM 8.6-50 MG PO TABS: 1.0000 | ORAL_TABLET | Freq: Every evening | ORAL | 0 refills | Status: DC | PRN

## 2017-10-10 MED ORDER — PANTOPRAZOLE SODIUM 40 MG PO TBEC: 40.0000 mg | DELAYED_RELEASE_TABLET | Freq: Two times a day (BID) | ORAL | 0 refills | Status: AC

## 2017-10-10 MED ORDER — ASPIRIN EC 81 MG PO TBEC: 81.0000 mg | DELAYED_RELEASE_TABLET | Freq: Every day | ORAL | Status: AC

## 2017-10-10 NOTE — Progress Notes (Signed)
Patient will DC to: [facility] Anticipated DC date: 10/10/17 Family notified: Daughter Transport by: Corey Harold   Per MD patient ready for DC to [facility]. RN, patient, patient's family, and facility notified of DC. Discharge Summary sent to facility. RN given number for report [phone] Room 1205p). DC packet on chart. Ambulance transport requested for patient.   CSW signing off.  Cedric Fishman, LCSW Clinical Social Worker [phone]

## 2017-10-10 NOTE — Progress Notes (Signed)
Bradley [name] has had a bed open and is able to accept patient today. Daughter aware.  Percell Locus Lethia Donlon LCSW [phone]

## 2017-10-10 NOTE — Discharge Summary (Addendum)
Physician Discharge Summary  Bradley [name] FWY:[id number] DOB: 25-May-1928 DOA: 10/03/2017  PCP: [name], MD  Admit date: 10/03/2017 Discharge date: 10/10/2017  Admitted From: Home Disposition: Skilled nursing facility [facility])  Recommendations for Outpatient Follow-up:  #1 follow-up with MD at SNF in 1 week.  Please check H&H within 2-3 days.  Please resume baby aspirin if hemoglobin stable in 1 week. #2.  Follow-up with cardiologist (Dr. Tamala Julian) in 3 weeks. #3. Follow up with [facility] ( Dr Therisa Doyne) in 6 weeks.  Patient will need follow-up EGD or upper GI series to evaluate for healing of the duodenal ulcer.  Patient should be on Protonix twice daily until then.    Equipment/Devices: None (as per therapy at the facility)  Discharge Condition: Fair CODE STATUS: Full code Diet recommendation: Heart Healthy     Discharge Diagnoses:  Principal Problem:   Acute duodenal ulcer with bleeding   Active Problems:   NSTEMI (non-ST elevated myocardial infarction) ([place])   Acute blood loss anemia   Chronic combined systolic (congestive) and diastolic (congestive) heart failure (HCC)   GERD (gastroesophageal reflux disease)   Osteoarthritis of both knees   Chronic kidney disease, stage IV (severe) (HCC)   CAD in native artery   Benign prostatic hyperplasia   Status post placement of cardiac pacemaker   Abdominal pain   Symptomatic anemia   Protein-calorie malnutrition, moderate (HCC)  Brief narrative/history of presenting illness Please refer to admission H&P for details, in brief,84 year old male with history of hypertension, chronic CHF status post pacemaker, GERD, CKD stage IV, BPH and coronary artery disease presented with weakness and frequent hematochezia.  Patient was also found to have elevated troponin with EKG changes.  I consulted and patient underwent EGD with finding of large duodenal bulb ulcer underwent epinephrine injection and subsequent IR guided embolization of  gastroduodenal artery to control bleeding duodenal ulcer.  Hospital course  Principal problem Upper GI bleed secondary to duodenal bulb ulcer.   Underwent EGD with epinephrine injection and coil embolization of the gastroduodenal artery by IR.  No further bleeding and H&H improved (from 6.8 on admission to 9.4 today).  Received total 2 unit PRBC. Diet advance.  GI recommends PPI twice daily with follow-up [facility] ( Dr Therisa Doyne) in about 6 -8 weeks with plan on EGD versus upper GI series to ensure healing of the duodenal ulcer. Avoid NSAIDs. -Close monitoring of H&H at the facility.  If stable in 1 week , may resume baby aspirin.  Acute blood loss anemia Secondary to bleeding duodenal ulcer.  H&H stable posttransfusion.  Non-ST elevation MI Chest pain with elevated troponin on 3/9.  Cardiology consult appreciated and recommended medical management.  Troponin peaked at 21.    Resumed Coreg, Imdur and hydralazine.  Continue Lasix.  Not a candidate for cardiac cath secondary to comorbidities and advanced age.  Cardiology recommended optimal medical management.   Patient remained stable on telemetry.  Follow-up with his cardiologist in about 3 weeks.  Chronic systolic CHF EF of 27-74%.  Euvolemic.  Holding aspirin until H&H stable.  Continue beta-blocker, Imdur and hydralazine. Has ICD.  Needs I/O monitoring at the facility.  GERD Now on twice daily PPI  Acute on chronic kidney disease stage IV Baseline creatinine around 2.8-3. Worsened with dehydration and GIB. Now improved.  generalized weakness PT/OT recommends SNF. family agree.  Will be discharged today.  Protein calorie malnutrition, moderate   Family Communication  : Daughter updated on the phone  Disposition Plan  :  SNF     Consults  :  [facility] cardiology  Procedures  : EGD Coiling of GDA     Discharge Instructions   Allergies as of 10/10/2017   No Known Allergies     Medication List    TAKE  these medications   acetaminophen 500 MG tablet Commonly known as:  TYLENOL Take 500 mg by mouth at bedtime as needed for headache (pain).   aspirin EC 81 MG tablet Take 1 tablet (81 mg total) by mouth daily. Start taking on:  10/17/2017 ( if H&H stable) What changed:    additional instructions  These instructions start on 10/17/2017. If you are unsure what to do until then, ask your doctor or other care provider.   carvedilol 6.25 MG tablet Commonly known as:  COREG TAKE 1 TABLET (6.25 MG TOTAL) BY MOUTH 2 (TWO) TIMES DAILY.   cholecalciferol 1000 units tablet Commonly known as:  VITAMIN D Take 1,000 Units by mouth 2 (two) times daily.   docusate sodium 100 MG capsule Commonly known as:  COLACE Take 100 mg by mouth 2 (two) times daily as needed for mild constipation.   dorzolamide-timolol 22.3-6.8 MG/ML ophthalmic solution Commonly known as:  COSOPT Place 1 drop into both eyes 2 (two) times daily.   furosemide 40 MG tablet Commonly known as:  LASIX Take 1 tablet (40 mg total) by mouth daily.   hydrALAZINE 25 MG tablet Commonly known as:  APRESOLINE TAKE 1 TABLET (25 MG TOTAL) BY MOUTH 2 (TWO) TIMES DAILY.   isosorbide mononitrate 60 MG 24 hr tablet Commonly known as:  IMDUR Take 60 mg by mouth daily.   latanoprost 0.005 % ophthalmic solution Commonly known as:  XALATAN Place 1 drop into both eyes at bedtime.   multivitamin with minerals Tabs tablet Take 1 tablet by mouth daily.   nitroGLYCERIN 0.4 MG SL tablet Commonly known as:  NITROSTAT Place 0.4 mg under the tongue every 5 (five) minutes as needed for chest pain (max 3 doses within 15 minutes call 911).   pantoprazole 40 MG tablet Commonly known as:  PROTONIX Take 1 tablet (40 mg total) by mouth 2 (two) times daily.   senna-docusate 8.6-50 MG tablet Commonly known as:  Senokot-S Take 1 tablet by mouth at bedtime as needed for mild constipation.      Follow-up Information    Ronnette Juniper, MD. Schedule  an appointment as soon as possible for a visit in 6 week(s).   Specialty:  Gastroenterology Contact information: [address] [place] [address] [phone]        Belva Crome, MD. Schedule an appointment as soon as possible for a visit in 3 week(s).   Specialty:  Cardiology Contact information: [address] [place] [address] [phone]        MD at SNF in 1 week Follow up.          No Known Allergies   Procedures/Studies: Ct Abdomen Pelvis Wo Contrast  Result Date: 09/23/2017 CLINICAL DATA:  84 year old male with right lower quadrant abdominal pain. EXAM: CT ABDOMEN AND PELVIS WITHOUT CONTRAST TECHNIQUE: Multidetector CT imaging of the abdomen and pelvis was performed following the standard protocol without IV contrast. COMPARISON:  Renal ultrasound dated 09/27/2016 FINDINGS: Evaluation of this exam is limited in the absence of intravenous contrast. Lower chest: Minimal bibasilar linear atelectasis/scarring. The visualized lung bases are otherwise clear. There is coronary vascular calcification and cardiac pacemaker wire. No intra-abdominal free  air or free fluid. Hepatobiliary: The liver is unremarkable. No intrahepatic biliary ductal dilatation. Small gallstones. No pericholecystic fluid or evidence of acute cholecystitis by CT. Pancreas: Unremarkable. No pancreatic ductal dilatation or surrounding inflammatory changes. Spleen: Normal in size without focal abnormality. Adrenals/Urinary Tract: Mild bilateral renal parenchyma atrophy. Small bilateral exophytic hypodense lesions are not characterized on this noncontrast CT. There is no hydronephrosis or nephrolithiasis on either side. The visualized ureters are unremarkable. There is trabeculation of the bladder wall with multiple diverticula measuring up to 5 cm from the left lateral bladder wall. Stomach/Bowel: There is no bowel obstruction or active inflammation. There are scattered sigmoid,  chronic, and distal small bowel diverticula without active inflammatory changes. Normal appendix. Vascular/Lymphatic: Moderate aortoiliac atherosclerotic disease. No portal venous gas. There is no adenopathy. Reproductive: The prostate and seminal vesicles are grossly unremarkable. No pelvic mass Other: Small fat containing umbilical hernia. Musculoskeletal: Degenerative changes of the spine as well as multilevel disc desiccation. No acute osseous pathology. IMPRESSION: 1. Cholelithiasis. 2. Colonic and distal small bowel diverticulosis. No bowel obstruction or active inflammation. Normal appendix. 3. Trabeculated urinary bladder with multiple large diverticula. Electronically Signed   By: Anner Crete M.D.   On: 09/23/2017 00:08   Ct Head Wo Contrast  Result Date: 10/03/2017 CLINICAL DATA:  Fall today.  Recent weakness.  Hypotensive. EXAM: CT HEAD WITHOUT CONTRAST TECHNIQUE: Contiguous axial images were obtained from the base of the skull through the vertex without intravenous contrast. COMPARISON:  09/22/2017 FINDINGS: Brain: There is no evidence of acute infarct, intracranial hemorrhage, mass, midline shift, or extra-axial fluid collection. There is mild cerebral atrophy. Patchy cerebral white matter hypodensities are unchanged and nonspecific but compatible with mild-to-moderate chronic small vessel ischemic disease. Vascular: Extensive calcified atherosclerosis at the skull base. No hyperdense vessel. Skull: No fracture or focal osseous lesion. Sinuses/Orbits: Visualized paranasal sinuses and mastoid air cells are clear. Visualized orbits are unremarkable. Other: None. IMPRESSION: 1. No evidence of acute intracranial abnormality. 2. Mild-to-moderate chronic small vessel ischemic disease. Electronically Signed   By: Logan Bores M.D.   On: 10/03/2017 13:43   Ct Head Wo Contrast  Result Date: 09/22/2017 CLINICAL DATA:  Syncope, nausea, vomiting EXAM: CT HEAD WITHOUT CONTRAST TECHNIQUE: Contiguous axial  images were obtained from the base of the skull through the vertex without intravenous contrast. COMPARISON:  09/21/2014 FINDINGS: Brain: Age related volume loss. Mild chronic small vessel disease. No acute intracranial abnormality. Specifically, no hemorrhage, hydrocephalus, mass lesion, acute infarction, or significant intracranial injury. Vascular: No hyperdense vessel or unexpected calcification. Skull: No acute calvarial abnormality. Sinuses/Orbits: Complete opacification of the right maxillary sinus with bulging of the medial wall, new since prior study. Mastoid air cells are clear. Orbital soft tissues unremarkable. Other: None IMPRESSION: No acute intracranial abnormality. Atrophy, chronic microvascular disease. Right maxillary sinus disease as above. Electronically Signed   By: Rolm Baptise M.D.   On: 09/22/2017 19:37   Ir Angiogram Visceral Selective  Result Date: 10/04/2017 CLINICAL DATA:  Upper GI bleed complicated by hypotension and requiring blood transfusion. Endoscopy demonstrates large duodenal ulcer with adherent blood clot. Adequate clipping for hemostasis could not be achieved endoscopically. Empiric embolization of the supplying gastroduodenal artery is requested. Renal insufficiency. EXAM: ADDITIONAL ARTERIOGRAPHY; IR ULTRASOUND GUIDANCE VASC ACCESS RIGHT; SELECTIVE VISCERAL ARTERIOGRAPHY; IR EMBO ART VEN HEMORR LYMPH EXTRAV INC GUIDE ROADMAPPING ANESTHESIA/SEDATION: Intravenous Fentanyl and Versed were administered as conscious sedation during continuous monitoring of the patient's level of consciousness and physiological / cardiorespiratory status by the  radiology RN, with a total moderate sedation time of 26 minutes. MEDICATIONS: Lidocaine 1% subcutaneous CONTRAST:  15 mL Isovue IA PROCEDURE: The procedure, risks (including but not limited to bleeding, infection, organ damage ), benefits, and alternatives were explained to the patient. Questions regarding the procedure were encouraged and  answered. The patient understands and consents to the procedure. Right femoral region prepped and draped in usual sterile fashion. Maximal barrier sterile technique was utilized including caps, mask, sterile gowns, sterile gloves, sterile drape, hand hygiene and skin antiseptic. The right common femoral artery was demonstrated to be patent under ultrasound, and documentation stored. Under real-time ultrasound guidance, the vessel was accessed with a 21-gauge micropuncture needle, exchanged over a 018 guidewire for a transitional dilator, through which a 035 guidewire was advanced. Over this, a 5 [place] vascular sheath was placed, through which a 5 [place] C2 catheter was advanced and used to selectively catheterize the celiac axis. Using an angled Glidewire, the catheter was advanced to the distal common hepatic artery. A coaxial Renegade microcatheter with angled Transcend guidewire was coaxially advanced into the gastroduodenal artery. After confirmatory arteriography, the artery was embolized with 5 mm interlock coils, until cessation of flow was achieved. After confirmatory arteriogram, the microcatheter and diagnostic catheter if and sheath were removed and hemostasis achieved with the aid of the Exoseal device after confirmatory femoral arteriography. The patient tolerated the procedure well. COMPLICATIONS: None immediate FINDINGS: No evidence of active extravasation on selective celiac arteriography, common hepatic arteriography, and gastroduodenal arteriography. However, given the findings on recent endoscopy, we proceeded with empiric embolization of the gastroduodenal artery as planned. No other arterial abnormalities were identified. IMPRESSION: 1. Technically successful empiric coil embolization of the gastroduodenal artery to control bleeding duodenal ulcer. Electronically Signed   By: Lucrezia Europe M.D.   On: 10/04/2017 14:36   Ir Angiogram Selective Each Additional Vessel  Result Date:  10/04/2017 CLINICAL DATA:  Upper GI bleed complicated by hypotension and requiring blood transfusion. Endoscopy demonstrates large duodenal ulcer with adherent blood clot. Adequate clipping for hemostasis could not be achieved endoscopically. Empiric embolization of the supplying gastroduodenal artery is requested. Renal insufficiency. EXAM: ADDITIONAL ARTERIOGRAPHY; IR ULTRASOUND GUIDANCE VASC ACCESS RIGHT; SELECTIVE VISCERAL ARTERIOGRAPHY; IR EMBO ART VEN HEMORR LYMPH EXTRAV INC GUIDE ROADMAPPING ANESTHESIA/SEDATION: Intravenous Fentanyl and Versed were administered as conscious sedation during continuous monitoring of the patient's level of consciousness and physiological / cardiorespiratory status by the radiology RN, with a total moderate sedation time of 26 minutes. MEDICATIONS: Lidocaine 1% subcutaneous CONTRAST:  15 mL Isovue IA PROCEDURE: The procedure, risks (including but not limited to bleeding, infection, organ damage ), benefits, and alternatives were explained to the patient. Questions regarding the procedure were encouraged and answered. The patient understands and consents to the procedure. Right femoral region prepped and draped in usual sterile fashion. Maximal barrier sterile technique was utilized including caps, mask, sterile gowns, sterile gloves, sterile drape, hand hygiene and skin antiseptic. The right common femoral artery was demonstrated to be patent under ultrasound, and documentation stored. Under real-time ultrasound guidance, the vessel was accessed with a 21-gauge micropuncture needle, exchanged over a 018 guidewire for a transitional dilator, through which a 035 guidewire was advanced. Over this, a 5 [place] vascular sheath was placed, through which a 5 [place] C2 catheter was advanced and used to selectively catheterize the celiac axis. Using an angled Glidewire, the catheter was advanced to the distal common hepatic artery. A coaxial Renegade microcatheter with angled Transcend  guidewire was coaxially  advanced into the gastroduodenal artery. After confirmatory arteriography, the artery was embolized with 5 mm interlock coils, until cessation of flow was achieved. After confirmatory arteriogram, the microcatheter and diagnostic catheter if and sheath were removed and hemostasis achieved with the aid of the Exoseal device after confirmatory femoral arteriography. The patient tolerated the procedure well. COMPLICATIONS: None immediate FINDINGS: No evidence of active extravasation on selective celiac arteriography, common hepatic arteriography, and gastroduodenal arteriography. However, given the findings on recent endoscopy, we proceeded with empiric embolization of the gastroduodenal artery as planned. No other arterial abnormalities were identified. IMPRESSION: 1. Technically successful empiric coil embolization of the gastroduodenal artery to control bleeding duodenal ulcer. Electronically Signed   By: Lucrezia Europe M.D.   On: 10/04/2017 14:36   Ir US Guide Vasc Access Right  Result Date: 10/04/2017 CLINICAL DATA:  Upper GI bleed complicated by hypotension and requiring blood transfusion. Endoscopy demonstrates large duodenal ulcer with adherent blood clot. Adequate clipping for hemostasis could not be achieved endoscopically. Empiric embolization of the supplying gastroduodenal artery is requested. Renal insufficiency. EXAM: ADDITIONAL ARTERIOGRAPHY; IR ULTRASOUND GUIDANCE VASC ACCESS RIGHT; SELECTIVE VISCERAL ARTERIOGRAPHY; IR EMBO ART VEN HEMORR LYMPH EXTRAV INC GUIDE ROADMAPPING ANESTHESIA/SEDATION: Intravenous Fentanyl and Versed were administered as conscious sedation during continuous monitoring of the patient's level of consciousness and physiological / cardiorespiratory status by the radiology RN, with a total moderate sedation time of 26 minutes. MEDICATIONS: Lidocaine 1% subcutaneous CONTRAST:  15 mL Isovue IA PROCEDURE: The procedure, risks (including but not limited to  bleeding, infection, organ damage ), benefits, and alternatives were explained to the patient. Questions regarding the procedure were encouraged and answered. The patient understands and consents to the procedure. Right femoral region prepped and draped in usual sterile fashion. Maximal barrier sterile technique was utilized including caps, mask, sterile gowns, sterile gloves, sterile drape, hand hygiene and skin antiseptic. The right common femoral artery was demonstrated to be patent under ultrasound, and documentation stored. Under real-time ultrasound guidance, the vessel was accessed with a 21-gauge micropuncture needle, exchanged over a 018 guidewire for a transitional dilator, through which a 035 guidewire was advanced. Over this, a 5 [place] vascular sheath was placed, through which a 5 [place] C2 catheter was advanced and used to selectively catheterize the celiac axis. Using an angled Glidewire, the catheter was advanced to the distal common hepatic artery. A coaxial Renegade microcatheter with angled Transcend guidewire was coaxially advanced into the gastroduodenal artery. After confirmatory arteriography, the artery was embolized with 5 mm interlock coils, until cessation of flow was achieved. After confirmatory arteriogram, the microcatheter and diagnostic catheter if and sheath were removed and hemostasis achieved with the aid of the Exoseal device after confirmatory femoral arteriography. The patient tolerated the procedure well. COMPLICATIONS: None immediate FINDINGS: No evidence of active extravasation on selective celiac arteriography, common hepatic arteriography, and gastroduodenal arteriography. However, given the findings on recent endoscopy, we proceeded with empiric embolization of the gastroduodenal artery as planned. No other arterial abnormalities were identified. IMPRESSION: 1. Technically successful empiric coil embolization of the gastroduodenal artery to control bleeding duodenal ulcer.  Electronically Signed   By: Lucrezia Europe M.D.   On: 10/04/2017 14:36   Dg Chest Port 1 View  Result Date: 09/22/2017 CLINICAL DATA:  syncopal episode this afternoon. pt was out for 3 minutes. Pt has a demand pacemaker, per EMS pt had pacemaker placed d/t episodes of syncope in the past. Pt has hx of frequent PVCs.Pt Complains of epigastric discomfort. No SOBN EXAM:  PORTABLE CHEST 1 VIEW COMPARISON:  05/14/2015 FINDINGS: Cardiac silhouette is normal in size. No mediastinal or hilar masses. No evidence of adenopathy. Left anterior chest wall sequential pacemaker is stable, leads projecting over the right atrium and right ventricle. Clear lungs. Stable elevation the right hemidiaphragm. No convincing pleural effusion. No pneumothorax. Skeletal structures are grossly intact. IMPRESSION: No acute cardiopulmonary disease. Electronically Signed   By: Lajean Manes M.D.   On: 09/22/2017 18:50   [place] Guide Roadmapping  Result Date: 10/04/2017 CLINICAL DATA:  Upper GI bleed complicated by hypotension and requiring blood transfusion. Endoscopy demonstrates large duodenal ulcer with adherent blood clot. Adequate clipping for hemostasis could not be achieved endoscopically. Empiric embolization of the supplying gastroduodenal artery is requested. Renal insufficiency. EXAM: ADDITIONAL ARTERIOGRAPHY; IR ULTRASOUND GUIDANCE VASC ACCESS RIGHT; SELECTIVE VISCERAL ARTERIOGRAPHY; IR EMBO ART VEN HEMORR LYMPH EXTRAV INC GUIDE ROADMAPPING ANESTHESIA/SEDATION: Intravenous Fentanyl and Versed were administered as conscious sedation during continuous monitoring of the patient's level of consciousness and physiological / cardiorespiratory status by the radiology RN, with a total moderate sedation time of 26 minutes. MEDICATIONS: Lidocaine 1% subcutaneous CONTRAST:  15 mL Isovue IA PROCEDURE: The procedure, risks (including but not limited to bleeding, infection, organ damage ), benefits, and  alternatives were explained to the patient. Questions regarding the procedure were encouraged and answered. The patient understands and consents to the procedure. Right femoral region prepped and draped in usual sterile fashion. Maximal barrier sterile technique was utilized including caps, mask, sterile gowns, sterile gloves, sterile drape, hand hygiene and skin antiseptic. The right common femoral artery was demonstrated to be patent under ultrasound, and documentation stored. Under real-time ultrasound guidance, the vessel was accessed with a 21-gauge micropuncture needle, exchanged over a 018 guidewire for a transitional dilator, through which a 035 guidewire was advanced. Over this, a 5 [place] vascular sheath was placed, through which a 5 [place] C2 catheter was advanced and used to selectively catheterize the celiac axis. Using an angled Glidewire, the catheter was advanced to the distal common hepatic artery. A coaxial Renegade microcatheter with angled Transcend guidewire was coaxially advanced into the gastroduodenal artery. After confirmatory arteriography, the artery was embolized with 5 mm interlock coils, until cessation of flow was achieved. After confirmatory arteriogram, the microcatheter and diagnostic catheter if and sheath were removed and hemostasis achieved with the aid of the Exoseal device after confirmatory femoral arteriography. The patient tolerated the procedure well. COMPLICATIONS: None immediate FINDINGS: No evidence of active extravasation on selective celiac arteriography, common hepatic arteriography, and gastroduodenal arteriography. However, given the findings on recent endoscopy, we proceeded with empiric embolization of the gastroduodenal artery as planned. No other arterial abnormalities were identified. IMPRESSION: 1. Technically successful empiric coil embolization of the gastroduodenal artery to control bleeding duodenal ulcer. Electronically Signed   By: Lucrezia Europe M.D.   On:  10/04/2017 14:36       Subjective: No overnight events.  Denies any symptoms.  Discharge Exam: Vitals:   10/09/17 2229 10/10/17 0425  BP: 114/68 134/66  Pulse: 82 77  Resp: 16 18  Temp: 98 F (36.7 C) 98 F (36.7 C)  SpO2: 100% 100%   Vitals:   10/09/17 1220 10/09/17 1620 10/09/17 2229 10/10/17 0425  BP: 127/80 106/63 114/68 134/66  Pulse: 82 78 82 77  Resp: (!) 22 (!) 23 16 18   Temp: 97.9 F (36.6 C) 98.7 F (37.1 C) 98 F (36.7 C) 98 F (36.7 C)  TempSrc: Oral Oral Oral Oral  SpO2: 100% 100% 100% 100%  Weight:    86.5 kg (190 lb 11.2 oz)  Height:         Gen: not in distress HEENT: pallor+, moist mucosa, supple neck Chest: clear b/l, no added sounds CVS: N S1&S2, no murmurs, GI: soft, NT, ND Musculoskeletal: warm, no edema     The results of significant diagnostics from this hospitalization (including imaging, microbiology, ancillary and laboratory) are listed below for reference.     Microbiology: Recent Results (from the past 240 hour(s))  MRSA PCR Screening     Status: Abnormal   Collection Time: 10/03/17  4:58 PM  Result Value Ref Range Status   MRSA by PCR POSITIVE (A) NEGATIVE Final    Comment:        The GeneXpert MRSA Assay (FDA approved for NASAL specimens only), is one component of a comprehensive MRSA colonization surveillance program. It is not intended to diagnose MRSA infection nor to guide or monitor treatment for MRSA infections. RESULT CALLED TO, READ BACK BY AND VERIFIED WITH: O.OLFIN,RN AT 2152 BY L.PITT 10/03/17      Labs: BNP (last 3 results) Recent Labs    09/22/17 2121  BNP 009.3*   Basic Metabolic Panel: Recent Labs  Lab 10/06/17 0308 10/07/17 0630 10/08/17 0456 10/09/17 0435 10/10/17 0300  NA 142 141 140 141 140  K 4.5 3.9 4.0 4.0 3.6  CL 119* 114* 114* 112* 112*  CO2 17* 20* 19* 21* 21*  GLUCOSE 104* 93 94 91 128*  BUN 77* 60* 57* 47* 43*  CREATININE 2.71* 2.62* 2.53* 2.36* 2.47*  CALCIUM 7.7* 7.6*  7.6* 8.0* 7.7*   Liver Function Tests: Recent Labs  Lab 10/03/17 1243 10/04/17 0533  AST 18 24  ALT 17 15*  ALKPHOS 43 33*  BILITOT 0.6 1.2  PROT 5.2* 4.5*  ALBUMIN 2.9* 2.4*   No results for input(s): LIPASE, AMYLASE in the last 168 hours. No results for input(s): AMMONIA in the last 168 hours. CBC: Recent Labs  Lab 10/03/17 1243  10/05/17 0825 10/06/17 0308  10/07/17 0736 10/07/17 8182 10/08/17 0456 10/09/17 0728 10/10/17 0300  WBC 11.4*   < > 15.6* 11.1*  --   --  7.8 7.8  --  7.7  NEUTROABS 9.5*  --   --   --   --   --   --   --   --   --   HGB 7.2*   < > 8.6* 7.8*   < > 9.5* 9.4* 9.2* 9.7* 9.4*  HCT 22.9*   < > 26.4* 23.3*   < > 28.4* 28.0* 28.7* 29.2* 28.9*  MCV 89.8   < > 89.5 90.0  --   --  90.3 90.3  --  91.5  PLT 203   < > 126* 134*  --   --  122* 165  --  189   < > = values in this interval not displayed.   Cardiac Enzymes: Recent Labs  Lab 10/05/17 1455 10/05/17 2016 10/06/17 0308 10/06/17 0915 10/07/17 0630  TROPONINI 4.97* 11.05* 20.54* 21.76* 12.14*   BNP: Invalid input(s): POCBNP CBG: No results for input(s): GLUCAP in the last 168 hours. D-Dimer No results for input(s): DDIMER in the last 72 hours. Hgb A1c No results for input(s): HGBA1C in the last 72 hours. Lipid Profile No results for input(s): CHOL, HDL, LDLCALC, TRIG, CHOLHDL, LDLDIRECT in the last 72 hours. Thyroid function studies No results for input(s):  TSH, T4TOTAL, T3FREE, THYROIDAB in the last 72 hours.  Invalid input(s): FREET3 Anemia work up No results for input(s): VITAMINB12, FOLATE, FERRITIN, TIBC, IRON, RETICCTPCT in the last 72 hours. Urinalysis    Component Value Date/Time   COLORURINE YELLOW 09/22/2017 2150   APPEARANCEUR CLOUDY (A) 09/22/2017 2150   LABSPEC 1.010 09/22/2017 2150   PHURINE 6.0 09/22/2017 2150   GLUCOSEU NEGATIVE 09/22/2017 2150   HGBUR NEGATIVE 09/22/2017 2150   BILIRUBINUR NEGATIVE 09/22/2017 2150   KETONESUR NEGATIVE 09/22/2017 2150    PROTEINUR NEGATIVE 09/22/2017 2150   UROBILINOGEN 0.2 09/21/2014 1402   NITRITE NEGATIVE 09/22/2017 2150   LEUKOCYTESUR MODERATE (A) 09/22/2017 2150   Sepsis Labs Invalid input(s): PROCALCITONIN,  WBC,  LACTICIDVEN Microbiology Recent Results (from the past 240 hour(s))  MRSA PCR Screening     Status: Abnormal   Collection Time: 10/03/17  4:58 PM  Result Value Ref Range Status   MRSA by PCR POSITIVE (A) NEGATIVE Final    Comment:        The GeneXpert MRSA Assay (FDA approved for NASAL specimens only), is one component of a comprehensive MRSA colonization surveillance program. It is not intended to diagnose MRSA infection nor to guide or monitor treatment for MRSA infections. RESULT CALLED TO, READ BACK BY AND VERIFIED WITH: O.OLFIN,RN AT 2152 BY L.PITT 10/03/17      Time coordinating discharge: Over 30 minutes  SIGNED:   Louellen Molder, MD  [facility] 10/10/2017, 9:40 AM Pager   If 7PM-7AM, please contact night-coverage [email or web address] Password [email or web address]

## 2017-10-10 NOTE — Progress Notes (Signed)
Pt prepared for d/c to SNF. IV d/c'd. Skin intact except as charted in most recent assessments. Vitals are stable. Report called to receiving facility. Pt to be transported by ambulance service. 

## 2017-10-10 NOTE — Clinical Social Work Placement (Signed)
   CLINICAL SOCIAL WORK PLACEMENT  NOTE  Date:  10/10/2017  Patient Details  Name: Bradley [name] MRN: [id number] Date of Birth: 07/07/1928  Clinical Social Work is seeking post-discharge placement for this patient at the [place] level of care (*CSW will initial, date and re-position this form in  chart as items are completed):  Yes   Patient/family provided with [facility] Work Department's list of facilities offering this level of care within the geographic area requested by the patient (or if unable, by the patient's family).  Yes   Patient/family informed of their freedom to choose among providers that offer the needed level of care, that participate in Medicare, Medicaid or managed care program needed by the patient, have an available bed and are willing to accept the patient.  Yes   Patient/family informed of [facility]'s ownership interest in [facility] and [facility], as well as of the fact that they are under no obligation to receive care at these facilities.  PASRR submitted to EDS on       PASRR number received on       Existing PASRR number confirmed on 10/09/17     FL2 transmitted to all facilities in geographic area requested by pt/family on 10/09/17     FL2 transmitted to all facilities within larger geographic area on       Patient informed that his/her managed care company has contracts with or will negotiate with certain facilities, including the following:        Yes   Patient/family informed of bed offers received.  Patient chooses bed at [facility]     Physician recommends and patient chooses bed at      Patient to be transferred to [facility] on 10/10/17.  Patient to be transferred to facility by ptar     Patient family notified on 10/10/17 of transfer.  Name of family member notified:  [name]     PHYSICIAN Please sign FL2     Additional Comment:     _______________________________________________ Jorge Ny, LCSW 10/10/2017, 10:57 AM

## 2017-10-28 ENCOUNTER — Encounter: Payer: Self-pay | Admitting: Podiatry

## 2017-10-28 ENCOUNTER — Ambulatory Visit: Payer: Medicare Other | Admitting: Podiatry

## 2017-10-28 DIAGNOSIS — B351 Tinea unguium: Secondary | ICD-10-CM | POA: Diagnosis not present

## 2017-10-28 DIAGNOSIS — M79676 Pain in unspecified toe(s): Secondary | ICD-10-CM | POA: Diagnosis not present

## 2017-10-31 NOTE — Progress Notes (Signed)
   SUBJECTIVE Patient presents to office today complaining of elongated, thickened nails that cause pain while ambulating in shoes. He is unable to trim his own nails. Patient is here for further evaluation and treatment.  Past Medical History:  Diagnosis Date  . Arthritis   . BPH (benign prostatic hypertrophy)   . Cardiomyopathy ([place])    a. thought to be ischemic with high risk MPS EF 23%. Reluctant to do cath due to CKD  . Cataract   . Chronic kidney disease   . Dysrhythmia   . GERD (gastroesophageal reflux disease)   . Hypertension   . PPD positive    a. HX POSITIVE PPD WITH NEGATIVE CXR  . Status post placement of cardiac pacemaker    a. 04/2015: bradycardic arrest s/p Biotronik serial #[id number] pacemaker.  . Strabismus    a. right eye  . Symptomatic bradycardia    a. s/p Biotronik serial [id number] pacemaker.  . Syncope and collapse    a. in 2014 and again in 08/2014. Thought to be vasovagal   . Varicosities of leg     OBJECTIVE General Patient is awake, alert, and oriented x 3 and in no acute distress. Derm Skin is dry and supple bilateral. Negative open lesions or macerations. Remaining integument unremarkable. Nails are tender, long, thickened and dystrophic with subungual debris, consistent with onychomycosis, 1-5 bilateral. No signs of infection noted. Vasc  DP and PT pedal pulses palpable bilaterally. Temperature gradient within normal limits.  Neuro Epicritic and protective threshold sensation diminished bilaterally.  Musculoskeletal Exam No symptomatic pedal deformities noted bilateral. Muscular strength within normal limits.  ASSESSMENT 1. Onychodystrophic nails 1-5 bilateral with hyperkeratosis of nails.  2. Onychomycosis of nail due to dermatophyte bilateral 3. Pain in foot bilateral  PLAN OF CARE 1. Patient evaluated today.  2. Instructed to maintain good pedal hygiene and foot care.  3. Mechanical debridement of nails 1-5 bilaterally performed using a nail  nipper. Filed with dremel without incident.  4. Return to clinic in 3 mos.    Edrick Kins, DPM [facility]  Dr. Edrick Kins, [place]                                        [place], [place] [address]                Office [phone]  Fax [phone]

## 2017-11-05 ENCOUNTER — Encounter: Payer: Self-pay | Admitting: Physician Assistant

## 2017-11-05 ENCOUNTER — Ambulatory Visit: Payer: Medicare Other | Admitting: Physician Assistant

## 2017-11-05 ENCOUNTER — Telehealth: Payer: Self-pay | Admitting: Physician Assistant

## 2017-11-05 VITALS — BP 118/68 | HR 51 | Ht 73.0 in | Wt 187.0 lb

## 2017-11-05 DIAGNOSIS — I5042 Chronic combined systolic (congestive) and diastolic (congestive) heart failure: Secondary | ICD-10-CM | POA: Diagnosis not present

## 2017-11-05 DIAGNOSIS — R799 Abnormal finding of blood chemistry, unspecified: Secondary | ICD-10-CM

## 2017-11-05 DIAGNOSIS — R7989 Other specified abnormal findings of blood chemistry: Secondary | ICD-10-CM

## 2017-11-05 DIAGNOSIS — Z950 Presence of cardiac pacemaker: Secondary | ICD-10-CM

## 2017-11-05 DIAGNOSIS — I10 Essential (primary) hypertension: Secondary | ICD-10-CM | POA: Diagnosis not present

## 2017-11-05 DIAGNOSIS — N184 Chronic kidney disease, stage 4 (severe): Secondary | ICD-10-CM | POA: Diagnosis not present

## 2017-11-05 DIAGNOSIS — Z79899 Other long term (current) drug therapy: Secondary | ICD-10-CM

## 2017-11-05 DIAGNOSIS — E875 Hyperkalemia: Secondary | ICD-10-CM

## 2017-11-05 DIAGNOSIS — I2510 Atherosclerotic heart disease of native coronary artery without angina pectoris: Secondary | ICD-10-CM

## 2017-11-05 LAB — CBC WITH DIFFERENTIAL/PLATELET
Basophils Absolute: 0 10*3/uL (ref 0.0–0.2)
Basos: 0 %
EOS (ABSOLUTE): 0.3 10*3/uL (ref 0.0–0.4)
Eos: 4 %
Hematocrit: 32.7 % — ABNORMAL LOW (ref 37.5–51.0)
Hemoglobin: 10.7 g/dL — ABNORMAL LOW (ref 13.0–17.7)
Immature Grans (Abs): 0 10*3/uL (ref 0.0–0.1)
Immature Granulocytes: 0 %
Lymphocytes Absolute: 1.3 10*3/uL (ref 0.7–3.1)
Lymphs: 19 %
MCH: 29.3 pg (ref 26.6–33.0)
MCHC: 32.7 g/dL (ref 31.5–35.7)
MCV: 90 fL (ref 79–97)
Monocytes Absolute: 0.6 10*3/uL (ref 0.1–0.9)
Monocytes: 8 %
Neutrophils Absolute: 4.7 10*3/uL (ref 1.4–7.0)
Neutrophils: 69 %
Platelets: 213 10*3/uL (ref 150–379)
RBC: 3.65 x10E6/uL — ABNORMAL LOW (ref 4.14–5.80)
RDW: 14.6 % (ref 12.3–15.4)
WBC: 6.9 10*3/uL (ref 3.4–10.8)

## 2017-11-05 LAB — COMPREHENSIVE METABOLIC PANEL
A/G RATIO: 1.4 (ref 1.2–2.2)
ALBUMIN: 3.8 g/dL (ref 3.5–4.7)
ALT: 24 IU/L (ref 0–44)
AST: 19 IU/L (ref 0–40)
Alkaline Phosphatase: 64 IU/L (ref 39–117)
BUN / CREAT RATIO: 16 (ref 10–24)
BUN: 49 mg/dL — ABNORMAL HIGH (ref 8–27)
Bilirubin Total: 0.5 mg/dL (ref 0.0–1.2)
CO2: 22 mmol/L (ref 20–29)
CREATININE: 2.97 mg/dL — AB (ref 0.76–1.27)
Calcium: 9 mg/dL (ref 8.6–10.2)
Chloride: 104 mmol/L (ref 96–106)
GFR calc Af Amer: 21 mL/min/{1.73_m2} — ABNORMAL LOW (ref >59)
GFR, EST NON AFRICAN AMERICAN: 18 mL/min/{1.73_m2} — AB (ref >59)
GLOBULIN, TOTAL: 2.8 g/dL (ref 1.5–4.5)
Glucose: 86 mg/dL (ref 65–99)
Potassium: 5.4 mmol/L — ABNORMAL HIGH (ref 3.5–5.2)
SODIUM: 142 mmol/L (ref 134–144)
Total Protein: 6.6 g/dL (ref 6.0–8.5)

## 2017-11-05 NOTE — Progress Notes (Signed)
Cardiology Office Note    Date:  11/05/2017   ID:  Bradley [name], DOB August 12, 1927, MRN [id number]  PCP:  [name], MD  Cardiologist: Sinclair Grooms, MD EPS Dr. Lovena Le Chief Complaint  Patient presents with  . Follow-up    History of Present Illness:  Bradley [name] is a 84 y.o. male with history of CAD, CHF, CKD, hypertension and pacemaker admitted with syncope with hemoglobin of 7.2, GI bleed and and NSTEMI 09/2017 troponin peaked at 20 medical management recommended due to advanced age and GI bleed.  Discharged on aspirin, beta-blocker and Imdur.  Not a candidate for Plavix with GI bleed.  LVEF 25% on echo treated with carvedilol and Imdur.  Recommend restart hydralazine as an outpatient..  Patient comes in today accompanied by his son-in-law.  Medications were not brought in with the patient.  I spoke with the patient's daughter on the phone who said the kidney specialist that the creatinine was 2.7 and they decreased 2 of his medications but she is not sure which ones.  Overall he looks good.  He denies chest pain, palpitations, dyspnea, dizziness or presyncope.  The family brought him home from the rehab facility last week.  Patient's daughter said he was complaining of leg cramps last night.    Past Medical History:  Diagnosis Date  . Arthritis   . BPH (benign prostatic hypertrophy)   . Cardiomyopathy ([place])    a. thought to be ischemic with high risk MPS EF 23%. Reluctant to do cath due to CKD  . Cataract   . Chronic kidney disease   . Dysrhythmia   . GERD (gastroesophageal reflux disease)   . Hypertension   . PPD positive    a. HX POSITIVE PPD WITH NEGATIVE CXR  . Status post placement of cardiac pacemaker    a. 04/2015: bradycardic arrest s/p Biotronik serial #[id number] pacemaker.  . Strabismus    a. right eye  . Symptomatic bradycardia    a. s/p Biotronik serial [id number] pacemaker.  . Syncope and collapse    a. in 2014 and again in 08/2014. Thought to be  vasovagal   . Varicosities of leg     Past Surgical History:  Procedure Laterality Date  . CATARACT EXTRACTION W/ INTRAOCULAR LENS IMPLANT     BOTH EYES  . EP IMPLANTABLE DEVICE N/A 05/13/2015   Procedure: Pacemaker Implant;  Surgeon: Evans Lance, MD;  Location: [facility];  Service: Cardiovascular;  Laterality: N/A;  . ESOPHAGOGASTRODUODENOSCOPY (EGD) WITH PROPOFOL Left 10/04/2017   Procedure: ESOPHAGOGASTRODUODENOSCOPY (EGD) WITH PROPOFOL;  Surgeon: Ronnette Juniper, MD;  Location: [facility];  Service: Gastroenterology;  Laterality: Left;  . IR ANGIOGRAM SELECTIVE EACH ADDITIONAL VESSEL  10/04/2017  . IR ANGIOGRAM VISCERAL SELECTIVE  10/04/2017  . IR EMBO ART  VEN HEMORR LYMPH EXTRAV  INC GUIDE ROADMAPPING  10/04/2017  . IR US GUIDE VASC ACCESS RIGHT  10/04/2017  . TONSILLECTOMY     "I was young" (10/15/2012)  . TOTAL KNEE ARTHROPLASTY Left 12/01/2012   Procedure: LEFT TOTAL KNEE ARTHROPLASTY;  Surgeon: [name], MD;  Location: [facility];  Service: Orthopedics;  Laterality: Left;  . TRANSURETHRAL RESECTION OF PROSTATE  2006    Current Medications: Current Meds  Medication Sig  . acetaminophen (TYLENOL) 500 MG tablet Take 500 mg by mouth at bedtime as needed for headache (pain).  Marland Kitchen aspirin EC 81 MG tablet Take 1 tablet (81 mg total) by mouth daily. If hemoglobin is  stable  . carvedilol (COREG) 6.25 MG tablet TAKE 1 TABLET (6.25 MG TOTAL) BY MOUTH 2 (TWO) TIMES DAILY. (Patient taking differently: Take 3.125 mg by mouth 2 (two) times daily. )  . cholecalciferol (VITAMIN D) 1000 UNITS tablet Take 1,000 Units by mouth 2 (two) times daily.  Marland Kitchen docusate sodium (COLACE) 100 MG capsule Take 100 mg by mouth 2 (two) times daily as needed for mild constipation.  . dorzolamide-timolol (COSOPT) 22.3-6.8 MG/ML ophthalmic solution Place 1 drop into both eyes 2 (two) times daily.   . furosemide (LASIX) 40 MG tablet Take 1 tablet (40 mg total) by mouth daily.  . hydrALAZINE (APRESOLINE) 25 MG tablet  TAKE 1 TABLET (25 MG TOTAL) BY MOUTH 2 (TWO) TIMES DAILY.  . isosorbide mononitrate (IMDUR) 60 MG 24 hr tablet Take 60 mg by mouth daily.  Marland Kitchen latanoprost (XALATAN) 0.005 % ophthalmic solution Place 1 drop into both eyes at bedtime.  . Multiple Vitamin (MULTIVITAMIN WITH MINERALS) TABS tablet Take 1 tablet by mouth daily.  . nitroGLYCERIN (NITROSTAT) 0.4 MG SL tablet Place 0.4 mg under the tongue every 5 (five) minutes as needed for chest pain (max 3 doses within 15 minutes call 911).  . pantoprazole (PROTONIX) 40 MG tablet Take 1 tablet (40 mg total) by mouth 2 (two) times daily.  Marland Kitchen senna-docusate (SENOKOT-S) 8.6-50 MG tablet Take 1 tablet by mouth at bedtime as needed for mild constipation.     Allergies:   Patient has no known allergies.   Social History   Socioeconomic History  . Marital status: Married    Spouse name: Not on file  . Number of children: Not on file  . Years of education: Not on file  . Highest education level: Not on file  Occupational History  . Occupation: Retired  Scientific laboratory technician  . Financial resource strain: Not on file  . Food insecurity:    Worry: Not on file    Inability: Not on file  . Transportation needs:    Medical: Not on file    Non-medical: Not on file  Tobacco Use  . Smoking status: Never Smoker  . Smokeless tobacco: Never Used  Substance and Sexual Activity  . Alcohol use: No  . Drug use: No  . Sexual activity: Never  Lifestyle  . Physical activity:    Days per week: Not on file    Minutes per session: Not on file  . Stress: Not on file  Relationships  . Social connections:    Talks on phone: Not on file    Gets together: Not on file    Attends religious service: Not on file    Active member of club or organization: Not on file    Attends meetings of clubs or organizations: Not on file    Relationship status: Not on file  Other Topics Concern  . Not on file  Social History Narrative   Lives with his wife.     Family History:  The  patient's family history includes Heart failure in his mother; Hypertension in his brother and mother; Pneumonia in his father.   ROS:   Please see the history of present illness.    Review of Systems  Constitution: Negative.  HENT: Negative.   Cardiovascular: Negative.   Respiratory: Negative.   Endocrine: Negative.   Hematologic/Lymphatic: Negative.   Musculoskeletal: Positive for muscle weakness.  Gastrointestinal: Negative.   Genitourinary: Negative.   Neurological: Positive for weakness.   All other systems reviewed and are negative.  PHYSICAL EXAM:   VS:  BP 118/68   Pulse (!) 51   Ht 6\' 1"  (1.854 m)   Wt 187 lb (84.8 kg)   SpO2 96%   BMI 24.67 kg/m   Physical Exam  GEN: Well nourished, well developed, in no acute distress  Neck: no JVD, carotid bruits, or masses Cardiac:RRR; no murmurs, rubs, or gallops  Respiratory:  clear to auscultation bilaterally, normal work of breathing GI: soft, nontender, nondistended, + BS Ext: without cyanosis, clubbing, or edema, Good distal pulses bilaterally Neuro:  Alert and Oriented x 3 Psych: euthymic mood, full affect  Wt Readings from Last 3 Encounters:  11/05/17 187 lb (84.8 kg)  10/10/17 190 lb 11.2 oz (86.5 kg)  09/24/17 180 lb 3.2 oz (81.7 kg)      Studies/Labs Reviewed:   EKG:  EKG is not ordered today.  Recent Labs: 09/22/2017: B Natriuretic Peptide 141.5 10/03/2017: TSH 2.621 10/04/2017: ALT 15 10/10/2017: BUN 43; Creatinine, Ser 2.47; Hemoglobin 9.4; Platelets 189; Potassium 3.6; Sodium 140   Lipid Panel    Component Value Date/Time   CHOL 162 10/16/2012 0510   TRIG 110 10/16/2012 0510   HDL 39 (L) 10/16/2012 0510   CHOLHDL 4.2 10/16/2012 0510   VLDL 22 10/16/2012 0510   LDLCALC 101 (H) 10/16/2012 0510    Additional studies/ records that were reviewed today include:  2D echo 2/25/19Study Conclusions   - Left ventricle: LVEF is severely depressed at approximately 30%   with diffuse hypokinesis; inferior  akinesis.. The cavity size was   normal. Wall thickness was increased in a pattern of mild LVH.   Systolic function was severely reduced. The estimated ejection   fraction was in the range of 25% to 30%. Doppler parameters are   consistent with abnormal left ventricular relaxation (grade 1   diastolic dysfunction). - Aortic valve: There was mild regurgitation. - Mitral valve: Calcified annulus. Mildly thickened leaflets . - Left atrium: The atrium was mildly dilated.   Impressions:   - NO significant change in LV function compared to report of 2016.   NST 2016Overall Impression:  High risk stress nuclear study with a large scar in the entire inferior, basal and mid inferolateral wall with no ischemia. .   LV Ejection Fraction: 23%.  LV Wall Motion:  Severely decreased LVEF with diffuse hypokinesis and akinesis of the entire inferior and inferolateral walls.     Dorothy Spark 08/27/2014       ASSESSMENT:    1. CAD in native artery   2. Chronic combined systolic (congestive) and diastolic (congestive) heart failure ([place])   3. Essential hypertension   4. Status post placement of cardiac pacemaker   5. Chronic kidney disease, stage IV (severe) ([facility])      PLAN:  In order of problems listed above:  CAD status post NSTEMI 09/2017 with peak troponin of 20 managed medically given age and comorbidities.  Also occurred in the setting of GI bleed requiring transfusions.  On aspirin and Coreg.  Patient without angina.  Chronic combined systolic and diastolic CHF LVEF 37% on echo 08/2017 no evidence of heart failure on exam.  2 medications were adjusted at home but a call to the wife did not help figure out which medicines these were.  They are going to the pharmacy today and will call us back with the list.  Check labs today.  Essential hypertension blood pressure is stable today.  Status post pacemaker followed by Dr. Lovena Le  CKD followed  by renal and most recent creatinine 2.7  according to his daughter.  Patient's family thinks the carvedilol was decreased to 3.125 mg twice daily but they will call after going to the pharmacy today.  Medication Adjustments/Labs and Tests Ordered: Current medicines are reviewed at length with the patient today.  Concerns regarding medicines are outlined above.  Medication changes, Labs and Tests ordered today are listed in the Patient Instructions below.    Sumner Boast, PA-C  11/05/2017 10:51 AM    [facility] [place], [place], [place]  [address] Phone: [phone]; Fax: [phone]

## 2017-11-05 NOTE — Telephone Encounter (Signed)
No answer on phone number provided.

## 2017-11-05 NOTE — Telephone Encounter (Signed)
New message  Patient spouse calling with follow up questions from office visit today  Pt c/o medication issue:  1. Name of Medication: isosorbide mononitrate (IMDUR)   2. How are you currently taking this medication (dosage and times per day)? As prescribed  3. Are you having a reaction (difficulty breathing--STAT)?no  4. What is your medication issue? Patient's spouse calling with questions about medication

## 2017-11-05 NOTE — Patient Instructions (Signed)
Medication Instructions:  Your physician recommends that you continue on your current medications as directed. Please refer to the Current Medication list given to you today.  Labwork: Your physician recommends that you have lab work today- CMET and CBC   Testing/Procedures: NONE  Follow-Up: Your physician wants you to follow-up in: 6 weeks with Dr. Tamala Julian.    If you need a refill on your cardiac medications before your next appointment, please call your pharmacy.  ;n

## 2017-11-06 NOTE — Telephone Encounter (Signed)
Called patient about lab results. Per Ermalinda Barrios PA, Kidney function up.  Potassium high.  Will need to come in for repeat bmet today or tomorrow.  Make sure he has not taken Lasix twice daily.  When he comes in for his Bmet asked him to bring in all his medications that he is taking. Patient will come in tomorrow for repeat BMET. Patient is taking Lasix 40 mg by mouth daily. His family also reported that he is take Imdur 30 mg by mouth daily. Patient is going to bring is medications with him tomorrow when he comes in for lab work. Family also stated he is starting home health. Updated patient's medication list.

## 2017-11-07 ENCOUNTER — Other Ambulatory Visit: Payer: Medicare Other | Admitting: *Deleted

## 2017-11-07 DIAGNOSIS — R7989 Other specified abnormal findings of blood chemistry: Secondary | ICD-10-CM

## 2017-11-07 DIAGNOSIS — Z79899 Other long term (current) drug therapy: Secondary | ICD-10-CM

## 2017-11-07 DIAGNOSIS — R799 Abnormal finding of blood chemistry, unspecified: Secondary | ICD-10-CM

## 2017-11-07 DIAGNOSIS — E875 Hyperkalemia: Secondary | ICD-10-CM

## 2017-11-07 LAB — BASIC METABOLIC PANEL
BUN / CREAT RATIO: 14 (ref 10–24)
BUN: 50 mg/dL — AB (ref 8–27)
CO2: 21 mmol/L (ref 20–29)
CREATININE: 3.48 mg/dL — AB (ref 0.76–1.27)
Calcium: 8.7 mg/dL (ref 8.6–10.2)
Chloride: 103 mmol/L (ref 96–106)
GFR calc non Af Amer: 15 mL/min/{1.73_m2} — ABNORMAL LOW (ref >59)
GFR, EST AFRICAN AMERICAN: 17 mL/min/{1.73_m2} — AB (ref >59)
Glucose: 92 mg/dL (ref 65–99)
Potassium: 5.2 mmol/L (ref 3.5–5.2)
Sodium: 138 mmol/L (ref 134–144)

## 2017-11-07 NOTE — Telephone Encounter (Signed)
Patient came in for lab work. Compared medications patient had with him to his list. Patient is currently not taking ASA. On patient's furosemide bottle it stated that patient was to take twice. Informed patient he is only to take once daily. Patient and his son verbalized understanding. Called patient's wife about patient's PRN medications and left Tylenol, colace, and nitro on patient's list. Patient's wife stated that ASA was discontinued due to bleeding. Will see if ASA needs to be continued or not. Will forward message to Ermalinda Barrios PA.

## 2017-11-08 ENCOUNTER — Telehealth: Payer: Self-pay | Admitting: *Deleted

## 2017-11-08 DIAGNOSIS — Z79899 Other long term (current) drug therapy: Secondary | ICD-10-CM

## 2017-11-08 NOTE — Telephone Encounter (Signed)
-----   Message from Imogene Burn, PA-C sent at 11/08/2017  7:07 AM EDT ----- Potassium normal but kidney function much worse. Patient was taking lasix wrong-twice a day. He was instructed yest to take lasix 40 mg once daily. Please forward this to his kidney Dr. To see if they have any further recommendations. Also will need a bmet next week. Also should be on a baby ASA 81 mg with food daily. F/u CBC from hospital was better. He needs close f/u with home health for meds/CHF.

## 2017-11-08 NOTE — Telephone Encounter (Signed)
Yes, he needs to be on baby ASA. Already addressed on another result note.Thanks.

## 2017-11-12 ENCOUNTER — Ambulatory Visit (INDEPENDENT_AMBULATORY_CARE_PROVIDER_SITE_OTHER): Payer: Medicare Other | Admitting: *Deleted

## 2017-11-12 DIAGNOSIS — I495 Sick sinus syndrome: Secondary | ICD-10-CM

## 2017-11-12 NOTE — Telephone Encounter (Signed)
Patient's wife aware that patient should be taking ASA 81 mg by mouth daily. Patient's wife verbalized understanding and stated patient started ASA yesterday.

## 2017-11-12 NOTE — Progress Notes (Signed)
Remote pacemaker transmission.   

## 2017-11-14 ENCOUNTER — Other Ambulatory Visit: Payer: Medicare Other

## 2017-11-14 ENCOUNTER — Encounter: Payer: Self-pay | Admitting: Cardiology

## 2017-11-14 DIAGNOSIS — Z79899 Other long term (current) drug therapy: Secondary | ICD-10-CM

## 2017-11-15 LAB — BASIC METABOLIC PANEL
BUN/Creatinine Ratio: 16 (ref 10–24)
BUN: 48 mg/dL — AB (ref 8–27)
CALCIUM: 8.3 mg/dL — AB (ref 8.6–10.2)
CO2: 21 mmol/L (ref 20–29)
Chloride: 105 mmol/L (ref 96–106)
Creatinine, Ser: 2.93 mg/dL — ABNORMAL HIGH (ref 0.76–1.27)
GFR, EST AFRICAN AMERICAN: 21 mL/min/{1.73_m2} — AB (ref >59)
GFR, EST NON AFRICAN AMERICAN: 18 mL/min/{1.73_m2} — AB (ref >59)
Glucose: 85 mg/dL (ref 65–99)
POTASSIUM: 5.5 mmol/L — AB (ref 3.5–5.2)
Sodium: 139 mmol/L (ref 134–144)

## 2017-11-21 ENCOUNTER — Other Ambulatory Visit: Payer: Medicare Other | Admitting: *Deleted

## 2017-11-21 ENCOUNTER — Other Ambulatory Visit: Payer: Self-pay | Admitting: *Deleted

## 2017-11-21 DIAGNOSIS — R7989 Other specified abnormal findings of blood chemistry: Secondary | ICD-10-CM

## 2017-11-22 LAB — BASIC METABOLIC PANEL
BUN/Creatinine Ratio: 15 (ref 10–24)
BUN: 44 mg/dL — AB (ref 8–27)
CALCIUM: 8.4 mg/dL — AB (ref 8.6–10.2)
CHLORIDE: 105 mmol/L (ref 96–106)
CO2: 20 mmol/L (ref 20–29)
Creatinine, Ser: 2.95 mg/dL — ABNORMAL HIGH (ref 0.76–1.27)
GFR calc non Af Amer: 18 mL/min/{1.73_m2} — ABNORMAL LOW (ref >59)
GFR, EST AFRICAN AMERICAN: 21 mL/min/{1.73_m2} — AB (ref >59)
Glucose: 91 mg/dL (ref 65–99)
Potassium: 5 mmol/L (ref 3.5–5.2)
Sodium: 137 mmol/L (ref 134–144)

## 2017-11-26 LAB — CUP PACEART REMOTE DEVICE CHECK
Brady Statistic AP VS Percent: 56 %
Brady Statistic AS VP Percent: 2 %
Date Time Interrogation Session: 20190430064524
Implantable Lead Implant Date: 20161016
Implantable Lead Implant Date: 20161016
Implantable Lead Location: 753860
Implantable Lead Serial Number: 49267833
Implantable Lead Serial Number: 49296158
Lead Channel Pacing Threshold Amplitude: 0.8 V
Lead Channel Pacing Threshold Amplitude: 1.4 V
Lead Channel Pacing Threshold Pulse Width: 0.4 ms
Lead Channel Setting Pacing Amplitude: 2.8 V
MDC IDC LEAD LOCATION: 753859
MDC IDC MSMT BATTERY REMAINING PERCENTAGE: 85 %
MDC IDC MSMT LEADCHNL RA IMPEDANCE VALUE: 521 Ohm
MDC IDC MSMT LEADCHNL RV IMPEDANCE VALUE: 459 Ohm
MDC IDC MSMT LEADCHNL RV PACING THRESHOLD PULSEWIDTH: 0.4 ms
MDC IDC PG IMPLANT DT: 20161016
MDC IDC SET LEADCHNL RA PACING AMPLITUDE: 2 V
MDC IDC SET LEADCHNL RV PACING PULSEWIDTH: 0.4 ms
MDC IDC STAT BRADY AP VP PERCENT: 6 %
MDC IDC STAT BRADY AS VS PERCENT: 33 %
MDC IDC STAT BRADY RA PERCENT PACED: 63 %
MDC IDC STAT BRADY RV PERCENT PACED: 8 %
Pulse Gen Model: 394969
Pulse Gen Serial Number: 68596848

## 2017-12-19 NOTE — Progress Notes (Signed)
Cardiology Office Note   Date:  12/20/2017   ID:  AUGUSTO DECKMAN, DOB 22-Jan-1928, MRN [id number]  PCP:  [name], MD  Cardiologist:  Dr. Tamala Julian    Chief Complaint  Patient presents with  . Hypertension      History of Present Illness: JASIR ROTHER is a 84 y.o. male who presents for renal insuff.  BP check.    He has a history of CAD, CHF, CKD, hypertension and pacemaker admitted with syncope with hemoglobin of 7.2, GI bleed and and NSTEMI 09/2017 troponin peaked at 20 medical management recommended due to advanced age and GI bleed.  Discharged on aspirin, beta-blocker and Imdur.  Not a candidate for Plavix with GI bleed.  LVEF 25% on echo treated with carvedilol and Imdur.  Recommend restart hydralazine as an outpatient.. This was done   His Cr was elevated and his Lasix was decreased to 40 mg daily.   His coreg had been decreased to 3.125 mg BID.  His Cr did improve from 3.48 to 2.93   Today he is feeling well.  No chest pain, and no SOB.  He has some constipation.  Discussed miralax, he is on a stool softener.  His family is with him today.  His wt at home is 181 lbs.  He does weigh daily.  Overall he continues to lose wt.  He states he has a good appetite.    Past Medical History:  Diagnosis Date  . Arthritis   . BPH (benign prostatic hypertrophy)   . Cardiomyopathy ([place])    a. thought to be ischemic with high risk MPS EF 23%. Reluctant to do cath due to CKD  . Cataract   . Chronic kidney disease   . Dysrhythmia   . GERD (gastroesophageal reflux disease)   . Hypertension   . PPD positive    a. HX POSITIVE PPD WITH NEGATIVE CXR  . Status post placement of cardiac pacemaker    a. 04/2015: bradycardic arrest s/p Biotronik serial #[id number] pacemaker.  . Strabismus    a. right eye  . Symptomatic bradycardia    a. s/p Biotronik serial [id number] pacemaker.  . Syncope and collapse    a. in 2014 and again in 08/2014. Thought to be vasovagal   . Varicosities of  leg     Past Surgical History:  Procedure Laterality Date  . CATARACT EXTRACTION W/ INTRAOCULAR LENS IMPLANT     BOTH EYES  . EP IMPLANTABLE DEVICE N/A 05/13/2015   Procedure: Pacemaker Implant;  Surgeon: Evans Lance, MD;  Location: [facility];  Service: Cardiovascular;  Laterality: N/A;  . ESOPHAGOGASTRODUODENOSCOPY (EGD) WITH PROPOFOL Left 10/04/2017   Procedure: ESOPHAGOGASTRODUODENOSCOPY (EGD) WITH PROPOFOL;  Surgeon: Ronnette Juniper, MD;  Location: [facility];  Service: Gastroenterology;  Laterality: Left;  . IR ANGIOGRAM SELECTIVE EACH ADDITIONAL VESSEL  10/04/2017  . IR ANGIOGRAM VISCERAL SELECTIVE  10/04/2017  . IR EMBO ART  VEN HEMORR LYMPH EXTRAV  INC GUIDE ROADMAPPING  10/04/2017  . IR US GUIDE VASC ACCESS RIGHT  10/04/2017  . TONSILLECTOMY     "I was young" (10/15/2012)  . TOTAL KNEE ARTHROPLASTY Left 12/01/2012   Procedure: LEFT TOTAL KNEE ARTHROPLASTY;  Surgeon: [name], MD;  Location: [facility];  Service: Orthopedics;  Laterality: Left;  . TRANSURETHRAL RESECTION OF PROSTATE  2006     Current Outpatient Medications  Medication Sig Dispense Refill  . acetaminophen (TYLENOL) 500 MG tablet Take 500 mg by mouth at bedtime as  needed for headache (pain).    Marland Kitchen aspirin EC 81 MG tablet Take 1 tablet (81 mg total) by mouth daily. If hemoglobin is stable    . carvedilol (COREG) 6.25 MG tablet Take 3.125 mg by mouth 2 (two) times daily with a meal.    . cholecalciferol (VITAMIN D) 1000 UNITS tablet Take 1,000 Units by mouth 2 (two) times daily.    Marland Kitchen docusate sodium (COLACE) 100 MG capsule Take 100 mg by mouth 2 (two) times daily as needed for mild constipation.    . dorzolamide-timolol (COSOPT) 22.3-6.8 MG/ML ophthalmic solution Place 1 drop into both eyes 2 (two) times daily.     . furosemide (LASIX) 40 MG tablet Take 1 tablet (40 mg total) by mouth daily. 90 tablet 2  . hydrALAZINE (APRESOLINE) 25 MG tablet TAKE 1 TABLET (25 MG TOTAL) BY MOUTH 2 (TWO) TIMES DAILY. 60 tablet 11    . isosorbide mononitrate (ISMO,MONOKET) 20 MG tablet Take 30 mg by mouth daily.    Marland Kitchen latanoprost (XALATAN) 0.005 % ophthalmic solution Place 1 drop into both eyes at bedtime.    . Multiple Vitamin (MULTIVITAMIN WITH MINERALS) TABS tablet Take 1 tablet by mouth daily.    . nitroGLYCERIN (NITROSTAT) 0.4 MG SL tablet Place 0.4 mg under the tongue every 5 (five) minutes as needed for chest pain (max 3 doses within 15 minutes call 911).    . pantoprazole (PROTONIX) 40 MG tablet Take 1 tablet (40 mg total) by mouth 2 (two) times daily. 60 tablet 0   No current facility-administered medications for this visit.     Allergies:   Patient has no known allergies.    Social History:  The patient  reports that he has never smoked. He has never used smokeless tobacco. He reports that he does not drink alcohol or use drugs.   Family History:  The patient's family history includes Heart failure in his mother; Hypertension in his brother and mother; Pneumonia in his father.    ROS:  General:no colds or fevers, no weight changes Skin:no rashes or ulcers HEENT:no blurred vision, no congestion CV:see HPI PUL:see HPI GI:no diarrhea constipation or melena, no indigestion GU:no hematuria, no dysuria MS:no joint pain, no claudication Neuro:no syncope, no lightheadedness Endo:no diabetes, no thyroid disease  Wt Readings from Last 3 Encounters:  12/20/17 185 lb 8 oz (84.1 kg)  11/05/17 187 lb (84.8 kg)  10/10/17 190 lb 11.2 oz (86.5 kg)     PHYSICAL EXAM: VS:  BP 108/68   Pulse 89   Ht 6\' 1"  (1.854 m)   Wt 185 lb 8 oz (84.1 kg)   SpO2 96%   BMI 24.47 kg/m  , BMI Body mass index is 24.47 kg/m. General:Pleasant affect, NAD Skin:Warm and dry, brisk capillary refill HEENT:normocephalic, sclera clear, mucus membranes moist Neck:supple, no JVD, no bruits  Heart:S1S2 RRR without murmur, gallup, rub or click Lungs:clear without rales, rhonchi, or wheezes ZOX:WRUE, non tender, + BS, do not palpate  liver spleen or masses Ext:no lower ext edema, 2+ pedal pulses, 2+ radial pulses Neuro:alert and oriented, MAE, follows commands, + facial symmetry    EKG:  EKG is NOT ordered today.  Recent Labs: 09/22/2017: B Natriuretic Peptide 141.5 10/03/2017: TSH 2.621 11/05/2017: ALT 24; Hemoglobin 10.7; Platelets 213 11/21/2017: BUN 44; Creatinine, Ser 2.95; Potassium 5.0; Sodium 137    Lipid Panel    Component Value Date/Time   CHOL 162 10/16/2012 0510   TRIG 110 10/16/2012 0510   HDL 39 (L)  10/16/2012 0510   CHOLHDL 4.2 10/16/2012 0510   VLDL 22 10/16/2012 0510   LDLCALC 101 (H) 10/16/2012 0510       Other studies Reviewed: Additional studies/ records that were reviewed today include: . Echo 09/23/17 Study Conclusions  - Left ventricle: LVEF is severely depressed at approximately 30%   with diffuse hypokinesis; inferior akinesis.. The cavity size was   normal. Wall thickness was increased in a pattern of mild LVH.   Systolic function was severely reduced. The estimated ejection   fraction was in the range of 25% to 30%. Doppler parameters are   consistent with abnormal left ventricular relaxation (grade 1   diastolic dysfunction). - Aortic valve: There was mild regurgitation. - Mitral valve: Calcified annulus. Mildly thickened leaflets . - Left atrium: The atrium was mildly dilated.  Impressions:  - NO significant change in LV function compared to report of 2016.  Carotid Dopplers with 1-39% bilateral stenosis 09/23/17  Pacer interrogation 11/12/17 was stable.  NST 2016Overall Impression: High risk stress nuclear study with a large scar in the entire inferior, basal and mid inferolateral wall with no ischemia. .  LV Ejection Fraction: 23%. LV Wall Motion: Severely decreased LVEF with diffuse hypokinesis and akinesis of the entire inferior and inferolateral walls.     ASSESSMENT AND PLAN:  1.  CAD with NSTEMI 09/2017, pk troponin 20 managing medically with age and  comorbidities. Continue ASA and Coreg, no angina.  NSTEMI also in setting of GI bleed.   2.   Chronic combined systolic and diastolic HF  EF 60%.  No HF currently.    3.  Acute on chronic renal failure.  Will check labs today, may need to decrease lasix if Cr is still climbing.    4.  HTN  Lower end today.  Will monitor, no lightheadedness or dizziness.   5.  Hx PPM followed by Dt. Lovena Le.  Follow up with Dr. Tamala Julian in 2-3 months.   Current medicines are reviewed with the patient today.  The patient Has no concerns regarding medicines.  The following changes have been made:  See above Labs/ tests ordered today include:see above  Disposition:   FU:  see above  Signed, Cecilie Kicks, NP  12/20/2017 10:56 AM    [address], [place], [place], [place] Phone: [phone]; Fax: [phone]

## 2017-12-20 ENCOUNTER — Encounter: Payer: Self-pay | Admitting: Cardiology

## 2017-12-20 ENCOUNTER — Ambulatory Visit (INDEPENDENT_AMBULATORY_CARE_PROVIDER_SITE_OTHER): Payer: Medicare Other | Admitting: Cardiology

## 2017-12-20 ENCOUNTER — Telehealth: Payer: Self-pay | Admitting: *Deleted

## 2017-12-20 VITALS — BP 108/68 | HR 89 | Ht 73.0 in | Wt 185.5 lb

## 2017-12-20 DIAGNOSIS — R7989 Other specified abnormal findings of blood chemistry: Secondary | ICD-10-CM | POA: Diagnosis not present

## 2017-12-20 DIAGNOSIS — Z950 Presence of cardiac pacemaker: Secondary | ICD-10-CM | POA: Diagnosis not present

## 2017-12-20 DIAGNOSIS — E876 Hypokalemia: Secondary | ICD-10-CM

## 2017-12-20 DIAGNOSIS — I2510 Atherosclerotic heart disease of native coronary artery without angina pectoris: Secondary | ICD-10-CM

## 2017-12-20 DIAGNOSIS — I10 Essential (primary) hypertension: Secondary | ICD-10-CM

## 2017-12-20 DIAGNOSIS — I5042 Chronic combined systolic (congestive) and diastolic (congestive) heart failure: Secondary | ICD-10-CM

## 2017-12-20 LAB — BASIC METABOLIC PANEL
BUN/Creatinine Ratio: 15 (ref 10–24)
BUN: 50 mg/dL — ABNORMAL HIGH (ref 8–27)
CALCIUM: 9 mg/dL (ref 8.6–10.2)
CHLORIDE: 108 mmol/L — AB (ref 96–106)
CO2: 19 mmol/L — ABNORMAL LOW (ref 20–29)
Creatinine, Ser: 3.24 mg/dL — ABNORMAL HIGH (ref 0.76–1.27)
GFR calc non Af Amer: 16 mL/min/{1.73_m2} — ABNORMAL LOW (ref >59)
GFR, EST AFRICAN AMERICAN: 19 mL/min/{1.73_m2} — AB (ref >59)
Glucose: 101 mg/dL — ABNORMAL HIGH (ref 65–99)
Potassium: 4.7 mmol/L (ref 3.5–5.2)
SODIUM: 143 mmol/L (ref 134–144)

## 2017-12-20 MED ORDER — FUROSEMIDE 40 MG PO TABS: 40.0000 mg | ORAL_TABLET | ORAL | 3 refills | Status: AC

## 2017-12-20 NOTE — Telephone Encounter (Signed)
Spoke with patient and his wife. Instructed to change lasix to every other day-starting with skipping Saturday. Will come for repeat BMET on Thursday.

## 2017-12-20 NOTE — Telephone Encounter (Signed)
-----   Message from Isaiah Serge, NP sent at 12/20/2017  5:22 PM EDT ----- Have pt only take lasix every other day and recheck BMP next Thursday.

## 2017-12-20 NOTE — Patient Instructions (Addendum)
Medication Instructions:  Your physician recommends that you continue on your current medications as directed. Please refer to the Current Medication list given to you today.   Labwork: TODAY:  BMET  Testing/Procedures: None ordered  Follow-Up: Your physician recommends that you schedule a follow-up appointment in: 3 MONTHS WITH DR. SMITH   Any Other Special Instructions Will Be Listed Below (If Applicable).     If you need a refill on your cardiac medications before your next appointment, please call your pharmacy.   

## 2017-12-21 ENCOUNTER — Other Ambulatory Visit: Payer: Self-pay | Admitting: Interventional Cardiology

## 2017-12-24 NOTE — Telephone Encounter (Signed)
Left message for patient to call back  

## 2017-12-24 NOTE — Telephone Encounter (Signed)
Pt's pharmacy is requesting a refill on isosorbide mononitrate (ISMO.MONOKET) 20 mg tablet taken 30 mg tablet daily. I do not see where the provider changed this medication. Pt was taking isosorbide mononitrate (IMDUR) 60 MG tablet taken 1 tablet daily. Can you please clarify if pt is suppose to be taking isosorbide mononitrate 60 mg tablet. Please address

## 2017-12-24 NOTE — Telephone Encounter (Signed)
Called patient's wife about medication. Patient was on Imdur 60 mg after his hospital stay in April. Patient went to SNF after hospital stay. When leaving SNF patient was switched to Imdur 30 mg (which was isosorbide mononitrate 20 mg, 1 1/2 tablets).  Patient has been doing fine on the 30 mg dose. Will forward to Dr. Tamala Julian to see if he wants patient to continue on 30 mg or go back to 60 mg.

## 2017-12-26 ENCOUNTER — Other Ambulatory Visit: Payer: Medicare Other | Admitting: *Deleted

## 2017-12-26 DIAGNOSIS — R7989 Other specified abnormal findings of blood chemistry: Secondary | ICD-10-CM

## 2017-12-26 LAB — BASIC METABOLIC PANEL
BUN / CREAT RATIO: 15 (ref 10–24)
BUN: 44 mg/dL — AB (ref 8–27)
CALCIUM: 8.8 mg/dL (ref 8.6–10.2)
CHLORIDE: 107 mmol/L — AB (ref 96–106)
CO2: 20 mmol/L (ref 20–29)
CREATININE: 2.99 mg/dL — AB (ref 0.76–1.27)
GFR calc Af Amer: 21 mL/min/{1.73_m2} — ABNORMAL LOW (ref >59)
GFR calc non Af Amer: 18 mL/min/{1.73_m2} — ABNORMAL LOW (ref >59)
GLUCOSE: 94 mg/dL (ref 65–99)
Potassium: 5.5 mmol/L — ABNORMAL HIGH (ref 3.5–5.2)
Sodium: 140 mmol/L (ref 134–144)

## 2017-12-30 ENCOUNTER — Other Ambulatory Visit: Payer: Self-pay | Admitting: *Deleted

## 2017-12-30 DIAGNOSIS — E875 Hyperkalemia: Secondary | ICD-10-CM

## 2018-01-03 ENCOUNTER — Other Ambulatory Visit: Payer: Medicare Other | Admitting: *Deleted

## 2018-01-03 DIAGNOSIS — E875 Hyperkalemia: Secondary | ICD-10-CM

## 2018-01-03 LAB — BASIC METABOLIC PANEL
BUN / CREAT RATIO: 16 (ref 10–24)
BUN: 42 mg/dL — ABNORMAL HIGH (ref 8–27)
CHLORIDE: 106 mmol/L (ref 96–106)
CO2: 20 mmol/L (ref 20–29)
CREATININE: 2.63 mg/dL — AB (ref 0.76–1.27)
Calcium: 8.6 mg/dL (ref 8.6–10.2)
GFR calc Af Amer: 24 mL/min/{1.73_m2} — ABNORMAL LOW (ref >59)
GFR calc non Af Amer: 21 mL/min/{1.73_m2} — ABNORMAL LOW (ref >59)
GLUCOSE: 91 mg/dL (ref 65–99)
POTASSIUM: 5.1 mmol/L (ref 3.5–5.2)
SODIUM: 138 mmol/L (ref 134–144)

## 2018-01-20 ENCOUNTER — Ambulatory Visit: Payer: Medicare Other | Admitting: Podiatry

## 2018-02-03 ENCOUNTER — Ambulatory Visit (INDEPENDENT_AMBULATORY_CARE_PROVIDER_SITE_OTHER): Payer: Medicare Other | Admitting: Podiatry

## 2018-02-03 ENCOUNTER — Encounter: Payer: Self-pay | Admitting: Podiatry

## 2018-02-03 DIAGNOSIS — B351 Tinea unguium: Secondary | ICD-10-CM

## 2018-02-03 DIAGNOSIS — M79676 Pain in unspecified toe(s): Secondary | ICD-10-CM

## 2018-02-06 NOTE — Progress Notes (Signed)
   SUBJECTIVE Patient presents to office today complaining of elongated, thickened nails that cause pain while ambulating in shoes. He is unable to trim his own nails. Patient is here for further evaluation and treatment.  Past Medical History:  Diagnosis Date  . Arthritis   . BPH (benign prostatic hypertrophy)   . Cardiomyopathy ([place])    a. thought to be ischemic with high risk MPS EF 23%. Reluctant to do cath due to CKD  . Cataract   . Chronic kidney disease   . Dysrhythmia   . GERD (gastroesophageal reflux disease)   . Hypertension   . PPD positive    a. HX POSITIVE PPD WITH NEGATIVE CXR  . Status post placement of cardiac pacemaker    a. 04/2015: bradycardic arrest s/p Biotronik serial #[id number] pacemaker.  . Strabismus    a. right eye  . Symptomatic bradycardia    a. s/p Biotronik serial [id number] pacemaker.  . Syncope and collapse    a. in 2014 and again in 08/2014. Thought to be vasovagal   . Varicosities of leg     OBJECTIVE General Patient is awake, alert, and oriented x 3 and in no acute distress. Derm Skin is dry and supple bilateral. Negative open lesions or macerations. Remaining integument unremarkable. Nails are tender, long, thickened and dystrophic with subungual debris, consistent with onychomycosis, 1-5 bilateral. No signs of infection noted. Vasc  DP and PT pedal pulses palpable bilaterally. Temperature gradient within normal limits.  Neuro Epicritic and protective threshold sensation grossly intact bilaterally.  Musculoskeletal Exam No symptomatic pedal deformities noted bilateral. Muscular strength within normal limits.  ASSESSMENT 1. Onychodystrophic nails 1-5 bilateral with hyperkeratosis of nails.  2. Onychomycosis of nail due to dermatophyte bilateral 3. Pain in foot bilateral  PLAN OF CARE 1. Patient evaluated today.  2. Instructed to maintain good pedal hygiene and foot care.  3. Mechanical debridement of nails 1-5 bilaterally performed using a  nail nipper. Filed with dremel without incident.  4. Return to clinic in 3 mos.    Edrick Kins, DPM [facility]  Dr. Edrick Kins, [place]                                        [place], [place] [address]                Office [phone]  Fax [phone]

## 2018-02-11 ENCOUNTER — Ambulatory Visit (INDEPENDENT_AMBULATORY_CARE_PROVIDER_SITE_OTHER): Payer: Medicare Other | Admitting: *Deleted

## 2018-02-11 ENCOUNTER — Other Ambulatory Visit: Payer: Self-pay | Admitting: Internal Medicine

## 2018-02-11 DIAGNOSIS — I495 Sick sinus syndrome: Secondary | ICD-10-CM

## 2018-02-11 NOTE — Progress Notes (Signed)
Remote pacemaker transmission.   

## 2018-02-12 ENCOUNTER — Encounter: Payer: Self-pay | Admitting: Cardiology

## 2018-02-14 LAB — CUP PACEART REMOTE DEVICE CHECK
Battery Remaining Percentage: 80 %
Brady Statistic AP VP Percent: 9 %
Brady Statistic RA Percent Paced: 71 %
Date Time Interrogation Session: 20190719063451
Implantable Lead Location: 753860
Implantable Lead Serial Number: 49267833
Implantable Lead Serial Number: 49296158
Implantable Pulse Generator Implant Date: 20161016
Lead Channel Pacing Threshold Amplitude: 0.7 V
Lead Channel Pacing Threshold Amplitude: 1.4 V
Lead Channel Setting Pacing Amplitude: 2 V
Lead Channel Setting Pacing Amplitude: 2.8 V
Lead Channel Setting Pacing Pulse Width: 0.4 ms
MDC IDC LEAD IMPLANT DT: 20161016
MDC IDC LEAD IMPLANT DT: 20161016
MDC IDC LEAD LOCATION: 753859
MDC IDC MSMT LEADCHNL RA IMPEDANCE VALUE: 517 Ohm
MDC IDC MSMT LEADCHNL RA PACING THRESHOLD PULSEWIDTH: 0.4 ms
MDC IDC MSMT LEADCHNL RV IMPEDANCE VALUE: 462 Ohm
MDC IDC MSMT LEADCHNL RV PACING THRESHOLD PULSEWIDTH: 0.4 ms
MDC IDC STAT BRADY AP VS PERCENT: 62 %
MDC IDC STAT BRADY AS VP PERCENT: 2 %
MDC IDC STAT BRADY AS VS PERCENT: 26 %
MDC IDC STAT BRADY RV PERCENT PACED: 11 %
Pulse Gen Model: 394969
Pulse Gen Serial Number: 68596848

## 2018-03-01 IMAGING — US US RENAL
1 series · 14 of 25 positions shown · non-contrast
Comparison: 08/12/2008.

CLINICAL DATA: Elevated creatinine.

EXAM:
RENAL / URINARY TRACT ULTRASOUND COMPLETE

[Series 1: us renal · 0.23mm/px · 14 of 39 slices shown]
[im 1/39]
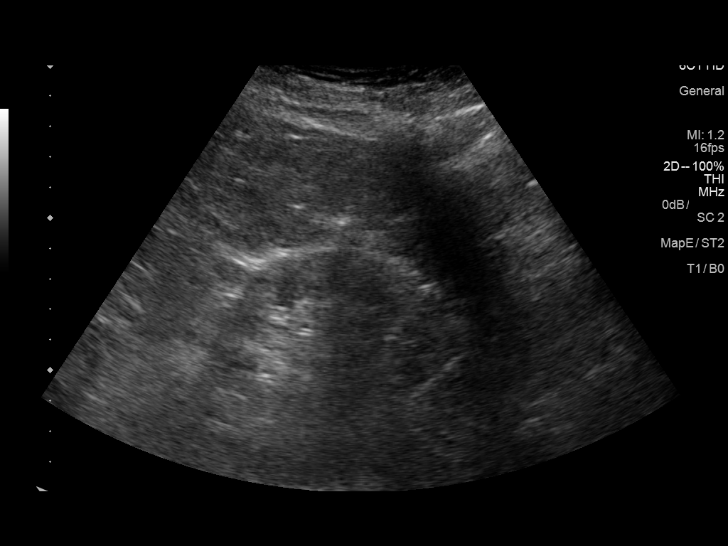
[im 4/39]
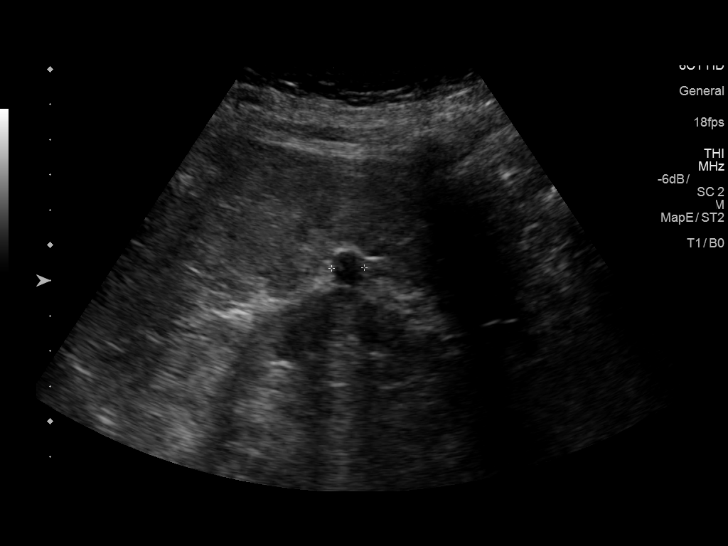
[im 7/39]
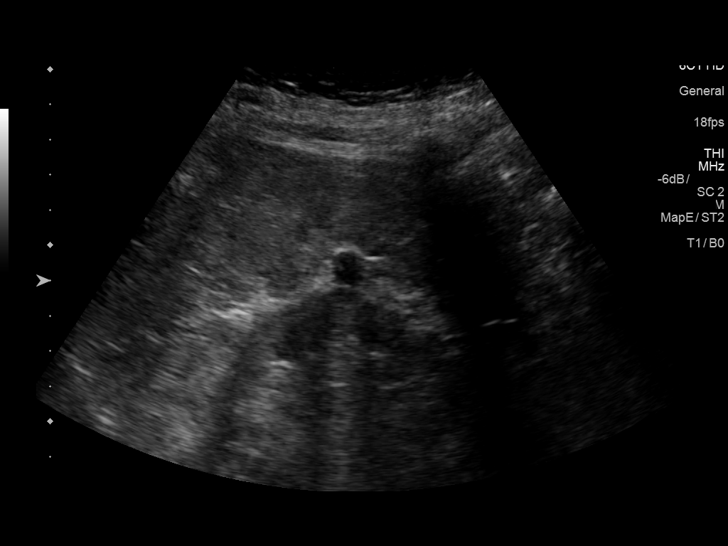
[im 10/39]
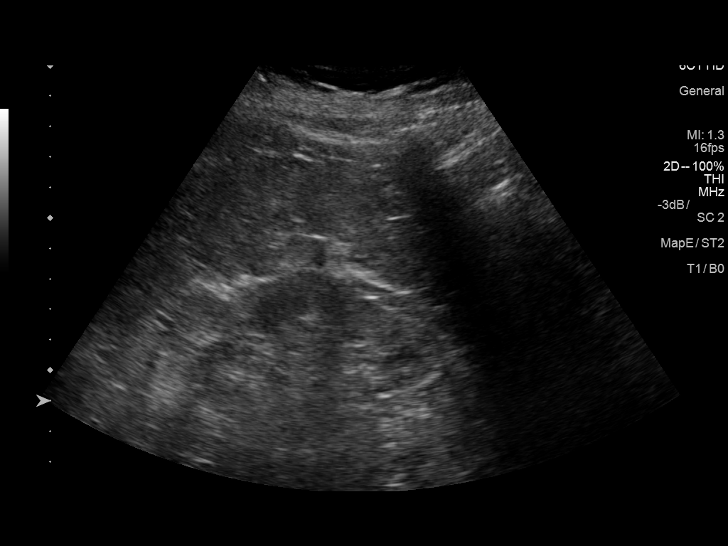
[im 13/39]
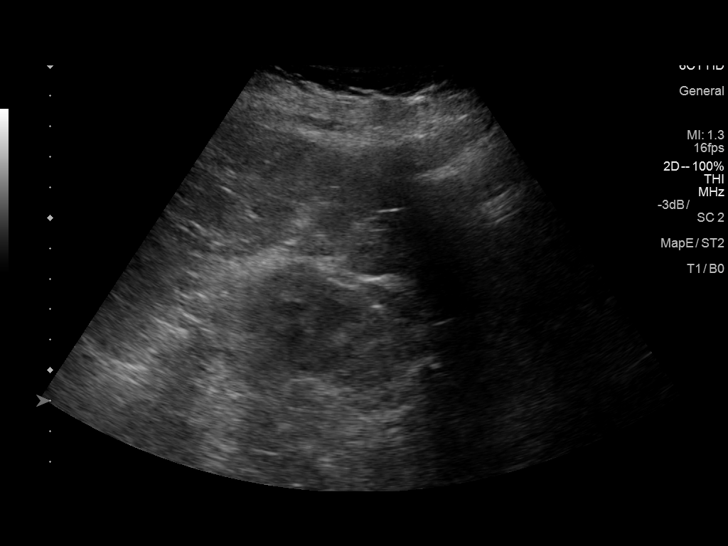
[im 15/39]
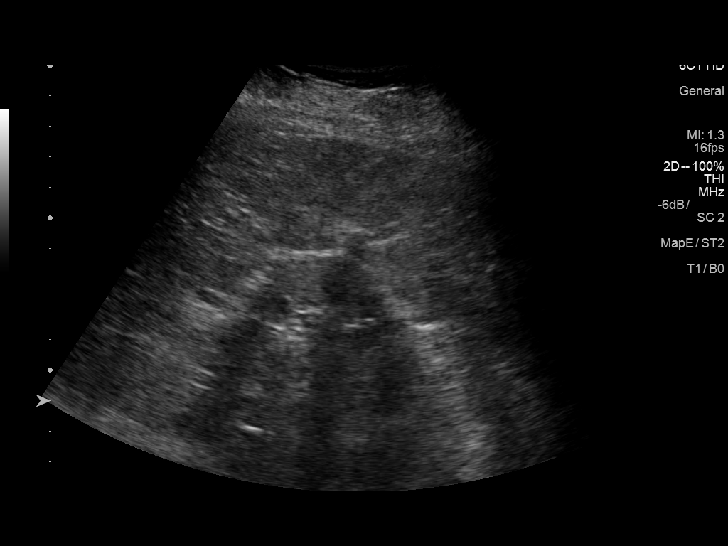
[im 18/39]
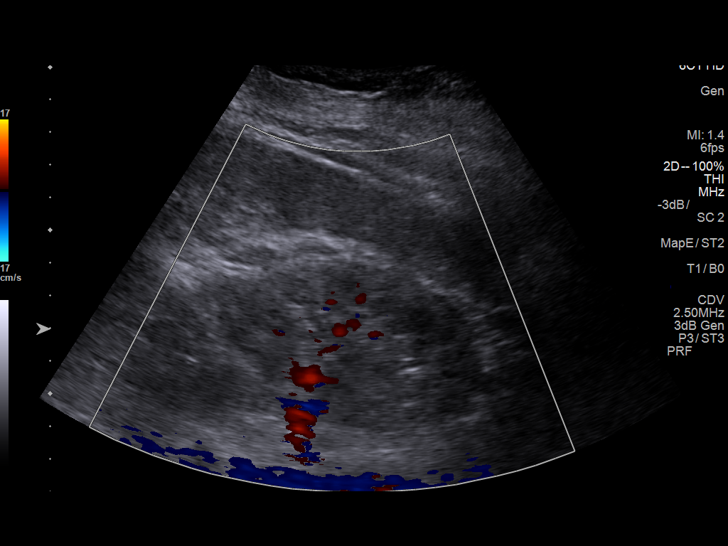
[im 21/39]
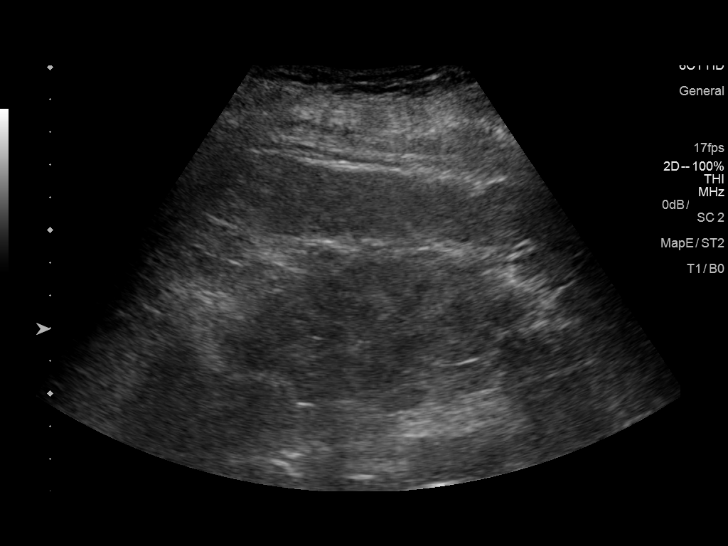
[im 24/39]
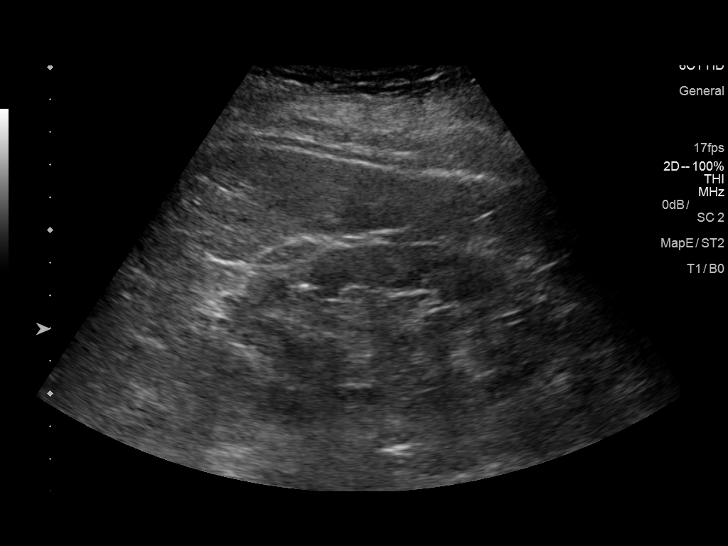
[im 26/39]
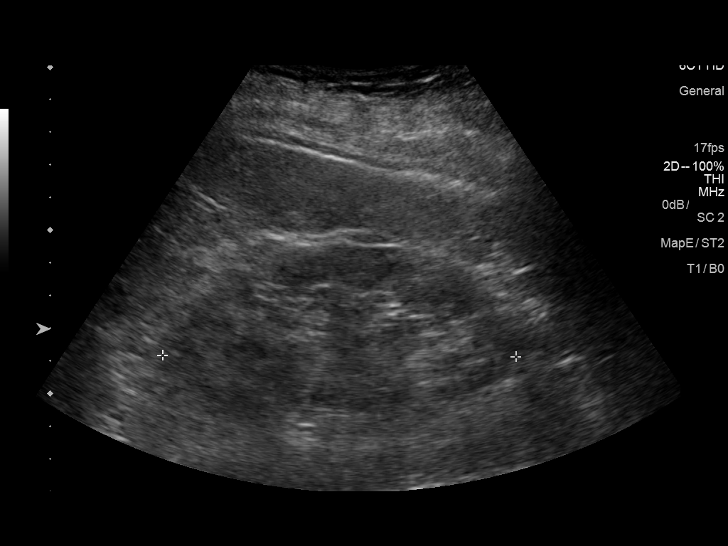
[im 29/39]
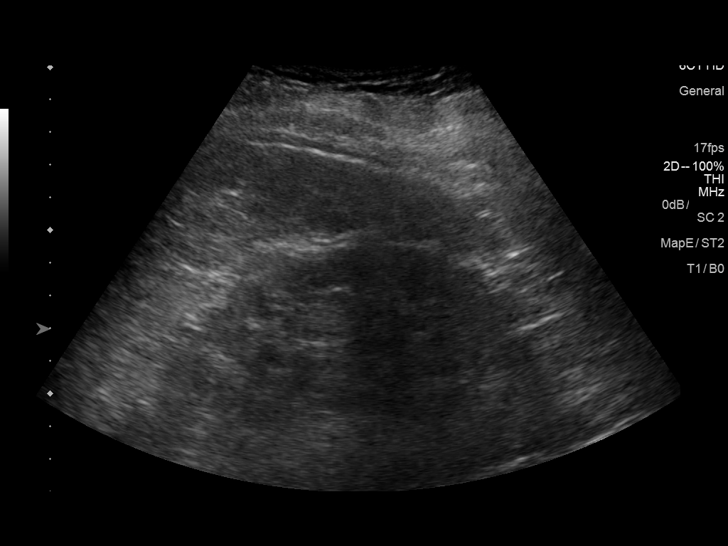
[im 32/39]
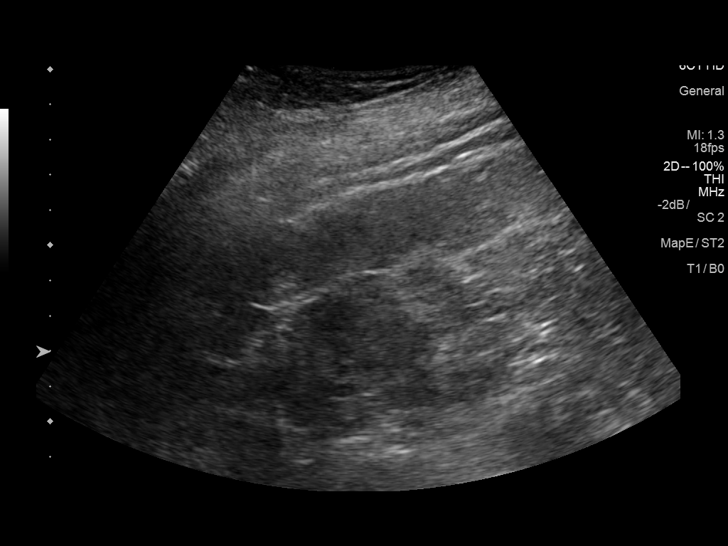
[im 35/39]
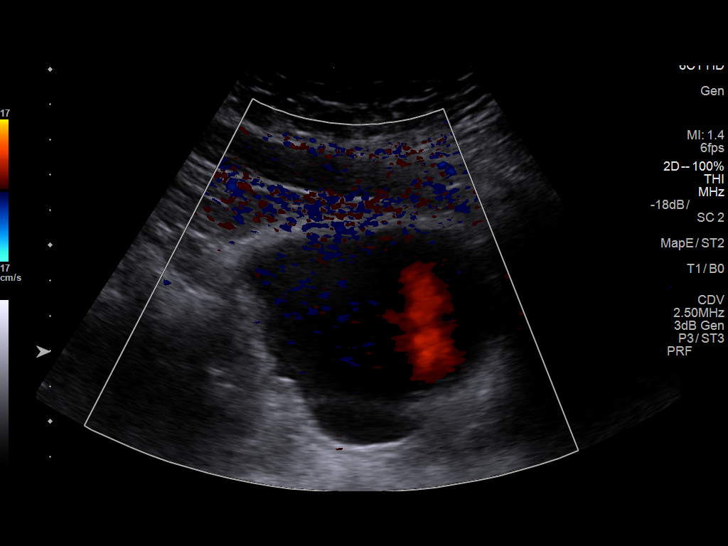
[im 39/39]
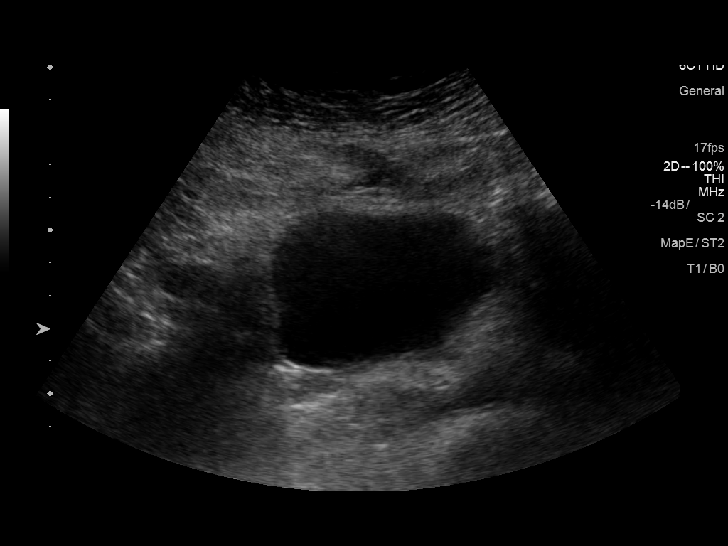

[14 of 25 positions shown; findings below may reference images not displayed]

FINDINGS: Right Kidney:

Length: 8.4 cm. Increased echogenicity. No hydronephrosis
visualized. 1.1 cm simple cyst right kidney.

Left Kidney:

Length: 10.8 cm. Increased echogenicity. No mass or hydronephrosis
visualized.

Bladder:

Appears normal for degree of bladder distention.
IMPRESSION: 1.  1.1 cm simple cyst right kidney.

2. Increased echogenicity both kidneys consistent chronic medical
renal disease.

## 2018-04-16 NOTE — Progress Notes (Deleted)
Cardiology Office Note:    Date:  04/16/2018   ID:  [name], DOB 29-Sep-1927, MRN [id number]  PCP:  [name], MD  Cardiologist:  Sinclair Grooms, MD   Referring MD: [name], MD   No chief complaint on file. ***  History of Present Illness:    Bradley [name] is a 84 y.o. male with a hx of   Past Medical History:  Diagnosis Date  . Arthritis   . BPH (benign prostatic hypertrophy)   . Cardiomyopathy ([place])    a. thought to be ischemic with high risk MPS EF 23%. Reluctant to do cath due to CKD  . Cataract   . Chronic kidney disease   . Dysrhythmia   . GERD (gastroesophageal reflux disease)   . Hypertension   . PPD positive    a. HX POSITIVE PPD WITH NEGATIVE CXR  . Status post placement of cardiac pacemaker    a. 04/2015: bradycardic arrest s/p Biotronik serial #[id number] pacemaker.  . Strabismus    a. right eye  . Symptomatic bradycardia    a. s/p Biotronik serial [id number] pacemaker.  . Syncope and collapse    a. in 2014 and again in 08/2014. Thought to be vasovagal   . Varicosities of leg     Past Surgical History:  Procedure Laterality Date  . CATARACT EXTRACTION W/ INTRAOCULAR LENS IMPLANT     BOTH EYES  . EP IMPLANTABLE DEVICE N/A 05/13/2015   Procedure: Pacemaker Implant;  Surgeon: Evans Lance, MD;  Location: [facility];  Service: Cardiovascular;  Laterality: N/A;  . ESOPHAGOGASTRODUODENOSCOPY (EGD) WITH PROPOFOL Left 10/04/2017   Procedure: ESOPHAGOGASTRODUODENOSCOPY (EGD) WITH PROPOFOL;  Surgeon: Ronnette Juniper, MD;  Location: [facility];  Service: Gastroenterology;  Laterality: Left;  . IR ANGIOGRAM SELECTIVE EACH ADDITIONAL VESSEL  10/04/2017  . IR ANGIOGRAM VISCERAL SELECTIVE  10/04/2017  . IR EMBO ART  VEN HEMORR LYMPH EXTRAV  INC GUIDE ROADMAPPING  10/04/2017  . IR US GUIDE VASC ACCESS RIGHT  10/04/2017  . TONSILLECTOMY     "I was young" (10/15/2012)  . TOTAL KNEE ARTHROPLASTY Left 12/01/2012   Procedure: LEFT TOTAL KNEE  ARTHROPLASTY;  Surgeon: [name], MD;  Location: [facility];  Service: Orthopedics;  Laterality: Left;  . TRANSURETHRAL RESECTION OF PROSTATE  2006    Current Medications: No outpatient medications have been marked as taking for the 04/18/18 encounter (Appointment) with Belva Crome, MD.     Allergies:   Patient has no known allergies.   Social History   Socioeconomic History  . Marital status: Married    Spouse name: Not on file  . Number of children: Not on file  . Years of education: Not on file  . Highest education level: Not on file  Occupational History  . Occupation: Retired  Scientific laboratory technician  . Financial resource strain: Not on file  . Food insecurity:    Worry: Not on file    Inability: Not on file  . Transportation needs:    Medical: Not on file    Non-medical: Not on file  Tobacco Use  . Smoking status: Never Smoker  . Smokeless tobacco: Never Used  Substance and Sexual Activity  . Alcohol use: No  . Drug use: No  . Sexual activity: Never  Lifestyle  . Physical activity:    Days per week: Not on file    Minutes per session: Not on file  . Stress: Not on file  Relationships  .  Social connections:    Talks on phone: Not on file    Gets together: Not on file    Attends religious service: Not on file    Active member of club or organization: Not on file    Attends meetings of clubs or organizations: Not on file    Relationship status: Not on file  Other Topics Concern  . Not on file  Social History Narrative   Lives with his wife.     Family History: The patient's ***family history includes Heart failure in his mother; Hypertension in his brother and mother; Pneumonia in his father. There is no history of Heart attack or Stroke.  ROS:   Please see the history of present illness.    *** All other systems reviewed and are negative.  EKGs/Labs/Other Studies Reviewed:    The following studies were reviewed today: ***  EKG:  EKG is *** ordered  today.  The ekg ordered today demonstrates ***  Recent Labs: 09/22/2017: B Natriuretic Peptide 141.5 10/03/2017: TSH 2.621 11/05/2017: ALT 24; Hemoglobin 10.7; Platelets 213 01/03/2018: BUN 42; Creatinine, Ser 2.63; Potassium 5.1; Sodium 138  Recent Lipid Panel    Component Value Date/Time   CHOL 162 10/16/2012 0510   TRIG 110 10/16/2012 0510   HDL 39 (L) 10/16/2012 0510   CHOLHDL 4.2 10/16/2012 0510   VLDL 22 10/16/2012 0510   LDLCALC 101 (H) 10/16/2012 0510    Physical Exam:    VS:  There were no vitals taken for this visit.    Wt Readings from Last 3 Encounters:  12/20/17 185 lb 8 oz (84.1 kg)  11/05/17 187 lb (84.8 kg)  10/10/17 190 lb 11.2 oz (86.5 kg)     GEN: *** Well nourished, well developed in no acute distress HEENT: Normal NECK: No JVD. LYMPHATICS: No lymphadenopathy CARDIAC: ***RRR, ***murmur, ***gallop, *** edema. VASCULAR: *** pulses. *** bruits. RESPIRATORY:  Clear to auscultation without rales, wheezing or rhonchi  ABDOMEN: Soft, non-tender, non-distended, No pulsatile mass, MUSCULOSKELETAL: No deformity  SKIN: Warm and dry NEUROLOGIC:  Alert and oriented x 3 PSYCHIATRIC:  Normal affect   ASSESSMENT:    1. Chronic combined systolic (congestive) and diastolic (congestive) heart failure ([place])   2. Chronic kidney disease, stage IV (severe) ([place])   3. Essential hypertension   4. Syncope and collapse   5. Symptomatic bradycardia   6. Status post placement of cardiac pacemaker    PLAN:    In order of problems listed above:  1. ***   Medication Adjustments/Labs and Tests Ordered: Current medicines are reviewed at length with the patient today.  Concerns regarding medicines are outlined above.  No orders of the defined types were placed in this encounter.  No orders of the defined types were placed in this encounter.   There are no Patient Instructions on file for this visit.   Signed, Sinclair Grooms, MD  04/16/2018 5:44 PM    [place]

## 2018-04-18 ENCOUNTER — Ambulatory Visit: Payer: Medicare Other | Admitting: Interventional Cardiology

## 2018-05-06 ENCOUNTER — Ambulatory Visit: Payer: Medicare Other | Admitting: Podiatry

## 2018-05-06 ENCOUNTER — Encounter: Payer: Self-pay | Admitting: Podiatry

## 2018-05-06 DIAGNOSIS — M79676 Pain in unspecified toe(s): Secondary | ICD-10-CM | POA: Diagnosis not present

## 2018-05-06 DIAGNOSIS — B351 Tinea unguium: Secondary | ICD-10-CM

## 2018-05-06 NOTE — Progress Notes (Signed)
Complaint:  Visit Type: Patient returns to my office for continued preventative foot care services. Complaint: Patient states" my nails have grown long and thick and become painful to walk and wear shoes" . The patient presents for preventative foot care services. No changes to ROS  Podiatric Exam: Vascular: dorsalis pedis and posterior tibial pulses are weakly  palpable bilateral. Capillary return is immediate. Temperature gradient is WNL. Skin turgor WNL  Sensorium: Normal Semmes Weinstein monofilament test. Normal tactile sensation bilaterally. Nail Exam: Pt has thick disfigured discolored nails with subungual debris noted bilateral entire nail hallux through fifth toenails Ulcer Exam: There is no evidence of ulcer or pre-ulcerative changes or infection. Orthopedic Exam: Muscle tone and strength are WNL. No limitations in general ROM. No crepitus or effusions noted. Severe  HAV  B/L.  Hammer toe  B/L Skin: No Porokeratosis. No infection or ulcers  Diagnosis:  Onychomycosis, , Pain in right toe, pain in left toes  Treatment & Plan Procedures and Treatment: Consent by patient was obtained for treatment procedures.   Debridement of mycotic and hypertrophic toenails, 1 through 5 bilateral and clearing of subungual debris. No ulceration, no infection noted.  Return Visit-Office Procedure: Patient instructed to return to the office for a follow up visit 3 months for continued evaluation and treatment.    Gardiner Barefoot DPM

## 2018-05-13 ENCOUNTER — Ambulatory Visit (INDEPENDENT_AMBULATORY_CARE_PROVIDER_SITE_OTHER): Payer: Medicare Other | Admitting: *Deleted

## 2018-05-13 DIAGNOSIS — I495 Sick sinus syndrome: Secondary | ICD-10-CM

## 2018-05-14 NOTE — Progress Notes (Signed)
Remote pacemaker transmission.   

## 2018-05-22 ENCOUNTER — Encounter: Payer: Self-pay | Admitting: Interventional Cardiology

## 2018-06-20 ENCOUNTER — Ambulatory Visit: Payer: Medicare Other | Admitting: Interventional Cardiology

## 2018-06-24 ENCOUNTER — Encounter: Payer: Self-pay | Admitting: Nurse Practitioner

## 2018-06-24 ENCOUNTER — Ambulatory Visit: Payer: Medicare Other | Admitting: Nurse Practitioner

## 2018-06-24 VITALS — BP 110/60 | HR 65 | Ht 73.0 in | Wt 187.8 lb

## 2018-06-24 DIAGNOSIS — I10 Essential (primary) hypertension: Secondary | ICD-10-CM

## 2018-06-24 DIAGNOSIS — I5022 Chronic systolic (congestive) heart failure: Secondary | ICD-10-CM

## 2018-06-24 DIAGNOSIS — I2510 Atherosclerotic heart disease of native coronary artery without angina pectoris: Secondary | ICD-10-CM

## 2018-06-24 NOTE — Patient Instructions (Addendum)
We will be checking the following labs today - NONE   Medication Instructions:    Continue with your current medicines.    If you need a refill on your cardiac medications before your next appointment, please call your pharmacy.     Testing/Procedures To Be Arranged:  N/A  Follow-Up:   See Dr. Tamala Julian in March  Needs EP follow up with Dr. Lovena Le   At [facility], you and your health needs are our priority.  As part of our continuing mission to provide you with exceptional heart care, we have created designated Provider Care Teams.  These Care Teams include your primary Cardiologist (physician) and Advanced Practice Providers (APPs -  Physician Assistants and Nurse Practitioners) who all work together to provide you with the care you need, when you need it.  Special Instructions:  . Keep up the good work!  Call the [place] office at [phone] if you have any questions, problems or concerns.

## 2018-06-24 NOTE — Progress Notes (Signed)
CARDIOLOGY OFFICE NOTE  Date:  06/24/2018    Bradley [name] Date of Birth: [birth year] Medical Record #[id number]  PCP:  [name], MD  Cardiologist:  Tamala Julian    Chief Complaint  Patient presents with  . Coronary Artery Disease  . Congestive Heart Failure    Follow up visit - seen for Dr. Tamala Julian    History of Present Illness: Bradley [name] is a 84 y.o. male who presents today for a follow up visit. Seen for Dr. Tamala Julian. Sees Dr. Lovena Le for EP.   He has a history of CAD, chronic systolic HF, CKD, hypertension and has underlying PPM in place. He has had prior GI bleed with associated NSTEMI in 09/2017 - he is managed medically due to his advanced age and GI bleed.Discharged on aspirin, beta-blocker and Imdur. Not a candidate for Plavix with GI bleed. LVEF 25% on echo treated with carvedilol and Imdur and placed back on hydralazine as an outpatient.  He has had worsening kidney function - lasix has been decreased along with Coreg with some improvement.   Last seen by Cecilie Kicks, NP in May - he was doing ok except for some constipation. He was losing weight.   Comes in today. Here alone. Says "I'm surviving". Feels ok for the most part. Weight is stable. His wife makes sure he weighs every morning. Not dizzy or lightheaded. No fainting. Breathing is ok. No chest pain noted.  Seems more bothered by urinary frequency. Has not had his EP follow up. Labs just checked by Renal.   Past Medical History:  Diagnosis Date  . Arthritis   . BPH (benign prostatic hypertrophy)   . Cardiomyopathy ([place])    a. thought to be ischemic with high risk MPS EF 23%. Reluctant to do cath due to CKD  . Cataract   . Chronic kidney disease   . Dysrhythmia   . GERD (gastroesophageal reflux disease)   . Hypertension   . PPD positive    a. HX POSITIVE PPD WITH NEGATIVE CXR  . Status post placement of cardiac pacemaker    a. 04/2015: bradycardic arrest s/p Biotronik serial #[id number]  pacemaker.  . Strabismus    a. right eye  . Symptomatic bradycardia    a. s/p Biotronik serial [id number] pacemaker.  . Syncope and collapse    a. in 2014 and again in 08/2014. Thought to be vasovagal   . Varicosities of leg     Past Surgical History:  Procedure Laterality Date  . CATARACT EXTRACTION W/ INTRAOCULAR LENS IMPLANT     BOTH EYES  . EP IMPLANTABLE DEVICE N/A 05/13/2015   Procedure: Pacemaker Implant;  Surgeon: Evans Lance, MD;  Location: [facility];  Service: Cardiovascular;  Laterality: N/A;  . ESOPHAGOGASTRODUODENOSCOPY (EGD) WITH PROPOFOL Left 10/04/2017   Procedure: ESOPHAGOGASTRODUODENOSCOPY (EGD) WITH PROPOFOL;  Surgeon: Ronnette Juniper, MD;  Location: [facility];  Service: Gastroenterology;  Laterality: Left;  . IR ANGIOGRAM SELECTIVE EACH ADDITIONAL VESSEL  10/04/2017  . IR ANGIOGRAM VISCERAL SELECTIVE  10/04/2017  . IR EMBO ART  VEN HEMORR LYMPH EXTRAV  INC GUIDE ROADMAPPING  10/04/2017  . IR US GUIDE VASC ACCESS RIGHT  10/04/2017  . TONSILLECTOMY     "I was young" (10/15/2012)  . TOTAL KNEE ARTHROPLASTY Left 12/01/2012   Procedure: LEFT TOTAL KNEE ARTHROPLASTY;  Surgeon: [name], MD;  Location: [facility];  Service: Orthopedics;  Laterality: Left;  . TRANSURETHRAL RESECTION OF PROSTATE  2006  Medications: Current Meds  Medication Sig  . acetaminophen (TYLENOL) 500 MG tablet Take 500 mg by mouth at bedtime as needed for headache (pain).  Marland Kitchen aspirin EC 81 MG tablet Take 1 tablet (81 mg total) by mouth daily. If hemoglobin is stable  . carvedilol (COREG) 6.25 MG tablet Take 6.25 mg by mouth 2 (two) times daily with a meal.   . cholecalciferol (VITAMIN D) 1000 UNITS tablet Take 1,000 Units by mouth 2 (two) times daily.  Marland Kitchen docusate sodium (COLACE) 100 MG capsule Take 100 mg by mouth 2 (two) times daily as needed for mild constipation.  . dorzolamide-timolol (COSOPT) 22.3-6.8 MG/ML ophthalmic solution Place 1 drop into both eyes 2 (two) times daily.   .  furosemide (LASIX) 40 MG tablet Take 1 tablet (40 mg total) by mouth every other day.  . latanoprost (XALATAN) 0.005 % ophthalmic solution Place 1 drop into both eyes at bedtime.  . Multiple Vitamin (MULTIVITAMIN WITH MINERALS) TABS tablet Take 1 tablet by mouth daily.  . nitroGLYCERIN (NITROSTAT) 0.4 MG SL tablet Place 0.4 mg under the tongue every 5 (five) minutes as needed for chest pain (max 3 doses within 15 minutes call 911).  . pantoprazole (PROTONIX) 40 MG tablet Take 1 tablet (40 mg total) by mouth 2 (two) times daily.     Allergies: No Known Allergies  Social History: The patient  reports that he has never smoked. He has never used smokeless tobacco. He reports that he does not drink alcohol or use drugs.   Family History: The patient's family history includes Heart failure in his mother; Hypertension in his brother and mother; Pneumonia in his father.   Review of Systems: Please see the history of present illness.   Otherwise, the review of systems is positive for none.   All other systems are reviewed and negative.   Physical Exam: VS:  BP 110/60 (BP Location: Left Arm, Patient Position: Sitting, Cuff Size: Normal)   Pulse 65   Ht 6\' 1"  (1.854 m)   Wt 187 lb 12.8 oz (85.2 kg)   SpO2 100% Comment: at rest  BMI 24.78 kg/m  .  BMI Body mass index is 24.78 kg/m.  Wt Readings from Last 3 Encounters:  06/24/18 187 lb 12.8 oz (85.2 kg)  12/20/17 185 lb 8 oz (84.1 kg)  11/05/17 187 lb (84.8 kg)    General: Elderly. Very dapper in appearance - he is alert and in no acute distress.   HEENT: Normal. Poor dentition.  Neck: Supple, no JVD, carotid bruits, or masses noted.  Cardiac: Regular rate and rhythm. No murmurs, rubs, or gallops. No edema.  Respiratory:  Lungs are clear to auscultation bilaterally with normal work of breathing.  GI: Soft and nontender.  MS: No deformity or atrophy. Gait and ROM intact.  Skin: Warm and dry. Color is normal.  Neuro:  Strength and  sensation are intact and no gross focal deficits noted.  Psych: Alert, appropriate and with normal affect.   LABORATORY DATA:  EKG:  EKG is not ordered today.  Lab Results  Component Value Date   WBC 6.9 11/05/2017   HGB 10.7 (L) 11/05/2017   HCT 32.7 (L) 11/05/2017   PLT 213 11/05/2017   GLUCOSE 91 01/03/2018   CHOL 162 10/16/2012   TRIG 110 10/16/2012   HDL 39 (L) 10/16/2012   LDLCALC 101 (H) 10/16/2012   ALT 24 11/05/2017   AST 19 11/05/2017   NA 138 01/03/2018   K 5.1 01/03/2018  CL 106 01/03/2018   CREATININE 2.63 (H) 01/03/2018   BUN 42 (H) 01/03/2018   CO2 20 01/03/2018   TSH 2.621 10/03/2017   INR 1.20 10/03/2017   HGBA1C 5.6 05/12/2015       BNP (last 3 results) Recent Labs    09/22/17 2121  BNP 141.5*    ProBNP (last 3 results) No results for input(s): PROBNP in the last 8760 hours.   Other Studies Reviewed Today:  Echo 09/23/17 Study Conclusions  - Left ventricle: LVEF is severely depressed at approximately 30% with diffuse hypokinesis; inferior akinesis.. The cavity size was normal. Wall thickness was increased in a pattern of mild LVH. Systolic function was severely reduced. The estimated ejection fraction was in the range of 25% to 30%. Doppler parameters are consistent with abnormal left ventricular relaxation (grade 1 diastolic dysfunction). - Aortic valve: There was mild regurgitation. - Mitral valve: Calcified annulus. Mildly thickened leaflets . - Left atrium: The atrium was mildly dilated.  Impressions:  - NO significant change in LV function compared to report of 2016.  Carotid Dopplers with 1-39% bilateral stenosis 09/23/17  Myoview 08/2014 Overall Impression: High risk stress nuclear study with a large scar in the entire inferior, basal and mid inferolateral wall with no ischemia. .  LV Ejection Fraction: 23%. LV Wall Motion: Severely decreased LVEF with diffuse hypokinesis and akinesis of the entire  inferior and inferolateral walls.  Notes Recorded by Sinclair Grooms, MD on 08/30/2014 at 7:16 PM Needs to know that the scan showed evidence of a heart attack. Not sure when. Heart is weak. Start Imdur 60 mg daily. Start Carvedilol 3.125 mg BID. OV with me in 7 days.    Assessment/Plan:  1. Ischemic CM/CAD - prior NSTEMI 09/2017 in setting of anemia/GI bleed - managed medically - he is doing rather well. No active symptoms - continue with supportive care. No changes made today.   2. Chronic systolic HF - weight is stable. No active symptoms. No changes made today.   3. CKD - now followed by Dr. Hollie Salk - has just had labs.   4. Underlying PPM - does not look like he has had his follow up with EP - will arrange.   5. HTN - BP is great - no changes made today   6. Advanced age - certainly holding his own at this time.    Current medicines are reviewed with the patient today.  The patient does not have concerns regarding medicines other than what has been noted above.  The following changes have been made:  See above.  Labs/ tests ordered today include:   No orders of the defined types were placed in this encounter.    Disposition:   FU with Dr.Smith in March - I am happy to see back - will arrange EP follow up as well.   Patient is agreeable to this plan and will call if any problems develop in the interim.   SignedTruitt Merle, NP  06/24/2018 11:12 AM  [facility] [address] [place], [place]  [address] Phone: [phone] Fax: [phone]

## 2018-07-14 IMAGING — CT CT ABD-PELV W/O CM
2 of 4 series · 16 of 46 positions shown, 18 images · non-contrast
Comparison: Renal ultrasound dated 09/27/2016

CLINICAL DATA: 89-year-old male with right lower quadrant abdominal
pain.

EXAM:
CT ABDOMEN AND PELVIS WITHOUT CONTRAST
TECHNIQUE: Multidetector CT imaging of the abdomen and pelvis was performed
following the standard protocol without IV contrast.

[Series 3: ap without · axial · non-contrast · 0.80mm/px · z∈[+504,+958]mm · 13 of 103 slices shown, 15 images]
[im 6/103  soft-tissue]
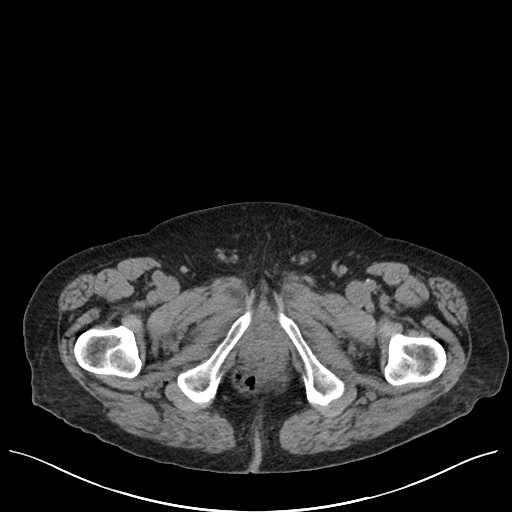
[im 6/103  bone]
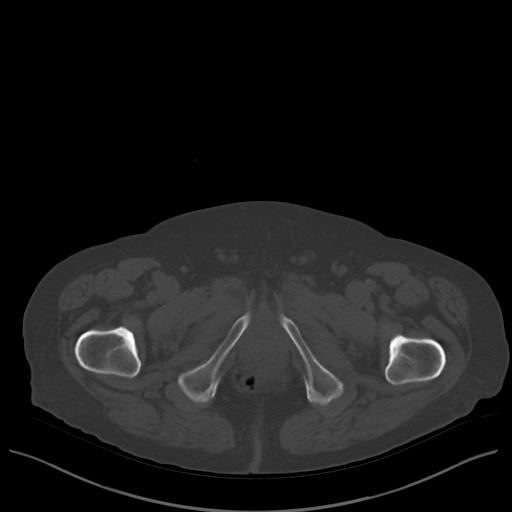
[im 17/103  soft-tissue]
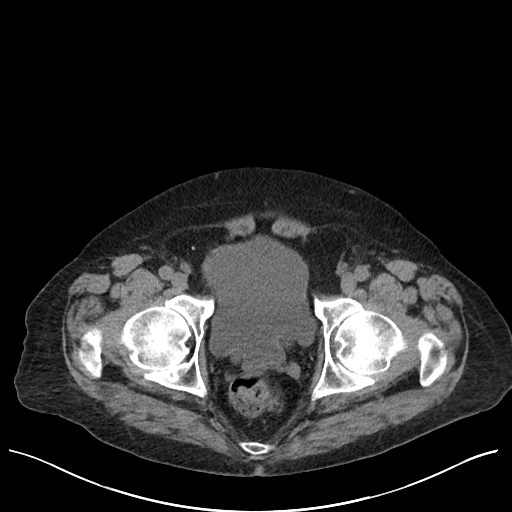
[im 22/103  soft-tissue]
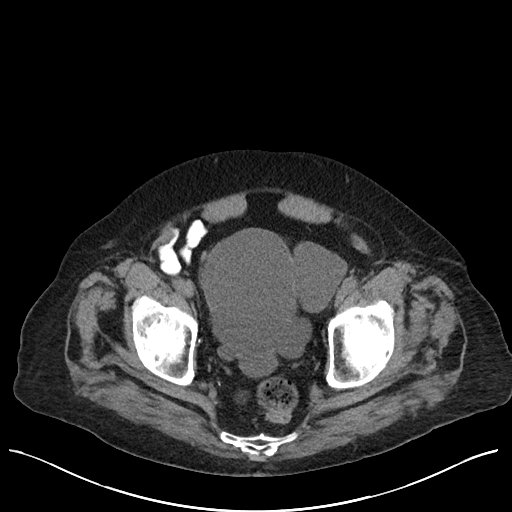
[im 27/103  soft-tissue]
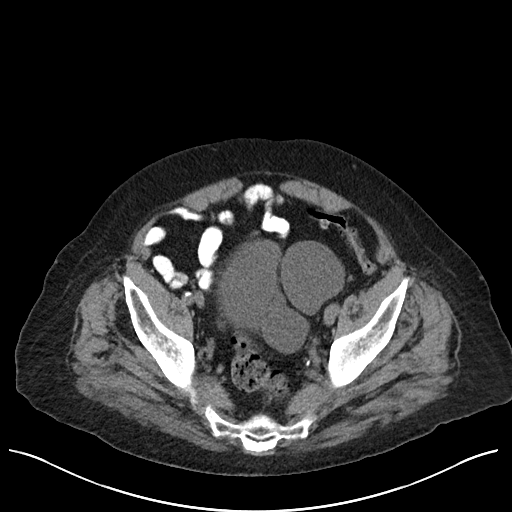
[im 38/103  soft-tissue]
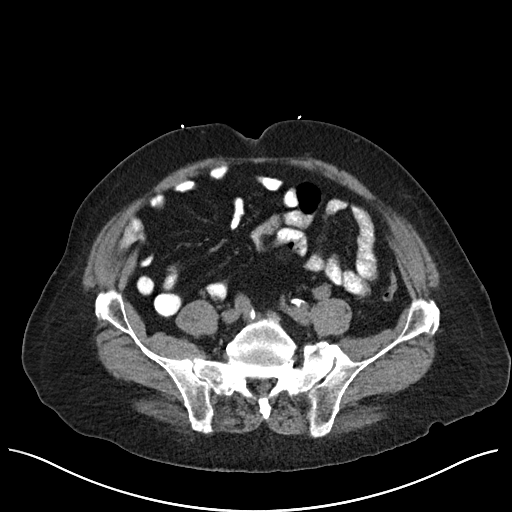
[im 43/103  soft-tissue]
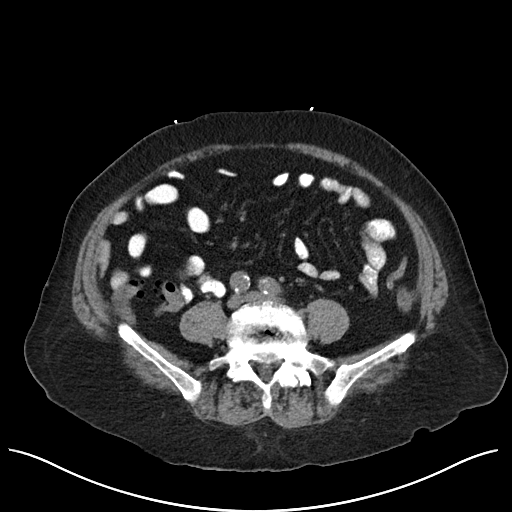
[im 54/103  soft-tissue]
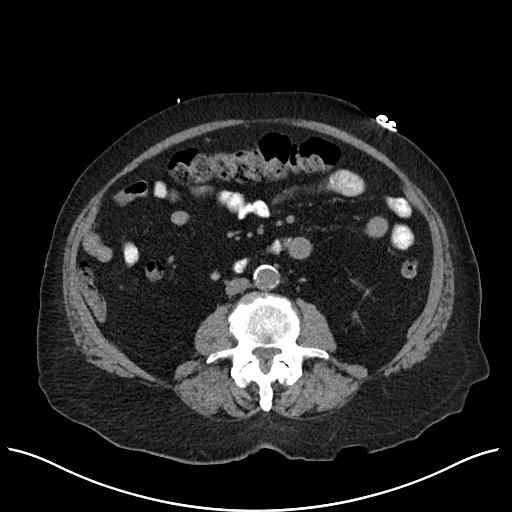
[im 60/103  soft-tissue]
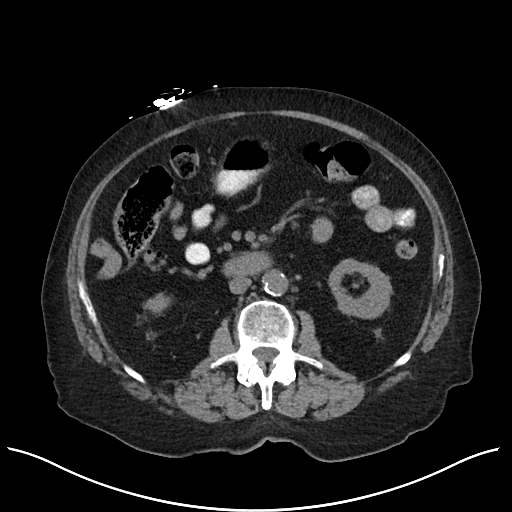
[im 65/103  soft-tissue]
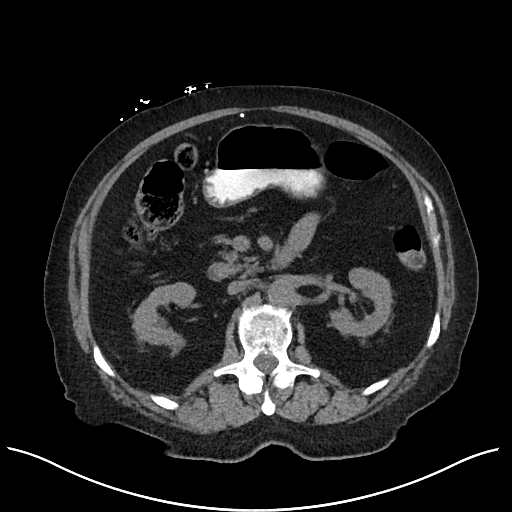
[im 65/103  bone]
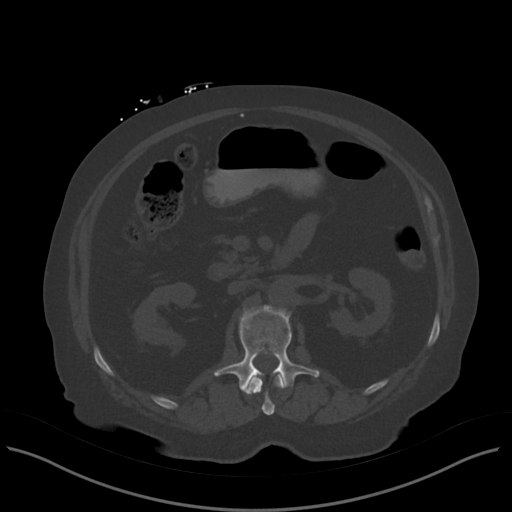
[im 76/103  soft-tissue]
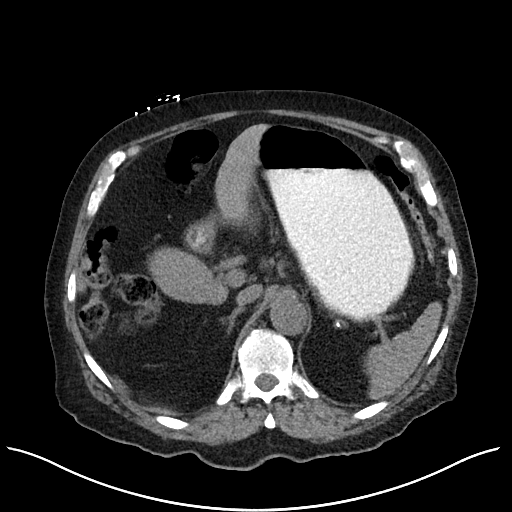
[im 81/103  soft-tissue]
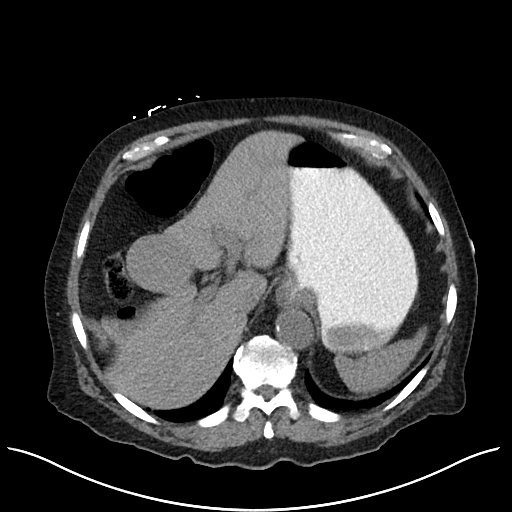
[im 86/103  soft-tissue]
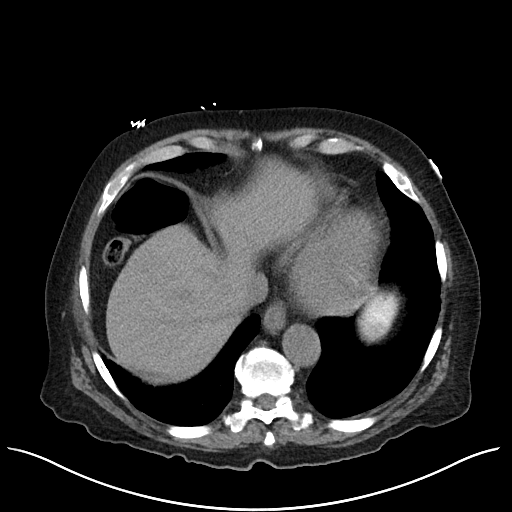
[im 97/103  soft-tissue]
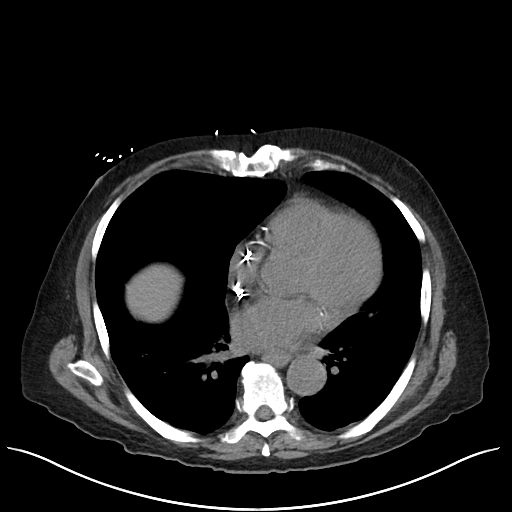

[Series 6: cor · coronal · 0.85mm/px · 3 of 94 slices shown]
[im 32/94  soft-tissue]
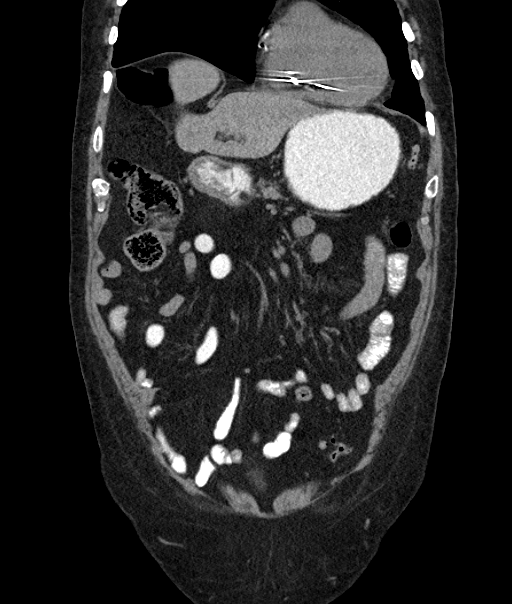
[im 42/94  soft-tissue]
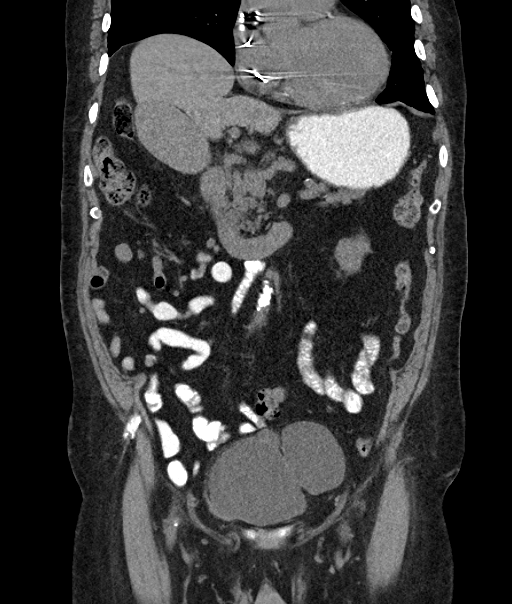
[im 52/94  soft-tissue]
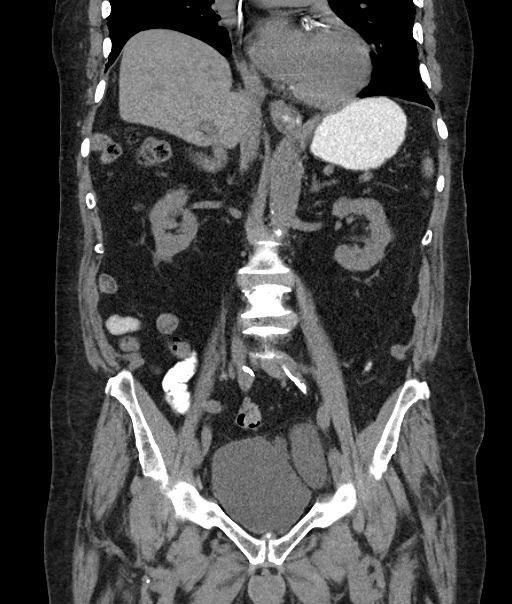

[16 of 46 positions shown; findings below may reference images not displayed]

FINDINGS: Evaluation of this exam is limited in the absence of intravenous
contrast.

Lower chest: Minimal bibasilar linear atelectasis/scarring. The
visualized lung bases are otherwise clear. There is coronary
vascular calcification and cardiac pacemaker wire.

No intra-abdominal free air or free fluid.

Hepatobiliary: The liver is unremarkable. No intrahepatic biliary
ductal dilatation. Small gallstones. No pericholecystic fluid or
evidence of acute cholecystitis by CT.

Pancreas: Unremarkable. No pancreatic ductal dilatation or
surrounding inflammatory changes.

Spleen: Normal in size without focal abnormality.

Adrenals/Urinary Tract: Mild bilateral renal parenchyma atrophy.
Small bilateral exophytic hypodense lesions are not characterized on
this noncontrast CT. There is no hydronephrosis or nephrolithiasis
on either side. The visualized ureters are unremarkable. There is
trabeculation of the bladder wall with multiple diverticula
measuring up to 5 cm from the left lateral bladder wall.

Stomach/Bowel: There is no bowel obstruction or active inflammation.
There are scattered sigmoid, chronic, and distal small bowel
diverticula without active inflammatory changes. Normal appendix.

Vascular/Lymphatic: Moderate aortoiliac atherosclerotic disease. No
portal venous gas. There is no adenopathy.

Reproductive: The prostate and seminal vesicles are grossly
unremarkable. No pelvic mass

Other: Small fat containing umbilical hernia.

Musculoskeletal: Degenerative changes of the spine as well as
multilevel disc desiccation. No acute osseous pathology.
IMPRESSION: 1. Cholelithiasis.
2. Colonic and distal small bowel diverticulosis. No bowel
obstruction or active inflammation. Normal appendix.
3. Trabeculated urinary bladder with multiple large diverticula.

## 2018-07-19 LAB — CUP PACEART REMOTE DEVICE CHECK
Date Time Interrogation Session: 20191221201048
Implantable Lead Implant Date: 20161016
Implantable Lead Location: 753859
Implantable Lead Location: 753860
Implantable Lead Model: 377
Implantable Lead Serial Number: 49267833
Implantable Lead Serial Number: 49296158
MDC IDC LEAD IMPLANT DT: 20161016
MDC IDC PG IMPLANT DT: 20161016
MDC IDC PG SERIAL: 68596848
Pulse Gen Model: 394969

## 2018-08-05 ENCOUNTER — Ambulatory Visit (INDEPENDENT_AMBULATORY_CARE_PROVIDER_SITE_OTHER): Payer: Medicare Other | Admitting: Podiatry

## 2018-08-05 ENCOUNTER — Encounter: Payer: Self-pay | Admitting: Podiatry

## 2018-08-05 DIAGNOSIS — B351 Tinea unguium: Secondary | ICD-10-CM | POA: Diagnosis not present

## 2018-08-05 DIAGNOSIS — M79605 Pain in left leg: Secondary | ICD-10-CM

## 2018-08-05 DIAGNOSIS — M79676 Pain in unspecified toe(s): Secondary | ICD-10-CM | POA: Diagnosis not present

## 2018-08-05 DIAGNOSIS — M79604 Pain in right leg: Secondary | ICD-10-CM

## 2018-08-05 NOTE — Progress Notes (Signed)
Complaint:  Visit Type: Patient returns to my office for continued preventative foot care services. Complaint: Patient states" my nails have grown long and thick and become painful to walk and wear shoes" . The patient presents for preventative foot care services. No changes to ROS  Podiatric Exam: Vascular: dorsalis pedis and posterior tibial pulses are weakly  palpable bilateral. Capillary return is immediate. Temperature gradient is WNL. Skin turgor WNL  Sensorium: Normal Semmes Weinstein monofilament test. Normal tactile sensation bilaterally. Nail Exam: Pt has thick disfigured discolored nails with subungual debris noted bilateral entire nail hallux through fifth toenails Ulcer Exam: There is no evidence of ulcer or pre-ulcerative changes or infection. Orthopedic Exam: Muscle tone and strength are WNL. No limitations in general ROM. No crepitus or effusions noted. Severe  HAV  B/L.  Hammer toe  B/L Skin: No Porokeratosis. No infection or ulcers  Diagnosis:  Onychomycosis, , Pain in right toe, pain in left toes  Treatment & Plan Procedures and Treatment: Consent by patient was obtained for treatment procedures.   Debridement of mycotic and hypertrophic toenails, 1 through 5 bilateral and clearing of subungual debris. No ulceration, no infection noted. Debridement of interdigital degris performed. Return Visit-Office Procedure: Patient instructed to return to the office for a follow up visit 3 months for continued evaluation and treatment.    Gardiner Barefoot DPM

## 2018-08-10 ENCOUNTER — Inpatient Hospital Stay (HOSPITAL_COMMUNITY)
Admission: RE | Admit: 2018-08-10 | Discharge: 2018-08-30 | DRG: 444 | Disposition: E | Payer: Medicare Other | Source: Ambulatory Visit | Attending: Family Medicine | Admitting: Family Medicine

## 2018-08-10 ENCOUNTER — Other Ambulatory Visit: Payer: Self-pay

## 2018-08-10 ENCOUNTER — Encounter (HOSPITAL_COMMUNITY): Payer: Self-pay

## 2018-08-10 DIAGNOSIS — I495 Sick sinus syndrome: Secondary | ICD-10-CM | POA: Diagnosis present

## 2018-08-10 DIAGNOSIS — Z66 Do not resuscitate: Secondary | ICD-10-CM | POA: Diagnosis present

## 2018-08-10 DIAGNOSIS — K810 Acute cholecystitis: Secondary | ICD-10-CM

## 2018-08-10 DIAGNOSIS — R079 Chest pain, unspecified: Secondary | ICD-10-CM

## 2018-08-10 DIAGNOSIS — R0603 Acute respiratory distress: Secondary | ICD-10-CM

## 2018-08-10 DIAGNOSIS — N320 Bladder-neck obstruction: Secondary | ICD-10-CM | POA: Diagnosis present

## 2018-08-10 DIAGNOSIS — K819 Cholecystitis, unspecified: Secondary | ICD-10-CM

## 2018-08-10 DIAGNOSIS — Z8711 Personal history of peptic ulcer disease: Secondary | ICD-10-CM

## 2018-08-10 DIAGNOSIS — K8020 Calculus of gallbladder without cholecystitis without obstruction: Principal | ICD-10-CM | POA: Diagnosis present

## 2018-08-10 DIAGNOSIS — I2510 Atherosclerotic heart disease of native coronary artery without angina pectoris: Secondary | ICD-10-CM | POA: Diagnosis present

## 2018-08-10 DIAGNOSIS — I5042 Chronic combined systolic (congestive) and diastolic (congestive) heart failure: Secondary | ICD-10-CM | POA: Diagnosis present

## 2018-08-10 DIAGNOSIS — Z8249 Family history of ischemic heart disease and other diseases of the circulatory system: Secondary | ICD-10-CM

## 2018-08-10 DIAGNOSIS — N179 Acute kidney failure, unspecified: Secondary | ICD-10-CM | POA: Diagnosis present

## 2018-08-10 DIAGNOSIS — R52 Pain, unspecified: Secondary | ICD-10-CM

## 2018-08-10 DIAGNOSIS — E860 Dehydration: Secondary | ICD-10-CM | POA: Diagnosis not present

## 2018-08-10 DIAGNOSIS — R338 Other retention of urine: Secondary | ICD-10-CM | POA: Diagnosis present

## 2018-08-10 DIAGNOSIS — I130 Hypertensive heart and chronic kidney disease with heart failure and stage 1 through stage 4 chronic kidney disease, or unspecified chronic kidney disease: Secondary | ICD-10-CM | POA: Diagnosis present

## 2018-08-10 DIAGNOSIS — J690 Pneumonitis due to inhalation of food and vomit: Secondary | ICD-10-CM | POA: Diagnosis not present

## 2018-08-10 DIAGNOSIS — F0390 Unspecified dementia without behavioral disturbance: Secondary | ICD-10-CM | POA: Diagnosis present

## 2018-08-10 DIAGNOSIS — J189 Pneumonia, unspecified organism: Secondary | ICD-10-CM

## 2018-08-10 DIAGNOSIS — K8000 Calculus of gallbladder with acute cholecystitis without obstruction: Secondary | ICD-10-CM | POA: Diagnosis present

## 2018-08-10 DIAGNOSIS — I255 Ischemic cardiomyopathy: Secondary | ICD-10-CM | POA: Diagnosis present

## 2018-08-10 DIAGNOSIS — Z96652 Presence of left artificial knee joint: Secondary | ICD-10-CM | POA: Diagnosis present

## 2018-08-10 DIAGNOSIS — Z950 Presence of cardiac pacemaker: Secondary | ICD-10-CM

## 2018-08-10 DIAGNOSIS — I252 Old myocardial infarction: Secondary | ICD-10-CM

## 2018-08-10 DIAGNOSIS — E872 Acidosis: Secondary | ICD-10-CM | POA: Diagnosis present

## 2018-08-10 DIAGNOSIS — K920 Hematemesis: Secondary | ICD-10-CM | POA: Diagnosis present

## 2018-08-10 DIAGNOSIS — K56609 Unspecified intestinal obstruction, unspecified as to partial versus complete obstruction: Secondary | ICD-10-CM | POA: Diagnosis not present

## 2018-08-10 DIAGNOSIS — J181 Lobar pneumonia, unspecified organism: Secondary | ICD-10-CM

## 2018-08-10 DIAGNOSIS — R109 Unspecified abdominal pain: Secondary | ICD-10-CM

## 2018-08-10 DIAGNOSIS — N184 Chronic kidney disease, stage 4 (severe): Secondary | ICD-10-CM | POA: Diagnosis present

## 2018-08-10 DIAGNOSIS — I959 Hypotension, unspecified: Secondary | ICD-10-CM | POA: Diagnosis not present

## 2018-08-10 DIAGNOSIS — N3000 Acute cystitis without hematuria: Secondary | ICD-10-CM | POA: Diagnosis present

## 2018-08-10 LAB — CBC
HCT: 32.9 % — ABNORMAL LOW (ref 39.0–52.0)
Hemoglobin: 9.8 g/dL — ABNORMAL LOW (ref 13.0–17.0)
MCH: 28.1 pg (ref 26.0–34.0)
MCHC: 29.8 g/dL — ABNORMAL LOW (ref 30.0–36.0)
MCV: 94.3 fL (ref 80.0–100.0)
PLATELETS: 179 10*3/uL (ref 150–400)
RBC: 3.49 MIL/uL — ABNORMAL LOW (ref 4.22–5.81)
RDW: 12.9 % (ref 11.5–15.5)
WBC: 6.8 10*3/uL (ref 4.0–10.5)
nRBC: 0 % (ref 0.0–0.2)

## 2018-08-10 MED ORDER — ONDANSETRON HCL 4 MG/2ML IJ SOLN
4.0000 mg | Freq: Once | INTRAMUSCULAR | Status: AC
  Administered 2018-08-10: 4 mg via INTRAVENOUS
  Filled 2018-08-10: qty 2

## 2018-08-10 MED ORDER — FENTANYL CITRATE (PF) 100 MCG/2ML IJ SOLN
50.0000 ug | Freq: Once | INTRAMUSCULAR | Status: AC
  Administered 2018-08-10: 50 ug via INTRAVENOUS
  Filled 2018-08-10: qty 2

## 2018-08-10 NOTE — ED Notes (Signed)
Bed: OU61 Expected date:  Expected time:  Means of arrival:  Comments: EMS 85 yo male vomiting after eating dinner at 1800

## 2018-08-10 NOTE — ED Triage Notes (Signed)
Pt reports eating dinner around 6p and having 2 episodes of emesis after. Denies nausea now, but endorses diffuse abdominal pain. A&Ox4.

## 2018-08-11 ENCOUNTER — Other Ambulatory Visit: Payer: Self-pay

## 2018-08-11 ENCOUNTER — Emergency Department (HOSPITAL_COMMUNITY): Payer: Medicare Other

## 2018-08-11 DIAGNOSIS — I2510 Atherosclerotic heart disease of native coronary artery without angina pectoris: Secondary | ICD-10-CM

## 2018-08-11 DIAGNOSIS — I5042 Chronic combined systolic (congestive) and diastolic (congestive) heart failure: Secondary | ICD-10-CM | POA: Diagnosis not present

## 2018-08-11 DIAGNOSIS — K8000 Calculus of gallbladder with acute cholecystitis without obstruction: Secondary | ICD-10-CM | POA: Diagnosis present

## 2018-08-11 DIAGNOSIS — N184 Chronic kidney disease, stage 4 (severe): Secondary | ICD-10-CM | POA: Diagnosis not present

## 2018-08-11 LAB — TYPE AND SCREEN
ABO/RH(D): AB POS
Antibody Screen: NEGATIVE

## 2018-08-11 LAB — CBC
HCT: 34 % — ABNORMAL LOW (ref 39.0–52.0)
HEMOGLOBIN: 10.1 g/dL — AB (ref 13.0–17.0)
MCH: 27.5 pg (ref 26.0–34.0)
MCHC: 29.7 g/dL — AB (ref 30.0–36.0)
MCV: 92.6 fL (ref 80.0–100.0)
Platelets: 205 10*3/uL (ref 150–400)
RBC: 3.67 MIL/uL — ABNORMAL LOW (ref 4.22–5.81)
RDW: 12.8 % (ref 11.5–15.5)
WBC: 7.6 10*3/uL (ref 4.0–10.5)
nRBC: 0 % (ref 0.0–0.2)

## 2018-08-11 LAB — COMPREHENSIVE METABOLIC PANEL
ALK PHOS: 51 U/L (ref 38–126)
ALT: 16 U/L (ref 0–44)
ALT: 18 U/L (ref 0–44)
ANION GAP: 11 (ref 5–15)
AST: 17 U/L (ref 15–41)
AST: 23 U/L (ref 15–41)
Albumin: 3.5 g/dL (ref 3.5–5.0)
Albumin: 3.7 g/dL (ref 3.5–5.0)
Alkaline Phosphatase: 51 U/L (ref 38–126)
Anion gap: 7 (ref 5–15)
BUN: 68 mg/dL — ABNORMAL HIGH (ref 8–23)
BUN: 68 mg/dL — ABNORMAL HIGH (ref 8–23)
CO2: 23 mmol/L (ref 22–32)
CO2: 23 mmol/L (ref 22–32)
Calcium: 8.5 mg/dL — ABNORMAL LOW (ref 8.9–10.3)
Calcium: 8.5 mg/dL — ABNORMAL LOW (ref 8.9–10.3)
Chloride: 107 mmol/L (ref 98–111)
Chloride: 108 mmol/L (ref 98–111)
Creatinine, Ser: 3.61 mg/dL — ABNORMAL HIGH (ref 0.61–1.24)
Creatinine, Ser: 3.88 mg/dL — ABNORMAL HIGH (ref 0.61–1.24)
GFR calc Af Amer: 15 mL/min — ABNORMAL LOW (ref >60)
GFR calc Af Amer: 16 mL/min — ABNORMAL LOW (ref >60)
GFR calc non Af Amer: 13 mL/min — ABNORMAL LOW (ref >60)
GFR calc non Af Amer: 14 mL/min — ABNORMAL LOW (ref >60)
GLUCOSE: 147 mg/dL — AB (ref 70–99)
Glucose, Bld: 137 mg/dL — ABNORMAL HIGH (ref 70–99)
Potassium: 4.9 mmol/L (ref 3.5–5.1)
Potassium: 5.1 mmol/L (ref 3.5–5.1)
SODIUM: 138 mmol/L (ref 135–145)
Sodium: 141 mmol/L (ref 135–145)
Total Bilirubin: 0.6 mg/dL (ref 0.3–1.2)
Total Bilirubin: 0.8 mg/dL (ref 0.3–1.2)
Total Protein: 6.9 g/dL (ref 6.5–8.1)
Total Protein: 6.9 g/dL (ref 6.5–8.1)

## 2018-08-11 LAB — URINALYSIS, ROUTINE W REFLEX MICROSCOPIC
Bilirubin Urine: NEGATIVE
Glucose, UA: NEGATIVE mg/dL
Ketones, ur: NEGATIVE mg/dL
Nitrite: NEGATIVE
PROTEIN: 30 mg/dL — AB
Specific Gravity, Urine: 1.013 (ref 1.005–1.030)
WBC, UA: >50 WBC/hpf — ABNORMAL HIGH (ref 0–5)
pH: 6 (ref 5.0–8.0)

## 2018-08-11 LAB — PROTIME-INR
INR: 1.16
Prothrombin Time: 14.7 seconds (ref 11.4–15.2)

## 2018-08-11 LAB — LACTIC ACID, PLASMA
Lactic Acid, Venous: 3.4 mmol/L (ref 0.5–1.9)
Lactic Acid, Venous: 3 mmol/L (ref 0.5–1.9)

## 2018-08-11 LAB — MRSA PCR SCREENING: MRSA by PCR: POSITIVE — AB

## 2018-08-11 LAB — LIPASE, BLOOD: Lipase: 51 U/L (ref 11–51)

## 2018-08-11 MED ORDER — ACETAMINOPHEN 650 MG RE SUPP: 650.0000 mg | Freq: Four times a day (QID) | RECTAL | Status: DC | PRN

## 2018-08-11 MED ORDER — PANTOPRAZOLE SODIUM 40 MG PO TBEC
40.0000 mg | DELAYED_RELEASE_TABLET | Freq: Two times a day (BID) | ORAL | Status: DC
  Administered 2018-08-11 – 2018-08-13 (×5): 40 mg via ORAL
  Filled 2018-08-11 (×5): qty 1

## 2018-08-11 MED ORDER — METHOCARBAMOL 1000 MG/10ML IJ SOLN
500.0000 mg | Freq: Four times a day (QID) | INTRAVENOUS | Status: DC | PRN
  Filled 2018-08-11: qty 5

## 2018-08-11 MED ORDER — FENTANYL CITRATE (PF) 100 MCG/2ML IJ SOLN
50.0000 ug | Freq: Once | INTRAMUSCULAR | Status: AC
  Administered 2018-08-11: 50 ug via INTRAVENOUS
  Filled 2018-08-11: qty 2

## 2018-08-11 MED ORDER — SODIUM CHLORIDE 0.9 % IV SOLN
INTRAVENOUS | Status: DC
  Administered 2018-08-11 – 2018-08-13 (×4): via INTRAVENOUS

## 2018-08-11 MED ORDER — ONDANSETRON HCL 4 MG/2ML IJ SOLN
4.0000 mg | Freq: Once | INTRAMUSCULAR | Status: AC
  Administered 2018-08-11: 4 mg via INTRAVENOUS
  Filled 2018-08-11: qty 2

## 2018-08-11 MED ORDER — PROMETHAZINE HCL 25 MG/ML IJ SOLN
12.5000 mg | Freq: Once | INTRAMUSCULAR | Status: AC | PRN
  Administered 2018-08-11: 12.5 mg via INTRAVENOUS
  Filled 2018-08-11: qty 1

## 2018-08-11 MED ORDER — ONDANSETRON HCL 4 MG/2ML IJ SOLN
4.0000 mg | Freq: Four times a day (QID) | INTRAMUSCULAR | Status: DC | PRN
  Administered 2018-08-11 – 2018-08-14 (×3): 4 mg via INTRAVENOUS
  Filled 2018-08-11 (×4): qty 2

## 2018-08-11 MED ORDER — MUPIROCIN 2 % EX OINT
1.0000 "application " | TOPICAL_OINTMENT | Freq: Two times a day (BID) | CUTANEOUS | Status: DC
  Administered 2018-08-11 – 2018-08-14 (×7): 1 via NASAL
  Filled 2018-08-11 (×2): qty 22

## 2018-08-11 MED ORDER — PIPERACILLIN-TAZOBACTAM IN DEX 2-0.25 GM/50ML IV SOLN
2.2500 g | Freq: Three times a day (TID) | INTRAVENOUS | Status: AC
  Administered 2018-08-11 – 2018-08-12 (×4): 2.25 g via INTRAVENOUS
  Filled 2018-08-11 (×5): qty 50

## 2018-08-11 MED ORDER — TAMSULOSIN HCL 0.4 MG PO CAPS
0.4000 mg | ORAL_CAPSULE | Freq: Every day | ORAL | Status: DC
  Administered 2018-08-12 – 2018-08-13 (×2): 0.4 mg via ORAL
  Filled 2018-08-11 (×2): qty 1

## 2018-08-11 MED ORDER — MORPHINE SULFATE (PF) 2 MG/ML IV SOLN
INTRAVENOUS | Status: AC
  Filled 2018-08-11: qty 1

## 2018-08-11 MED ORDER — PIPERACILLIN-TAZOBACTAM IN DEX 2-0.25 GM/50ML IV SOLN
2.2500 g | Freq: Three times a day (TID) | INTRAVENOUS | Status: DC
  Administered 2018-08-11: 2.25 g via INTRAVENOUS
  Filled 2018-08-11 (×3): qty 50

## 2018-08-11 MED ORDER — PIPERACILLIN-TAZOBACTAM 3.375 G IVPB 30 MIN
3.3750 g | Freq: Once | INTRAVENOUS | Status: AC
  Administered 2018-08-11: 3.375 g via INTRAVENOUS
  Filled 2018-08-11: qty 50

## 2018-08-11 MED ORDER — LATANOPROST 0.005 % OP SOLN
1.0000 [drp] | Freq: Every day | OPHTHALMIC | Status: DC
  Administered 2018-08-11 – 2018-08-13 (×3): 1 [drp] via OPHTHALMIC
  Filled 2018-08-11: qty 2.5

## 2018-08-11 MED ORDER — CHLORHEXIDINE GLUCONATE CLOTH 2 % EX PADS
6.0000 | MEDICATED_PAD | Freq: Every day | CUTANEOUS | Status: DC
  Administered 2018-08-11 – 2018-08-13 (×3): 6 via TOPICAL

## 2018-08-11 MED ORDER — CARVEDILOL 6.25 MG PO TABS
6.2500 mg | ORAL_TABLET | Freq: Two times a day (BID) | ORAL | Status: DC
  Administered 2018-08-12 – 2018-08-13 (×4): 6.25 mg via ORAL
  Filled 2018-08-11 (×4): qty 1

## 2018-08-11 MED ORDER — ONDANSETRON HCL 4 MG PO TABS: 4.0000 mg | ORAL_TABLET | Freq: Four times a day (QID) | ORAL | Status: DC | PRN

## 2018-08-11 MED ORDER — FENTANYL CITRATE (PF) 100 MCG/2ML IJ SOLN
50.0000 ug | INTRAMUSCULAR | Status: DC | PRN
  Administered 2018-08-11 (×3): 50 ug via INTRAVENOUS
  Filled 2018-08-11 (×4): qty 2

## 2018-08-11 MED ORDER — VITAMIN D3 25 MCG (1000 UNIT) PO TABS
1000.0000 [IU] | ORAL_TABLET | Freq: Two times a day (BID) | ORAL | Status: DC
  Administered 2018-08-11 – 2018-08-13 (×4): 1000 [IU] via ORAL
  Filled 2018-08-11 (×5): qty 1

## 2018-08-11 MED ORDER — ASPIRIN EC 81 MG PO TBEC
81.0000 mg | DELAYED_RELEASE_TABLET | Freq: Every day | ORAL | Status: DC
  Administered 2018-08-13: 81 mg via ORAL
  Filled 2018-08-11 (×2): qty 1

## 2018-08-11 MED ORDER — DOCUSATE SODIUM 100 MG PO CAPS: 100.0000 mg | ORAL_CAPSULE | Freq: Two times a day (BID) | ORAL | Status: DC | PRN

## 2018-08-11 MED ORDER — DORZOLAMIDE HCL-TIMOLOL MAL 2-0.5 % OP SOLN
1.0000 [drp] | Freq: Two times a day (BID) | OPHTHALMIC | Status: DC
  Administered 2018-08-11 – 2018-08-14 (×6): 1 [drp] via OPHTHALMIC
  Filled 2018-08-11: qty 10

## 2018-08-11 MED ORDER — ACETAMINOPHEN 325 MG PO TABS
650.0000 mg | ORAL_TABLET | Freq: Four times a day (QID) | ORAL | Status: DC | PRN
  Administered 2018-08-13: 650 mg via ORAL
  Filled 2018-08-11: qty 2

## 2018-08-11 MED ORDER — MORPHINE SULFATE (PF) 2 MG/ML IV SOLN
2.0000 mg | Freq: Once | INTRAVENOUS | Status: AC
  Administered 2018-08-11: 2 mg via INTRAVENOUS

## 2018-08-11 NOTE — ED Notes (Signed)
Urine culture sent to the lab. 

## 2018-08-11 NOTE — Consult Note (Signed)
[facility] Surgery Consult Note  Bradley [name] Jun 20, 1928  [id number].    Requesting MD: Dana Allan Chief Complaint/Reason for Consult: RUQ pain  HPI:  Bradley [name] is an 85yo male PMH CAD s/p NSTEMI 09/2017, combined CHF (EF 25-30% on ECHO 08/2017), HTN, s/p pacemaker, CKD, who presented to ED late last night complaining of acute onset abdominal pain. States that he was ok until he ate dinner last night, at which point he started having severe right sided abdominal pain. Associated with nausea and vomiting. Thinks that he may have had diarrhea last night. Denies fever or chills. Pain constant and gradually worsening, therefore he and his wife called EMS. States that he thinks he's had similar pain in the past, although never as severe. He is currently still writing in pain, cannot get comfortable.   ED workup included CT abdomen/pelvis which showed distended gallbladder with stone in the region of the gallbladder neck, no inflammatory changes. U/s reports gallbladder poorly visualized, positive sonographic Murphy's sign. WBC 7.6, AST 23, ALT 18, alk phos 51, Tbili 0.8.  General surgery asked to see.  -Abdominal surgical history: none -Anticoagulants: none -Nonsmoker -Lives at home with his wife -Ambulates with a walker  ROS: Review of Systems  Constitutional: Negative.   HENT: Negative.   Eyes: Negative.   Respiratory: Positive for shortness of breath.   Cardiovascular: Negative for chest pain and leg swelling.  Gastrointestinal: Positive for abdominal pain, diarrhea, nausea and vomiting. Negative for constipation.  Genitourinary: Negative.   Musculoskeletal: Negative.   Skin: Negative.   Neurological: Negative.    All systems reviewed and otherwise negative except for as above  Family History  Problem Relation Age of Onset  . Heart failure Mother   . Hypertension Mother   . Pneumonia Father   . Hypertension Brother   . Heart attack Neg Hx   . Stroke  Neg Hx     Past Medical History:  Diagnosis Date  . Arthritis   . BPH (benign prostatic hypertrophy)   . Cardiomyopathy ([place])    a. thought to be ischemic with high risk MPS EF 23%. Reluctant to do cath due to CKD  . Cataract   . Chronic kidney disease   . Dysrhythmia   . GERD (gastroesophageal reflux disease)   . Hypertension   . PPD positive    a. HX POSITIVE PPD WITH NEGATIVE CXR  . Status post placement of cardiac pacemaker    a. 04/2015: bradycardic arrest s/p Biotronik serial #[id number] pacemaker.  . Strabismus    a. right eye  . Symptomatic bradycardia    a. s/p Biotronik serial [id number] pacemaker.  . Syncope and collapse    a. in 2014 and again in 08/2014. Thought to be vasovagal   . Varicosities of leg     Past Surgical History:  Procedure Laterality Date  . CATARACT EXTRACTION W/ INTRAOCULAR LENS IMPLANT     BOTH EYES  . EP IMPLANTABLE DEVICE N/A 05/13/2015   Procedure: Pacemaker Implant;  Surgeon: Evans Lance, MD;  Location: [facility];  Service: Cardiovascular;  Laterality: N/A;  . ESOPHAGOGASTRODUODENOSCOPY (EGD) WITH PROPOFOL Left 10/04/2017   Procedure: ESOPHAGOGASTRODUODENOSCOPY (EGD) WITH PROPOFOL;  Surgeon: Ronnette Juniper, MD;  Location: [facility] [place];  Service: Gastroenterology;  Laterality: Left;  . IR ANGIOGRAM SELECTIVE EACH ADDITIONAL VESSEL  10/04/2017  . IR ANGIOGRAM VISCERAL SELECTIVE  10/04/2017  . IR EMBO ART  VEN HEMORR LYMPH EXTRAV  INC GUIDE ROADMAPPING  10/04/2017  . IR  US GUIDE VASC ACCESS RIGHT  10/04/2017  . TONSILLECTOMY     "I was young" (10/15/2012)  . TOTAL KNEE ARTHROPLASTY Left 12/01/2012   Procedure: LEFT TOTAL KNEE ARTHROPLASTY;  Surgeon: [name], MD;  Location: [facility];  Service: Orthopedics;  Laterality: Left;  . TRANSURETHRAL RESECTION OF PROSTATE  2006    Social History:  reports that he has never smoked. He has never used smokeless tobacco. He reports that he does not drink alcohol or use drugs.  Allergies: No Known  Allergies  Medications Prior to Admission  Medication Sig Dispense Refill  . acetaminophen (TYLENOL) 500 MG tablet Take 500 mg by mouth at bedtime as needed for headache (pain).    Marland Kitchen aspirin EC 81 MG tablet Take 1 tablet (81 mg total) by mouth daily. If hemoglobin is stable    . carvedilol (COREG) 6.25 MG tablet Take 6.25 mg by mouth 2 (two) times daily with a meal.     . cholecalciferol (VITAMIN D) 1000 UNITS tablet Take 1,000 Units by mouth 2 (two) times daily.    Marland Kitchen docusate sodium (COLACE) 100 MG capsule Take 100 mg by mouth 2 (two) times daily as needed for mild constipation.    . dorzolamide-timolol (COSOPT) 22.3-6.8 MG/ML ophthalmic solution Place 1 drop into both eyes 2 (two) times daily.     . furosemide (LASIX) 40 MG tablet Take 1 tablet (40 mg total) by mouth every other day. 45 tablet 3  . latanoprost (XALATAN) 0.005 % ophthalmic solution Place 1 drop into both eyes at bedtime.    . nitroGLYCERIN (NITROSTAT) 0.4 MG SL tablet Place 0.4 mg under the tongue every 5 (five) minutes as needed for chest pain (max 3 doses within 15 minutes call 911).    . pantoprazole (PROTONIX) 40 MG tablet Take 1 tablet (40 mg total) by mouth 2 (two) times daily. 60 tablet 0    Prior to Admission medications   Medication Sig Start Date End Date Taking? Authorizing Provider  acetaminophen (TYLENOL) 500 MG tablet Take 500 mg by mouth at bedtime as needed for headache (pain).   Yes [provider]  aspirin EC 81 MG tablet Take 1 tablet (81 mg total) by mouth daily. If hemoglobin is stable 10/17/17  Yes Dhungel, Nishant, MD  carvedilol (COREG) 6.25 MG tablet Take 6.25 mg by mouth 2 (two) times daily with a meal.    Yes [provider]  cholecalciferol (VITAMIN D) 1000 UNITS tablet Take 1,000 Units by mouth 2 (two) times daily.   Yes [provider]  docusate sodium (COLACE) 100 MG capsule Take 100 mg by mouth 2 (two) times daily as needed for mild constipation.   Yes [provider]  dorzolamide-timolol (COSOPT) 22.3-6.8 MG/ML ophthalmic solution Place 1 drop into both eyes 2 (two) times daily.  08/21/14  Yes [provider]  furosemide (LASIX) 40 MG tablet Take 1 tablet (40 mg total) by mouth every other day. 12/20/17  Yes Isaiah Serge, NP  latanoprost (XALATAN) 0.005 % ophthalmic solution Place 1 drop into both eyes at bedtime.   Yes [provider]  nitroGLYCERIN (NITROSTAT) 0.4 MG SL tablet Place 0.4 mg under the tongue every 5 (five) minutes as needed for chest pain (max 3 doses within 15 minutes call 911).   Yes [provider]  pantoprazole (PROTONIX) 40 MG tablet Take 1 tablet (40 mg total) by mouth 2 (two) times daily. 10/10/17 10/10/18 Yes Dhungel, Flonnie Overman, MD    Blood pressure Marland Kitchen)  152/92, pulse 99, temperature 97.7 F (36.5 C), resp. rate (!) 22, height 6' (1.829 m), weight 84.8 kg, SpO2 96 %. Physical Exam: General: frail AA male moving around in bed, cannot get comfortable HEENT: head is normocephalic, atraumatic.  Sclera are noninjected.  Pupils equal and round.  Ears and nose without any masses or lesions.  Mouth is dry. Poor dentition Heart: regular, rate, and rhythm.  Pacemaker. Palpable pedal pulses bilaterally Lungs: CTAB, no wheezes, rhonchi, or rales noted.  Respiratory effort nonlabored Abd: no incisions noted, soft, ND, TTP RUQ and RLQ, +BS, no masses, hernias, or organomegaly MS: calves soft and nontender Skin: warm and dry with no masses, lesions, or rashes Neuro: cranial nerves grossly intact, extremity CSM intact bilaterally, normal speech  Results for orders placed or performed during the hospital encounter of 08/09/2018 (from the past 48 hour(s))  Lipase, blood     Status: None   Collection Time: 08/08/2018 11:45 PM  Result Value Ref Range   Lipase 51 11 - 51 U/L    Comment: Performed at [facility], [place] [address], [place], [place] [address]  Comprehensive metabolic panel     Status:  Abnormal   Collection Time: 08/01/2018 11:45 PM  Result Value Ref Range   Sodium 138 135 - 145 mmol/L   Potassium 5.1 3.5 - 5.1 mmol/L   Chloride 108 98 - 111 mmol/L   CO2 23 22 - 32 mmol/L   Glucose, Bld 137 (H) 70 - 99 mg/dL   BUN 68 (H) 8 - 23 mg/dL   Creatinine, Ser 3.88 (H) 0.61 - 1.24 mg/dL   Calcium 8.5 (L) 8.9 - 10.3 mg/dL   Total Protein 6.9 6.5 - 8.1 g/dL   Albumin 3.5 3.5 - 5.0 g/dL   AST 17 15 - 41 U/L   ALT 16 0 - 44 U/L   Alkaline Phosphatase 51 38 - 126 U/L   Total Bilirubin 0.6 0.3 - 1.2 mg/dL   GFR calc non Af Amer 13 (L) >60 mL/min   GFR calc Af Amer 15 (L) >60 mL/min   Anion gap 7 5 - 15    Comment: Performed at [facility], [place] [address] [place], [place] [address]  CBC     Status: Abnormal   Collection Time: 08/05/2018 11:45 PM  Result Value Ref Range   WBC 6.8 4.0 - 10.5 K/uL   RBC 3.49 (L) 4.22 - 5.81 MIL/uL   Hemoglobin 9.8 (L) 13.0 - 17.0 g/dL   HCT 32.9 (L) 39.0 - 52.0 %   MCV 94.3 80.0 - 100.0 fL   MCH 28.1 26.0 - 34.0 pg   MCHC 29.8 (L) 30.0 - 36.0 g/dL   RDW 12.9 11.5 - 15.5 %   Platelets 179 150 - 400 K/uL   nRBC 0.0 0.0 - 0.2 %    Comment: Performed at [facility], [place] [address], [place], [place] [address]  Type and screen     Status: None   Collection Time: 08/08/2018 11:45 PM  Result Value Ref Range   ABO/RH(D) AB POS    Antibody Screen NEG    Sample Expiration      08/13/2018 Performed at [facility], [place] [address] [place], [place] [address]   Urinalysis, Routine w reflex microscopic     Status: Abnormal   Collection Time: 08/11/18  4:41 AM  Result Value Ref Range   Color, Urine YELLOW YELLOW   APPearance CLOUDY (A) CLEAR  Specific Gravity, Urine 1.013 1.005 - 1.030   pH 6.0 5.0 - 8.0   Glucose, UA NEGATIVE NEGATIVE mg/dL   Hgb urine dipstick SMALL (A) NEGATIVE   Bilirubin Urine NEGATIVE NEGATIVE   Ketones, ur NEGATIVE NEGATIVE mg/dL   Protein, ur 30 (A) NEGATIVE mg/dL    Nitrite NEGATIVE NEGATIVE   Leukocytes, UA MODERATE (A) NEGATIVE   RBC / HPF 6-10 0 - 5 RBC/hpf   WBC, UA >50 (H) 0 - 5 WBC/hpf   Bacteria, UA RARE (A) NONE SEEN   Squamous Epithelial / LPF 0-5 0 - 5   WBC Clumps PRESENT     Comment: Performed at [facility], [place] [address] [place], [place] [address]  CBC     Status: Abnormal   Collection Time: 08/11/18  5:00 AM  Result Value Ref Range   WBC 7.6 4.0 - 10.5 K/uL   RBC 3.67 (L) 4.22 - 5.81 MIL/uL   Hemoglobin 10.1 (L) 13.0 - 17.0 g/dL   HCT 34.0 (L) 39.0 - 52.0 %   MCV 92.6 80.0 - 100.0 fL   MCH 27.5 26.0 - 34.0 pg   MCHC 29.7 (L) 30.0 - 36.0 g/dL   RDW 12.8 11.5 - 15.5 %   Platelets 205 150 - 400 K/uL   nRBC 0.0 0.0 - 0.2 %    Comment: Performed at [facility], [place] [address], [place], [place] [address]  Comprehensive metabolic panel     Status: Abnormal   Collection Time: 08/11/18  5:00 AM  Result Value Ref Range   Sodium 141 135 - 145 mmol/L   Potassium 4.9 3.5 - 5.1 mmol/L   Chloride 107 98 - 111 mmol/L   CO2 23 22 - 32 mmol/L   Glucose, Bld 147 (H) 70 - 99 mg/dL   BUN 68 (H) 8 - 23 mg/dL   Creatinine, Ser 3.61 (H) 0.61 - 1.24 mg/dL   Calcium 8.5 (L) 8.9 - 10.3 mg/dL   Total Protein 6.9 6.5 - 8.1 g/dL   Albumin 3.7 3.5 - 5.0 g/dL   AST 23 15 - 41 U/L   ALT 18 0 - 44 U/L   Alkaline Phosphatase 51 38 - 126 U/L   Total Bilirubin 0.8 0.3 - 1.2 mg/dL   GFR calc non Af Amer 14 (L) >60 mL/min   GFR calc Af Amer 16 (L) >60 mL/min   Anion gap 11 5 - 15    Comment: Performed at [facility], [place] [address] [place], [place] [address]   Ct Abdomen Pelvis Wo Contrast  Result Date: 08/11/2018 CLINICAL DATA:  Abdominal pain, nausea, and vomiting. EXAM: CT ABDOMEN AND PELVIS WITHOUT CONTRAST TECHNIQUE: Multidetector CT imaging of the abdomen and pelvis was performed following the standard protocol without IV contrast. COMPARISON:  None. FINDINGS: Lower chest: Atelectasis  in the lung bases. Cardiac enlargement. Coronary artery calcifications. Hepatobiliary: No focal liver lesions. Gallbladder is distended with stone in the region of the gallbladder neck. Increased density of the bile suggesting sludge. No inflammatory infiltration or wall thickening. No bile duct dilatation. Pancreas: Unremarkable. No pancreatic ductal dilatation or surrounding inflammatory changes. Spleen: Normal in size without focal abnormality. Adrenals/Urinary Tract: No adrenal gland nodules. Punctate stone in the midpole right kidney. No hydronephrosis or hydroureter. Bladder is somewhat decompressed. There is diffuse bladder wall thickening with multiple bladder diverticula. No stones. Stomach/Bowel: Stomach is within normal limits. Appendix appears normal. No evidence of bowel wall thickening, distention, or inflammatory changes.  Colonic diverticula without evidence of diverticulitis. Vascular/Lymphatic: Aortic atherosclerosis. No enlarged abdominal or pelvic lymph nodes. Reproductive: Prostate is unremarkable. Other: No abdominal wall hernia or abnormality. No abdominopelvic ascites. Musculoskeletal: Degenerative changes in the spine. IMPRESSION: 1. Distended gallbladder with stone in the region of the gallbladder neck. Increased density of the bile suggesting sludge. No inflammatory changes. 2. Diffuse bladder wall thickening with multiple bladder diverticula consistent with chronic bladder outlet obstruction. Aortic Atherosclerosis (ICD10-I70.0). Electronically Signed   By: Lucienne Capers M.D.   On: 08/11/2018 01:22   US Abdomen Limited Ruq  Result Date: 08/11/2018 CLINICAL DATA:  Epigastric pain EXAM: ULTRASOUND ABDOMEN LIMITED RIGHT UPPER QUADRANT COMPARISON:  CT abdomen/pelvis dated 08/11/2018 at 0046 hours FINDINGS: Gallbladder: Poorly visualized/evaluated due to patient discomfort. Positive sonographic Murphy's sign. Common bile duct: Not discretely visualized. Liver: At the upper limits of  normal for parenchymal echogenicity. No focal hepatic lesion is seen. Portal vein is patent on color Doppler imaging with normal direction of blood flow towards the liver. IMPRESSION: Gallbladder is poorly visualized on ultrasound. Positive sonographic Murphy's sign. However, there were no pericholecystic inflammatory changes on CT to suggest acute cholecystitis. If there is continued clinical concern, consider hepatobiliary nuclear medicine scan. Electronically Signed   By: Julian Hy M.D.   On: 08/11/2018 02:25    Anti-infectives (From admission, onward)   Start     Dose/Rate Route Frequency Ordered Stop   08/11/18 1000  piperacillin-tazobactam (ZOSYN) IVPB 2.25 g     2.25 g 100 mL/hr over 30 Minutes Intravenous Every 8 hours 08/11/18 0237     08/11/18 0145  piperacillin-tazobactam (ZOSYN) IVPB 3.375 g     3.375 g 100 mL/hr over 30 Minutes Intravenous  Once 08/11/18 0134 08/11/18 0239       Assessment/Plan CAD s/p NSTEMI 09/2017 Combined CHF - EF 25-30% on ECHO 08/2017, cardiologist Dr. Tamala Julian HTN S/p pacemaker - followed by Dr. Lovena Le AKI on CKD  RUQ pain, nausea, vomiting Cholelithiasis - Patient with RUQ pain and gallstones, although no clear signs of acute cholecystitis on imaging. LFTs WNL. Will order HIDA scan for further evaluation. Depending on results patient may need laparoscopic cholecystectomy vs percutaneous cholecystostomy tube, suspect perc chole. Due to age and multiple medical problems, will ask cardiology to see for cardiac risk stratification. Continue IV antibiotics.   ID - zosyn 1/13>> VTE - SCDs FEN - IVF, NPO Foley - none  Wellington Hampshire, [facility] Surgery 08/11/2018, 10:22 AM Pager: [phone] Mon 7:00 am -11:30 AM Tues-Fri 7:00 am-4:30 pm Sat-Sun 7:00 am-11:30 am

## 2018-08-11 NOTE — ED Notes (Signed)
Pharmacy contacted to verify and time meds.

## 2018-08-11 NOTE — ED Notes (Signed)
Patient denies need to void

## 2018-08-11 NOTE — ED Notes (Signed)
ED TO INPATIENT HANDOFF REPORT  Name/Age/Gender Bradley [name] 85 y.o. male  Code Status    Code Status Orders  (From admission, onward)         Start     Ordered   08/11/18 0153  Full code  Continuous     08/11/18 0152        Code Status History    Date Active Date Inactive Code Status Order ID Comments User Context   10/03/2017 1509 10/10/2017 1950 Full Code [id number]  Rondel Jumbo PA-C ED   09/22/2017 2124 09/24/2017 2251 Full Code [id number]  Ivor Costa, MD ED   05/13/2015 1621 05/14/2015 1422 Full Code [id number]  Evans Lance, MD Inpatient   05/12/2015 2127 05/13/2015 1621 Full Code [id number]  Lonn Georgia, PA-C Inpatient   09/21/2014 1255 09/23/2014 2157 Full Code [id number]  Belva Crome, MD ED   12/01/2012 1507 12/07/2012 1903 Full Code [id number]  [name], MD Inpatient   10/15/2012 1712 10/17/2012 1718 Full Code [id number]  Kathie Dike, MD Inpatient   09/12/2011 1548 09/13/2011 1707 Full Code [id number]  Dupell, Gerald Dexter, RN ED      Home/SNF/Other Home  Chief Complaint Nausea/Emesis/Abd. Pain  Level of Care/Admitting Diagnosis ED Disposition    ED Disposition Condition Comment   Admit  Hospital Area: [facility] [[id number]]  Level of Care: Med-Surg [16]  Diagnosis: Acute calculous cholecystitis [812751]  Admitting Physician: Etta Quill [[id number]]  Attending Physician: Etta Quill [[id number]]  PT Class (Do Not Modify): Observation [104]  PT Acc Code (Do Not Modify): Observation [10022]       Medical History Past Medical History:  Diagnosis Date  . Arthritis   . BPH (benign prostatic hypertrophy)   . Cardiomyopathy ([place])    a. thought to be ischemic with high risk MPS EF 23%. Reluctant to do cath due to CKD  . Cataract   . Chronic kidney disease   . Dysrhythmia   . GERD (gastroesophageal reflux disease)   . Hypertension   . PPD positive    a. HX POSITIVE PPD WITH NEGATIVE CXR  . Status post placement of cardiac  pacemaker    a. 04/2015: bradycardic arrest s/p Biotronik serial #[id number] pacemaker.  . Strabismus    a. right eye  . Symptomatic bradycardia    a. s/p Biotronik serial [id number] pacemaker.  . Syncope and collapse    a. in 2014 and again in 08/2014. Thought to be vasovagal   . Varicosities of leg     Allergies No Known Allergies  IV Location/Drains/Wounds Patient Lines/Drains/Airways Status   Active Line/Drains/Airways    Name:   Placement date:   Placement time:   Site:   Days:   Peripheral IV 07/31/2018 Left Wrist   08/11/2018    2356    Wrist   1   Incision (Closed) 05/13/15 Left   05/13/15    1600     1186          Labs/Imaging Results for orders placed or performed during the hospital encounter of 08/03/2018 (from the past 48 hour(s))  Lipase, blood     Status: None   Collection Time: 08/12/2018 11:45 PM  Result Value Ref Range   Lipase 51 11 - 51 U/L    Comment: Performed at [facility], [place] [address], [place], [place] [address]  Comprehensive metabolic panel     Status: Abnormal   Collection Time: 08/18/2018 11:45 PM  Result Value Ref Range   Sodium 138 135 - 145 mmol/L   Potassium 5.1 3.5 - 5.1 mmol/L   Chloride 108 98 - 111 mmol/L   CO2 23 22 - 32 mmol/L   Glucose, Bld 137 (H) 70 - 99 mg/dL   BUN 68 (H) 8 - 23 mg/dL   Creatinine, Ser 3.88 (H) 0.61 - 1.24 mg/dL   Calcium 8.5 (L) 8.9 - 10.3 mg/dL   Total Protein 6.9 6.5 - 8.1 g/dL   Albumin 3.5 3.5 - 5.0 g/dL   AST 17 15 - 41 U/L   ALT 16 0 - 44 U/L   Alkaline Phosphatase 51 38 - 126 U/L   Total Bilirubin 0.6 0.3 - 1.2 mg/dL   GFR calc non Af Amer 13 (L) >60 mL/min   GFR calc Af Amer 15 (L) >60 mL/min   Anion gap 7 5 - 15    Comment: Performed at [facility], [place] [name] [place], [place] [address]  CBC     Status: Abnormal   Collection Time: 08/01/2018 11:45 PM  Result Value Ref Range   WBC 6.8 4.0 - 10.5 K/uL   RBC 3.49 (L) 4.22 - 5.81 MIL/uL   Hemoglobin 9.8 (L) 13.0  - 17.0 g/dL   HCT 32.9 (L) 39.0 - 52.0 %   MCV 94.3 80.0 - 100.0 fL   MCH 28.1 26.0 - 34.0 pg   MCHC 29.8 (L) 30.0 - 36.0 g/dL   RDW 12.9 11.5 - 15.5 %   Platelets 179 150 - 400 K/uL   nRBC 0.0 0.0 - 0.2 %    Comment: Performed at [facility], [place] [address] [place], [place] [address]  Type and screen     Status: None   Collection Time: 08/02/2018 11:45 PM  Result Value Ref Range   ABO/RH(D) AB POS    Antibody Screen NEG    Sample Expiration      08/13/2018 Performed at [facility], [place] [address], [place], [place] [address]   Urinalysis, Routine w reflex microscopic     Status: Abnormal   Collection Time: 08/11/18  4:41 AM  Result Value Ref Range   Color, Urine YELLOW YELLOW   APPearance CLOUDY (A) CLEAR   Specific Gravity, Urine 1.013 1.005 - 1.030   pH 6.0 5.0 - 8.0   Glucose, UA NEGATIVE NEGATIVE mg/dL   Hgb urine dipstick SMALL (A) NEGATIVE   Bilirubin Urine NEGATIVE NEGATIVE   Ketones, ur NEGATIVE NEGATIVE mg/dL   Protein, ur 30 (A) NEGATIVE mg/dL   Nitrite NEGATIVE NEGATIVE   Leukocytes, UA MODERATE (A) NEGATIVE   RBC / HPF 6-10 0 - 5 RBC/hpf   WBC, UA >50 (H) 0 - 5 WBC/hpf   Bacteria, UA RARE (A) NONE SEEN   Squamous Epithelial / LPF 0-5 0 - 5   WBC Clumps PRESENT     Comment: Performed at [facility], [place] [address] [place],  [address]  Comprehensive metabolic panel     Status: Abnormal   Collection Time: 08/11/18  5:00 AM  Result Value Ref Range   Sodium 141 135 - 145 mmol/L   Potassium 4.9 3.5 - 5.1 mmol/L   Chloride 107 98 - 111 mmol/L   CO2 23 22 - 32 mmol/L   Glucose, Bld 147 (H) 70 - 99 mg/dL   BUN 68 (H) 8 - 23 mg/dL   Creatinine, Ser 3.61 (H) 0.61 - 1.24 mg/dL   Calcium  8.5 (L) 8.9 - 10.3 mg/dL   Total Protein 6.9 6.5 - 8.1 g/dL   Albumin 3.7 3.5 - 5.0 g/dL   AST 23 15 - 41 U/L   ALT 18 0 - 44 U/L   Alkaline Phosphatase 51 38 - 126 U/L   Total Bilirubin 0.8 0.3 - 1.2 mg/dL   GFR  calc non Af Amer 14 (L) >60 mL/min   GFR calc Af Amer 16 (L) >60 mL/min   Anion gap 11 5 - 15    Comment: Performed at [facility], [place] [address] [place], [place] [address]   Ct Abdomen Pelvis Wo Contrast  Result Date: 08/11/2018 CLINICAL DATA:  Abdominal pain, nausea, and vomiting. EXAM: CT ABDOMEN AND PELVIS WITHOUT CONTRAST TECHNIQUE: Multidetector CT imaging of the abdomen and pelvis was performed following the standard protocol without IV contrast. COMPARISON:  None. FINDINGS: Lower chest: Atelectasis in the lung bases. Cardiac enlargement. Coronary artery calcifications. Hepatobiliary: No focal liver lesions. Gallbladder is distended with stone in the region of the gallbladder neck. Increased density of the bile suggesting sludge. No inflammatory infiltration or wall thickening. No bile duct dilatation. Pancreas: Unremarkable. No pancreatic ductal dilatation or surrounding inflammatory changes. Spleen: Normal in size without focal abnormality. Adrenals/Urinary Tract: No adrenal gland nodules. Punctate stone in the midpole right kidney. No hydronephrosis or hydroureter. Bladder is somewhat decompressed. There is diffuse bladder wall thickening with multiple bladder diverticula. No stones. Stomach/Bowel: Stomach is within normal limits. Appendix appears normal. No evidence of bowel wall thickening, distention, or inflammatory changes. Colonic diverticula without evidence of diverticulitis. Vascular/Lymphatic: Aortic atherosclerosis. No enlarged abdominal or pelvic lymph nodes. Reproductive: Prostate is unremarkable. Other: No abdominal wall hernia or abnormality. No abdominopelvic ascites. Musculoskeletal: Degenerative changes in the spine. IMPRESSION: 1. Distended gallbladder with stone in the region of the gallbladder neck. Increased density of the bile suggesting sludge. No inflammatory changes. 2. Diffuse bladder wall thickening with multiple bladder diverticula consistent with  chronic bladder outlet obstruction. Aortic Atherosclerosis (ICD10-I70.0). Electronically Signed   By: Lucienne Capers M.D.   On: 08/11/2018 01:22   US Abdomen Limited Ruq  Result Date: 08/11/2018 CLINICAL DATA:  Epigastric pain EXAM: ULTRASOUND ABDOMEN LIMITED RIGHT UPPER QUADRANT COMPARISON:  CT abdomen/pelvis dated 08/11/2018 at 0046 hours FINDINGS: Gallbladder: Poorly visualized/evaluated due to patient discomfort. Positive sonographic Murphy's sign. Common bile duct: Not discretely visualized. Liver: At the upper limits of normal for parenchymal echogenicity. No focal hepatic lesion is seen. Portal vein is patent on color Doppler imaging with normal direction of blood flow towards the liver. IMPRESSION: Gallbladder is poorly visualized on ultrasound. Positive sonographic Murphy's sign. However, there were no pericholecystic inflammatory changes on CT to suggest acute cholecystitis. If there is continued clinical concern, consider hepatobiliary nuclear medicine scan. Electronically Signed   By: Julian Hy M.D.   On: 08/11/2018 02:25   EKG Interpretation  Date/Time:  Sunday August 10 2018 23:46:48 EST Ventricular Rate:  60 PR Interval:    QRS Duration: 126 QT Interval:  469 QTC Calculation: 469 R Axis:   78 Text Interpretation:  Atrial-paced rhythm Nonspecific intraventricular conduction delay Repol abnrm suggests ischemia, lateral leads Confirmed by Wickline, Donald ([id number]) on 08/25/2018 11:56:10 PM   Pending Labs Unresulted Labs (From admission, onward)    Start     Ordered   08/12/18 0500  Creatinine, serum  Daily,   R     01 /13/20 0237   08/11/18 0500  CBC  Tomorrow morning,   R  08/11/18 0153          Vitals/Pain Today's Vitals   08/11/18 0400 08/11/18 0513 08/11/18 0600 08/11/18 0700  BP: 126/78 133/69 (!) 142/75 (!) 154/51  Pulse:  73  90  Resp: 20 19 12  (!) 22  Temp:      TempSrc:      SpO2:  100%  100%  Weight:      Height:      PainSc:         Isolation Precautions No active isolations  Medications Medications  0.9 %  sodium chloride infusion ( Intravenous New Bag/Given 08/11/18 0211)  aspirin EC tablet 81 mg (has no administration in time range)  carvedilol (COREG) tablet 6.25 mg (has no administration in time range)  cholecalciferol (VITAMIN D) tablet 1,000 Units (has no administration in time range)  docusate sodium (COLACE) capsule 100 mg (has no administration in time range)  dorzolamide-timolol (COSOPT) 22.3-6.8 MG/ML ophthalmic solution 1 drop (has no administration in time range)  latanoprost (XALATAN) 0.005 % ophthalmic solution 1 drop (has no administration in time range)  pantoprazole (PROTONIX) EC tablet 40 mg (has no administration in time range)  acetaminophen (TYLENOL) tablet 650 mg (has no administration in time range)    Or  acetaminophen (TYLENOL) suppository 650 mg (has no administration in time range)  ondansetron (ZOFRAN) tablet 4 mg (has no administration in time range)    Or  ondansetron (ZOFRAN) injection 4 mg (has no administration in time range)  fentaNYL (SUBLIMAZE) injection 50 mcg (has no administration in time range)  piperacillin-tazobactam (ZOSYN) IVPB 2.25 g (has no administration in time range)  ondansetron (ZOFRAN) injection 4 mg (4 mg Intravenous Given 08/02/2018 2354)  fentaNYL (SUBLIMAZE) injection 50 mcg (50 mcg Intravenous Given 08/09/2018 2355)  fentaNYL (SUBLIMAZE) injection 50 mcg (50 mcg Intravenous Given 08/11/18 0044)  piperacillin-tazobactam (ZOSYN) IVPB 3.375 g (0 g Intravenous Stopped 08/11/18 0239)  fentaNYL (SUBLIMAZE) injection 50 mcg (50 mcg Intravenous Given 08/11/18 0205)  ondansetron (ZOFRAN) injection 4 mg (4 mg Intravenous Given 08/11/18 0205)    Mobility walks

## 2018-08-11 NOTE — Progress Notes (Signed)
Pharmacy Antibiotic Note  Bradley [name] is a 85 y.o. male presented to the ED on 08/23/2018 with c/o abd pain and n/v.  To start zosyn for suspected acute calculous cholecystitis.  Plan: - zosyn 2.25 gm IV q8h - monitor renal function closely  ___________________________________  Height: 6' (182.9 cm) Weight: 187 lb (84.8 kg) IBW/kg (Calculated) : 77.6  Temp (24hrs), Avg:97.9 F (36.6 C), Min:97.9 F (36.6 C), Max:97.9 F (36.6 C)  Recent Labs  Lab 07/30/2018 2345  WBC 6.8  CREATININE 3.88*    Estimated Creatinine Clearance: 14.4 mL/min (A) (by C-G formula based on SCr of 3.88 mg/dL (H)).    No Known Allergies  Thank you for allowing pharmacy to be a part of this patient's care.  Lynelle Doctor 08/11/2018 2:34 AM

## 2018-08-11 NOTE — ED Notes (Signed)
US at bedside

## 2018-08-11 NOTE — Progress Notes (Signed)
Urology was consulted for foley placement as multiple bedside attempts failed.  Please see their note on thoughts of etiology.  Between 800-900cc relieved immediately.  Urine was sent for culture given dirty UA on admit.  Foley to stay in place for 1 week per Dr. Lovena Neighbours and he should follow up in the office for a scope and foley removal.    Will await HIDA scan, but it is possible his urinary symptoms are the source of his abdominal pain.  He could have multiple things going on.  Will continue to follow and await further work up.  Bradley [name] 3:32 PM 08/11/2018

## 2018-08-11 NOTE — ED Provider Notes (Signed)
[facility] DEPT Provider Note   CSN: [id number] Arrival date & time: 08/23/2018  2238     History   Chief Complaint Chief Complaint  Patient presents with  . Abdominal Pain   Patient gives permission to perform history and physical in front of family/friend HPI MERCED HANNERS is a 85 y.o. male.  The history is provided by the patient and a relative.  Abdominal Pain  Pain location:  Generalized Pain quality: aching   Pain severity:  Severe Onset quality:  Sudden Duration:  5 hours Timing:  Constant Progression:  Worsening Chronicity:  New Context: eating   Relieved by:  Nothing Worsened by:  Palpation and movement Associated symptoms: hematemesis, nausea and vomiting   Associated symptoms: no chest pain and no fever   Patient with history of CHF, pacemaker in place, chronic kidney disease presents with abdominal pain and vomiting.  He reports soon after eating dinner several hours ago he had onset of pain After Multiple times vomiting he has had blood in his vomit. He has had a GI bleed previously.  Past Medical History:  Diagnosis Date  . Arthritis   . BPH (benign prostatic hypertrophy)   . Cardiomyopathy ([place])    a. thought to be ischemic with high risk MPS EF 23%. Reluctant to do cath due to CKD  . Cataract   . Chronic kidney disease   . Dysrhythmia   . GERD (gastroesophageal reflux disease)   . Hypertension   . PPD positive    a. HX POSITIVE PPD WITH NEGATIVE CXR  . Status post placement of cardiac pacemaker    a. 04/2015: bradycardic arrest s/p Biotronik serial #[id number] pacemaker.  . Strabismus    a. right eye  . Symptomatic bradycardia    a. s/p Biotronik serial [id number] pacemaker.  . Syncope and collapse    a. in 2014 and again in 08/2014. Thought to be vasovagal   . Varicosities of leg     Patient Active Problem List   Diagnosis Date Noted  . Acute duodenal ulcer with bleeding 10/10/2017  . Acute blood loss  anemia 10/10/2017  . NSTEMI (non-ST elevated myocardial infarction) ([place])   . Abdominal pain 09/22/2017  . Symptomatic anemia 09/22/2017  . Protein-calorie malnutrition, moderate ([place]) 09/22/2017  . Symptomatic bradycardia   . Benign prostatic hyperplasia   . Syncope and collapse   . Hypertension   . Status post placement of cardiac pacemaker   . CAD in native artery 09/21/2014  . Chronic combined systolic (congestive) and diastolic (congestive) heart failure ([place]) 09/07/2014  . Chronic kidney disease, stage IV (severe) ([place]) 08/25/2014  . Acute lower UTI 09/13/2011  . Urethral stricture 09/13/2011  . GERD (gastroesophageal reflux disease) 09/13/2011  . Osteoarthritis of both knees 09/13/2011    Past Surgical History:  Procedure Laterality Date  . CATARACT EXTRACTION W/ INTRAOCULAR LENS IMPLANT     BOTH EYES  . EP IMPLANTABLE DEVICE N/A 05/13/2015   Procedure: Pacemaker Implant;  Surgeon: Evans Lance, MD;  Location: [facility];  Service: Cardiovascular;  Laterality: N/A;  . ESOPHAGOGASTRODUODENOSCOPY (EGD) WITH PROPOFOL Left 10/04/2017   Procedure: ESOPHAGOGASTRODUODENOSCOPY (EGD) WITH PROPOFOL;  Surgeon: Ronnette Juniper, MD;  Location: [facility];  Service: Gastroenterology;  Laterality: Left;  . IR ANGIOGRAM SELECTIVE EACH ADDITIONAL VESSEL  10/04/2017  . IR ANGIOGRAM VISCERAL SELECTIVE  10/04/2017  . IR EMBO ART  VEN HEMORR LYMPH EXTRAV  INC GUIDE ROADMAPPING  10/04/2017  . IR US GUIDE VASC  ACCESS RIGHT  10/04/2017  . TONSILLECTOMY     "I was young" (10/15/2012)  . TOTAL KNEE ARTHROPLASTY Left 12/01/2012   Procedure: LEFT TOTAL KNEE ARTHROPLASTY;  Surgeon: [name], MD;  Location: [facility];  Service: Orthopedics;  Laterality: Left;  . TRANSURETHRAL RESECTION OF PROSTATE  2006        Home Medications    Prior to Admission medications   Medication Sig Start Date End Date Taking? Authorizing Provider  acetaminophen (TYLENOL) 500 MG tablet Take 500 mg by mouth at bedtime  as needed for headache (pain).   Yes [provider]  aspirin EC 81 MG tablet Take 1 tablet (81 mg total) by mouth daily. If hemoglobin is stable 10/17/17  Yes Dhungel, Nishant, MD  carvedilol (COREG) 6.25 MG tablet Take 6.25 mg by mouth 2 (two) times daily with a meal.    Yes [provider]  cholecalciferol (VITAMIN D) 1000 UNITS tablet Take 1,000 Units by mouth 2 (two) times daily.   Yes [provider]  docusate sodium (COLACE) 100 MG capsule Take 100 mg by mouth 2 (two) times daily as needed for mild constipation.   Yes [provider]  dorzolamide-timolol (COSOPT) 22.3-6.8 MG/ML ophthalmic solution Place 1 drop into both eyes 2 (two) times daily.  08/21/14  Yes [provider]  furosemide (LASIX) 40 MG tablet Take 1 tablet (40 mg total) by mouth every other day. 12/20/17  Yes Isaiah Serge, NP  latanoprost (XALATAN) 0.005 % ophthalmic solution Place 1 drop into both eyes at bedtime.   Yes [provider]  nitroGLYCERIN (NITROSTAT) 0.4 MG SL tablet Place 0.4 mg under the tongue every 5 (five) minutes as needed for chest pain (max 3 doses within 15 minutes call 911).   Yes [provider]  pantoprazole (PROTONIX) 40 MG tablet Take 1 tablet (40 mg total) by mouth 2 (two) times daily. 10/10/17 10/10/18 Yes Dhungel, Flonnie Overman, MD    Family History Family History  Problem Relation Age of Onset  . Heart failure Mother   . Hypertension Mother   . Pneumonia Father   . Hypertension Brother   . Heart attack Neg Hx   . Stroke Neg Hx     Social History Social History   Tobacco Use  . Smoking status: Never Smoker  . Smokeless tobacco: Never Used  Substance Use Topics  . Alcohol use: No  . Drug use: No     Allergies   Patient has no known allergies.   Review of Systems Review of Systems  Constitutional: Negative for fever.  Cardiovascular: Negative for chest pain.  Gastrointestinal: Positive for abdominal pain, hematemesis,  nausea and vomiting.  All other systems reviewed and are negative.    Physical Exam Updated Vital Signs BP 124/67 (BP Location: Left Arm)   Pulse 60   Temp 97.9 F (36.6 C) (Oral)   Resp 17   Ht 1.829 m (6')   Wt 84.8 kg   SpO2 90%   BMI 25.36 kg/m   Physical Exam CONSTITUTIONAL: Elderly, uncomfortable. HEAD: Normocephalic/atraumatic EYES: EOMI/PERRL ENMT: Mucous membranes moist NECK: supple no meningeal signs SPINE/BACK:entire spine nontender CV: S1/S2 noted, no murmurs/rubs/gallops noted LUNGS: Lungs are clear to auscultation bilaterally, no apparent distress ABDOMEN: soft, diffuse moderate tenderness, decreased bowel sounds, no rebound or guarding Rectal-stool color brown, no melena or blood.  Male chaperone present for exam GU:no cva tenderness, no hernias noted no scrotal tenderness, chaperone present for exam NEURO: Pt is awake/alert/appropriate, moves all  extremitiesx4.  No facial droop.   EXTREMITIES: pulses normal/equal, full ROM SKIN: warm, color normal PSYCH: no abnormalities of mood noted, alert and oriented to situation   ED Treatments / Results  Labs (all labs ordered are listed, but only abnormal results are displayed) Labs Reviewed  COMPREHENSIVE METABOLIC PANEL - Abnormal; Notable for the following components:      Result Value   Glucose, Bld 137 (*)    BUN 68 (*)    Creatinine, Ser 3.88 (*)    Calcium 8.5 (*)    GFR calc non Af Amer 13 (*)    GFR calc Af Amer 15 (*)    All other components within normal limits  CBC - Abnormal; Notable for the following components:   RBC 3.49 (*)    Hemoglobin 9.8 (*)    HCT 32.9 (*)    MCHC 29.8 (*)    All other components within normal limits  LIPASE, BLOOD  URINALYSIS, ROUTINE W REFLEX MICROSCOPIC  CBC  COMPREHENSIVE METABOLIC PANEL  TYPE AND SCREEN    EKG EKG Interpretation  Date/Time:  Sunday August 10 2018 23:46:48 EST Ventricular Rate:  60 PR Interval:    QRS Duration: 126 QT  Interval:  469 QTC Calculation: 469 R Axis:   78 Text Interpretation:  Atrial-paced rhythm Nonspecific intraventricular conduction delay Repol abnrm suggests ischemia, lateral leads Confirmed by Ripley Fraise [phone]) on 08/12/2018 11:56:10 PM   Radiology Ct Abdomen Pelvis Wo Contrast  Result Date: 08/11/2018 CLINICAL DATA:  Abdominal pain, nausea, and vomiting. EXAM: CT ABDOMEN AND PELVIS WITHOUT CONTRAST TECHNIQUE: Multidetector CT imaging of the abdomen and pelvis was performed following the standard protocol without IV contrast. COMPARISON:  None. FINDINGS: Lower chest: Atelectasis in the lung bases. Cardiac enlargement. Coronary artery calcifications. Hepatobiliary: No focal liver lesions. Gallbladder is distended with stone in the region of the gallbladder neck. Increased density of the bile suggesting sludge. No inflammatory infiltration or wall thickening. No bile duct dilatation. Pancreas: Unremarkable. No pancreatic ductal dilatation or surrounding inflammatory changes. Spleen: Normal in size without focal abnormality. Adrenals/Urinary Tract: No adrenal gland nodules. Punctate stone in the midpole right kidney. No hydronephrosis or hydroureter. Bladder is somewhat decompressed. There is diffuse bladder wall thickening with multiple bladder diverticula. No stones. Stomach/Bowel: Stomach is within normal limits. Appendix appears normal. No evidence of bowel wall thickening, distention, or inflammatory changes. Colonic diverticula without evidence of diverticulitis. Vascular/Lymphatic: Aortic atherosclerosis. No enlarged abdominal or pelvic lymph nodes. Reproductive: Prostate is unremarkable. Other: No abdominal wall hernia or abnormality. No abdominopelvic ascites. Musculoskeletal: Degenerative changes in the spine. IMPRESSION: 1. Distended gallbladder with stone in the region of the gallbladder neck. Increased density of the bile suggesting sludge. No inflammatory changes. 2. Diffuse bladder wall  thickening with multiple bladder diverticula consistent with chronic bladder outlet obstruction. Aortic Atherosclerosis (ICD10-I70.0). Electronically Signed   By: Lucienne Capers M.D.   On: 08/11/2018 01:22    Procedures Procedures   Medications Ordered in ED Medications  piperacillin-tazobactam (ZOSYN) IVPB 3.375 g (has no administration in time range)  fentaNYL (SUBLIMAZE) injection 50 mcg (has no administration in time range)  ondansetron (ZOFRAN) injection 4 mg (has no administration in time range)  0.9 %  sodium chloride infusion (has no administration in time range)  aspirin EC tablet 81 mg (has no administration in time range)  carvedilol (COREG) tablet 6.25 mg (has no administration in time range)  cholecalciferol (VITAMIN D) tablet 1,000 Units (has no administration in time range)  docusate sodium (  COLACE) capsule 100 mg (has no administration in time range)  dorzolamide-timolol (COSOPT) 22.3-6.8 MG/ML ophthalmic solution 1 drop (has no administration in time range)  latanoprost (XALATAN) 0.005 % ophthalmic solution 1 drop (has no administration in time range)  pantoprazole (PROTONIX) EC tablet 40 mg (has no administration in time range)  acetaminophen (TYLENOL) tablet 650 mg (has no administration in time range)    Or  acetaminophen (TYLENOL) suppository 650 mg (has no administration in time range)  ondansetron (ZOFRAN) tablet 4 mg (has no administration in time range)    Or  ondansetron (ZOFRAN) injection 4 mg (has no administration in time range)  ondansetron (ZOFRAN) injection 4 mg (4 mg Intravenous Given 08/21/2018 2354)  fentaNYL (SUBLIMAZE) injection 50 mcg (50 mcg Intravenous Given 08/05/2018 2355)  fentaNYL (SUBLIMAZE) injection 50 mcg (50 mcg Intravenous Given 08/11/18 0044)     Initial Impression / Assessment and Plan / ED Course  I have reviewed the triage vital signs and the nursing notes.  Pertinent labs  results that were available during my care of the patient  were reviewed by me and considered in my medical decision making (see chart for details).    2:09 AM Patient presented from home for nausea/vomiting abdominal pain.  There was some reports of hematemesis, but there was no blood on rectal exam and there is no significant anemia However CT imaging reveals distended gallbladder with probable stone in the neck of the gallbladder Patient is still having pain with nausea and vomiting. I have ordered IV antibiotics and called the hospitalist Dr. Alcario Drought for admission. Defer surgical consultation till later in the morning. I discussed the case with his daughter Stanton Kidney and number [phone] She has been updated on the plan. Final Clinical Impressions(s) / ED Diagnoses   Final diagnoses:  Pain  Dehydration  Cholecystitis    ED Discharge Orders    None       Ripley Fraise, MD 08/11/18 [phone]

## 2018-08-11 NOTE — Consult Note (Signed)
Urology Consult   Physician requesting consult: Dr. Hassell Done    Reason for consult:  Urinary retention  History of Present Illness: Bradley [name] is a 85 y.o. year old male with multiple medical issues who is currently being treated for suspected cholecystitis.  Urology was consulted to assess the patient for acute urinary retention, worsening suprapubic pain and difficult Foley catheter placement.  The nursing staff has attempted to place 68 F straight and 34F coude catheters, but were met with resistance in the prostatic urethra.    He states that he has seen a urologist in the distant past for voiding issues but cannot recall how he was treated.  Prior to this hospitalization, he reports a weak FOS and occasional episodes of incontinence, but denies recent issues with dysuria, UTIs or hematuria.  He does have TURP in 2006 documented in his chart, but he cannot expand on any details at this moment.   CT from 08/11/18 shows numerous bladder diverticula as well as calcifications within the prostate.  UA today shows signs of cystitis, but no culture appears to have been ordered.     Past Medical History:  Diagnosis Date  . Arthritis   . BPH (benign prostatic hypertrophy)   . Cardiomyopathy ([place])    a. thought to be ischemic with high risk MPS EF 23%. Reluctant to do cath due to CKD  . Cataract   . Chronic kidney disease   . Dysrhythmia   . GERD (gastroesophageal reflux disease)   . Hypertension   . PPD positive    a. HX POSITIVE PPD WITH NEGATIVE CXR  . Status post placement of cardiac pacemaker    a. 04/2015: bradycardic arrest s/p Biotronik serial #[id number] pacemaker.  . Strabismus    a. right eye  . Symptomatic bradycardia    a. s/p Biotronik serial [id number] pacemaker.  . Syncope and collapse    a. in 2014 and again in 08/2014. Thought to be vasovagal   . Varicosities of leg     Past Surgical History:  Procedure Laterality Date  . CATARACT EXTRACTION W/ INTRAOCULAR LENS  IMPLANT     BOTH EYES  . EP IMPLANTABLE DEVICE N/A 05/13/2015   Procedure: Pacemaker Implant;  Surgeon: Evans Lance, MD;  Location: [facility];  Service: Cardiovascular;  Laterality: N/A;  . ESOPHAGOGASTRODUODENOSCOPY (EGD) WITH PROPOFOL Left 10/04/2017   Procedure: ESOPHAGOGASTRODUODENOSCOPY (EGD) WITH PROPOFOL;  Surgeon: Ronnette Juniper, MD;  Location: [facility];  Service: Gastroenterology;  Laterality: Left;  . IR ANGIOGRAM SELECTIVE EACH ADDITIONAL VESSEL  10/04/2017  . IR ANGIOGRAM VISCERAL SELECTIVE  10/04/2017  . IR EMBO ART  VEN HEMORR LYMPH EXTRAV  INC GUIDE ROADMAPPING  10/04/2017  . IR US GUIDE VASC ACCESS RIGHT  10/04/2017  . TONSILLECTOMY     "I was young" (10/15/2012)  . TOTAL KNEE ARTHROPLASTY Left 12/01/2012   Procedure: LEFT TOTAL KNEE ARTHROPLASTY;  Surgeon: [name], MD;  Location: [facility];  Service: Orthopedics;  Laterality: Left;  . TRANSURETHRAL RESECTION OF PROSTATE  2006    Current Hospital Medications:  Home Meds:  Current Meds  Medication Sig  . acetaminophen (TYLENOL) 500 MG tablet Take 500 mg by mouth at bedtime as needed for headache (pain).  Marland Kitchen aspirin EC 81 MG tablet Take 1 tablet (81 mg total) by mouth daily. If hemoglobin is stable  . carvedilol (COREG) 6.25 MG tablet Take 6.25 mg by mouth 2 (two) times daily with a meal.   . cholecalciferol (VITAMIN D)  1000 UNITS tablet Take 1,000 Units by mouth 2 (two) times daily.  Marland Kitchen docusate sodium (COLACE) 100 MG capsule Take 100 mg by mouth 2 (two) times daily as needed for mild constipation.  . dorzolamide-timolol (COSOPT) 22.3-6.8 MG/ML ophthalmic solution Place 1 drop into both eyes 2 (two) times daily.   . furosemide (LASIX) 40 MG tablet Take 1 tablet (40 mg total) by mouth every other day.  . latanoprost (XALATAN) 0.005 % ophthalmic solution Place 1 drop into both eyes at bedtime.  . nitroGLYCERIN (NITROSTAT) 0.4 MG SL tablet Place 0.4 mg under the tongue every 5 (five) minutes as needed for chest pain (max  3 doses within 15 minutes call 911).  . pantoprazole (PROTONIX) 40 MG tablet Take 1 tablet (40 mg total) by mouth 2 (two) times daily.    Scheduled Meds: . aspirin EC  81 mg Oral Daily  . carvedilol  6.25 mg Oral BID WC  . Chlorhexidine Gluconate Cloth  6 each Topical Q0600  . cholecalciferol  1,000 Units Oral BID  . dorzolamide-timolol  1 drop Both Eyes BID  . latanoprost  1 drop Both Eyes QHS  . morphine      . mupirocin ointment  1 application Nasal BID  . pantoprazole  40 mg Oral BID   Continuous Infusions: . sodium chloride 100 mL/hr at 08/11/18 0838  . methocarbamol (ROBAXIN) IV    . piperacillin-tazobactam (ZOSYN)  IV 2.25 g (08/11/18 1301)   PRN Meds:.acetaminophen **OR** acetaminophen, docusate sodium, fentaNYL (SUBLIMAZE) injection, methocarbamol (ROBAXIN) IV, ondansetron **OR** ondansetron (ZOFRAN) IV, promethazine  Allergies: No Known Allergies  Family History  Problem Relation Age of Onset  . Heart failure Mother   . Hypertension Mother   . Pneumonia Father   . Hypertension Brother   . Heart attack Neg Hx   . Stroke Neg Hx     Social History:  reports that he has never smoked. He has never used smokeless tobacco. He reports that he does not drink alcohol or use drugs.  ROS: A complete review of systems was performed.  All systems are negative except for pertinent findings as noted.  Physical Exam:  Vital signs in last 24 hours: Temp:  [97.6 F (36.4 C)-97.9 F (36.6 C)] 97.6 F (36.4 C) (01/13 1257) Pulse Rate:  [47-99] 86 (01/13 1257) Resp:  [10-22] 18 (01/13 1257) BP: (111-154)/(51-99) 147/71 (01/13 1257) SpO2:  [90 %-100 %] 95 % (01/13 1257) Weight:  [84.8 kg] 84.8 kg (01/12 2252) Constitutional:  Alert and oriented, very uncomfortable due to suprapubic pain Cardiovascular: Regular rate and rhythm, No JVD Respiratory: Normal respiratory effort, Lungs clear bilaterally GI: Abdomen is soft, nontender, nondistended, no abdominal masses GU: No CVA  tenderness, penis is uncircumcised, both testicle are descended and have no masses or lesions.  Lymphatic: No lymphadenopathy Neurologic: Grossly intact, no focal deficits Psychiatric: Normal mood and affect  Laboratory Data:  Recent Labs    08/04/2018 2345 08/11/18 0500  WBC 6.8 7.6  HGB 9.8* 10.1*  HCT 32.9* 34.0*  PLT 179 205    Recent Labs    08/28/2018 2345 08/11/18 0500  NA 138 141  K 5.1 4.9  CL 108 107  GLUCOSE 137* 147*  BUN 68* 68*  CALCIUM 8.5* 8.5*  CREATININE 3.88* 3.61*     Results for orders placed or performed during the hospital encounter of 08/17/2018 (from the past 24 hour(s))  Lipase, blood     Status: None   Collection Time: 08/06/2018 11:45 PM  Result Value Ref Range   Lipase 51 11 - 51 U/L  Comprehensive metabolic panel     Status: Abnormal   Collection Time: 08/22/2018 11:45 PM  Result Value Ref Range   Sodium 138 135 - 145 mmol/L   Potassium 5.1 3.5 - 5.1 mmol/L   Chloride 108 98 - 111 mmol/L   CO2 23 22 - 32 mmol/L   Glucose, Bld 137 (H) 70 - 99 mg/dL   BUN 68 (H) 8 - 23 mg/dL   Creatinine, Ser 3.88 (H) 0.61 - 1.24 mg/dL   Calcium 8.5 (L) 8.9 - 10.3 mg/dL   Total Protein 6.9 6.5 - 8.1 g/dL   Albumin 3.5 3.5 - 5.0 g/dL   AST 17 15 - 41 U/L   ALT 16 0 - 44 U/L   Alkaline Phosphatase 51 38 - 126 U/L   Total Bilirubin 0.6 0.3 - 1.2 mg/dL   GFR calc non Af Amer 13 (L) >60 mL/min   GFR calc Af Amer 15 (L) >60 mL/min   Anion gap 7 5 - 15  CBC     Status: Abnormal   Collection Time: 08/26/2018 11:45 PM  Result Value Ref Range   WBC 6.8 4.0 - 10.5 K/uL   RBC 3.49 (L) 4.22 - 5.81 MIL/uL   Hemoglobin 9.8 (L) 13.0 - 17.0 g/dL   HCT 32.9 (L) 39.0 - 52.0 %   MCV 94.3 80.0 - 100.0 fL   MCH 28.1 26.0 - 34.0 pg   MCHC 29.8 (L) 30.0 - 36.0 g/dL   RDW 12.9 11.5 - 15.5 %   Platelets 179 150 - 400 K/uL   nRBC 0.0 0.0 - 0.2 %  Type and screen     Status: None   Collection Time: 08/01/2018 11:45 PM  Result Value Ref Range   ABO/RH(D) AB POS    Antibody  Screen NEG    Sample Expiration      08/13/2018 Performed at [facility], [place] [address] [place], [place] [address]   Urinalysis, Routine w reflex microscopic     Status: Abnormal   Collection Time: 08/11/18  4:41 AM  Result Value Ref Range   Color, Urine YELLOW YELLOW   APPearance CLOUDY (A) CLEAR   Specific Gravity, Urine 1.013 1.005 - 1.030   pH 6.0 5.0 - 8.0   Glucose, UA NEGATIVE NEGATIVE mg/dL   Hgb urine dipstick SMALL (A) NEGATIVE   Bilirubin Urine NEGATIVE NEGATIVE   Ketones, ur NEGATIVE NEGATIVE mg/dL   Protein, ur 30 (A) NEGATIVE mg/dL   Nitrite NEGATIVE NEGATIVE   Leukocytes, UA MODERATE (A) NEGATIVE   RBC / HPF 6-10 0 - 5 RBC/hpf   WBC, UA >50 (H) 0 - 5 WBC/hpf   Bacteria, UA RARE (A) NONE SEEN   Squamous Epithelial / LPF 0-5 0 - 5   WBC Clumps PRESENT   CBC     Status: Abnormal   Collection Time: 08/11/18  5:00 AM  Result Value Ref Range   WBC 7.6 4.0 - 10.5 K/uL   RBC 3.67 (L) 4.22 - 5.81 MIL/uL   Hemoglobin 10.1 (L) 13.0 - 17.0 g/dL   HCT 34.0 (L) 39.0 - 52.0 %   MCV 92.6 80.0 - 100.0 fL   MCH 27.5 26.0 - 34.0 pg   MCHC 29.7 (L) 30.0 - 36.0 g/dL   RDW 12.8 11.5 - 15.5 %   Platelets 205 150 - 400 K/uL   nRBC 0.0 0.0 - 0.2 %  Comprehensive metabolic  panel     Status: Abnormal   Collection Time: 08/11/18  5:00 AM  Result Value Ref Range   Sodium 141 135 - 145 mmol/L   Potassium 4.9 3.5 - 5.1 mmol/L   Chloride 107 98 - 111 mmol/L   CO2 23 22 - 32 mmol/L   Glucose, Bld 147 (H) 70 - 99 mg/dL   BUN 68 (H) 8 - 23 mg/dL   Creatinine, Ser 3.61 (H) 0.61 - 1.24 mg/dL   Calcium 8.5 (L) 8.9 - 10.3 mg/dL   Total Protein 6.9 6.5 - 8.1 g/dL   Albumin 3.7 3.5 - 5.0 g/dL   AST 23 15 - 41 U/L   ALT 18 0 - 44 U/L   Alkaline Phosphatase 51 38 - 126 U/L   Total Bilirubin 0.8 0.3 - 1.2 mg/dL   GFR calc non Af Amer 14 (L) >60 mL/min   GFR calc Af Amer 16 (L) >60 mL/min   Anion gap 11 5 - 15  MRSA PCR Screening     Status: Abnormal   Collection  Time: 08/11/18  9:00 AM  Result Value Ref Range   MRSA by PCR POSITIVE (A) NEGATIVE  Lactic acid, plasma     Status: Abnormal   Collection Time: 08/11/18 11:36 AM  Result Value Ref Range   Lactic Acid, Venous 3.4 (HH) 0.5 - 1.9 mmol/L  Protime-INR     Status: None   Collection Time: 08/11/18  1:59 PM  Result Value Ref Range   Prothrombin Time 14.7 11.4 - 15.2 seconds   INR 1.16    Recent Results (from the past 240 hour(s))  MRSA PCR Screening     Status: Abnormal   Collection Time: 08/11/18  9:00 AM  Result Value Ref Range Status   MRSA by PCR POSITIVE (A) NEGATIVE Final    Comment:        The GeneXpert MRSA Assay (FDA approved for NASAL specimens only), is one component of a comprehensive MRSA colonization surveillance program. It is not intended to diagnose MRSA infection nor to guide or monitor treatment for MRSA infections. RESULT CALLED TO, READ BACK BY AND VERIFIED WITH: BLOCK,D. RN AT 1108 08/11/18 MULLINS,T Performed at [facility], [place] [address] [place], [place] [address]     Renal Function: Recent Labs    08/20/2018 2345 08/11/18 0500  CREATININE 3.88* 3.61*   Estimated Creatinine Clearance: 15.5 mL/min (A) (by C-G formula based on SCr of 3.61 mg/dL (H)).  Radiologic Imaging: Ct Abdomen Pelvis Wo Contrast  Result Date: 08/11/2018 CLINICAL DATA:  Abdominal pain, nausea, and vomiting. EXAM: CT ABDOMEN AND PELVIS WITHOUT CONTRAST TECHNIQUE: Multidetector CT imaging of the abdomen and pelvis was performed following the standard protocol without IV contrast. COMPARISON:  None. FINDINGS: Lower chest: Atelectasis in the lung bases. Cardiac enlargement. Coronary artery calcifications. Hepatobiliary: No focal liver lesions. Gallbladder is distended with stone in the region of the gallbladder neck. Increased density of the bile suggesting sludge. No inflammatory infiltration or wall thickening. No bile duct dilatation. Pancreas: Unremarkable. No  pancreatic ductal dilatation or surrounding inflammatory changes. Spleen: Normal in size without focal abnormality. Adrenals/Urinary Tract: No adrenal gland nodules. Punctate stone in the midpole right kidney. No hydronephrosis or hydroureter. Bladder is somewhat decompressed. There is diffuse bladder wall thickening with multiple bladder diverticula. No stones. Stomach/Bowel: Stomach is within normal limits. Appendix appears normal. No evidence of bowel wall thickening, distention, or inflammatory changes. Colonic diverticula without evidence of diverticulitis. Vascular/Lymphatic: Aortic atherosclerosis. No  enlarged abdominal or pelvic lymph nodes. Reproductive: Prostate is unremarkable. Other: No abdominal wall hernia or abnormality. No abdominopelvic ascites. Musculoskeletal: Degenerative changes in the spine. IMPRESSION: 1. Distended gallbladder with stone in the region of the gallbladder neck. Increased density of the bile suggesting sludge. No inflammatory changes. 2. Diffuse bladder wall thickening with multiple bladder diverticula consistent with chronic bladder outlet obstruction. Aortic Atherosclerosis (ICD10-I70.0). Electronically Signed   By: Lucienne Capers M.D.   On: 08/11/2018 01:22   US Abdomen Limited Ruq  Result Date: 08/11/2018 CLINICAL DATA:  Epigastric pain EXAM: ULTRASOUND ABDOMEN LIMITED RIGHT UPPER QUADRANT COMPARISON:  CT abdomen/pelvis dated 08/11/2018 at 0046 hours FINDINGS: Gallbladder: Poorly visualized/evaluated due to patient discomfort. Positive sonographic Murphy's sign. Common bile duct: Not discretely visualized. Liver: At the upper limits of normal for parenchymal echogenicity. No focal hepatic lesion is seen. Portal vein is patent on color Doppler imaging with normal direction of blood flow towards the liver. IMPRESSION: Gallbladder is poorly visualized on ultrasound. Positive sonographic Murphy's sign. However, there were no pericholecystic inflammatory changes on CT to  suggest acute cholecystitis. If there is continued clinical concern, consider hepatobiliary nuclear medicine scan. Electronically Signed   By: Julian Hy M.D.   On: 08/11/2018 02:25    I independently reviewed the above imaging studies.  Impression/Recommendation Acute urinary retention likely secondary to BPH with bladder outlet obstruction Acute cystitis Acute on chronic renal failure- no hydronephrosis noted on his recent CT  -16 F coude catheter placed at bedside.  Some resistance was met in the prostatic urethra. Urine specimen sent for culture.  Abx per primary team.  -Start tamsulosin once daily -He can follow-up in 1-2 weeks for a cystoscopy and voiding trial  Ellison Hughs, MD [facility] 08/11/2018, 2:34 PM

## 2018-08-11 NOTE — ED Notes (Signed)
Bradley [name] POA [phone]. Call with results and care plan.

## 2018-08-11 NOTE — Progress Notes (Signed)
Admitted earlier today. Discussed with Surgical team and IR.  HIDA Scan planned for tomorrow as patient is on narcotics for pain. IR will review available scans and also advise.  Positive Murphy's sign.  Will check lactic acid.  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 States that he was ok until he ate dinner last night, at which point he started having severe right sided abdominal pain. Associated with nausea and vomiting. Thinks that he may have had diarrhea last night. Denies fever or chills. Pain constant and gradually worsening, therefore he and his wife called EMS. States that he thinks he's had similar pain in the past, although never as severe. He is currently still writing in pain, cannot get comfortable".

## 2018-08-11 NOTE — ED Notes (Signed)
Josh EMT assisted patient to void-only voided 25 cc's yellow urine

## 2018-08-11 NOTE — H&P (Signed)
History and Physical    Bradley [name] [id number] DOB: August 22, 1927 DOA: 08/23/2018  PCP: [name], MD  Patient coming from: Home  I have personally briefly reviewed patient's old medical records in [place]  Chief Complaint: Abd pain, N/V  HPI: Bradley [name] is a 85 y.o. male with medical history significant of BPH, ICM with EF 23%, CKD stage 4.  HTN.  Bleeding duodenal ulcer.  Patient presents to the ED with c/o N/V and RUQ abd pain.  Symptoms onset ~5 hours ago, worsening.  Onset after eating dinner.  Multiple episodes of vomiting, some episodes with blood in them earlier.  Palpation and movement makes symptoms worse.  No fever.   ED Course: Vomiting in ED but vomit is non-bloody.  Hemoccult negative and HGB 9.8 (near baseline).  LFTs nl.  Creat 3.88 BUN 68 (up from ~3.0 and 44 baseline).  CT abd/pelvis shows distended gallbladder full of sludge, stone in neck of gallbladder, no inflammatory changes.  RUQ [place] pending.  Patient started on zosyn, fentanyl for pain control.   Review of Systems: As per HPI otherwise 10 point review of systems negative.   Past Medical History:  Diagnosis Date  . Arthritis   . BPH (benign prostatic hypertrophy)   . Cardiomyopathy ([place])    a. thought to be ischemic with high risk MPS EF 23%. Reluctant to do cath due to CKD  . Cataract   . Chronic kidney disease   . Dysrhythmia   . GERD (gastroesophageal reflux disease)   . Hypertension   . PPD positive    a. HX POSITIVE PPD WITH NEGATIVE CXR  . Status post placement of cardiac pacemaker    a. 04/2015: bradycardic arrest s/p Biotronik serial #[id number] pacemaker.  . Strabismus    a. right eye  . Symptomatic bradycardia    a. s/p Biotronik serial [id number] pacemaker.  . Syncope and collapse    a. in 2014 and again in 08/2014. Thought to be vasovagal   . Varicosities of leg     Past Surgical History:  Procedure Laterality Date  . CATARACT EXTRACTION W/ INTRAOCULAR  LENS IMPLANT     BOTH EYES  . EP IMPLANTABLE DEVICE N/A 05/13/2015   Procedure: Pacemaker Implant;  Surgeon: Evans Lance, MD;  Location: [facility];  Service: Cardiovascular;  Laterality: N/A;  . ESOPHAGOGASTRODUODENOSCOPY (EGD) WITH PROPOFOL Left 10/04/2017   Procedure: ESOPHAGOGASTRODUODENOSCOPY (EGD) WITH PROPOFOL;  Surgeon: Ronnette Juniper, MD;  Location: [facility];  Service: Gastroenterology;  Laterality: Left;  . IR ANGIOGRAM SELECTIVE EACH ADDITIONAL VESSEL  10/04/2017  . IR ANGIOGRAM VISCERAL SELECTIVE  10/04/2017  . IR EMBO ART  VEN HEMORR LYMPH EXTRAV  INC GUIDE ROADMAPPING  10/04/2017  . IR US GUIDE VASC ACCESS RIGHT  10/04/2017  . TONSILLECTOMY     "I was young" (10/15/2012)  . TOTAL KNEE ARTHROPLASTY Left 12/01/2012   Procedure: LEFT TOTAL KNEE ARTHROPLASTY;  Surgeon: [name], MD;  Location: [facility];  Service: Orthopedics;  Laterality: Left;  . TRANSURETHRAL RESECTION OF PROSTATE  2006     reports that he has never smoked. He has never used smokeless tobacco. He reports that he does not drink alcohol or use drugs.  No Known Allergies  Family History  Problem Relation Age of Onset  . Heart failure Mother   . Hypertension Mother   . Pneumonia Father   . Hypertension Brother   . Heart attack Neg Hx   . Stroke Neg Hx  Prior to Admission medications   Medication Sig Start Date End Date Taking? Authorizing Provider  acetaminophen (TYLENOL) 500 MG tablet Take 500 mg by mouth at bedtime as needed for headache (pain).   Yes [provider]  aspirin EC 81 MG tablet Take 1 tablet (81 mg total) by mouth daily. If hemoglobin is stable 10/17/17  Yes Dhungel, Nishant, MD  carvedilol (COREG) 6.25 MG tablet Take 6.25 mg by mouth 2 (two) times daily with a meal.    Yes [provider]  cholecalciferol (VITAMIN D) 1000 UNITS tablet Take 1,000 Units by mouth 2 (two) times daily.   Yes [provider]  docusate sodium (COLACE) 100 MG capsule Take 100 mg  by mouth 2 (two) times daily as needed for mild constipation.   Yes [provider]  dorzolamide-timolol (COSOPT) 22.3-6.8 MG/ML ophthalmic solution Place 1 drop into both eyes 2 (two) times daily.  08/21/14  Yes [provider]  furosemide (LASIX) 40 MG tablet Take 1 tablet (40 mg total) by mouth every other day. 12/20/17  Yes Isaiah Serge, NP  latanoprost (XALATAN) 0.005 % ophthalmic solution Place 1 drop into both eyes at bedtime.   Yes [provider]  nitroGLYCERIN (NITROSTAT) 0.4 MG SL tablet Place 0.4 mg under the tongue every 5 (five) minutes as needed for chest pain (max 3 doses within 15 minutes call 911).   Yes [provider]  pantoprazole (PROTONIX) 40 MG tablet Take 1 tablet (40 mg total) by mouth 2 (two) times daily. 10/10/17 10/10/18 Yes Dhungel, Flonnie Overman, MD    Physical Exam: Vitals:   08/06/2018 2252 08/05/2018 2347  BP: (!) 111/99 124/67  Pulse: 61 60  Resp: 17 17  Temp: 97.9 F (36.6 C)   TempSrc: Oral   SpO2: 98% 90%  Weight: 84.8 kg   Height: 6' (1.829 m)     Constitutional: NAD, calm, comfortable Eyes: Strabismus of R eye ENMT: Mucous membranes are moist. Posterior pharynx clear of any exudate or lesions.Normal dentition.  Neck: normal, supple, no masses, no thyromegaly Respiratory: clear to auscultation bilaterally, no wheezing, no crackles. Normal respiratory effort. No accessory muscle use.  Cardiovascular: Regular rate and rhythm, no murmurs / rubs / gallops. No extremity edema. 2+ pedal pulses. No carotid bruits.  Abdomen: TTP RUQ, no guarding no rebound Musculoskeletal: no clubbing / cyanosis. No joint deformity upper and lower extremities. Good ROM, no contractures. Normal muscle tone.  Skin: no rashes, lesions, ulcers. No induration Neurologic: CN 2-12 grossly intact. Sensation intact, DTR normal. Strength 5/5 in all 4.  Psychiatric: Normal judgment and insight. Alert and oriented x 3. Normal mood.    Labs on Admission: I  have personally reviewed following labs and imaging studies  CBC: Recent Labs  Lab 08/28/2018 2345  WBC 6.8  HGB 9.8*  HCT 32.9*  MCV 94.3  PLT 409   Basic Metabolic Panel: Recent Labs  Lab 08/04/2018 2345  NA 138  K 5.1  CL 108  CO2 23  GLUCOSE 137*  BUN 68*  CREATININE 3.88*  CALCIUM 8.5*   GFR: Estimated Creatinine Clearance: 14.4 mL/min (A) (by C-G formula based on SCr of 3.88 mg/dL (H)). Liver Function Tests: Recent Labs  Lab 08/21/2018 2345  AST 17  ALT 16  ALKPHOS 51  BILITOT 0.6  PROT 6.9  ALBUMIN 3.5   Recent Labs  Lab 08/04/2018 2345  LIPASE 51   No results for input(s): AMMONIA in the last 168 hours. Coagulation Profile: No results for  input(s): INR, PROTIME in the last 168 hours. Cardiac Enzymes: No results for input(s): CKTOTAL, CKMB, CKMBINDEX, TROPONINI in the last 168 hours. BNP (last 3 results) No results for input(s): PROBNP in the last 8760 hours. HbA1C: No results for input(s): HGBA1C in the last 72 hours. CBG: No results for input(s): GLUCAP in the last 168 hours. Lipid Profile: No results for input(s): CHOL, HDL, LDLCALC, TRIG, CHOLHDL, LDLDIRECT in the last 72 hours. Thyroid Function Tests: No results for input(s): TSH, T4TOTAL, FREET4, T3FREE, THYROIDAB in the last 72 hours. Anemia Panel: No results for input(s): VITAMINB12, FOLATE, FERRITIN, TIBC, IRON, RETICCTPCT in the last 72 hours. Urine analysis:    Component Value Date/Time   COLORURINE YELLOW 09/22/2017 2150   APPEARANCEUR CLOUDY (A) 09/22/2017 2150   LABSPEC 1.010 09/22/2017 2150   PHURINE 6.0 09/22/2017 2150   GLUCOSEU NEGATIVE 09/22/2017 2150   HGBUR NEGATIVE 09/22/2017 2150   BILIRUBINUR NEGATIVE 09/22/2017 2150   KETONESUR NEGATIVE 09/22/2017 2150   PROTEINUR NEGATIVE 09/22/2017 2150   UROBILINOGEN 0.2 09/21/2014 1402   NITRITE NEGATIVE 09/22/2017 2150   LEUKOCYTESUR MODERATE (A) 09/22/2017 2150    Radiological Exams on Admission: Ct Abdomen Pelvis Wo  Contrast  Result Date: 08/11/2018 CLINICAL DATA:  Abdominal pain, nausea, and vomiting. EXAM: CT ABDOMEN AND PELVIS WITHOUT CONTRAST TECHNIQUE: Multidetector CT imaging of the abdomen and pelvis was performed following the standard protocol without IV contrast. COMPARISON:  None. FINDINGS: Lower chest: Atelectasis in the lung bases. Cardiac enlargement. Coronary artery calcifications. Hepatobiliary: No focal liver lesions. Gallbladder is distended with stone in the region of the gallbladder neck. Increased density of the bile suggesting sludge. No inflammatory infiltration or wall thickening. No bile duct dilatation. Pancreas: Unremarkable. No pancreatic ductal dilatation or surrounding inflammatory changes. Spleen: Normal in size without focal abnormality. Adrenals/Urinary Tract: No adrenal gland nodules. Punctate stone in the midpole right kidney. No hydronephrosis or hydroureter. Bladder is somewhat decompressed. There is diffuse bladder wall thickening with multiple bladder diverticula. No stones. Stomach/Bowel: Stomach is within normal limits. Appendix appears normal. No evidence of bowel wall thickening, distention, or inflammatory changes. Colonic diverticula without evidence of diverticulitis. Vascular/Lymphatic: Aortic atherosclerosis. No enlarged abdominal or pelvic lymph nodes. Reproductive: Prostate is unremarkable. Other: No abdominal wall hernia or abnormality. No abdominopelvic ascites. Musculoskeletal: Degenerative changes in the spine. IMPRESSION: 1. Distended gallbladder with stone in the region of the gallbladder neck. Increased density of the bile suggesting sludge. No inflammatory changes. 2. Diffuse bladder wall thickening with multiple bladder diverticula consistent with chronic bladder outlet obstruction. Aortic Atherosclerosis (ICD10-I70.0). Electronically Signed   By: Lucienne Capers M.D.   On: 08/11/2018 01:22    EKG: Independently reviewed.  Assessment/Plan Principal Problem:    Acute calculous cholecystitis Active Problems:   Chronic kidney disease, stage IV (severe) ([facility])   Chronic combined systolic (congestive) and diastolic (congestive) heart failure ([place])    1. Abd pain - suspicious picture for acute calculous cholecystitis, DDx includes ulcer 1. Empiric zosyn 2. RUQ [place] pending 3. NPO 4. Fentanyl PRN pain, zofran PRN nausea 5. Repeat CBC, CMP tomorrow AM 6. General surgery consult in AM 7. Continue PPI therapy BID that he has been on since prior ulcer diagnosis 2. CKD stage 4 - 1. Creat slightly up to 3.88 from prior 3.0 values last year 2. Hold lasix 3. Gentle hydration, IVF: NS at 100 3. CHF - 1. Holding lasix and hydrating as above 2. Watch for evidence of fluid overload  DVT prophylaxis: SCDs - report of  hematemesis episode at home Code Status: Full Family Communication: no family in room Disposition Plan: Home after admit Consults called: None Admission status: Place in obs   , [facility] Pager [phone] Only works nights!  If 7AM-7PM, please contact the primary day team physician taking care of patient  [email or web address] Password [email or web address]  08/11/2018, 2:05 AM

## 2018-08-11 NOTE — Progress Notes (Signed)
Patient having trouble urinating.  Bladder scan showed 413. Surgery at the bedside ordered foley catheter.  Foley catheter attempt unsuccessful.  4th floor Charge RN attempted 2 coude insertion attempts, and thought that patient might have a stricture.  Requested urology consult.

## 2018-08-11 NOTE — Progress Notes (Signed)
Patient's lactic acid elevated Dr. Marthenia Rolling notified with request to pass on the surgery team.  Surgery team came by to see patient and look at vitals.

## 2018-08-11 NOTE — Consult Note (Addendum)
Cardiology Consultation:   Patient ID: [name] MRN: [id number]; DOB: 10-07-1927  Admit date: 08/24/2018 Date of Consult: 08/11/2018  Primary Care Provider: [name], MD Primary Cardiologist: Sinclair Grooms, MD  Primary Electrophysiologist:  Cristopher Peru, MD  Dr. Lovena Le    Patient Profile:   Bradley [name] is a 85 y.o. male with a hx of CAD,  NSTEMI 09/2017 with pk troponin 21 chronic systolic HF- EF 19%, CKD, HTN, PPM, prior GI bleed and who is being seen today for the evaluation of pre-op eval for possible cholecystectomy at the request of Dr. Marthenia [facility].  History of Present Illness:   Bradley [name] with above hx and no known cardiac cath, conservative approach has been followed due to pt's fragile state.  A nuc study in 2016 revealed an old inf. Lateral MI.  No chest pain with NSTEMI in 09/2017 with acute GI bleed and Hgb 7, Cr was 3.26 as well.  Pt with chronic combined systolic and diastolic HF.   EF in 2014 was 45-50% and most recent echo was EF 25-30% and this was done before the last NSTEMI.   Along with MI in 09/2017 he had GI bleed with Duodenal ulcer discharged with ASA n plavix.  Has been doing well on follow up appts.   Now admitted 08/10/17 with N&V. And abd pain.  His hemoccult was neg on vomitus, Hgb was 9.8 which is near his baseline and Cr 3.88 up from 3.0 which is his baseline.    EKG a pacing and V sensing with inf. Lateral T wave inversions though improved from 09/2017.  I personally reviewed.    Na 141, K+ 4.9, BUN 68 and Cr 3.61  hgb 10, WBC 7.6 pltlts 205  + MRSA      CT abd  IMPRESSION: 1. Distended gallbladder with stone in the region of the gallbladder neck. Increased density of the bile suggesting sludge. No inflammatory changes. 2. Diffuse bladder wall thickening with multiple bladder diverticula consistent with chronic bladder outlet obstruction.  Aortic Atherosclerosis (ICD10  abd ultrasound IMPRESSION: Gallbladder is poorly  visualized on ultrasound. Positive sonographic Murphy's sign. However, there were no pericholecystic inflammatory changes on CT to suggest acute cholecystitis.  If there is continued clinical concern, consider hepatobiliary nuclear medicine scan.  Currently sitting on side of bed in pain.  abd pain.  He can barely answer questions due to pain.  No chest pain, not SOB.  + abd pain.    Past Medical History:  Diagnosis Date  . Arthritis   . BPH (benign prostatic hypertrophy)   . Cardiomyopathy ([place])    a. thought to be ischemic with high risk MPS EF 23%. Reluctant to do cath due to CKD  . Cataract   . Chronic kidney disease   . Dysrhythmia   . GERD (gastroesophageal reflux disease)   . Hypertension   . PPD positive    a. HX POSITIVE PPD WITH NEGATIVE CXR  . Status post placement of cardiac pacemaker    a. 04/2015: bradycardic arrest s/p Biotronik serial #[id number] pacemaker.  . Strabismus    a. right eye  . Symptomatic bradycardia    a. s/p Biotronik serial [id number] pacemaker.  . Syncope and collapse    a. in 2014 and again in 08/2014. Thought to be vasovagal   . Varicosities of leg     Past Surgical History:  Procedure Laterality Date  . CATARACT EXTRACTION W/ INTRAOCULAR LENS IMPLANT     BOTH EYES  .  EP IMPLANTABLE DEVICE N/A 05/13/2015   Procedure: Pacemaker Implant;  Surgeon: Evans Lance, MD;  Location: [facility];  Service: Cardiovascular;  Laterality: N/A;  . ESOPHAGOGASTRODUODENOSCOPY (EGD) WITH PROPOFOL Left 10/04/2017   Procedure: ESOPHAGOGASTRODUODENOSCOPY (EGD) WITH PROPOFOL;  Surgeon: Ronnette Juniper, MD;  Location: [facility];  Service: Gastroenterology;  Laterality: Left;  . IR ANGIOGRAM SELECTIVE EACH ADDITIONAL VESSEL  10/04/2017  . IR ANGIOGRAM VISCERAL SELECTIVE  10/04/2017  . IR EMBO ART  VEN HEMORR LYMPH EXTRAV  INC GUIDE ROADMAPPING  10/04/2017  . IR US GUIDE VASC ACCESS RIGHT  10/04/2017  . TONSILLECTOMY     "I was young" (10/15/2012)  . TOTAL KNEE  ARTHROPLASTY Left 12/01/2012   Procedure: LEFT TOTAL KNEE ARTHROPLASTY;  Surgeon: [name], MD;  Location: [facility];  Service: Orthopedics;  Laterality: Left;  . TRANSURETHRAL RESECTION OF PROSTATE  2006     Home Medications:  Prior to Admission medications   Medication Sig Start Date End Date Taking? Authorizing Provider  acetaminophen (TYLENOL) 500 MG tablet Take 500 mg by mouth at bedtime as needed for headache (pain).   Yes [provider]  aspirin EC 81 MG tablet Take 1 tablet (81 mg total) by mouth daily. If hemoglobin is stable 10/17/17  Yes Dhungel, Nishant, MD  carvedilol (COREG) 6.25 MG tablet Take 6.25 mg by mouth 2 (two) times daily with a meal.    Yes [provider]  cholecalciferol (VITAMIN D) 1000 UNITS tablet Take 1,000 Units by mouth 2 (two) times daily.   Yes [provider]  docusate sodium (COLACE) 100 MG capsule Take 100 mg by mouth 2 (two) times daily as needed for mild constipation.   Yes [provider]  dorzolamide-timolol (COSOPT) 22.3-6.8 MG/ML ophthalmic solution Place 1 drop into both eyes 2 (two) times daily.  08/21/14  Yes [provider]  furosemide (LASIX) 40 MG tablet Take 1 tablet (40 mg total) by mouth every other day. 12/20/17  Yes Isaiah Serge, NP  latanoprost (XALATAN) 0.005 % ophthalmic solution Place 1 drop into both eyes at bedtime.   Yes [provider]  nitroGLYCERIN (NITROSTAT) 0.4 MG SL tablet Place 0.4 mg under the tongue every 5 (five) minutes as needed for chest pain (max 3 doses within 15 minutes call 911).   Yes [provider]  pantoprazole (PROTONIX) 40 MG tablet Take 1 tablet (40 mg total) by mouth 2 (two) times daily. 10/10/17 10/10/18 Yes Dhungel, Nishant, MD    Inpatient Medications: Scheduled Meds: . aspirin EC  81 mg Oral Daily  . carvedilol  6.25 mg Oral BID WC  . Chlorhexidine Gluconate Cloth  6 each Topical Q0600  . cholecalciferol  1,000 Units Oral BID  .  dorzolamide-timolol  1 drop Both Eyes BID  . latanoprost  1 drop Both Eyes QHS  . morphine      . mupirocin ointment  1 application Nasal BID  . pantoprazole  40 mg Oral BID   Continuous Infusions: . sodium chloride 100 mL/hr at 08/11/18 0838  . methocarbamol (ROBAXIN) IV    . piperacillin-tazobactam (ZOSYN)  IV     PRN Meds: acetaminophen **OR** acetaminophen, docusate sodium, fentaNYL (SUBLIMAZE) injection, methocarbamol (ROBAXIN) IV, ondansetron **OR** ondansetron (ZOFRAN) IV, promethazine  Allergies:   No Known Allergies  Social History:   Social History   Socioeconomic History  . Marital status: Married    Spouse name: Not on file  . Number of children: Not on file  .  Years of education: Not on file  . Highest education level: Not on file  Occupational History  . Occupation: Retired  Scientific laboratory technician  . Financial resource strain: Not on file  . Food insecurity:    Worry: Not on file    Inability: Not on file  . Transportation needs:    Medical: Not on file    Non-medical: Not on file  Tobacco Use  . Smoking status: Never Smoker  . Smokeless tobacco: Never Used  Substance and Sexual Activity  . Alcohol use: No  . Drug use: No  . Sexual activity: Never  Lifestyle  . Physical activity:    Days per week: Not on file    Minutes per session: Not on file  . Stress: Not on file  Relationships  . Social connections:    Talks on phone: Not on file    Gets together: Not on file    Attends religious service: Not on file    Active member of club or organization: Not on file    Attends meetings of clubs or organizations: Not on file    Relationship status: Not on file  . Intimate partner violence:    Fear of current or ex partner: Not on file    Emotionally abused: Not on file    Physically abused: Not on file    Forced sexual activity: Not on file  Other Topics Concern  . Not on file  Social History Narrative   Lives with his wife.    Family History:    Family  History  Problem Relation Age of Onset  . Heart failure Mother   . Hypertension Mother   . Pneumonia Father   . Hypertension Brother   . Heart attack Neg Hx   . Stroke Neg Hx      ROS:  Please see the history of present illness.  He stated he was doing well until after dinner.  General:no colds or fevers, no weight changes Skin:no rashes or ulcers HEENT:no blurred vision, no congestion CV:see HPI PUL:see HPI GI:no diarrhea constipation or melena, no indigestion GU:no hematuria, no dysuria MS:no joint pain, no claudication Neuro:no syncope, no lightheadedness Endo:no diabetes, no thyroid disease  All other ROS reviewed and negative.     Physical Exam/Data:   Vitals:   08/11/18 0513 08/11/18 0600 08/11/18 0700 08/11/18 0831  BP: 133/69 (!) 142/75 (!) 154/51 (!) 152/92  Pulse: 73  90 99  Resp: 19 12 (!) 22   Temp:    97.7 F (36.5 C)  TempSrc:      SpO2: 100%  100% 96%  Weight:      Height:        Intake/Output Summary (Last 24 hours) at 08/11/2018 1205 Last data filed at 08/11/2018 0442 Gross per 24 hour  Intake -  Output 25 ml  Net -25 ml   Last 3 Weights 08/26/2018 06/24/2018 12/20/2017  Weight (lbs) 187 lb 187 lb 12.8 oz 185 lb 8 oz  Weight (kg) 84.823 kg 85.186 kg 84.142 kg     Body mass index is 25.36 kg/m.  General:  Well nourished, well developed, in no acute distress HEENT: normal Lymph: no adenopathy Neck: no JVD Endocrine:  No thryomegaly Vascular: No carotid bruits; pedal pulses 1+ bilaterally  Cardiac:  normal S1, S2; RRR; no murmur, gallup rub or click  Lungs:  clear to auscultation bilaterally, no wheezing, rhonchi or rales  Abd: soft, +tenderness to touch, very uncomfortable, no hepatomegaly  Ext: no  edema Musculoskeletal:  No deformities, BUE and BLE strength normal and equal Skin: warm and dry  Neuro:  Alert and oriented but having pain and difficult to answer questions.  no focal abnormalities noted Psych:  Normal affect    Telemetry:   Telemetry Not on tele  Relevant CV Studies: Echo 09/23/17   Study Conclusions  - Left ventricle: LVEF is severely depressed at approximately 30%   with diffuse hypokinesis; inferior akinesis.. The cavity size was   normal. Wall thickness was increased in a pattern of mild LVH.   Systolic function was severely reduced. The estimated ejection   fraction was in the range of 25% to 30%. Doppler parameters are   consistent with abnormal left ventricular relaxation (grade 1   diastolic dysfunction). - Aortic valve: There was mild regurgitation. - Mitral valve: Calcified annulus. Mildly thickened leaflets . - Left atrium: The atrium was mildly dilated.  Impressions:  - NO significant change in LV function compared to report of 2016.  Laboratory Data:  Chemistry Recent Labs  Lab 08/26/2018 2345 08/11/18 0500  NA 138 141  K 5.1 4.9  CL 108 107  CO2 23 23  GLUCOSE 137* 147*  BUN 68* 68*  CREATININE 3.88* 3.61*  CALCIUM 8.5* 8.5*  GFRNONAA 13* 14*  GFRAA 15* 16*  ANIONGAP 7 11    Recent Labs  Lab 08/28/2018 2345 08/11/18 0500  PROT 6.9 6.9  ALBUMIN 3.5 3.7  AST 17 23  ALT 16 18  ALKPHOS 51 51  BILITOT 0.6 0.8   Hematology Recent Labs  Lab 08/07/2018 2345 08/11/18 0500  WBC 6.8 7.6  RBC 3.49* 3.67*  HGB 9.8* 10.1*  HCT 32.9* 34.0*  MCV 94.3 92.6  MCH 28.1 27.5  MCHC 29.8* 29.7*  RDW 12.9 12.8  PLT 179 205   Cardiac EnzymesNo results for input(s): TROPONINI in the last 168 hours. No results for input(s): TROPIPOC in the last 168 hours.  BNPNo results for input(s): BNP, PROBNP in the last 168 hours.  DDimer No results for input(s): DDIMER in the last 168 hours.  Radiology/Studies:  Ct Abdomen Pelvis Wo Contrast  Result Date: 08/11/2018 CLINICAL DATA:  Abdominal pain, nausea, and vomiting. EXAM: CT ABDOMEN AND PELVIS WITHOUT CONTRAST TECHNIQUE: Multidetector CT imaging of the abdomen and pelvis was performed following the standard protocol without IV contrast.  COMPARISON:  None. FINDINGS: Lower chest: Atelectasis in the lung bases. Cardiac enlargement. Coronary artery calcifications. Hepatobiliary: No focal liver lesions. Gallbladder is distended with stone in the region of the gallbladder neck. Increased density of the bile suggesting sludge. No inflammatory infiltration or wall thickening. No bile duct dilatation. Pancreas: Unremarkable. No pancreatic ductal dilatation or surrounding inflammatory changes. Spleen: Normal in size without focal abnormality. Adrenals/Urinary Tract: No adrenal gland nodules. Punctate stone in the midpole right kidney. No hydronephrosis or hydroureter. Bladder is somewhat decompressed. There is diffuse bladder wall thickening with multiple bladder diverticula. No stones. Stomach/Bowel: Stomach is within normal limits. Appendix appears normal. No evidence of bowel wall thickening, distention, or inflammatory changes. Colonic diverticula without evidence of diverticulitis. Vascular/Lymphatic: Aortic atherosclerosis. No enlarged abdominal or pelvic lymph nodes. Reproductive: Prostate is unremarkable. Other: No abdominal wall hernia or abnormality. No abdominopelvic ascites. Musculoskeletal: Degenerative changes in the spine. IMPRESSION: 1. Distended gallbladder with stone in the region of the gallbladder neck. Increased density of the bile suggesting sludge. No inflammatory changes. 2. Diffuse bladder wall thickening with multiple bladder diverticula consistent with chronic bladder outlet obstruction. Aortic Atherosclerosis (ICD10-I70.0). Electronically  Signed   By: Lucienne Capers M.D.   On: 08/11/2018 01:22   US Abdomen Limited Ruq  Result Date: 08/11/2018 CLINICAL DATA:  Epigastric pain EXAM: ULTRASOUND ABDOMEN LIMITED RIGHT UPPER QUADRANT COMPARISON:  CT abdomen/pelvis dated 08/11/2018 at 0046 hours FINDINGS: Gallbladder: Poorly visualized/evaluated due to patient discomfort. Positive sonographic Murphy's sign. Common bile duct: Not  discretely visualized. Liver: At the upper limits of normal for parenchymal echogenicity. No focal hepatic lesion is seen. Portal vein is patent on color Doppler imaging with normal direction of blood flow towards the liver. IMPRESSION: Gallbladder is poorly visualized on ultrasound. Positive sonographic Murphy's sign. However, there were no pericholecystic inflammatory changes on CT to suggest acute cholecystitis. If there is continued clinical concern, consider hepatobiliary nuclear medicine scan. Electronically Signed   By: Julian Hy M.D.   On: 08/11/2018 02:25    Assessment and Plan:   Pre-op eval in 85 year old male with known CAD by nuc study and no cardiac cath I can find- with + nuc plans were for medical therapy due to CKD.   Had NSTEMI in 09/2017 with pk troponin of 21.  No chest pain and with acute GI bleed and Hgb of 7.  Now with significant abd pain began last night after dinner.  Still with pain.     distended gallbladder with stone in region of gallbladder neck, no inflammatory changes.  abd U/S with + Murphy's sign. However, there were no pericholecystic inflammatory changes on CT to suggest acute cholecystitis.  Would be high risk - we do not know extent of CAD.  Possible repeat echo - will defer to Dr. Sallyanne Kuster.  1. Pt for either surgery or percutaneous cholecystostomy tube.  On IV ABX 2. CAD with NSTEMI in March.  No cath due to CKD 4.   3. Cardiomyopathy with EF 25-30%  G1 DD  Last echo was done before the NSTEMI.  4. Chronic systolic and diastolic HF.  stable. 5. CKD-4 Cr improved today from 3.88 to 3.61.  6. Urinary retention adding to abd pain.   7. PPM for sinus node dysfunction and syncope Biotronik       For questions or updates, please contact [facility] Please consult [email or web address] for contact info under     Signed, Cecilie Kicks, NP  08/11/2018 12:05 PM  I have seen and examined the patient along with Cecilie Kicks, NP .  I have reviewed the chart, notes  and new data.  I agree with NP's note.  Key new complaints: At this point he is exclusively preoccupied by his inability to urinate and is in moderate distress.  He denies any issues with upper abdominal pain and has not had recent nausea or vomiting.  He denies angina and dyspnea. Key examination changes: He is a little tachycardic but is again in distress due to his inability to empty his bladder.  Otherwise normal cardiovascular exam.  Pacifically there is no edema, jugular venous distention or gallop on auscultation.  His pacemaker site appears healthy. Key new findings / data: ECG shows atrial paced, ventricular sensed rhythm with left bundle branch block and is therefore not diagnostic.  Creatinine is elevated in the range of moderate to severe chronic renal insufficiency but is not much different from his baseline.  Note that WBC and liver function tests are completely normal.  While his CT does show a distended gallbladder with a possible impacted stone there is no surrounding inflammatory change.  His urinary bladder abnormality suggest chronic obstruction  PLAN: In view of his advanced age, multiple chronic illnesses, fairly recent myocardial infarction without revascularization, Bradley [name] is at high risk for major cardiovascular complications with abdominal surgery. At this point there is no clear evidence of acute cholecystitis.  A HIDA scan has been ordered. Right now, his symptom complex is clearly dominated by his urinary difficulties and he is in quite a bit of discomfort from this.  Reassess after a urinary catheter has been successfully placed.  Hopefully we can avoid surgery. Unfortunately, from a cardiac point of view there are no interventions that we can implement at this time that would reduce his surgical risk.  He is not a candidate for invasive coronary angiography due to his renal insufficiency.  Sanda Klein, MD, [facility] [phone] 08/11/2018, 2:37 PM

## 2018-08-11 NOTE — Progress Notes (Signed)
[name] pain  Subjective: Ongoing pain, lactate is up.  He says he feels better voiding, and had over 400 in his bladder scan, he has also had some incontinence.  He was not able to void much standing and first attempt at placing a foley failed.  Could not get past the prostate.  His pain is rather diffuse, but more tender on the right as best we can tell from exam.    Objective: Vital signs in last 24 hours: Temp:  [97.7 F (36.5 C)-97.9 F (36.6 C)] 97.7 F (36.5 C) (01/13 0831) Pulse Rate:  [47-99] 99 (01/13 0831) Resp:  [10-22] 22 (01/13 0700) BP: (111-154)/(51-99) 152/92 (01/13 0831) SpO2:  [90 %-100 %] 96 % (01/13 0831) Weight:  [84.8 kg] 84.8 kg (01/12 2252)  Nothing recorded except 25 cc urine for I/O Afebrile, BP is up Creatinine 3.88 >>3.61 LFT's normal WBC 6.8>>7.6 Lactate 3.4 CT 0122 hrs this AM:  Gallstone in the neck of the gallbladder, distended.Increased density of the bile suggesting sludge. No inflammatory changes.  Diffuse bladder wall thickening with multiple bladder diverticula consistent with chronic bladder outlet obstruction. HIDA ordered Urine > 50 WBC/HPF - No urine culture pending   Intake/Output from previous day: 01/12 0701 - 01/13 0700 In: -  Out: 25 [Urine:25] Intake/Output this shift: No intake/output data recorded.  General appearance: alert, cooperative and mild distress GI: tender all over, more so on the right than the left.  he points to lower abdomen now while they are trying to put in a foley.  He moves very well in bed and did well getting up to the side of the bed, back in bed too.   BP 147/71, T 97.6, RR 18 HR 86   Lab Results:  Recent Labs    08/09/2018 2345 08/11/18 0500  WBC 6.8 7.6  HGB 9.8* 10.1*  HCT 32.9* 34.0*  PLT 179 205    BMET Recent Labs    08/09/2018 2345 08/11/18 0500  NA 138 141  K 5.1 4.9  CL 108 107  CO2 23 23  GLUCOSE 137* 147*  BUN 68* 68*  CREATININE 3.88* 3.61*  CALCIUM 8.5* 8.5*    PT/INR No results for input(s): LABPROT, INR in the last 72 hours.  Recent Labs  Lab 08/16/2018 2345 08/11/18 0500  AST 17 23  ALT 16 18  ALKPHOS 51 51  BILITOT 0.6 0.8  PROT 6.9 6.9  ALBUMIN 3.5 3.7     Lipase     Component Value Date/Time   LIPASE 51 08/29/2018 2345     Medications: . aspirin EC  81 mg Oral Daily  . carvedilol  6.25 mg Oral BID WC  . Chlorhexidine Gluconate Cloth  6 each Topical Q0600  . cholecalciferol  1,000 Units Oral BID  . dorzolamide-timolol  1 drop Both Eyes BID  . latanoprost  1 drop Both Eyes QHS  . morphine      . mupirocin ointment  1 application Nasal BID  . pantoprazole  40 mg Oral BID    Assessment/Plan  CAD s/p NSTEMI 09/2017 Combined CHF - EF 25-30% on ECHO 08/2017, cardiologist Dr. Tamala Julian HTN S/p pacemaker - followed by Dr. Lovena Le AKI on CKD  RUQ pain, nausea, vomiting Cholelithiasis - Patient with RUQ pain and gallstones, although no clear signs of acute cholecystitis on imaging. LFTs WNL. Will order HIDA scan for further evaluation. Depending on results patient may need laparoscopic cholecystectomy vs percutaneous cholecystostomy tube, suspect perc chole. Due to  age and multiple medical problems, will ask cardiology to see for cardiac risk stratification. Continue IV antibiotics.    ID - zosyn 1/13>> VTE - SCDs FEN - IVF, NPO Foley - none  Plan:We have tried a straight foley, and they are going to try with a Coude catheter.  See if he get relief from that. Will review with Drs. Martin/ Dalbert Batman as soon as they are out of surgery.    LOS: 0 days    Bradley [name] 08/11/2018 [phone]

## 2018-08-11 NOTE — Progress Notes (Signed)
Attempted coude cath x2 with 14F and 65F foley. Unable to advance cath past prostate. RN made aware that patient would benefit from urology consult to place foley.  Daivon Rayos A

## 2018-08-12 ENCOUNTER — Ambulatory Visit (INDEPENDENT_AMBULATORY_CARE_PROVIDER_SITE_OTHER): Payer: Medicare Other

## 2018-08-12 ENCOUNTER — Observation Stay (HOSPITAL_COMMUNITY): Payer: Medicare Other

## 2018-08-12 DIAGNOSIS — I255 Ischemic cardiomyopathy: Secondary | ICD-10-CM

## 2018-08-12 DIAGNOSIS — I495 Sick sinus syndrome: Secondary | ICD-10-CM

## 2018-08-12 DIAGNOSIS — K8000 Calculus of gallbladder with acute cholecystitis without obstruction: Secondary | ICD-10-CM | POA: Diagnosis not present

## 2018-08-12 LAB — COMPREHENSIVE METABOLIC PANEL
ALBUMIN: 3.1 g/dL — AB (ref 3.5–5.0)
ALT: 320 U/L — ABNORMAL HIGH (ref 0–44)
ANION GAP: 10 (ref 5–15)
AST: 176 U/L — ABNORMAL HIGH (ref 15–41)
Alkaline Phosphatase: 86 U/L (ref 38–126)
BUN: 74 mg/dL — ABNORMAL HIGH (ref 8–23)
CALCIUM: 8.6 mg/dL — AB (ref 8.9–10.3)
CO2: 22 mmol/L (ref 22–32)
Chloride: 112 mmol/L — ABNORMAL HIGH (ref 98–111)
Creatinine, Ser: 3.87 mg/dL — ABNORMAL HIGH (ref 0.61–1.24)
GFR calc Af Amer: 15 mL/min — ABNORMAL LOW (ref >60)
GFR calc non Af Amer: 13 mL/min — ABNORMAL LOW (ref >60)
Glucose, Bld: 116 mg/dL — ABNORMAL HIGH (ref 70–99)
Potassium: 5 mmol/L (ref 3.5–5.1)
SODIUM: 144 mmol/L (ref 135–145)
TOTAL PROTEIN: 6.7 g/dL (ref 6.5–8.1)
Total Bilirubin: 1.5 mg/dL — ABNORMAL HIGH (ref 0.3–1.2)

## 2018-08-12 LAB — CBC WITH DIFFERENTIAL/PLATELET
Abs Immature Granulocytes: 0.1 10*3/uL — ABNORMAL HIGH (ref 0.00–0.07)
Basophils Absolute: 0 10*3/uL (ref 0.0–0.1)
Basophils Relative: 0 %
Eosinophils Absolute: 0 10*3/uL (ref 0.0–0.5)
Eosinophils Relative: 0 %
HCT: 37.6 % — ABNORMAL LOW (ref 39.0–52.0)
Hemoglobin: 11.2 g/dL — ABNORMAL LOW (ref 13.0–17.0)
Immature Granulocytes: 1 %
Lymphocytes Relative: 4 %
Lymphs Abs: 0.6 10*3/uL — ABNORMAL LOW (ref 0.7–4.0)
MCH: 28.1 pg (ref 26.0–34.0)
MCHC: 29.8 g/dL — ABNORMAL LOW (ref 30.0–36.0)
MCV: 94.5 fL (ref 80.0–100.0)
Monocytes Absolute: 1 10*3/uL (ref 0.1–1.0)
Monocytes Relative: 7 %
Neutro Abs: 13.4 10*3/uL — ABNORMAL HIGH (ref 1.7–7.7)
Neutrophils Relative %: 88 %
Platelets: 182 10*3/uL (ref 150–400)
RBC: 3.98 MIL/uL — ABNORMAL LOW (ref 4.22–5.81)
RDW: 13.2 % (ref 11.5–15.5)
WBC: 15.1 10*3/uL — ABNORMAL HIGH (ref 4.0–10.5)
nRBC: 0 % (ref 0.0–0.2)

## 2018-08-12 LAB — PHOSPHORUS: Phosphorus: 5.1 mg/dL — ABNORMAL HIGH (ref 2.5–4.6)

## 2018-08-12 LAB — URINE CULTURE: Culture: <10000 — AB

## 2018-08-12 LAB — MAGNESIUM: Magnesium: 2.4 mg/dL (ref 1.7–2.4)

## 2018-08-12 MED ORDER — TECHNETIUM TC 99M MEBROFENIN IV KIT
5.3000 | PACK | Freq: Once | INTRAVENOUS | Status: AC | PRN
  Administered 2018-08-12: 5.3 via INTRAVENOUS

## 2018-08-12 MED ORDER — HEPARIN SODIUM (PORCINE) 5000 UNIT/ML IJ SOLN
5000.0000 [IU] | Freq: Three times a day (TID) | INTRAMUSCULAR | Status: DC
  Administered 2018-08-12 – 2018-08-13 (×4): 5000 [IU] via SUBCUTANEOUS
  Filled 2018-08-12 (×4): qty 1

## 2018-08-12 NOTE — Progress Notes (Addendum)
HIDA is negative for cholecystitis.  No need for perc chole drain or surgical intervention.  He can have his diet advanced as tolerates.  No further surgical plans.  We will sign off and defer further care to primary service.  D/W Dr. Hassell Done.  Henreitta Cea 10:39 AM 08/12/2018

## 2018-08-12 NOTE — Progress Notes (Signed)
Patient Demographics:    Bradley [name], is a 85 y.o. male, DOB - 1927-08-30, MLY:[id number]  Admit date - 08/21/2018   Admitting Physician Etta Quill, DO  Outpatient Primary MD for the patient is [name], MD  LOS - 0   Chief Complaint  Patient presents with  . Abdominal Pain        Subjective:    Bradley [name] today has no fevers, no emesis,  No chest pain, wants to eat,  pain is better  Assessment  & Plan :    Principal Problem:   Acute calculous cholecystitis Active Problems:   Chronic kidney disease, stage IV (severe) (HCC)   Chronic combined systolic (congestive) and diastolic (congestive) heart failure (HCC)  Brief Summary Bradley A Arledgeis an 89yomale PMH CAD s/p NSTEMI 09/2017, combined CHF (EF 25-30% on ECHO 08/2017), HTN, s/p pacemaker, CKD admitted on 08/28/2018 with abdominal pain, nausea vomiting or concerns about possible acute cholecystitis however work-up was negative for acute sinusitis advance diet as tolerated as of 08/12/2018  Plan:- 1) abdominal pain--- CT abdomen and pelvis suggested gallbladder distention, cholelithiasis , gallbladder ultrasound was inconclusive, IR, surgical and cardiology consult requested, however on 08/12/2018 HIDA is negative for cholecystitis.  No need for perc chole drain or surgical intervention.  He can have his diet advanced as tolerates.  No further surgical plans.  ,  Okay to advance diet as per surgical team, continue Protonix may discontinue antibiotics, elevated LFTs noted repeat in a.m., bilirubin up to 1.5  2)HFrEF--- patient with history of combined chronic systolic and diastolic dysfunction CHF last known EF in the 25 to 30% range due to presumed ischemic cardiomyopathy, no evidence of acute exacerbation at this time, continue Coreg 6.25 mg twice daily, Lasix on hold due to nausea vomiting and n.p.o. status, decrease IV fluid now  that the patient is allowed to eat, be judicious with IV fluids to avoid volume overload, avoid ACEI/ARB/ARNI due to renal concerns  3)AKI----acute kidney injury on CKD stage - IV--due to nausea vomiting, poor oral intake/dehydration compounded by Lasix use prior to admission,    creatinine on admission= 3.88  ,   baseline creatinine = 2.63   , creatinine is now= 3.87     , renally adjust medications, avoid nephrotoxic agents/dehydration/hypotension, continue to hold LasixF   Disposition/Need for in-Hospital Stay- patient unable to be discharged at this time due to need to make sure patient tolerates diet without significant abdominal pain nausea or vomiting if he tolerates diet may discharge on 08/13/2018  Code Status : FULL  Family Communication:   na   Disposition Plan  : Get PT eval  Consults  : Surgery and cardiology  DVT Prophylaxis  :  - Heparin - SCDs   Lab Results  Component Value Date   PLT 182 08/12/2018    Inpatient Medications  Scheduled Meds: . aspirin EC  81 mg Oral Daily  . carvedilol  6.25 mg Oral BID WC  . Chlorhexidine Gluconate Cloth  6 each Topical Q0600  . cholecalciferol  1,000 Units Oral BID  . dorzolamide-timolol  1 drop Both Eyes BID  . latanoprost  1 drop Both Eyes QHS  . mupirocin ointment  1 application Nasal BID  . pantoprazole  40 mg Oral BID  . tamsulosin  0.4 mg Oral QPC supper   Continuous Infusions: . sodium chloride 50 mL/hr at 08/12/18 1252  . methocarbamol (ROBAXIN) IV    . piperacillin-tazobactam (ZOSYN)  IV 2.25 g (08/12/18 1303)   PRN Meds:.acetaminophen **OR** acetaminophen, docusate sodium, fentaNYL (SUBLIMAZE) injection, methocarbamol (ROBAXIN) IV, ondansetron **OR** ondansetron (ZOFRAN) IV    Anti-infectives (From admission, onward)   Start     Dose/Rate Route Frequency Ordered Stop   08/11/18 2100  piperacillin-tazobactam (ZOSYN) IVPB 2.25 g     2.25 g 100 mL/hr over 30 Minutes Intravenous Every 8 hours 08/11/18 1437      08/11/18 1000  piperacillin-tazobactam (ZOSYN) IVPB 2.25 g  Status:  Discontinued     2.25 g 100 mL/hr over 30 Minutes Intravenous Every 8 hours 08/11/18 0237 08/11/18 1437   08/11/18 0145  piperacillin-tazobactam (ZOSYN) IVPB 3.375 g     3.375 g 100 mL/hr over 30 Minutes Intravenous  Once 08/11/18 0134 08/11/18 0239        Objective:   Vitals:   08/11/18 1257 08/11/18 2113 08/12/18 0535 08/12/18 1347  BP: (!) 147/71 (!) 123/56 115/71 111/81  Pulse: 86 (!) 54 86 92  Resp: 18 20 16 16   Temp: 97.6 F (36.4 C) 98 F (36.7 C) 97.6 F (36.4 C)   TempSrc:      SpO2: 95% 96% 97% 95%  Weight:      Height:        Wt Readings from Last 3 Encounters:  08/13/2018 84.8 kg  06/24/18 85.2 kg  12/20/17 84.1 kg    Intake/Output Summary (Last 24 hours) at 08/12/2018 2030 Last data filed at 08/12/2018 1400 Gross per 24 hour  Intake 1346.89 ml  Output 350 ml  Net 996.89 ml    Physical Exam Patient is examined daily including today on 08/13/18 , exams remain the same as of yesterday except that has changed   Gen:- Awake Alert, frail-appearing HEENT:- [place].AT, No sclera icterus Neck-Supple Neck,No JVD,.  Lungs-  CTAB , fair symmetrical air movement CV- S1, S2 normal, regular  Abd-  +ve B.Sounds, Abd Soft, No significant tenderness,    Extremity/Skin:- No  edema, pedal pulses present  Psych-affect is appropriate, oriented x3 Neuro-no new focal deficits, no tremors   Data Review:   Micro Results Recent Results (from the past 240 hour(s))  MRSA PCR Screening     Status: Abnormal   Collection Time: 08/11/18  9:00 AM  Result Value Ref Range Status   MRSA by PCR POSITIVE (A) NEGATIVE Final    Comment:        The GeneXpert MRSA Assay (FDA approved for NASAL specimens only), is one component of a comprehensive MRSA colonization surveillance program. It is not intended to diagnose MRSA infection nor to guide or monitor treatment for MRSA infections. RESULT CALLED TO, READ BACK BY AND  VERIFIED WITH: BLOCK,D. RN AT 1108 08/11/18 MULLINS,T Performed at [facility], [place] [address] [place], [place] [address]   Culture, Urine     Status: Abnormal   Collection Time: 08/11/18  3:14 PM  Result Value Ref Range Status   Specimen Description   Final    URINE, CATHETERIZED Performed at [facility] [address] [place], [place] [address]    Special Requests   Final    NONE Performed at [facility], [place] [address] [place], [place] [address]    Culture (A)  Final    <  10,000 COLONIES/mL INSIGNIFICANT GROWTH Performed at [facility], [place] [address] [place], [place] [address]    Report Status 08/12/2018 FINAL  Final    Radiology Reports Ct Abdomen Pelvis Wo Contrast  Result Date: 08/11/2018 CLINICAL DATA:  Abdominal pain, nausea, and vomiting. EXAM: CT ABDOMEN AND PELVIS WITHOUT CONTRAST TECHNIQUE: Multidetector CT imaging of the abdomen and pelvis was performed following the standard protocol without IV contrast. COMPARISON:  None. FINDINGS: Lower chest: Atelectasis in the lung bases. Cardiac enlargement. Coronary artery calcifications. Hepatobiliary: No focal liver lesions. Gallbladder is distended with stone in the region of the gallbladder neck. Increased density of the bile suggesting sludge. No inflammatory infiltration or wall thickening. No bile duct dilatation. Pancreas: Unremarkable. No pancreatic ductal dilatation or surrounding inflammatory changes. Spleen: Normal in size without focal abnormality. Adrenals/Urinary Tract: No adrenal gland nodules. Punctate stone in the midpole right kidney. No hydronephrosis or hydroureter. Bladder is somewhat decompressed. There is diffuse bladder wall thickening with multiple bladder diverticula. No stones. Stomach/Bowel: Stomach is within normal limits. Appendix appears normal. No evidence of bowel wall thickening, distention, or inflammatory changes. Colonic  diverticula without evidence of diverticulitis. Vascular/Lymphatic: Aortic atherosclerosis. No enlarged abdominal or pelvic lymph nodes. Reproductive: Prostate is unremarkable. Other: No abdominal wall hernia or abnormality. No abdominopelvic ascites. Musculoskeletal: Degenerative changes in the spine. IMPRESSION: 1. Distended gallbladder with stone in the region of the gallbladder neck. Increased density of the bile suggesting sludge. No inflammatory changes. 2. Diffuse bladder wall thickening with multiple bladder diverticula consistent with chronic bladder outlet obstruction. Aortic Atherosclerosis (ICD10-I70.0). Electronically Signed   By: Lucienne Capers M.D.   On: 08/11/2018 01:22   Nm Hepatobiliary Liver Func  Result Date: 08/12/2018 CLINICAL DATA:  Upper abdominal pain EXAM: NUCLEAR MEDICINE HEPATOBILIARY IMAGING TECHNIQUE: Sequential images of the abdomen were obtained out to 60 minutes following intravenous administration of radiopharmaceutical. RADIOPHARMACEUTICALS:  5.3 mCi Tc-80m  Choletec IV COMPARISON:  Ultrasound right upper quadrant August 11, 2018 FINDINGS: Prompt uptake and biliary excretion of activity by the liver is seen. Gallbladder activity is visualized, consistent with patency of cystic duct. Biliary activity passes into small bowel, consistent with patent common bile duct. Note that there is slight delay in visualization of the gallbladder compared to small bowel. IMPRESSION: Patency of the cystic and common bile ducts, evidenced by visualization of gallbladder and small bowel. Gallbladder visualization is slightly delayed compared to visualization of small bowel. Note that a degree of underlying chronic cholecystitis can present in this manner. Electronically Signed   By: Lowella Grip III M.D.   On: 08/12/2018 10:02   US Abdomen Limited Ruq  Result Date: 08/11/2018 CLINICAL DATA:  Epigastric pain EXAM: ULTRASOUND ABDOMEN LIMITED RIGHT UPPER QUADRANT COMPARISON:  CT  abdomen/pelvis dated 08/11/2018 at 0046 hours FINDINGS: Gallbladder: Poorly visualized/evaluated due to patient discomfort. Positive sonographic Murphy's sign. Common bile duct: Not discretely visualized. Liver: At the upper limits of normal for parenchymal echogenicity. No focal hepatic lesion is seen. Portal vein is patent on color Doppler imaging with normal direction of blood flow towards the liver. IMPRESSION: Gallbladder is poorly visualized on ultrasound. Positive sonographic Murphy's sign. However, there were no pericholecystic inflammatory changes on CT to suggest acute cholecystitis. If there is continued clinical concern, consider hepatobiliary nuclear medicine scan. Electronically Signed   By: Julian Hy M.D.   On: 08/11/2018 02:25     CBC Recent Labs  Lab 08/01/2018 2345 08/11/18 0500 08/12/18 1142  WBC 6.8 7.6 15.1*  HGB 9.8*  10.1* 11.2*  HCT 32.9* 34.0* 37.6*  PLT 179 205 182  MCV 94.3 92.6 94.5  MCH 28.1 27.5 28.1  MCHC 29.8* 29.7* 29.8*  RDW 12.9 12.8 13.2  LYMPHSABS  --   --  0.6*  MONOABS  --   --  1.0  EOSABS  --   --  0.0  BASOSABS  --   --  0.0    Chemistries  Recent Labs  Lab 08/18/2018 2345 08/11/18 0500 08/12/18 1142  NA 138 141 144  K 5.1 4.9 5.0  CL 108 107 112*  CO2 23 23 22   GLUCOSE 137* 147* 116*  BUN 68* 68* 74*  CREATININE 3.88* 3.61* 3.87*  CALCIUM 8.5* 8.5* 8.6*  MG  --   --  2.4  AST 17 23 176*  ALT 16 18 320*  ALKPHOS 51 51 86  BILITOT 0.6 0.8 1.5*   ------------------------------------------------------------------------------------------------------------------ No results for input(s): CHOL, HDL, LDLCALC, TRIG, CHOLHDL, LDLDIRECT in the last 72 hours.  Lab Results  Component Value Date   HGBA1C 5.6 05/12/2015   ------------------------------------------------------------------------------------------------------------------ No results for input(s): TSH, T4TOTAL, T3FREE, THYROIDAB in the last 72 hours.  Invalid input(s):  FREET3 ------------------------------------------------------------------------------------------------------------------ No results for input(s): VITAMINB12, FOLATE, FERRITIN, TIBC, IRON, RETICCTPCT in the last 72 hours.  Coagulation profile Recent Labs  Lab 08/11/18 1359  INR 1.16    No results for input(s): DDIMER in the last 72 hours.  Cardiac Enzymes No results for input(s): CKMB, TROPONINI, MYOGLOBIN in the last 168 hours.  Invalid input(s): CK ------------------------------------------------------------------------------------------------------------------    Component Value Date/Time   BNP 141.5 (H) 09/22/2017 2121     Roxan Hockey M.D on 08/12/2018 at 8:30 PM  Go to [email or web address] - for contact info  [facility] - Office  [phone]

## 2018-08-12 NOTE — Progress Notes (Signed)
Came by to see patient but he is currently out of the room for HIDA scan. Our service will come back by later today assess.  Sharon Mt. Dema Severin, M.D. [facility]

## 2018-08-12 NOTE — Progress Notes (Addendum)
CC:  Abdominal pain  Subjective: Pt back from HIDA.  He says he does not hurt, but it is very hard to tell.  He still seems to have some generalized tenderness on palpation, no one spot.  He is wheezing and just feels bad, he can't localize one area.  He thinks he feels better with foley in.  He appears more comfortable than yesterday when we saw him  Objective: Vital signs in last 24 hours: Temp:  [97.6 F (36.4 C)-98 F (36.7 C)] 97.6 F (36.4 C) (01/14 0535) Pulse Rate:  [54-86] 86 (01/14 0535) Resp:  [16-20] 16 (01/14 0535) BP: (115-147)/(56-71) 115/71 (01/14 0535) SpO2:  [95 %-97 %] 97 % (01/14 0535) Last BM Date: 08/12/18 NPO 2538 IV 1200 + urine BM x 1 Afebrile, BP muich better with Foley in  Labs not done so far -coming back to draw now he is back from Xray HIDA in just completed Intake/Output from previous day: 01/13 0701 - 01/14 0700 In: 2538.5 [I.V.:2438.5; IV Piggyback:100] Out: 1200 [Urine:1200] Intake/Output this shift: No intake/output data recorded.  General appearance: alert, cooperative and still fairly uncomfortable, but no real specific abdominal tenderness just generalized discomfort. Resp: wheezing bilaterally GI: soft, generalized discomfort, but no specific site of pain.  + BS, + BM  Lab Results:  Recent Labs    08/20/2018 2345 08/11/18 0500  WBC 6.8 7.6  HGB 9.8* 10.1*  HCT 32.9* 34.0*  PLT 179 205    BMET Recent Labs    08/17/2018 2345 08/11/18 0500  NA 138 141  K 5.1 4.9  CL 108 107  CO2 23 23  GLUCOSE 137* 147*  BUN 68* 68*  CREATININE 3.88* 3.61*  CALCIUM 8.5* 8.5*   PT/INR Recent Labs    08/11/18 1359  LABPROT 14.7  INR 1.16    Recent Labs  Lab 08/02/2018 2345 08/11/18 0500  AST 17 23  ALT 16 18  ALKPHOS 51 51  BILITOT 0.6 0.8  PROT 6.9 6.9  ALBUMIN 3.5 3.7     Lipase     Component Value Date/Time   LIPASE 51 08/03/2018 2345     Medications: . aspirin EC  81 mg Oral Daily  . carvedilol  6.25 mg Oral  BID WC  . Chlorhexidine Gluconate Cloth  6 each Topical Q0600  . cholecalciferol  1,000 Units Oral BID  . dorzolamide-timolol  1 drop Both Eyes BID  . latanoprost  1 drop Both Eyes QHS  . mupirocin ointment  1 application Nasal BID  . pantoprazole  40 mg Oral BID  . tamsulosin  0.4 mg Oral QPC supper    Assessment/Plan  CAD s/p NSTEMI 09/2017 Combined CHF - EF 25-30% on ECHO 08/2017, cardiologist Dr. Tamala Julian S/p pacemaker - followed by Dr. Lovena Le AKI on CKD Urinary retention with bladder outlet obstruction - 68F Coude placed Dr. Claudette Stapler HTN  RUQ pain, nausea, vomiting Cholelithiasis - Patient with RUQ pain and gallstones, although no clear signs of acute cholecystitis on imaging. LFTs WNL. CT:  Stone in neck of GB/sludge in bile/US poor visualization due to pt abdominal pain  - HIDA results pending  - AM labs pending  - High risk per Cardiology  ID -zosyn 1/13>> VTE -SCDs FEN -IVF, NPO Foley -none   Plan:  Await HIDA and labs.  He appears more comfortable than he did yesterday, but still feels bad, tired wheezing.Cardiology says he is high risk for surgery.         LOS:  0 days    Adea Geisel 08/12/2018 [phone]

## 2018-08-12 NOTE — Progress Notes (Signed)
Feeling better after urinary catheter placed. HIDA scan reviewed. Tolerating PO. No plans for surgery. Please reconsult if needed.  [facility] will sign off.   Medication Recommendations:  No changes in meds Other recommendations (labs, testing, etc):  n/a Follow up as an outpatient:  Will arrange follow up with Dr. Tamala Julian.

## 2018-08-13 DIAGNOSIS — J181 Lobar pneumonia, unspecified organism: Secondary | ICD-10-CM | POA: Diagnosis not present

## 2018-08-13 DIAGNOSIS — N3000 Acute cystitis without hematuria: Secondary | ICD-10-CM | POA: Diagnosis present

## 2018-08-13 DIAGNOSIS — I130 Hypertensive heart and chronic kidney disease with heart failure and stage 1 through stage 4 chronic kidney disease, or unspecified chronic kidney disease: Secondary | ICD-10-CM | POA: Diagnosis present

## 2018-08-13 DIAGNOSIS — N184 Chronic kidney disease, stage 4 (severe): Secondary | ICD-10-CM | POA: Diagnosis present

## 2018-08-13 DIAGNOSIS — E872 Acidosis: Secondary | ICD-10-CM | POA: Diagnosis present

## 2018-08-13 DIAGNOSIS — K8020 Calculus of gallbladder without cholecystitis without obstruction: Secondary | ICD-10-CM | POA: Diagnosis present

## 2018-08-13 DIAGNOSIS — Z8249 Family history of ischemic heart disease and other diseases of the circulatory system: Secondary | ICD-10-CM | POA: Diagnosis not present

## 2018-08-13 DIAGNOSIS — Z66 Do not resuscitate: Secondary | ICD-10-CM | POA: Diagnosis present

## 2018-08-13 DIAGNOSIS — F0390 Unspecified dementia without behavioral disturbance: Secondary | ICD-10-CM | POA: Diagnosis not present

## 2018-08-13 DIAGNOSIS — K8000 Calculus of gallbladder with acute cholecystitis without obstruction: Secondary | ICD-10-CM | POA: Diagnosis not present

## 2018-08-13 DIAGNOSIS — Z950 Presence of cardiac pacemaker: Secondary | ICD-10-CM | POA: Diagnosis not present

## 2018-08-13 DIAGNOSIS — R338 Other retention of urine: Secondary | ICD-10-CM | POA: Diagnosis present

## 2018-08-13 DIAGNOSIS — E860 Dehydration: Secondary | ICD-10-CM | POA: Diagnosis present

## 2018-08-13 DIAGNOSIS — I959 Hypotension, unspecified: Secondary | ICD-10-CM | POA: Diagnosis not present

## 2018-08-13 DIAGNOSIS — I252 Old myocardial infarction: Secondary | ICD-10-CM | POA: Diagnosis not present

## 2018-08-13 DIAGNOSIS — K56609 Unspecified intestinal obstruction, unspecified as to partial versus complete obstruction: Secondary | ICD-10-CM | POA: Diagnosis not present

## 2018-08-13 DIAGNOSIS — K920 Hematemesis: Secondary | ICD-10-CM | POA: Diagnosis present

## 2018-08-13 DIAGNOSIS — N320 Bladder-neck obstruction: Secondary | ICD-10-CM | POA: Diagnosis present

## 2018-08-13 DIAGNOSIS — I5042 Chronic combined systolic (congestive) and diastolic (congestive) heart failure: Secondary | ICD-10-CM | POA: Diagnosis present

## 2018-08-13 DIAGNOSIS — Z96652 Presence of left artificial knee joint: Secondary | ICD-10-CM | POA: Diagnosis present

## 2018-08-13 DIAGNOSIS — Z8711 Personal history of peptic ulcer disease: Secondary | ICD-10-CM | POA: Diagnosis not present

## 2018-08-13 DIAGNOSIS — R079 Chest pain, unspecified: Secondary | ICD-10-CM | POA: Diagnosis not present

## 2018-08-13 DIAGNOSIS — I2510 Atherosclerotic heart disease of native coronary artery without angina pectoris: Secondary | ICD-10-CM | POA: Diagnosis present

## 2018-08-13 DIAGNOSIS — J690 Pneumonitis due to inhalation of food and vomit: Secondary | ICD-10-CM | POA: Diagnosis not present

## 2018-08-13 DIAGNOSIS — N179 Acute kidney failure, unspecified: Secondary | ICD-10-CM | POA: Diagnosis present

## 2018-08-13 DIAGNOSIS — I495 Sick sinus syndrome: Secondary | ICD-10-CM | POA: Diagnosis present

## 2018-08-13 DIAGNOSIS — I255 Ischemic cardiomyopathy: Secondary | ICD-10-CM | POA: Diagnosis present

## 2018-08-13 LAB — CBC
HCT: 32 % — ABNORMAL LOW (ref 39.0–52.0)
HEMOGLOBIN: 9.4 g/dL — AB (ref 13.0–17.0)
MCH: 27.2 pg (ref 26.0–34.0)
MCHC: 29.4 g/dL — ABNORMAL LOW (ref 30.0–36.0)
MCV: 92.5 fL (ref 80.0–100.0)
Platelets: 162 10*3/uL (ref 150–400)
RBC: 3.46 MIL/uL — ABNORMAL LOW (ref 4.22–5.81)
RDW: 13.2 % (ref 11.5–15.5)
WBC: 10.6 10*3/uL — ABNORMAL HIGH (ref 4.0–10.5)
nRBC: 0 % (ref 0.0–0.2)

## 2018-08-13 LAB — COMPREHENSIVE METABOLIC PANEL
ALT: 192 U/L — ABNORMAL HIGH (ref 0–44)
AST: 72 U/L — AB (ref 15–41)
Albumin: 2.6 g/dL — ABNORMAL LOW (ref 3.5–5.0)
Alkaline Phosphatase: 68 U/L (ref 38–126)
Anion gap: 14 (ref 5–15)
BUN: 82 mg/dL — ABNORMAL HIGH (ref 8–23)
CO2: 18 mmol/L — AB (ref 22–32)
Calcium: 8.3 mg/dL — ABNORMAL LOW (ref 8.9–10.3)
Chloride: 109 mmol/L (ref 98–111)
Creatinine, Ser: 4.33 mg/dL — ABNORMAL HIGH (ref 0.61–1.24)
GFR calc Af Amer: 13 mL/min — ABNORMAL LOW (ref >60)
GFR calc non Af Amer: 11 mL/min — ABNORMAL LOW (ref >60)
Glucose, Bld: 122 mg/dL — ABNORMAL HIGH (ref 70–99)
Potassium: 4.6 mmol/L (ref 3.5–5.1)
SODIUM: 141 mmol/L (ref 135–145)
Total Bilirubin: 0.9 mg/dL (ref 0.3–1.2)
Total Protein: 5.8 g/dL — ABNORMAL LOW (ref 6.5–8.1)

## 2018-08-13 MED ORDER — OXYCODONE HCL 5 MG PO TABS: 5.0000 mg | ORAL_TABLET | Freq: Four times a day (QID) | ORAL | Status: DC | PRN

## 2018-08-13 MED ORDER — LACTATED RINGERS IV SOLN
INTRAVENOUS | Status: DC
  Administered 2018-08-13 – 2018-08-14 (×2): via INTRAVENOUS

## 2018-08-13 NOTE — Progress Notes (Signed)
PROGRESS NOTE    Bradley [name]  UKG:[id number] DOB: 05-Mar-1928 DOA: 08/15/2018 PCP: [name], MD   Brief Narrative:  Bradley Romig Arledgeis an 45yomale PMH CAD s/p NSTEMI 09/2017, combined CHF (EF 25-30% on ECHO 08/2017), HTN, s/p pacemaker, CKD admitted on 08/28/2018 with abdominal pain, nausea vomiting or concerns about possible acute cholecystitis however work-up was negative for acute cholecystitis advance diet as tolerated as of 08/12/2018  Assessment & Plan:   Principal Problem:   Acute calculous cholecystitis Active Problems:   Chronic kidney disease, stage IV (severe) (HCC)   Chronic combined systolic (congestive) and diastolic (congestive) heart failure (HCC)   1) abdominal pain---  CT abdomen and pelvis suggested gallbladder distention with stone in region of gallbladder neck and increased density of bile suggesting sludge RUQ [place] inconclusive (no pericholecystic inflammatory changes suggestive of cholecystitis, but positive murphy's) HIDA scan with patent cystic and CBD with delay in visualization in gallbladder compared to small bowel ("a degree of chronic cholecystitis can present in this matter") Per surgery, HIDA negative for cholecystitis, no need for perc drain or surgical intervention - recommending ADAT. Abx discontinued LFT's elevated 1/14 (up from admission) to 176/320 peaked, improving to 72/192 today (AST/ALT).  Bili mildly elevated on 1/14, improved today.  Continue to monitor. Of note, apparently pts symptoms improved after foley placed (evidence of chronic bladder outlet obstruction on imaging) - will need to discuss with urology  3)AKI----acute kidney injury on CKD stage  IV- suspected to be due to nausea vomiting, poor oral intake/dehydration compounded by Lasix use prior to admission Baseline creatinine appears to be around 2.6 Creatinine rising today, to 4.33  UA with 6-10 RBC, 30 mg/dl protein CT with evidence of chronic bladder outlet obstruction,  but no hydro.  Foley now in place.  Follow I/O, daily weights (so far 400 cc charted for today) Gentle IVF Hold lasix  2)HFrEF--- EF 25-30% 09/23/2017 Appears euvolemic at this time Coreg 6.25 daily Lasix currently on hold Receiving gentle IVF with aki above Avoiding ACE/ARB/ARNI given AKI on CKD   Elevated LFT's: Improving today RUQ [place] as noted above Follow hepatitis panel Continue to trend  NAGMA: likely related to AKI, continue to follow with IVF  Leukocytosis: afebrile, urine cx without sig growth.  Continue to monitor.  DVT prophylaxis: heparin  Code Status: full code Family Communication: none at bedside (called wife, no answer, left message) Disposition Plan: inpatient given worsening renal function, needs PT eval   Consultants:   surgery  Procedures:   none  Antimicrobials:  Anti-infectives (From admission, onward)   Start     Dose/Rate Route Frequency Ordered Stop   08/11/18 2100  piperacillin-tazobactam (ZOSYN) IVPB 2.25 g     2.25 g 100 mL/hr over 30 Minutes Intravenous Every 8 hours 08/11/18 1437 08/12/18 2119   08/11/18 1000  piperacillin-tazobactam (ZOSYN) IVPB 2.25 g  Status:  Discontinued     2.25 g 100 mL/hr over 30 Minutes Intravenous Every 8 hours 08/11/18 0237 08/11/18 1437   08/11/18 0145  piperacillin-tazobactam (ZOSYN) IVPB 3.375 g     3.375 g 100 mL/hr over 30 Minutes Intravenous  Once 08/11/18 0134 08/11/18 0239         Subjective: Doing ok today Some abdominal discomfort  Objective: Vitals:   08/12/18 2227 08/12/18 2300 08/13/18 0521 08/13/18 1349  BP: (!) 99/46 (!) 107/58 (!) 90/56 109/73  Pulse: 77 86 78 82  Resp: (!) 24 20 20 17   Temp: 98.1 F (36.7 C) 98 F (  36.7 C) 98 F (36.7 C) 97.6 F (36.4 C)  TempSrc:    Oral  SpO2: 95% 96% 98% 100%  Weight:      Height:        Intake/Output Summary (Last 24 hours) at 08/13/2018 1819 Last data filed at 08/13/2018 1606 Gross per 24 hour  Intake 922 ml  Output 375 ml  Net  547 ml   Filed Weights   07/30/2018 2252  Weight: 84.8 kg    Examination:  General exam: Appears calm and comfortable  Respiratory system: Clear to auscultation. Respiratory effort normal. Cardiovascular system: S1 & S2 heard, RRR. Gastrointestinal system: Abdomen is nondistended, soft and nontender.  Central nervous system: Alert and oriented. No focal neurological deficits. Extremities: no LEE Skin: No rashes, lesions or ulcers Psychiatry: Judgement and insight appear normal. Mood & affect appropriate.     Data Reviewed: I have personally reviewed following labs and imaging studies  CBC: Recent Labs  Lab 08/29/2018 2345 08/11/18 0500 08/12/18 1142 08/13/18 0538  WBC 6.8 7.6 15.1* 10.6*  NEUTROABS  --   --  13.4*  --   HGB 9.8* 10.1* 11.2* 9.4*  HCT 32.9* 34.0* 37.6* 32.0*  MCV 94.3 92.6 94.5 92.5  PLT 179 205 182 601   Basic Metabolic Panel: Recent Labs  Lab 08/11/2018 2345 08/11/18 0500 08/12/18 1142 08/13/18 0538  NA 138 141 144 141  K 5.1 4.9 5.0 4.6  CL 108 107 112* 109  CO2 23 23 22  18*  GLUCOSE 137* 147* 116* 122*  BUN 68* 68* 74* 82*  CREATININE 3.88* 3.61* 3.87* 4.33*  CALCIUM 8.5* 8.5* 8.6* 8.3*  MG  --   --  2.4  --   PHOS  --   --  5.1*  --    GFR: Estimated Creatinine Clearance: 12.9 mL/min (A) (by C-G formula based on SCr of 4.33 mg/dL (H)). Liver Function Tests: Recent Labs  Lab 08/05/2018 2345 08/11/18 0500 08/12/18 1142 08/13/18 0538  AST 17 23 176* 72*  ALT 16 18 320* 192*  ALKPHOS 51 51 86 68  BILITOT 0.6 0.8 1.5* 0.9  PROT 6.9 6.9 6.7 5.8*  ALBUMIN 3.5 3.7 3.1* 2.6*   Recent Labs  Lab 08/21/2018 2345  LIPASE 51   No results for input(s): AMMONIA in the last 168 hours. Coagulation Profile: Recent Labs  Lab 08/11/18 1359  INR 1.16   Cardiac Enzymes: No results for input(s): CKTOTAL, CKMB, CKMBINDEX, TROPONINI in the last 168 hours. BNP (last 3 results) No results for input(s): PROBNP in the last 8760 hours. HbA1C: No  results for input(s): HGBA1C in the last 72 hours. CBG: No results for input(s): GLUCAP in the last 168 hours. Lipid Profile: No results for input(s): CHOL, HDL, LDLCALC, TRIG, CHOLHDL, LDLDIRECT in the last 72 hours. Thyroid Function Tests: No results for input(s): TSH, T4TOTAL, FREET4, T3FREE, THYROIDAB in the last 72 hours. Anemia Panel: No results for input(s): VITAMINB12, FOLATE, FERRITIN, TIBC, IRON, RETICCTPCT in the last 72 hours. Sepsis Labs: Recent Labs  Lab 08/11/18 1136 08/11/18 1359  LATICACIDVEN 3.4* 3.0*    Recent Results (from the past 240 hour(s))  MRSA PCR Screening     Status: Abnormal   Collection Time: 08/11/18  9:00 AM  Result Value Ref Range Status   MRSA by PCR POSITIVE (A) NEGATIVE Final    Comment:        The GeneXpert MRSA Assay (FDA approved for NASAL specimens only), is one component of a comprehensive MRSA colonization  surveillance program. It is not intended to diagnose MRSA infection nor to guide or monitor treatment for MRSA infections. RESULT CALLED TO, READ BACK BY AND VERIFIED WITH: BLOCK,D. RN AT 1108 08/11/18 MULLINS,T Performed at [facility], [place] [address] [place], [place] [address]   Culture, Urine     Status: Abnormal   Collection Time: 08/11/18  3:14 PM  Result Value Ref Range Status   Specimen Description   Final    URINE, CATHETERIZED Performed at [facility] [address] [place], [place] [address]    Special Requests   Final    NONE Performed at [facility], [place] [address] [place], [place] [address]    Culture (A)  Final    <10,000 COLONIES/mL INSIGNIFICANT GROWTH Performed at [place] [address], [place],  [address]    Report Status 08/12/2018 FINAL  Final         Radiology Studies: Nm Hepatobiliary Liver Func  Result Date: 08/12/2018 CLINICAL DATA:  Upper abdominal pain EXAM: NUCLEAR MEDICINE HEPATOBILIARY IMAGING  TECHNIQUE: Sequential images of the abdomen were obtained out to 60 minutes following intravenous administration of radiopharmaceutical. RADIOPHARMACEUTICALS:  5.3 mCi Tc-41m  Choletec IV COMPARISON:  Ultrasound right upper quadrant August 11, 2018 FINDINGS: Prompt uptake and biliary excretion of activity by the liver is seen. Gallbladder activity is visualized, consistent with patency of cystic duct. Biliary activity passes into small bowel, consistent with patent common bile duct. Note that there is slight delay in visualization of the gallbladder compared to small bowel. IMPRESSION: Patency of the cystic and common bile ducts, evidenced by visualization of gallbladder and small bowel. Gallbladder visualization is slightly delayed compared to visualization of small bowel. Note that a degree of underlying chronic cholecystitis can present in this manner. Electronically Signed   By: Lowella Grip III M.D.   On: 08/12/2018 10:02        Scheduled Meds: . aspirin EC  81 mg Oral Daily  . carvedilol  6.25 mg Oral BID WC  . Chlorhexidine Gluconate Cloth  6 each Topical Q0600  . cholecalciferol  1,000 Units Oral BID  . dorzolamide-timolol  1 drop Both Eyes BID  . heparin injection (subcutaneous)  5,000 Units Subcutaneous Q8H  . latanoprost  1 drop Both Eyes QHS  . mupirocin ointment  1 application Nasal BID  . pantoprazole  40 mg Oral BID  . tamsulosin  0.4 mg Oral QPC supper   Continuous Infusions: . lactated ringers 100 mL/hr at 08/13/18 1606  . methocarbamol (ROBAXIN) IV       LOS: 0 days    Time spent: over 30 min    Fayrene Helper, MD [facility] Pager Please refer to [facility] for pager number  If 7PM-7AM, please contact night-coverage [email or web address] Password [email or web address] 08/13/2018, 6:19 PM

## 2018-08-13 NOTE — Care Management Obs Status (Signed)
[place] NOTIFICATION   Patient Details  Name: AABAN GRIEP MRN: [id number] Date of Birth: 05/15/1928   Medicare Observation Status Notification Given:  Yes    Leeroy Cha, RN 08/13/2018, 9:26 AM

## 2018-08-13 NOTE — Progress Notes (Signed)
Remote pacemaker transmission.   

## 2018-08-13 NOTE — Care Management CC44 (Signed)
Condition Code 44 Documentation Completed  Patient Details  Name: Bradley [name] MRN: [id number] Date of Birth: 1927/08/08   Condition Code 44 given:  Yes Patient signature on Condition Code 44 notice:  Yes Documentation of 2 MD's agreement:  Yes Code 44 added to claim:  Yes    Leeroy Cha, RN 08/13/2018, 1:19 PM

## 2018-08-14 ENCOUNTER — Inpatient Hospital Stay (HOSPITAL_COMMUNITY): Payer: Medicare Other | Admitting: Anesthesiology

## 2018-08-14 ENCOUNTER — Inpatient Hospital Stay (HOSPITAL_COMMUNITY): Payer: Medicare Other

## 2018-08-14 ENCOUNTER — Encounter (HOSPITAL_COMMUNITY): Payer: Self-pay | Admitting: Certified Registered Nurse Anesthetist

## 2018-08-14 ENCOUNTER — Encounter (HOSPITAL_COMMUNITY): Admission: RE | Disposition: E | Payer: Self-pay | Source: Ambulatory Visit | Attending: Family Medicine

## 2018-08-14 DIAGNOSIS — J181 Lobar pneumonia, unspecified organism: Secondary | ICD-10-CM

## 2018-08-14 DIAGNOSIS — R079 Chest pain, unspecified: Secondary | ICD-10-CM

## 2018-08-14 DIAGNOSIS — J189 Pneumonia, unspecified organism: Secondary | ICD-10-CM

## 2018-08-14 LAB — COMPREHENSIVE METABOLIC PANEL
ALT: 135 U/L — ABNORMAL HIGH (ref 0–44)
AST: 49 U/L — ABNORMAL HIGH (ref 15–41)
Albumin: 2.7 g/dL — ABNORMAL LOW (ref 3.5–5.0)
Alkaline Phosphatase: 78 U/L (ref 38–126)
Anion gap: 10 (ref 5–15)
BUN: 94 mg/dL — ABNORMAL HIGH (ref 8–23)
CO2: 21 mmol/L — ABNORMAL LOW (ref 22–32)
Calcium: 8.4 mg/dL — ABNORMAL LOW (ref 8.9–10.3)
Chloride: 107 mmol/L (ref 98–111)
Creatinine, Ser: 4.41 mg/dL — ABNORMAL HIGH (ref 0.61–1.24)
GFR calc non Af Amer: 11 mL/min — ABNORMAL LOW (ref >60)
GFR, EST AFRICAN AMERICAN: 13 mL/min — AB (ref >60)
Glucose, Bld: 166 mg/dL — ABNORMAL HIGH (ref 70–99)
Potassium: 4.6 mmol/L (ref 3.5–5.1)
Sodium: 138 mmol/L (ref 135–145)
Total Bilirubin: 1.1 mg/dL (ref 0.3–1.2)
Total Protein: 5.9 g/dL — ABNORMAL LOW (ref 6.5–8.1)

## 2018-08-14 LAB — LACTIC ACID, PLASMA
Lactic Acid, Venous: 2 mmol/L (ref 0.5–1.9)
Lactic Acid, Venous: 2.3 mmol/L (ref 0.5–1.9)

## 2018-08-14 LAB — CBC
HCT: 33.1 % — ABNORMAL LOW (ref 39.0–52.0)
Hemoglobin: 9.8 g/dL — ABNORMAL LOW (ref 13.0–17.0)
MCH: 27.1 pg (ref 26.0–34.0)
MCHC: 29.6 g/dL — ABNORMAL LOW (ref 30.0–36.0)
MCV: 91.7 fL (ref 80.0–100.0)
PLATELETS: 200 10*3/uL (ref 150–400)
RBC: 3.61 MIL/uL — ABNORMAL LOW (ref 4.22–5.81)
RDW: 13.1 % (ref 11.5–15.5)
WBC: 10.8 10*3/uL — ABNORMAL HIGH (ref 4.0–10.5)
nRBC: 0 % (ref 0.0–0.2)

## 2018-08-14 LAB — HEMOGLOBIN AND HEMATOCRIT, BLOOD
HCT: 32.7 % — ABNORMAL LOW (ref 39.0–52.0)
HCT: 27.6 % — ABNORMAL LOW (ref 39.0–52.0)
Hemoglobin: 9.3 g/dL — ABNORMAL LOW (ref 13.0–17.0)
Hemoglobin: 8.1 g/dL — ABNORMAL LOW (ref 13.0–17.0)

## 2018-08-14 LAB — TROPONIN I
Troponin I: 0.38 ng/mL (ref <0.03)
Troponin I: 0.4 ng/mL (ref <0.03)
Troponin I: 0.39 ng/mL (ref <0.03)

## 2018-08-14 SURGERY — CANCELLED PROCEDURE

## 2018-08-14 MED ORDER — NITROGLYCERIN 0.4 MG SL SUBL
SUBLINGUAL_TABLET | SUBLINGUAL | Status: AC
  Filled 2018-08-14: qty 1

## 2018-08-14 MED ORDER — MORPHINE SULFATE (PF) 2 MG/ML IV SOLN: 2.0000 mg | Freq: Once | INTRAVENOUS | Status: DC

## 2018-08-14 MED ORDER — SODIUM CHLORIDE 0.9 % IV BOLUS
500.0000 mL | Freq: Once | INTRAVENOUS | Status: AC
  Administered 2018-08-14: 500 mL via INTRAVENOUS

## 2018-08-14 MED ORDER — NITROGLYCERIN 0.4 MG SL SUBL: 0.4000 mg | SUBLINGUAL_TABLET | SUBLINGUAL | Status: DC | PRN

## 2018-08-14 MED ORDER — LACTATED RINGERS IV BOLUS
250.0000 mL | Freq: Once | INTRAVENOUS | Status: AC
  Administered 2018-08-14: 250 mL via INTRAVENOUS

## 2018-08-14 MED ORDER — LACTATED RINGERS IV BOLUS: 500.0000 mL | Freq: Once | INTRAVENOUS | Status: DC

## 2018-08-14 MED ORDER — SODIUM CHLORIDE 0.9 % IV SOLN
1.0000 g | Freq: Every day | INTRAVENOUS | Status: DC
  Administered 2018-08-14: 1 g via INTRAVENOUS
  Filled 2018-08-14: qty 1

## 2018-08-14 MED ORDER — PANTOPRAZOLE SODIUM 40 MG IV SOLR
40.0000 mg | Freq: Two times a day (BID) | INTRAVENOUS | Status: DC
  Administered 2018-08-14: 40 mg via INTRAVENOUS
  Filled 2018-08-14: qty 40

## 2018-08-14 MED ORDER — SODIUM CHLORIDE 0.9 % IV SOLN
3.0000 g | Freq: Two times a day (BID) | INTRAVENOUS | Status: DC
  Administered 2018-08-14: 3 g via INTRAVENOUS
  Filled 2018-08-14 (×2): qty 3

## 2018-08-14 MED ORDER — PROPOFOL 10 MG/ML IV BOLUS
INTRAVENOUS | Status: AC
  Filled 2018-08-14: qty 60

## 2018-08-14 MED ORDER — SODIUM CHLORIDE 0.9 % IV SOLN
500.0000 mg | Freq: Every day | INTRAVENOUS | Status: DC
  Administered 2018-08-14: 500 mg via INTRAVENOUS
  Filled 2018-08-14: qty 500

## 2018-08-14 MED ORDER — SODIUM CHLORIDE 0.9 % IV SOLN: INTRAVENOUS | Status: DC

## 2018-08-14 SURGICAL SUPPLY — 15 items

## 2018-08-15 DIAGNOSIS — K56609 Unspecified intestinal obstruction, unspecified as to partial versus complete obstruction: Secondary | ICD-10-CM

## 2018-08-15 DIAGNOSIS — J690 Pneumonitis due to inhalation of food and vomit: Secondary | ICD-10-CM

## 2018-08-15 DIAGNOSIS — K920 Hematemesis: Secondary | ICD-10-CM

## 2018-08-15 LAB — HEPATITIS PANEL, ACUTE
HCV AB: 0.2 {s_co_ratio} (ref 0.0–0.9)
Hep A IgM: NEGATIVE
Hep B C IgM: NEGATIVE
Hepatitis B Surface Ag: NEGATIVE

## 2018-08-15 LAB — CUP PACEART REMOTE DEVICE CHECK
Date Time Interrogation Session: 20200117202557
Implantable Lead Implant Date: 20161016
Implantable Lead Location: 753859
Implantable Lead Model: 377
Implantable Lead Serial Number: 49267833
Implantable Lead Serial Number: 49296158
Implantable Pulse Generator Implant Date: 20161016
MDC IDC LEAD IMPLANT DT: 20161016
MDC IDC LEAD LOCATION: 753860
Pulse Gen Model: 394969
Pulse Gen Serial Number: 68596848

## 2018-08-30 NOTE — H&P (Signed)
Rt called to do breathing tx. Pt has no order for nebs. MD at bedside doing and assessment.

## 2018-08-30 NOTE — Progress Notes (Signed)
Shift event note: Notified at approx 0130 re: Bradley [name] w/ increased WOB despite normal 02 sats on R/A. RR RN paged and responded to bedside. At that time Bradley [name] began to c/o SSCP 5/10. EKG obtained and troponin added x 1. Bradley [name] (RR RN) noted persistent increased WOB. PCXR obtained and SL NTG given x 1. After this Bradley [name] had episode of vomiting of what appeared to be thin, mucous-like, bilious, dark, ? blood tinged fluid. Following this episode of vomiting Bradley [name] noted to have audible upper airway secretions w/ a somewhat weak cough effort. 02 sats remained normal, Bradley [name] afebrile and remaining VSS. At the time of my assessment at bedside Bradley [name] noted lying in bedside w/ slightly increased WOB w/ audible upper airway secretions. Bradley [name] appears very weak, pale and though now denies CP continues to c/o difficulty breathing. BBS w/ scattered rhonchi. PCXR reveals RUL PNA. EKG w/o ischemic changes.  Assessment/Plan: 1. Dypsnea: No hypoxia. RUL PNA. Likely an aspiration component given recent h/o vomiting w/ cholecystitis. Will add IV Azithromycin and Rocephin. This will also cover for aspiration. PRN nebs and pulmonary toilet. NTS PRN for upper aitway secretions. 2. Chest pain: In Bradley [name] w/  PMH CAD s/p NSTEMI 09/2017, combined CHF (EF 25-30% on ECHO 08/2017), HTN, s/p pacemaker. EKG w/o significant changes. However, pain was relieved by SL NTG x 1 and 1st troponin returned at 0.38. Will continue to trend troponin's. Given Bradley [name]'s change in status and risk for further deterioration will transfer to SDU for closer observation w/ low threshold to consut CCM as indicated.   Jeryl Columbia, NP-C [facility] Pager [phone]  CRITICAL CARE Performed by: Jeryl Columbia   Total critical care time: 60 minutes  Critical care time was exclusive of separately billable procedures and treating other patients.  Critical care was necessary to treat or prevent imminent or life-threatening deterioration.  Critical care was time spent  personally by me on the following activities: development of treatment plan with patient and/or surrogate as well as nursing, discussions with consultants, evaluation of patient's response to treatment, examination of patient, obtaining history from patient or surrogate, ordering and performing treatments and interventions, ordering and review of laboratory studies, ordering and review of radiographic studies, pulse oximetry and re-evaluation of patient's condition.

## 2018-08-30 NOTE — Progress Notes (Signed)
CRITICAL VALUE ALERT  Critical Value: Lactic 2; Troponin 0.38  Date & Time Notied:  10-Sep-2018 0540am  Provider Notified: [facility] p[facility]  Orders Received/Actions taken: Waiting for orders; RN will continue to monitor

## 2018-08-30 NOTE — Consult Note (Signed)
Referring Provider:  Dr. Morey Hummingbird Primary Care Physician:  [name], MD Primary Gastroenterologist:  Dr. Wynetta Emery   Reason for Consultation: Coffee-ground emesis  HPI: Bradley [name] is a 85 y.o. male with past medical history of coronary disease status post and STEMI in March 2019, history of CHF, history of GI bleed with EGD 10/04/2017 showed large duodenal bulb ulcer. Status post treatment with epinephrine injection. Underwent subsequent IR guided  embolization of GDA.   He was admitted to the hospital on August 11, 2018 with abdominal pain, nausea vomiting.  He had small amount of blood in the vomiting.  CT scan on initial evaluation without contrast showed distended gallbladder with the gallbladder neck stone.  Gallbladder was not well visualized on the ultrasound.  HIDA scan showed delay in visualization of the gallbladder compared to small bowel which could be from chronic cholecystitis.  He was also diagnosed with bladder outlet obstruction and was seen by urology and had urinary catheter placed.   GI is consulted for further evaluation of coffee-ground material in the vomiting.  Patient seen and examined at bedside.  He is confused.  Discussed with nursing staff.  No reported black tarry stool.  Only small amount of dark liquid noted in a container.  Unable to obtain meaningful history from patient.    Past Medical History:  Diagnosis Date  . Arthritis   . BPH (benign prostatic hypertrophy)   . Cardiomyopathy ([place])    a. thought to be ischemic with high risk MPS EF 23%. Reluctant to do cath due to CKD  . Cataract   . Chronic kidney disease   . Dysrhythmia   . GERD (gastroesophageal reflux disease)   . Hypertension   . PPD positive    a. HX POSITIVE PPD WITH NEGATIVE CXR  . Status post placement of cardiac pacemaker    a. 04/2015: bradycardic arrest s/p Biotronik serial #[id number] pacemaker.  . Strabismus    a. right eye  . Symptomatic bradycardia    a. s/p Biotronik  serial [id number] pacemaker.  . Syncope and collapse    a. in 2014 and again in 08/2014. Thought to be vasovagal   . Varicosities of leg     Past Surgical History:  Procedure Laterality Date  . CATARACT EXTRACTION W/ INTRAOCULAR LENS IMPLANT     BOTH EYES  . EP IMPLANTABLE DEVICE N/A 05/13/2015   Procedure: Pacemaker Implant;  Surgeon: Evans Lance, MD;  Location: [facility];  Service: Cardiovascular;  Laterality: N/A;  . ESOPHAGOGASTRODUODENOSCOPY (EGD) WITH PROPOFOL Left 10/04/2017   Procedure: ESOPHAGOGASTRODUODENOSCOPY (EGD) WITH PROPOFOL;  Surgeon: Ronnette Juniper, MD;  Location: [facility];  Service: Gastroenterology;  Laterality: Left;  . IR ANGIOGRAM SELECTIVE EACH ADDITIONAL VESSEL  10/04/2017  . IR ANGIOGRAM VISCERAL SELECTIVE  10/04/2017  . IR EMBO ART  VEN HEMORR LYMPH EXTRAV  INC GUIDE ROADMAPPING  10/04/2017  . IR US GUIDE VASC ACCESS RIGHT  10/04/2017  . TONSILLECTOMY     "I was young" (10/15/2012)  . TOTAL KNEE ARTHROPLASTY Left 12/01/2012   Procedure: LEFT TOTAL KNEE ARTHROPLASTY;  Surgeon: [name], MD;  Location: [facility];  Service: Orthopedics;  Laterality: Left;  . TRANSURETHRAL RESECTION OF PROSTATE  2006    Prior to Admission medications   Medication Sig Start Date End Date Taking? Authorizing Provider  acetaminophen (TYLENOL) 500 MG tablet Take 500 mg by mouth at bedtime as needed for headache (pain).   Yes [provider]  aspirin EC 81  MG tablet Take 1 tablet (81 mg total) by mouth daily. If hemoglobin is stable 10/17/17  Yes Dhungel, Nishant, MD  carvedilol (COREG) 6.25 MG tablet Take 6.25 mg by mouth 2 (two) times daily with a meal.    Yes [provider]  cholecalciferol (VITAMIN D) 1000 UNITS tablet Take 1,000 Units by mouth 2 (two) times daily.   Yes [provider]  docusate sodium (COLACE) 100 MG capsule Take 100 mg by mouth 2 (two) times daily as needed for mild constipation.   Yes [provider]  dorzolamide-timolol  (COSOPT) 22.3-6.8 MG/ML ophthalmic solution Place 1 drop into both eyes 2 (two) times daily.  08/21/14  Yes [provider]  furosemide (LASIX) 40 MG tablet Take 1 tablet (40 mg total) by mouth every other day. 12/20/17  Yes Isaiah Serge, NP  latanoprost (XALATAN) 0.005 % ophthalmic solution Place 1 drop into both eyes at bedtime.   Yes [provider]  nitroGLYCERIN (NITROSTAT) 0.4 MG SL tablet Place 0.4 mg under the tongue every 5 (five) minutes as needed for chest pain (max 3 doses within 15 minutes call 911).   Yes [provider]  pantoprazole (PROTONIX) 40 MG tablet Take 1 tablet (40 mg total) by mouth 2 (two) times daily. 10/10/17 10/10/18 Yes Dhungel, Nishant, MD    Scheduled Meds: . carvedilol  6.25 mg Oral BID WC  . Chlorhexidine Gluconate Cloth  6 each Topical Q0600  . cholecalciferol  1,000 Units Oral BID  . dorzolamide-timolol  1 drop Both Eyes BID  . latanoprost  1 drop Both Eyes QHS  .  morphine injection  2 mg Intravenous Once  . mupirocin ointment  1 application Nasal BID  . pantoprazole (PROTONIX) IV  40 mg Intravenous Q12H  . tamsulosin  0.4 mg Oral QPC supper   Continuous Infusions: . sodium chloride    . ampicillin-sulbactam (UNASYN) IV    . lactated ringers 75 mL/hr at 08/13/18 1843  . methocarbamol (ROBAXIN) IV    . sodium chloride 500 mL (09/12/2018 0643)   PRN Meds:.acetaminophen **OR** acetaminophen, docusate sodium, methocarbamol (ROBAXIN) IV, nitroGLYCERIN, ondansetron **OR** ondansetron (ZOFRAN) IV, oxyCODONE  Allergies as of 08/22/2018  . (No Known Allergies)    Family History  Problem Relation Age of Onset  . Heart failure Mother   . Hypertension Mother   . Pneumonia Father   . Hypertension Brother   . Heart attack Neg Hx   . Stroke Neg Hx     Social History   Socioeconomic History  . Marital status: Married    Spouse name: Not on file  . Number of children: Not on file  . Years of education: Not on file  . Highest  education level: Not on file  Occupational History  . Occupation: Retired  Scientific laboratory technician  . Financial resource strain: Not on file  . Food insecurity:    Worry: Not on file    Inability: Not on file  . Transportation needs:    Medical: Not on file    Non-medical: Not on file  Tobacco Use  . Smoking status: Never Smoker  . Smokeless tobacco: Never Used  Substance and Sexual Activity  . Alcohol use: No  . Drug use: No  . Sexual activity: Never  Lifestyle  . Physical activity:    Days per week: Not on file    Minutes per session: Not on file  . Stress: Not on file  Relationships  . Social connections:  Talks on phone: Not on file    Gets together: Not on file    Attends religious service: Not on file    Active member of club or organization: Not on file    Attends meetings of clubs or organizations: Not on file    Relationship status: Not on file  . Intimate partner violence:    Fear of current or ex partner: Not on file    Emotionally abused: Not on file    Physically abused: Not on file    Forced sexual activity: Not on file  Other Topics Concern  . Not on file  Social History Narrative   Lives with his wife.    Review of Systems: Not able to obtain  Physical Exam: Vital signs: Vitals:   09/08/2018 0600 September 08, 2018 0800  BP: 92/66 105/66  Pulse:    Resp: (!) 32 16  Temp:    SpO2:     Last BM Date: 08/13/18 General:   Very ill-appearing patient.  In moderate distress Lungs: Decreased breath sounds bilaterally.  Somewhat mild respiratory distress noted. Heart:  Regular rate and rhythm; no murmurs, clicks, rubs,  or gallops. Abdomen: Generalized discomfort, soft, bowel sounds present.  No peritoneal signs Rectal:  Deferred  GI:  Lab Results: Recent Labs    08/12/18 1142 08/13/18 0538 Sep 08, 2018 0251  WBC 15.1* 10.6* 10.8*  HGB 11.2* 9.4* 9.8*  HCT 37.6* 32.0* 33.1*  PLT 182 162 200   BMET Recent Labs    08/12/18 1142 08/13/18 0538 08-Sep-2018 0251  NA  144 141 138  K 5.0 4.6 4.6  CL 112* 109 107  CO2 22 18* 21*  GLUCOSE 116* 122* 166*  BUN 74* 82* 94*  CREATININE 3.87* 4.33* 4.41*  CALCIUM 8.6* 8.3* 8.4*   LFT Recent Labs    Sep 08, 2018 0251  PROT 5.9*  ALBUMIN 2.7*  AST 49*  ALT 135*  ALKPHOS 78  BILITOT 1.1   PT/INR Recent Labs    08/11/18 1359  LABPROT 14.7  INR 1.16     Studies/Results: Nm Hepatobiliary Liver Func  Result Date: 08/12/2018 CLINICAL DATA:  Upper abdominal pain EXAM: NUCLEAR MEDICINE HEPATOBILIARY IMAGING TECHNIQUE: Sequential images of the abdomen were obtained out to 60 minutes following intravenous administration of radiopharmaceutical. RADIOPHARMACEUTICALS:  5.3 mCi Tc-60m  Choletec IV COMPARISON:  Ultrasound right upper quadrant August 11, 2018 FINDINGS: Prompt uptake and biliary excretion of activity by the liver is seen. Gallbladder activity is visualized, consistent with patency of cystic duct. Biliary activity passes into small bowel, consistent with patent common bile duct. Note that there is slight delay in visualization of the gallbladder compared to small bowel. IMPRESSION: Patency of the cystic and common bile ducts, evidenced by visualization of gallbladder and small bowel. Gallbladder visualization is slightly delayed compared to visualization of small bowel. Note that a degree of underlying chronic cholecystitis can present in this manner. Electronically Signed   By: Lowella Grip III M.D.   On: 08/12/2018 10:02   Dg Chest Port 1 View  Result Date: 2018/09/08 CLINICAL DATA:  Respiratory distress EXAM: PORTABLE CHEST 1 VIEW COMPARISON:  09/22/2017 FINDINGS: Eventration of the right hemidiaphragm. Right upper lobe opacity/pneumonia. Left lung is clear. No pleural effusion or pneumothorax. The heart is normal in size.  Left subclavian pacemaker. IMPRESSION: Right upper lobe opacity/pneumonia. Follow-up chest radiographs are suggested in 4-6 weeks to confirm resolution. Electronically Signed    By: Julian Hy M.D.   On: 2018/09/08 02:18    Impression/Plan: -  Coffee-ground emesis.  No melena.  No significant drop in hemoglobin. -Right upper quadrant pain with HIDA scan suggestive of possible chronic cholecystitis but no acute changes. -Mild elevated LFTs. - Respiratory  insufficiency. - Recent NSTEMI -Confusion history of GI bleed with EGD 10/04/2017 showed large duodenal bulb ulcer. Status post treatment with epinephrine injection. Underwent subsequent IR guided  embolization of GDA.     Recommendations -------------------------- -Patient with multiple medical issues.  No life-threatening GI bleed. -Recommend conservative management with IV  BID PPI for now. -Monitor H&H.  GI will follow.      LOS: 1 day   Otis Brace  MD, FACP 08-27-18, 8:39 AM  Contact #  [phone]

## 2018-08-30 NOTE — Evaluation (Signed)
SLP Cancellation Note  Patient Details Name: Bradley [name] MRN: [id number] DOB: [birth year]   Cancelled treatment:       Reason Eval/Treat Not Completed: Medical issues which prohibited therapy(pt now with coffee ground emesis per RN, will attempt next date)   Macario Golds 2018-08-23, 11:30 AM   Luanna Salk, [facility] SLP [place] Pager [phone] Office [phone]

## 2018-08-30 NOTE — Progress Notes (Signed)
Late entry:  Entered patients room, found him unresponsive, had vomited large amount of coffee ground emesis.  Dr. Florene Glen notified came to bedside, NG tube placed 1000 cc returned. Pt no longer breathing, pupils fixed and dilated, pulseless. Pronounced at 1825. Death verified by Leonie Man RN.  Dr Florene Glen notified daughter. Daughter and other family at bedside.  Spouse called by daughter. Cha[place]in called.

## 2018-08-30 NOTE — Progress Notes (Signed)
PT Cancellation Note  Patient Details Name: Bradley [name] MRN: [id number] DOB: [birth year]   Cancelled Treatment:    Reason Eval/Treat Not Completed: Medical issues which prohibited therapy; pt with continued coffee ground emesis and dyspneic, defer PT eval at this time   [facility] 08-16-2018, 11:00 AM

## 2018-08-30 NOTE — Progress Notes (Signed)
Called into room by patient. 1520. Patient is holding chest but when asked about chest discomfort states no. Patient has dark brown vomit. New wheezing heard. BP 123/79. On call provider notified.

## 2018-08-30 NOTE — Death Summary Note (Signed)
DEATH SUMMARY   Patient Details  Name: Bradley [name] MRN: [id number] DOB: [birth year]  Admission/Discharge Information   Admit Date:  04-Sep-2018  Date of Death: Date of Death: September 08, 2018  Time of Death: Time of Death: 1823-11-13  Length of Stay: 1  Referring Physician: [name], MD   Reason(s) for Hospitalization  nausea, vomiting, abdominal pain  Diagnoses  Preliminary cause of death:  Secondary Diagnoses (including complications and co-morbidities):  Principal Problem:   Acute calculous cholecystitis Active Problems:   Chest pain   Chronic kidney disease, stage IV (severe) (HCC)   Chronic combined systolic (congestive) and diastolic (congestive) heart failure (HCC)   Pneumonia of right upper lobe due to infectious organism ([place])   SBO (small bowel obstruction) ([place])   Coffee ground emesis   Aspiration pneumonia [facility])   Brief Hospital Course (including significant findings, care, treatment, and services provided and events leading to death)  Bradley [name] is Bradley [name] 85 y.o. year old male with Bradley [name] hx of CAD s/p NSTEMI November 12, 2017, HFrEF/pEF (EF 25-30%), HTN, s/p pacemaker, CKD, admitted on 08/28/2018 with nausea, vomiting and abdominal pain.  Initially imaging findings concerning for cholecystitis, but pt had w/u with surgery including HIDA scan which was negative for acute cholecystitis.  On 1/15 he was noted to have increasing renal function and was given gentle IVF.  He had rapid response overnight 1/15-1/16 after he had episode vomiting, chest discomfort, and had CXR with findings c/w aspiration pneumonia.  He was transferred to the ICU and abx were restarted.  Noted to have elevated troponin, but EKG appeared c/w prior and he denied further CP.  Surgery asked to reevaluate pt, but again suspected cholecystitis unlikely.  Due to pts emesis which was dark, concerning for coffee ground in appearance, GI was c/s and recommended IV BID PPI.  Renal also c/s given worsening renal function.  CT  chest/abdomen pelvis was obtained showing findings concerning for small bowel obstruction.  Unfortunately, shortly after the pt came back from CT, he was found unresponsive after episode of vomiting dark brown material.  I called the pts daughter to update her on pts status change.  Pt pronounced at 1825.  Fixed and dilated pupils, no pulse, no respirations.  Expressed condolences to family and discussed w/u up until that point.  See previous progress notes for further details.  Pertinent Labs and Studies  Significant Diagnostic Studies Ct Abdomen Pelvis Wo Contrast  Result Date: 08-Sep-2018 CLINICAL DATA:  Shortness of breath, nausea, vomiting, RIGHT upper quadrant pain, hypotension, renal dysfunction, history of stage IV chronic kidney disease, hypertension, dementia, ischemic cardiomyopathy, CHF, NSTEMI EXAM: CT CHEST, ABDOMEN AND PELVIS WITHOUT CONTRAST TECHNIQUE: Multidetector CT imaging of the chest, abdomen and pelvis was performed following the standard protocol without IV contrast. Sagittal and coronal MPR images reconstructed from axial data set. IV contrast not utilized due to renal dysfunction. No oral contrast administered. COMPARISON:  CT abdomen and pelvis 08/11/2018 FINDINGS: CT CHEST FINDINGS Cardiovascular: Atherosclerotic calcifications aorta, coronary arteries, proximal great vessels. Aorta upper normal caliber 3.8 cm diameter. Minimal pericardial effusion. Minimal enlargement of cardiac chambers. Mediastinum/Nodes: Esophagus is fluid-filled distally which could be related to reflux or impaired motility. Proximal esophagus is contains Bradley [name] small amount of air and fluid. Base of cervical region normal appearance. No definite thoracic adenopathy. Lungs/Pleura: Subsegmental atelectasis bilaterally in the lower lobes and in the posterior RIGHT upper lobe. Remaining lungs clear. No acute infiltrate or pneumothorax. Minimal RIGHT pleural effusion. Distal trachea and lower lobe airways  appear  narrowed but this may be in part artifact related to respiratory motion. Musculoskeletal: Bones demineralized. CT ABDOMEN PELVIS FINDINGS Hepatobiliary: Distended hydropic gallbladder 6.4 cm transverse containing Bradley [name] dependent higher attenuation fluid level question sludge versus small calculi; minimal pericholecystic infiltrative changes. Not exclude acute cholecystitis. No definite biliary dilatation. Liver unremarkable. Pancreas: Atrophic pancreas without definite mass Spleen: Small, unremarkable Adrenals/Urinary Tract: Adrenal glands normal appearance. Renal cortical atrophy with suspect small exophytic RIGHT renal cysts. No hydronephrosis or hydroureter. Tiny nonobstructing RIGHT renal calculi noted. Bladder decompressed by Foley catheter. Multiple small foci of gas are seen adjacent to the urinary bladder, based on prior ultrasound suspect these represent small foci of gas within collapsed bladder diverticula noted on previous study. Minimal chronic irregularity of the anterior bladder wall again seen. Stomach/Bowel: Colon decompressed. Distal small bowel decompressed. Marked dilatation of proximal to mid small bowel with associated gastric distension. Transition from dilated to nondilated small bowel occurs in the anterior RIGHT mid abdomen, question adhesion. Mesenteric edema of the associated dilated small bowel loops. No definite bowel wall thickening or pneumatosis. Few small bowel diverticula containing gas identified at nondilated small bowel loops in the RIGHT mid abdomen. Surgical clips adjacent to the medial wall of the duodenum, has patient had prior duodenal or ulcer surgery? Interposition of bowel loops between liver and RIGHT diaphragm. Vascular/Lymphatic: Scattered atherosclerotic calcifications aorta iliac arteries, coronary arteries. No portal venous gas. Aorta normal caliber. No adenopathy. Reproductive: Few prostatic calcifications without significant enlargement Other: Small amount of free  fluid in pelvis. No free air. No hernia. Musculoskeletal: Diffuse osseous demineralization. Degenerative disc and facet disease changes lumbar spine. IMPRESSION: Dilated proximal and decompressed distal small bowel loops compatible with small bowel obstruction, transition zone in the anterior RIGHT mid abdomen, raising question of adhesions; patient has surgical clips adjacent to the duodenum. Associated minimal mesenteric edema and free pelvic fluid without evidence of free air, bowel wall thickening or pneumatosis. Distended question hydropic gallbladder with mild pericholecystic infiltrative changes suspicious for acute cholecystitis, demonstrating Bradley [name] dependent higher density layer which could represent sludge, calculi or vicarious excretion of prior contrast. Collapsed urinary bladder with multiple bladder diverticula. Scattered atelectasis in lower lobes and RIGHT upper lobe. Aortic Atherosclerosis (ICD10-I70.0). Electronically Signed   By: Lavonia Dana M.D.   On: 09-03-18 17:29   Ct Abdomen Pelvis Wo Contrast  Result Date: 08/11/2018 CLINICAL DATA:  Abdominal pain, nausea, and vomiting. EXAM: CT ABDOMEN AND PELVIS WITHOUT CONTRAST TECHNIQUE: Multidetector CT imaging of the abdomen and pelvis was performed following the standard protocol without IV contrast. COMPARISON:  None. FINDINGS: Lower chest: Atelectasis in the lung bases. Cardiac enlargement. Coronary artery calcifications. Hepatobiliary: No focal liver lesions. Gallbladder is distended with stone in the region of the gallbladder neck. Increased density of the bile suggesting sludge. No inflammatory infiltration or wall thickening. No bile duct dilatation. Pancreas: Unremarkable. No pancreatic ductal dilatation or surrounding inflammatory changes. Spleen: Normal in size without focal abnormality. Adrenals/Urinary Tract: No adrenal gland nodules. Punctate stone in the midpole right kidney. No hydronephrosis or hydroureter. Bladder is somewhat  decompressed. There is diffuse bladder wall thickening with multiple bladder diverticula. No stones. Stomach/Bowel: Stomach is within normal limits. Appendix appears normal. No evidence of bowel wall thickening, distention, or inflammatory changes. Colonic diverticula without evidence of diverticulitis. Vascular/Lymphatic: Aortic atherosclerosis. No enlarged abdominal or pelvic lymph nodes. Reproductive: Prostate is unremarkable. Other: No abdominal wall hernia or abnormality. No abdominopelvic ascites. Musculoskeletal: Degenerative changes in the spine. IMPRESSION: 1. Distended gallbladder  with stone in the region of the gallbladder neck. Increased density of the bile suggesting sludge. No inflammatory changes. 2. Diffuse bladder wall thickening with multiple bladder diverticula consistent with chronic bladder outlet obstruction. Aortic Atherosclerosis (ICD10-I70.0). Electronically Signed   By: Lucienne Capers M.D.   On: 08/11/2018 01:22   Ct Chest Wo Contrast  Result Date: August 30, 2018 CLINICAL DATA:  Shortness of breath, nausea, vomiting, RIGHT upper quadrant pain, hypotension, renal dysfunction, history of stage IV chronic kidney disease, hypertension, dementia, ischemic cardiomyopathy, CHF, NSTEMI EXAM: CT CHEST, ABDOMEN AND PELVIS WITHOUT CONTRAST TECHNIQUE: Multidetector CT imaging of the chest, abdomen and pelvis was performed following the standard protocol without IV contrast. Sagittal and coronal MPR images reconstructed from axial data set. IV contrast not utilized due to renal dysfunction. No oral contrast administered. COMPARISON:  CT abdomen and pelvis 08/11/2018 FINDINGS: CT CHEST FINDINGS Cardiovascular: Atherosclerotic calcifications aorta, coronary arteries, proximal great vessels. Aorta upper normal caliber 3.8 cm diameter. Minimal pericardial effusion. Minimal enlargement of cardiac chambers. Mediastinum/Nodes: Esophagus is fluid-filled distally which could be related to reflux or impaired  motility. Proximal esophagus is contains Bradley [name] small amount of air and fluid. Base of cervical region normal appearance. No definite thoracic adenopathy. Lungs/Pleura: Subsegmental atelectasis bilaterally in the lower lobes and in the posterior RIGHT upper lobe. Remaining lungs clear. No acute infiltrate or pneumothorax. Minimal RIGHT pleural effusion. Distal trachea and lower lobe airways appear narrowed but this may be in part artifact related to respiratory motion. Musculoskeletal: Bones demineralized. CT ABDOMEN PELVIS FINDINGS Hepatobiliary: Distended hydropic gallbladder 6.4 cm transverse containing Bradley [name] dependent higher attenuation fluid level question sludge versus small calculi; minimal pericholecystic infiltrative changes. Not exclude acute cholecystitis. No definite biliary dilatation. Liver unremarkable. Pancreas: Atrophic pancreas without definite mass Spleen: Small, unremarkable Adrenals/Urinary Tract: Adrenal glands normal appearance. Renal cortical atrophy with suspect small exophytic RIGHT renal cysts. No hydronephrosis or hydroureter. Tiny nonobstructing RIGHT renal calculi noted. Bladder decompressed by Foley catheter. Multiple small foci of gas are seen adjacent to the urinary bladder, based on prior ultrasound suspect these represent small foci of gas within collapsed bladder diverticula noted on previous study. Minimal chronic irregularity of the anterior bladder wall again seen. Stomach/Bowel: Colon decompressed. Distal small bowel decompressed. Marked dilatation of proximal to mid small bowel with associated gastric distension. Transition from dilated to nondilated small bowel occurs in the anterior RIGHT mid abdomen, question adhesion. Mesenteric edema of the associated dilated small bowel loops. No definite bowel wall thickening or pneumatosis. Few small bowel diverticula containing gas identified at nondilated small bowel loops in the RIGHT mid abdomen. Surgical clips adjacent to the medial wall  of the duodenum, has patient had prior duodenal or ulcer surgery? Interposition of bowel loops between liver and RIGHT diaphragm. Vascular/Lymphatic: Scattered atherosclerotic calcifications aorta iliac arteries, coronary arteries. No portal venous gas. Aorta normal caliber. No adenopathy. Reproductive: Few prostatic calcifications without significant enlargement Other: Small amount of free fluid in pelvis. No free air. No hernia. Musculoskeletal: Diffuse osseous demineralization. Degenerative disc and facet disease changes lumbar spine. IMPRESSION: Dilated proximal and decompressed distal small bowel loops compatible with small bowel obstruction, transition zone in the anterior RIGHT mid abdomen, raising question of adhesions; patient has surgical clips adjacent to the duodenum. Associated minimal mesenteric edema and free pelvic fluid without evidence of free air, bowel wall thickening or pneumatosis. Distended question hydropic gallbladder with mild pericholecystic infiltrative changes suspicious for acute cholecystitis, demonstrating Pansy Ostrovsky dependent higher density layer which could represent sludge, calculi or vicarious  excretion of prior contrast. Collapsed urinary bladder with multiple bladder diverticula. Scattered atelectasis in lower lobes and RIGHT upper lobe. Aortic Atherosclerosis (ICD10-I70.0). Electronically Signed   By: Lavonia Dana M.D.   On: 2018-09-01 17:29   Nm Hepatobiliary Liver Func  Result Date: 08/12/2018 CLINICAL DATA:  Upper abdominal pain EXAM: NUCLEAR MEDICINE HEPATOBILIARY IMAGING TECHNIQUE: Sequential images of the abdomen were obtained out to 60 minutes following intravenous administration of radiopharmaceutical. RADIOPHARMACEUTICALS:  5.3 mCi Tc-70m  Choletec IV COMPARISON:  Ultrasound right upper quadrant August 11, 2018 FINDINGS: Prompt uptake and biliary excretion of activity by the liver is seen. Gallbladder activity is visualized, consistent with patency of cystic duct. Biliary  activity passes into small bowel, consistent with patent common bile duct. Note that there is slight delay in visualization of the gallbladder compared to small bowel. IMPRESSION: Patency of the cystic and common bile ducts, evidenced by visualization of gallbladder and small bowel. Gallbladder visualization is slightly delayed compared to visualization of small bowel. Note that Bradley [name] degree of underlying chronic cholecystitis can present in this manner. Electronically Signed   By: Lowella Grip III M.D.   On: 08/12/2018 10:02   Dg Chest Port 1 View  Result Date: Sep 01, 2018 CLINICAL DATA:  Respiratory distress EXAM: PORTABLE CHEST 1 VIEW COMPARISON:  09/22/2017 FINDINGS: Eventration of the right hemidiaphragm. Right upper lobe opacity/pneumonia. Left lung is clear. No pleural effusion or pneumothorax. The heart is normal in size.  Left subclavian pacemaker. IMPRESSION: Right upper lobe opacity/pneumonia. Follow-up chest radiographs are suggested in 4-6 weeks to confirm resolution. Electronically Signed   By: Julian Hy M.D.   On: 2018-09-01 02:18   US Abdomen Limited Ruq  Result Date: 08/11/2018 CLINICAL DATA:  Epigastric pain EXAM: ULTRASOUND ABDOMEN LIMITED RIGHT UPPER QUADRANT COMPARISON:  CT abdomen/pelvis dated 08/11/2018 at 0046 hours FINDINGS: Gallbladder: Poorly visualized/evaluated due to patient discomfort. Positive sonographic Murphy's sign. Common bile duct: Not discretely visualized. Liver: At the upper limits of normal for parenchymal echogenicity. No focal hepatic lesion is seen. Portal vein is patent on color Doppler imaging with normal direction of blood flow towards the liver. IMPRESSION: Gallbladder is poorly visualized on ultrasound. Positive sonographic Murphy's sign. However, there were no pericholecystic inflammatory changes on CT to suggest acute cholecystitis. If there is continued clinical concern, consider hepatobiliary nuclear medicine scan. Electronically Signed   By:  Julian Hy M.D.   On: 08/11/2018 02:25    Microbiology Recent Results (from the past 240 hour(s))  MRSA PCR Screening     Status: Abnormal   Collection Time: 08/11/18  9:00 AM  Result Value Ref Range Status   MRSA by PCR POSITIVE (Bradley [name]) NEGATIVE Final    Comment:        The GeneXpert MRSA Assay (FDA approved for NASAL specimens only), is one component of Bradley [name] comprehensive MRSA colonization surveillance program. It is not intended to diagnose MRSA infection nor to guide or monitor treatment for MRSA infections. RESULT CALLED TO, READ BACK BY AND VERIFIED WITH: BLOCK,D. RN AT 1108 08/11/18 MULLINS,T Performed at [facility], [place] [address] [place], [place] [address]   Culture, Urine     Status: Abnormal   Collection Time: 08/11/18  3:14 PM  Result Value Ref Range Status   Specimen Description   Final    URINE, CATHETERIZED Performed at [facility] [address], [place], [place] [address]    Special Requests   Final    NONE Performed at [facility], [place] [address], [place], [place] [address]    Culture (Bradley [name])  Final    <10,000 COLONIES/mL INSIGNIFICANT GROWTH Performed at [facility] [address] [place], [place] [address]    Report Status 08/12/2018 FINAL  Final    Lab Basic Metabolic Panel: Recent Labs  Lab 08/04/2018 2345 08/11/18 0500 08/12/18 1142 08/13/18 0538 2018/08/18 0251  NA 138 141 144 141 138  K 5.1 4.9 5.0 4.6 4.6  CL 108 107 112* 109 107  CO2 23 23 22  18* 21*  GLUCOSE 137* 147* 116* 122* 166*  BUN 68* 68* 74* 82* 94*  CREATININE 3.88* 3.61* 3.87* 4.33* 4.41*  CALCIUM 8.5* 8.5* 8.6* 8.3* 8.4*  MG  --   --  2.4  --   --   PHOS  --   --  5.1*  --   --    Liver Function Tests: Recent Labs  Lab 08/04/2018 2345 08/11/18 0500 08/12/18 1142 08/13/18 0538 08-18-18 0251  AST 17 23 176* 72* 49*  ALT 16 18 320* 192* 135*  ALKPHOS 51 51 86 68 78  BILITOT 0.6 0.8 1.5* 0.9 1.1   PROT 6.9 6.9 6.7 5.8* 5.9*  ALBUMIN 3.5 3.7 3.1* 2.6* 2.7*   Recent Labs  Lab 08/07/2018 2345  LIPASE 51   No results for input(s): AMMONIA in the last 168 hours. CBC: Recent Labs  Lab 08/07/2018 2345 08/11/18 0500 08/12/18 1142 08/13/18 0538 2018-08-18 0251 18-Aug-2018 0849 08/18/2018 1622  WBC 6.8 7.6 15.1* 10.6* 10.8*  --   --   NEUTROABS  --   --  13.4*  --   --   --   --   HGB 9.8* 10.1* 11.2* 9.4* 9.8* 9.3* 8.1*  HCT 32.9* 34.0* 37.6* 32.0* 33.1* 32.7* 27.6*  MCV 94.3 92.6 94.5 92.5 91.7  --   --   PLT 179 205 182 162 200  --   --    Cardiac Enzymes: Recent Labs  Lab 08-18-2018 0251 August 18, 2018 0849 Aug 18, 2018 1622  TROPONINI 0.38* 0.40* 0.39*   Sepsis Labs: Recent Labs  Lab 08/11/18 0500 08/11/18 1136 08/11/18 1359 08/12/18 1142 08/13/18 0538 08/18/18 0251 08/18/2018 0849  WBC 7.6  --   --  15.1* 10.6* 10.8*  --   LATICACIDVEN  --  3.4* 3.0*  --   --  2.0* 2.3*    Procedures/Operations  See previous notes   Fayrene Helper 08/15/2018, 12:59 AM

## 2018-08-30 NOTE — Progress Notes (Signed)
Pharmacy Antibiotic Note  Bradley [name] is a 85 y.o. male admitted on 08/27/2018 with abd pain, N/V.  Pharmacy has been consulted for Unasy dosing for aspiration PNA.  He got Rocephin 1 gm and azithromycin 500 mg around 4 am. WBC 10.8, Scr 4.41, Lactate 2.   Plan: Unasyn 3 gm IV q12 F/u renal fxn, WBC, temp, culture data   Height: 6' (182.9 cm) Weight: 187 lb 2.7 oz (84.9 kg) IBW/kg (Calculated) : 77.6  Temp (24hrs), Avg:97.7 F (36.5 C), Min:97.6 F (36.4 C), Max:97.8 F (36.6 C)  Recent Labs  Lab 08/27/2018 2345 08/11/18 0500 08/11/18 1136 08/11/18 1359 08/12/18 1142 08/13/18 0538 09/04/2018 0251  WBC 6.8 7.6  --   --  15.1* 10.6* 10.8*  CREATININE 3.88* 3.61*  --   --  3.87* 4.33* 4.41*  LATICACIDVEN  --   --  3.4* 3.0*  --   --  2.0*    Estimated Creatinine Clearance: 12.7 mL/min (A) (by C-G formula based on SCr of 4.41 mg/dL (H)).    No Known Allergies  Antimicrobials this admission:  1/13 zosyn>>1/14 1/16 CTX/azith x 1 dose ~ 04 am 1/16 Unasyn >> Dose adjustments this admission:   Microbiology results:  1/13 MRSA PCR: positive 1/13 UCx: < 10K - final 1/16 hepatitis panel>>  Thank you for allowing pharmacy to be a part of this patient's care.  Eudelia Bunch, Pharm.D [phone] 09/04/18 8:23 AM

## 2018-08-30 NOTE — Progress Notes (Addendum)
**Note De-Identified vi Obfusction** PROGRESS NOTE    Bradley [name]  VFI:[id number] DOB: 1928-03-09 DOA: 07/31/2018 PCP: Josett Huddle, MD   Brief Nrrtive:  R Pfiester Arledgeis n 5yomle PMH CAD s/p NSTEMI 09/2017, combined CHF (EF 25-30% on ECHO 08/2017), HTN, s/p pcemker, CKD dmitted on 08/28/2018 with bdominl pin, nuse vomiting or concerns bout possible cute cholecystitis however work-up ws negtive for cute cholecystitis dvnce diet s tolerted s of 08/12/2018  Assessment & Pln:   Principl Problem:   Acute clculous cholecystitis Active Problems:   Chest pin   Chronic kidney disese, stge IV (severe) (HCC)   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 (HCC)   Pneumoni of right upper lobe due to infectious orgnism [facility])  Right Upper Lobe Pneumoni:  In setting of vomiting, suspect spirtion CXR with RUL opcity/pneumoni (needs f/u imging in  few weeks) Currently on ceftrixone/zithromycin -> suspect typicl infection less likely will switch to unsyn (lso covers possible intrbdominl infection) Afebrile, slightly elevted wbc count NPO right now, SLP  Nuse  Vomiting  Drk Emesis:  Drk concern for coffee ground emesis, this ws concern t presenttion s well pprently, his hemoccult ws negtive on rectl exm per review of EDP note Could be relted to biliry? ?cholecystitis, HIDA with ptent cystic nd CBD, but noted "chronic cholecystitis could present in this mtter" Ddx lso includes GI bleed with drk, emesis, suspect this is less likely with stble H/H, but will continue IV PPI nd discuss with gstroenterology - will trend H/H Surgery plnning to come to see him for reevlution  1) bdominl pin---  CT bdomen nd pelvis suggested gllbldder distention with stone in region of gllbldder neck nd incresed density of bile suggesting sludge RUQ Kore inconclusive (no pericholecystic inflmmtory chnges suggestive of cholecystitis,  but positive murphy's) HIDA scn with ptent cystic nd CBD with dely in visuliztion in gllbldder compred to smll bowel (" degree of chronic cholecystitis cn present in this mtter") Per surgery, HIDA negtive for cholecystitis, no need for perc drin or surgicl intervention - recommended ADAT -> currently NPO, surgery going to reevlute in setting of bove Unsyn  LFT's elevted 1/14 (up from dmission) to 176/320 peked, improving to 72/192 tody (AST/ALT).  Bili mildly elevted on 1/14, continues to improve tody.   Of note, pprently pts symptoms improved fter foley plced (evidence of chronic bldder outlet obstruction on imging) - will need to discuss with urology  3)AKI----cute kidney injury on CKD stge  IV- suspected to be due to nuse vomiting, poor orl intke/dehydrtion compounded by Lsix use prior to dmission Bseline cretinine ppers to be round 2.6 Cretinine rising tody, to 4.41 UA with 6-10 RBC, 30 mg/dl protein CT with evidence of chronic bldder outlet obstruction, but no hydro.  Foley now in plce.  Follow I/O, dily weights (so fr ~500 cc chrted for yesterdy) Gentle IVF, doesn't pper overloded, received bolus overnight Hold lsix Will discuss with nephrology given continued worsening of renl function  2)HFrEF--- EF 25-30% 09/23/2017 Appers euvolemic t this time Coreg 6.25 dily Lsix currently on hold Receiving gentle IVF with ki bove Avoiding ACE/ARB/ARNI given AKI on CKD  Hold ASA  Elevted troponin: likely demnd in setting of bove.  EKG ppers similr to priors.  Denies chest pin t this time.  Follow troponin, continue to monitor.   Hypotension: suspect relted to volume depletion, hs improved with IVF.  Infection s well could be contributing.  Continue to monitor.   Elevted LFT's: Improving tody RUQ Kore s noted **Note De-Identified vi Obfusction** bove Follow heptitis pnel Continue to trend  NGM: likely relted to KI, continue to follow with  IVF  Leukocytosis: febrile, urine cx without sig growth.  Continue to monitor.  bx s bove.  DVT prophylxis: hold heprin, SCD Code Sttus: full code Fmily Communiction: left messge with wife, discussed with dughter.  She confirms full code.  --- ddendum: once pt dughter nd wife t bedside, discussed gin code sttus.  Dughter notes tht her fther hs been coded before nd doesn't wnt tht gin.  Decided no CPR.  When discussing intubtion, they lso note tht he would not wnt intubtion nd to be on life support.  Both pt's dughter nd his wife were in greement tht DNR would be c/w his wishes. Disposition Pln: pending improvement.  Consultnts:   surgery  Procedures:   none  ntimicrobils:  nti-infectives (From dmission, onwrd)   Strt     Dose/Rte Route Frequency Ordered Stop   Februry 08, 2020 0345  zithromycin (ZITHROMX) 500 mg in sodium chloride 0.9 % 250 mL IVPB     500 mg 250 mL/hr over 60 Minutes Intrvenous Dily t bedtime 2018-09-06 0334     Sep 06, 2018 0345  cefTRIXone (ROCEPHIN) 1 g in sodium chloride 0.9 % 100 mL IVPB     1 g 200 mL/hr over 30 Minutes Intrvenous Dily t bedtime 06-Sep-2018 0334     08/11/18 2100  pipercillin-tzobctm (ZOSYN) IVPB 2.25 g     2.25 g 100 mL/hr over 30 Minutes Intrvenous Every 8 hours 08/11/18 1437 08/12/18 2119   08/11/18 1000  pipercillin-tzobctm (ZOSYN) IVPB 2.25 g  Sttus:  Discontinued     2.25 g 100 mL/hr over 30 Minutes Intrvenous Every 8 hours 08/11/18 0237 08/11/18 1437   08/11/18 0145  pipercillin-tzobctm (ZOSYN) IVPB 3.375 g     3.375 g 100 mL/hr over 30 Minutes Intrvenous  Once 08/11/18 0134 08/11/18 0239         Subjective: Confused.  Denies pin. &Ox1. Discussed with dughter over phone, sked her to come in.  Objective: Vitls:   2018-09-06 0445 2018-09-06 0500 Februry 08, 2020 0543 09-06-18 0600  BP:  (S) (!) 83/37 (S) (!) 84/31 92/66  Pulse:      Resp: 17 (!) 22 (!) 31 (!) 32  Temp:        TempSrc:      SpO2:      Weight:      Height:        Intke/Output Summry (Lst 24 hours) t Sep 06, 2018 0754 Lst dt filed t 06-Sep-2018 0500 Gross per 24 hour  Intke 2374.17 ml  Output 525 ml  Net 1849.17 ml   Filed Weights   08/16/2018 2252 09-06-18 0350  Weight: 84.8 kg 84.9 kg    Exmintion:  Generl: No cute distress. Crdiovsculr: Hert sounds show  regulr rte, nd rhythm.  Lungs: diffuse rhonchi, visible brown emesis in mouth bdomen: Soft, mildly diffusely tender to plption  Neurologicl: lert nd oriented 1. Moves ll extremities 4. Crnil nerves II through XII grossly intct. Skin: Wrm nd dry. No rshes or lesions. Extremities: No clubbing or cynosis. No edem.    Dt Reviewed: I hve personlly reviewed following lbs nd imging studies  CBC: Recent Lbs  Lb 08/05/2018 2345 08/11/18 0500 08/12/18 1142 08/13/18 0538 09-06-2018 0251  WBC 6.8 7.6 15.1* 10.6* 10.8*  NEUTROBS  --   --  13.4*  --   --   HGB 9.8* 10.1* 11.2* 9.4* 9.8*  HCT 32.9* 34.0* 37.6* 32.0* 33.1*  MCV **Note De-Identified vi Obfusction** 94.3 92.6 94.5 92.5 91.7  PLT 179 205 182 162 016   Bsic Metbolic Pnel: Recent Lbs  Lb 07/30/2018 2345 08/11/18 0500 08/12/18 1142 08/13/18 0538 09/07/18 0251  N 138 141 144 141 138  K 5.1 4.9 5.0 4.6 4.6  CL 108 107 112* 109 107  CO2 23 23 22  18* 21*  GLUCOSE 137* 147* 116* 122* 166*  BUN 68* 68* 74* 82* 94*  CRETININE 3.88* 3.61* 3.87* 4.33* 4.41*  CLCIUM 8.5* 8.5* 8.6* 8.3* 8.4*  MG  --   --  2.4  --   --   PHOS  --   --  5.1*  --   --    GFR: Estimted Cretinine Clernce: 12.7 mL/min () (by [place] formul bsed on SCr of 4.41 mg/dL (H)). Liver Function Tests: Recent Lbs  Lb 08/05/2018 2345 08/11/18 0500 08/12/18 1142 08/13/18 0538 2018/09/07 0251  ST 17 23 176* 72* 49*  LT 16 18 320* 192* 135*  LKPHOS 51 51 86 68 78  BILITOT 0.6 0.8 1.5* 0.9 1.1  PROT 6.9 6.9 6.7 5.8* 5.9*  LBUMIN 3.5 3.7 3.1* 2.6* 2.7*   Recent Lbs  Lb  08/22/2018 2345  LIPSE 51   No results for input(s): MMONI in the lst 168 hours. Cogultion Profile: Recent Lbs  Lb 08/11/18 1359  INR 1.16   Crdic Enzymes: Recent Lbs  Lb 2018-09-07 0251  TROPONINI 0.38*   BNP (lst 3 results) No results for input(s): PROBNP in the lst 8760 hours. Hb1C: No results for input(s): HGB1C in the lst 72 hours. CBG: No results for input(s): GLUCP in the lst 168 hours. Lipid Profile: No results for input(s): CHOL, HDL, LDLCLC, TRIG, CHOLHDL, LDLDIRECT in the lst 72 hours. Thyroid Function Tests: No results for input(s): TSH, T4TOTL, FREET4, T3FREE, THYROIDB in the lst 72 hours. nemi Pnel: No results for input(s): VITMINB12, FOLTE, FERRITIN, TIBC, IRON, RETICCTPCT in the lst 72 hours. Sepsis Lbs: Recent Lbs  Lb 08/11/18 1136 08/11/18 1359 Sep 07, 2018 0251  LTICCIDVEN 3.4* 3.0* 2.0*    Recent Results (from the pst 240 hour(s))  MRS PCR Screening     Sttus: bnorml   Collection Time: 08/11/18  9:00 M  Result Vlue Ref Rnge Sttus   MRS by PCR POSITIVE () NEGTIVE Finl    Comment:        The GeneXpert MRS ssy (FD pproved for NSL specimens only), is one component of  comprehensive MRS coloniztion surveillnce progrm. It is not intended to dignose MRS infection nor to guide or monitor tretment for MRS infections. RESULT CLLED TO, RED BCK BY ND VERIFIED WITH: BLOCK,D. RN T 1108 08/11/18 MULLINS,T Performed t [facility], [place] [address], [place], [place] [address]   Culture, Urine     Sttus: bnorml   Collection Time: 08/11/18  3:14 PM  Result Vlue Ref Rnge Sttus   Specimen Description   Finl    URINE, CTHETERIZED Performed t [facility] [address] [place], [place] [address]    Specil Requests   Finl    NONE Performed t [facility], [place] [address], [place], [place] [address]    Culture ()  Finl     <10,000 COLONIES/mL INSIGNIFICNT GROWTH Performed t [facility] [address] [place], [place] [address]    Report Sttus 08/12/2018 FINL  Finl         Rdiology Studies: Nm Heptobiliry Liver Func  Result Dte: 08/12/2018 CLINICL DT: **Note De-Identified vi Obfusction** Upper bdominl pin EXAM: NUCLEAR MEDICINE HEPATOBILIARY IMAGING TECHNIQUE: Sequentil imges of the bdomen were obtined out to 60 minutes following intrvenous dministrtion of rdiophrmceuticl. RADIOPHARMACEUTICALS:  5.3 mCi Tc-38m  Choletec IV COMPARISON:  Ultrsound right upper qudrnt Jnury 13, 2020 FINDINGS: Prompt uptke nd biliry excretion of ctivity by the liver is seen. Gllbldder ctivity is visulized, consistent with ptency of cystic duct. Biliry ctivity psses into smll bowel, consistent with ptent common bile duct. Note tht there is slight dely in visuliztion of the gllbldder compred to smll bowel. IMPRESSION: Ptency of the cystic nd common bile ducts, evidenced by visuliztion of gllbldder nd smll bowel. Gllbldder visuliztion is slightly delyed compred to visuliztion of smll bowel. Note tht  degree of underlying chronic cholecystitis cn present in this mnner. Electroniclly Signed   By: Lowell Grip III M.D.   On: 08/12/2018 10:02   Dg Chest Port 1 View  Result Dte: 2018/08/15 CLINICAL DATA:  Respirtory distress EXAM: PORTABLE CHEST 1 VIEW COMPARISON:  09/22/2017 FINDINGS: Eventrtion of the right hemidiphrgm. Right upper lobe opcity/pneumoni. Left lung is cler. No pleurl effusion or pneumothorx. The hert is norml in size.  Left subclvin pcemker. IMPRESSION: Right upper lobe opcity/pneumoni. Follow-up chest rdiogrphs re suggested in 4-6 weeks to confirm resolution. Electroniclly Signed   By: Julin Hy M.D.   On: 08/15/18 02:18        Scheduled Meds: . spirin EC  81 mg Orl Dily  . crvedilol  6.25 mg Orl BID WC  . Chlorhexidine Gluconte  Cloth  6 ech Topicl Q0600  . choleclciferol  1,000 Units Orl BID  . dorzolmide-timolol  1 drop Both Eyes BID  . heprin injection (subcutneous)  5,000 Units Subcutneous Q8H  . ltnoprost  1 drop Both Eyes QHS  .  morphine injection  2 mg Intrvenous Once  . mupirocin ointment  1 ppliction Nsl BID  . pntoprzole (PROTONIX) IV  40 mg Intrvenous Q12H  . tmsulosin  0.4 mg Orl QPC supper   Continuous Infusions: . sodium chloride    . zithromycin Stopped (2018/08/15 0533)  . cefTRIAXone (ROCEPHIN)  IV Stopped (Jn 17, 2020 0421)  . lctted ringers 75 mL/hr t 08/13/18 1843  . methocrbmol (ROBAXIN) IV    . sodium chloride 500 mL (08-15-2018 0643)     LOS: 1 dy    Time spent: over 30 min    Fyrene Helper, MD [facility] Plese refer to [facility] for pger number  If 7PM-7AM, plese contct night-coverge [email or web address] Pssword [facility] 08/15/2018, 7:54 AM

## 2018-08-30 NOTE — Consult Note (Signed)
[facility] Renal Consultation Note  Requesting MD: Florene Glen Indication for Consultation: Acute on chronic renal failure  HPI: Bradley [name] is a 85 y.o. male with ischemic cardiomyopathy, ejection fraction recorded as low as 25%, hypertension, dementia as well as stage IV CKD with creatinine baseline in the mid twos.  Patient has seen Dr. Hollie Salk at [facility] in the past and family is aware that patient is not a good candidate for chronic dialysis.  Patient was admitted on 1/13 with nausea/vomiting/right upper quadrant pain.  A diagnosis of cholecystitis was considered but since ruled out.  Creatinine on presentation was 3.8, it lowered to 3.6 but since then has been rising steadily, at a level of 4.4 today.  Urine output is minimal.  Patient is hypotensive.  Fortunately, he is without complaints today.  Patient's wife and daughter who seems to be the primary decision maker are at the bedside today.  They have noted slow decline of late.  Imaging on 1/13 was negative for hydronephrosis.  Urinalysis does look like possible UTI but has been given Unasyn  Creat  Date/Time Value Ref Range Status  05/14/2016 10:48 AM 2.95 (H) 0.70 - 1.11 mg/dL Final    Comment:      For patients > or = 83 years of age: The upper reference limit for Creatinine is approximately 13% higher for people identified as African-American.      Creatinine, Ser  Date/Time Value Ref Range Status  August 21, 2018 02:51 AM 4.41 (H) 0.61 - 1.24 mg/dL Final  08/13/2018 05:38 AM 4.33 (H) 0.61 - 1.24 mg/dL Final  08/12/2018 11:42 AM 3.87 (H) 0.61 - 1.24 mg/dL Final  08/11/2018 05:00 AM 3.61 (H) 0.61 - 1.24 mg/dL Final  08/03/2018 11:45 PM 3.88 (H) 0.61 - 1.24 mg/dL Final  01/03/2018 10:53 AM 2.63 (H) 0.76 - 1.27 mg/dL Final  12/26/2017 11:42 AM 2.99 (H) 0.76 - 1.27 mg/dL Final  12/20/2017 11:16 AM 3.24 (H) 0.76 - 1.27 mg/dL Final  11/21/2017 02:17 PM 2.95 (H) 0.76 - 1.27 mg/dL Final  11/14/2017  01:46 PM 2.93 (H) 0.76 - 1.27 mg/dL Final  11/07/2017 10:23 AM 3.48 (H) 0.76 - 1.27 mg/dL Final  11/05/2017 10:58 AM 2.97 (H) 0.76 - 1.27 mg/dL Final  10/10/2017 03:00 AM 2.47 (H) 0.61 - 1.24 mg/dL Final  10/09/2017 04:35 AM 2.36 (H) 0.61 - 1.24 mg/dL Final  10/08/2017 04:56 AM 2.53 (H) 0.61 - 1.24 mg/dL Final  10/07/2017 06:30 AM 2.62 (H) 0.61 - 1.24 mg/dL Final  10/06/2017 03:08 AM 2.71 (H) 0.61 - 1.24 mg/dL Final  10/05/2017 08:25 AM 2.94 (H) 0.61 - 1.24 mg/dL Final  10/04/2017 05:33 AM 3.26 (H) 0.61 - 1.24 mg/dL Final  10/03/2017 12:43 PM 3.62 (H) 0.61 - 1.24 mg/dL Final  09/24/2017 09:08 AM 2.75 (H) 0.61 - 1.24 mg/dL Final  09/22/2017 06:16 PM 2.98 (H) 0.61 - 1.24 mg/dL Final  12/25/2016 12:00 PM 3.12 (H) 0.76 - 1.27 mg/dL Final  10/11/2016 01:57 PM 3.29 (H) 0.76 - 1.27 mg/dL Final  09/21/2016 10:55 AM 2.90 (H) 0.76 - 1.27 mg/dL Final  05/12/2015 11:10 PM 2.79 (H) 0.61 - 1.24 mg/dL Final  05/12/2015 01:09 PM 3.00 (H) 0.61 - 1.24 mg/dL Final  05/12/2015 01:00 PM 2.96 (H) 0.61 - 1.24 mg/dL Final  02/02/2015 10:22 AM 2.46 (H) 0.40 - 1.50 mg/dL Final  10/12/2014 10:49 AM 2.38 (H) 0.40 - 1.50 mg/dL Final  09/28/2014 11:01 AM 2.40 (H) 0.40 - 1.50 mg/dL Final  09/23/2014 03:43 AM 2.70 (H)  0.50 - 1.35 mg/dL Final  09/22/2014 04:05 PM 2.75 (H) 0.50 - 1.35 mg/dL Final  09/22/2014 12:41 AM 2.74 (H) 0.50 - 1.35 mg/dL Final  09/21/2014 12:00 PM 2.63 (H) 0.50 - 1.35 mg/dL Final  12/07/2012 04:40 AM 1.59 (H) 0.50 - 1.35 mg/dL Final  12/06/2012 07:30 AM 1.56 (H) 0.50 - 1.35 mg/dL Final  12/05/2012 05:05 AM 1.87 (H) 0.50 - 1.35 mg/dL Final  12/04/2012 05:00 AM 2.05 (H) 0.50 - 1.35 mg/dL Final  12/03/2012 05:16 AM 2.20 (H) 0.50 - 1.35 mg/dL Final  12/02/2012 05:00 AM 2.13 (H) 0.50 - 1.35 mg/dL Final  11/24/2012 12:35 PM 2.00 (H) 0.50 - 1.35 mg/dL Final  10/15/2012 05:45 PM 2.05 (H) 0.50 - 1.35 mg/dL Final  10/15/2012 10:24 AM 2.12 (H) 0.50 - 1.35 mg/dL Final  09/13/2011 07:40 AM 1.90 (H) 0.50 -  1.35 mg/dL Final  09/12/2011 12:30 PM 2.06 (H) 0.50 - 1.35 mg/dL Final  04/08/2010 12:48 PM 1.92 (H) 0.4 - 1.5 mg/dL Final  11/25/2009 07:59 PM 2.06 (H) 0.4 - 1.5 mg/dL Final  05/27/2008 05:30 AM 1.81 (H)  Final  05/26/2008 04:50 AM 2.24 (H)  Final  05/26/2008 01:52 AM 2.47 (H)  Final  05/25/2008 04:00 PM 2.83 (H)  Final     PMHx:   Past Medical History:  Diagnosis Date  . Arthritis   . BPH (benign prostatic hypertrophy)   . Cardiomyopathy ([place])    a. thought to be ischemic with high risk MPS EF 23%. Reluctant to do cath due to CKD  . Cataract   . Chronic kidney disease   . Dysrhythmia   . GERD (gastroesophageal reflux disease)   . Hypertension   . PPD positive    a. HX POSITIVE PPD WITH NEGATIVE CXR  . Status post placement of cardiac pacemaker    a. 04/2015: bradycardic arrest s/p Biotronik serial #[id number] pacemaker.  . Strabismus    a. right eye  . Symptomatic bradycardia    a. s/p Biotronik serial [id number] pacemaker.  . Syncope and collapse    a. in 2014 and again in 08/2014. Thought to be vasovagal   . Varicosities of leg     Past Surgical History:  Procedure Laterality Date  . CATARACT EXTRACTION W/ INTRAOCULAR LENS IMPLANT     BOTH EYES  . EP IMPLANTABLE DEVICE N/A 05/13/2015   Procedure: Pacemaker Implant;  Surgeon: Evans Lance, MD;  Location: [facility];  Service: Cardiovascular;  Laterality: N/A;  . ESOPHAGOGASTRODUODENOSCOPY (EGD) WITH PROPOFOL Left 10/04/2017   Procedure: ESOPHAGOGASTRODUODENOSCOPY (EGD) WITH PROPOFOL;  Surgeon: Ronnette Juniper, MD;  Location: [facility];  Service: Gastroenterology;  Laterality: Left;  . IR ANGIOGRAM SELECTIVE EACH ADDITIONAL VESSEL  10/04/2017  . IR ANGIOGRAM VISCERAL SELECTIVE  10/04/2017  . IR EMBO ART  VEN HEMORR LYMPH EXTRAV  INC GUIDE ROADMAPPING  10/04/2017  . IR US GUIDE VASC ACCESS RIGHT  10/04/2017  . TONSILLECTOMY     "I was young" (10/15/2012)  . TOTAL KNEE ARTHROPLASTY Left 12/01/2012   Procedure: LEFT TOTAL  KNEE ARTHROPLASTY;  Surgeon: [name], MD;  Location: [facility];  Service: Orthopedics;  Laterality: Left;  . TRANSURETHRAL RESECTION OF PROSTATE  2006    Family Hx:  Family History  Problem Relation Age of Onset  . Heart failure Mother   . Hypertension Mother   . Pneumonia Father   . Hypertension Brother   . Heart attack Neg Hx   . Stroke Neg Hx     Social  History:  reports that he has never smoked. He has never used smokeless tobacco. He reports that he does not drink alcohol or use drugs.  Allergies: No Known Allergies  Medications: Prior to Admission medications   Medication Sig Start Date End Date Taking? Authorizing Provider  acetaminophen (TYLENOL) 500 MG tablet Take 500 mg by mouth at bedtime as needed for headache (pain).   Yes [provider]  aspirin EC 81 MG tablet Take 1 tablet (81 mg total) by mouth daily. If hemoglobin is stable 10/17/17  Yes Dhungel, Nishant, MD  carvedilol (COREG) 6.25 MG tablet Take 6.25 mg by mouth 2 (two) times daily with a meal.    Yes [provider]  cholecalciferol (VITAMIN D) 1000 UNITS tablet Take 1,000 Units by mouth 2 (two) times daily.   Yes [provider]  docusate sodium (COLACE) 100 MG capsule Take 100 mg by mouth 2 (two) times daily as needed for mild constipation.   Yes [provider]  dorzolamide-timolol (COSOPT) 22.3-6.8 MG/ML ophthalmic solution Place 1 drop into both eyes 2 (two) times daily.  08/21/14  Yes [provider]  furosemide (LASIX) 40 MG tablet Take 1 tablet (40 mg total) by mouth every other day. 12/20/17  Yes Isaiah Serge, NP  latanoprost (XALATAN) 0.005 % ophthalmic solution Place 1 drop into both eyes at bedtime.   Yes [provider]  nitroGLYCERIN (NITROSTAT) 0.4 MG SL tablet Place 0.4 mg under the tongue every 5 (five) minutes as needed for chest pain (max 3 doses within 15 minutes call 911).   Yes [provider]  pantoprazole (PROTONIX) 40 MG  tablet Take 1 tablet (40 mg total) by mouth 2 (two) times daily. 10/10/17 10/10/18 Yes Dhungel, Flonnie Overman, MD    I have reviewed the patient's current medications.  Labs:  Results for orders placed or performed during the hospital encounter of 08/03/2018 (from the past 48 hour(s))  Comprehensive metabolic panel     Status: Abnormal   Collection Time: 08/13/18  5:38 AM  Result Value Ref Range   Sodium 141 135 - 145 mmol/L   Potassium 4.6 3.5 - 5.1 mmol/L   Chloride 109 98 - 111 mmol/L   CO2 18 (L) 22 - 32 mmol/L   Glucose, Bld 122 (H) 70 - 99 mg/dL   BUN 82 (H) 8 - 23 mg/dL   Creatinine, Ser 4.33 (H) 0.61 - 1.24 mg/dL   Calcium 8.3 (L) 8.9 - 10.3 mg/dL   Total Protein 5.8 (L) 6.5 - 8.1 g/dL   Albumin 2.6 (L) 3.5 - 5.0 g/dL   AST 72 (H) 15 - 41 U/L   ALT 192 (H) 0 - 44 U/L   Alkaline Phosphatase 68 38 - 126 U/L   Total Bilirubin 0.9 0.3 - 1.2 mg/dL   GFR calc non Af Amer 11 (L) >60 mL/min   GFR calc Af Amer 13 (L) >60 mL/min   Anion gap 14 5 - 15    Comment: Performed at [facility], [place] [address] [place], [place] [address]  CBC     Status: Abnormal   Collection Time: 08/13/18  5:38 AM  Result Value Ref Range   WBC 10.6 (H) 4.0 - 10.5 K/uL   RBC 3.46 (L) 4.22 - 5.81 MIL/uL   Hemoglobin 9.4 (L) 13.0 - 17.0 g/dL   HCT 32.0 (L) 39.0 - 52.0 %   MCV 92.5 80.0 - 100.0 fL   MCH 27.2 26.0 - 34.0 pg  MCHC 29.4 (L) 30.0 - 36.0 g/dL   RDW 13.2 11.5 - 15.5 %   Platelets 162 150 - 400 K/uL   nRBC 0.0 0.0 - 0.2 %    Comment: Performed at [facility], [place] [address], [place], [place] [address]  CBC     Status: Abnormal   Collection Time: 2018/08/18  2:51 AM  Result Value Ref Range   WBC 10.8 (H) 4.0 - 10.5 K/uL   RBC 3.61 (L) 4.22 - 5.81 MIL/uL   Hemoglobin 9.8 (L) 13.0 - 17.0 g/dL   HCT 33.1 (L) 39.0 - 52.0 %   MCV 91.7 80.0 - 100.0 fL   MCH 27.1 26.0 - 34.0 pg   MCHC 29.6 (L) 30.0 - 36.0 g/dL   RDW 13.1 11.5 - 15.5 %   Platelets 200 150 - 400  K/uL   nRBC 0.0 0.0 - 0.2 %    Comment: Performed at [facility], [place] [address], [place], [place] [address]  Comprehensive metabolic panel     Status: Abnormal   Collection Time: 08/18/2018  2:51 AM  Result Value Ref Range   Sodium 138 135 - 145 mmol/L   Potassium 4.6 3.5 - 5.1 mmol/L   Chloride 107 98 - 111 mmol/L   CO2 21 (L) 22 - 32 mmol/L   Glucose, Bld 166 (H) 70 - 99 mg/dL   BUN 94 (H) 8 - 23 mg/dL   Creatinine, Ser 4.41 (H) 0.61 - 1.24 mg/dL   Calcium 8.4 (L) 8.9 - 10.3 mg/dL   Total Protein 5.9 (L) 6.5 - 8.1 g/dL   Albumin 2.7 (L) 3.5 - 5.0 g/dL   AST 49 (H) 15 - 41 U/L   ALT 135 (H) 0 - 44 U/L   Alkaline Phosphatase 78 38 - 126 U/L   Total Bilirubin 1.1 0.3 - 1.2 mg/dL   GFR calc non Af Amer 11 (L) >60 mL/min   GFR calc Af Amer 13 (L) >60 mL/min   Anion gap 10 5 - 15    Comment: Performed at [facility], [place] [address], [place], [place] [address]  Lactic acid, plasma     Status: Abnormal   Collection Time: 08/18/2018  2:51 AM  Result Value Ref Range   Lactic Acid, Venous 2.0 (HH) 0.5 - 1.9 mmol/L    Comment: CRITICAL RESULT CALLED TO, READ BACK BY AND VERIFIED WITHVira Blanco RN 1829 Aug 18, 2018 A NAVARRO Performed at [facility], [place] [address], [place], [place] [address]   Troponin I - ONCE - STAT     Status: Abnormal   Collection Time: 08/18/2018  2:51 AM  Result Value Ref Range   Troponin I 0.38 (HH) <0.03 ng/mL    Comment: CRITICAL RESULT CALLED TO, READ BACK BY AND VERIFIED WITH: Newt Lukes [id number] Aug 18, 2018 A NAVARRO Performed at [facility], [place] [address] [place], [place] [address]   Troponin I - Once-Timed     Status: Abnormal   Collection Time: 08-18-2018  8:49 AM  Result Value Ref Range   Troponin I 0.40 (HH) <0.03 ng/mL    Comment: CRITICAL VALUE NOTED.  VALUE IS CONSISTENT WITH PREVIOUSLY REPORTED AND CALLED VALUE. Performed at [facility], [place] [address]  [place], [place] [address]   Hemoglobin and hematocrit, blood     Status: Abnormal   Collection Time: 18-Aug-2018  8:49 AM  Result Value Ref Range   Hemoglobin 9.3 (L) 13.0 - 17.0  g/dL   HCT 32.7 (L) 39.0 - 52.0 %    Comment: Performed at [facility], [place] [address] [place], [place] [address]  Lactic acid, plasma     Status: Abnormal   Collection Time: 2018-08-23  8:49 AM  Result Value Ref Range   Lactic Acid, Venous 2.3 (HH) 0.5 - 1.9 mmol/L    Comment: CRITICAL RESULT CALLED TO, READ BACK BY AND VERIFIED WITHBaldo Daub RN AT [id number] 08-23-18 MULLINS,T Performed at [facility], [place] [address], [place], [place] [address]      ROS:  A comprehensive review of systems was negative.  There is some confusion noted  Physical Exam: Vitals:   23-Aug-2018 1100 08-23-18 1200  BP:  (!) 115/32  Pulse:    Resp: (!) 27 (!) 29  Temp:    SpO2:       General: Thin, black male currently really in no acute distress HEENT: Pupils are equally round and reactive to light, extraocular motions are intact, mucous membranes are moist Neck: No JVD Heart: Slightly tachycardic Lungs: Mostly clear Abdomen: Slightly distended, tender with deep palpation Extremities: Minimal edema Skin: Warm and dry Neuro: Pleasantly confused  Assessment/Plan: 85 year old black male with dementia, ischemic cardiomyopathy with decreased ejection fraction and baseline CKD.  During course of hospitalization kidney function has worsened 1.Renal- baseline fairly advanced CKD.  Had consultation with nephrology as an outpatient and it was explained that patient is not a good candidate for dialysis therapy secondary to dementia, age and cardiomyopathy.  Patients family was accepting of this.  This is still the case.  Unfortunately, I cannot see anything immediately reversible, I suspect the most recent worsening of renal function is secondary to the intermittent hypotension that he has been experiencing.   I have discontinued his low-dose beta-blocker.  I told him that this will either plateau and get better or it will not.  If it does not then renal failure will likely be the cause of his demise, likely in the next few days to possibly weeks.  For now continue with conservative management/supportive care 2. Hypertension/volume  -patient does not appear to be volume overloaded.  I agree with IV fluids for now 3. Anemia  -not a significant issue at this time   Louis Meckel 2018/08/23, 2:12 PM

## 2018-08-30 NOTE — Progress Notes (Addendum)
CC:  Possible chronic cholecytitis  Subjective: Tx to ICU after ? Aspiration pneumonia, chest pain with elevated troponin.  LFT's up some after we signed off, but improving.  On exam he has no abdominal pain, brown coffee ground emesis earlier and nursing staff says he is coughing up similar fluid.   He has no abdominal pain.    Objective: Vital signs in last 24 hours: Temp:  [97.6 F (36.4 C)-97.8 F (36.6 C)] 97.6 F (36.4 C) (01/16 0409) Pulse Rate:  [82-91] 91 (01/15 2014) Resp:  [16-32] 16 (01/16 0800) BP: (83-124)/(31-76) 105/66 (01/16 0800) SpO2:  [96 %-100 %] 96 % (01/15 2014) Weight:  [84.9 kg] 84.9 kg (01/16 0350) Last BM Date: 08/13/18 1080 PO 1300 IV 525 UO BM x 1 Afebrile, BP down early AM, improving Trop 0.38 Creatinine 4.41 LFT's improving Lactate 2.0 WBC 10.8 cxr today:  Right upper lobe opacity/pneumonia  Intake/Output from previous day: 01/15 0701 - 01/16 0700 In: 2374.2 [P.O.:1080; I.V.:1294.2] Out: 525 [Urine:525] Intake/Output this shift: No intake/output data recorded.  General appearance: alert, cooperative, no distress and most comfortable I have seen him since original exam on Monday. GI: soft, non-tender; bowel sounds normal; no masses,  no organomegaly  Lab Results:  Recent Labs    08/13/18 0538 19-Aug-2018 0251  WBC 10.6* 10.8*  HGB 9.4* 9.8*  HCT 32.0* 33.1*  PLT 162 200    BMET Recent Labs    08/13/18 0538 08-19-18 0251  NA 141 138  K 4.6 4.6  CL 109 107  CO2 18* 21*  GLUCOSE 122* 166*  BUN 82* 94*  CREATININE 4.33* 4.41*  CALCIUM 8.3* 8.4*   PT/INR Recent Labs    08/11/18 1359  LABPROT 14.7  INR 1.16    Recent Labs  Lab 08/20/2018 2345 08/11/18 0500 08/12/18 1142 08/13/18 0538 08/19/2018 0251  AST 17 23 176* 72* 49*  ALT 16 18 320* 192* 135*  ALKPHOS 51 51 86 68 78  BILITOT 0.6 0.8 1.5* 0.9 1.1  PROT 6.9 6.9 6.7 5.8* 5.9*  ALBUMIN 3.5 3.7 3.1* 2.6* 2.7*     Lipase     Component Value Date/Time    LIPASE 51 08/13/2018 2345     Medications: . aspirin EC  81 mg Oral Daily  . carvedilol  6.25 mg Oral BID WC  . Chlorhexidine Gluconate Cloth  6 each Topical Q0600  . cholecalciferol  1,000 Units Oral BID  . dorzolamide-timolol  1 drop Both Eyes BID  . heparin injection (subcutaneous)  5,000 Units Subcutaneous Q8H  . latanoprost  1 drop Both Eyes QHS  .  morphine injection  2 mg Intravenous Once  . mupirocin ointment  1 application Nasal BID  . pantoprazole (PROTONIX) IV  40 mg Intravenous Q12H  . tamsulosin  0.4 mg Oral QPC supper   Prior to Admission medications   Medication Sig Start Date End Date Taking? Authorizing Provider  acetaminophen (TYLENOL) 500 MG tablet Take 500 mg by mouth at bedtime as needed for headache (pain).   Yes [provider]  aspirin EC 81 MG tablet Take 1 tablet (81 mg total) by mouth daily. If hemoglobin is stable 10/17/17  Yes Dhungel, Nishant, MD  carvedilol (COREG) 6.25 MG tablet Take 6.25 mg by mouth 2 (two) times daily with a meal.    Yes [provider]  cholecalciferol (VITAMIN D) 1000 UNITS tablet Take 1,000 Units by mouth 2 (two) times daily.   Yes [provider]  docusate  sodium (COLACE) 100 MG capsule Take 100 mg by mouth 2 (two) times daily as needed for mild constipation.   Yes [provider]  dorzolamide-timolol (COSOPT) 22.3-6.8 MG/ML ophthalmic solution Place 1 drop into both eyes 2 (two) times daily.  08/21/14  Yes [provider]  furosemide (LASIX) 40 MG tablet Take 1 tablet (40 mg total) by mouth every other day. 12/20/17  Yes Isaiah Serge, NP  latanoprost (XALATAN) 0.005 % ophthalmic solution Place 1 drop into both eyes at bedtime.   Yes [provider]  nitroGLYCERIN (NITROSTAT) 0.4 MG SL tablet Place 0.4 mg under the tongue every 5 (five) minutes as needed for chest pain (max 3 doses within 15 minutes call 911).   Yes [provider]  pantoprazole (PROTONIX) 40 MG tablet Take  1 tablet (40 mg total) by mouth 2 (two) times daily. 10/10/17 10/10/18 Yes Dhungel, Flonnie Overman, MD   Anti-infectives (From admission, onward)   Start     Dose/Rate Route Frequency Ordered Stop   09/11/2018 0345  azithromycin (ZITHROMAX) 500 mg in sodium chloride 0.9 % 250 mL IVPB  Status:  Discontinued     500 mg 250 mL/hr over 60 Minutes Intravenous Daily at bedtime 09-11-18 0334 September 11, 2018 0804   September 11, 2018 0345  cefTRIAXone (ROCEPHIN) 1 g in sodium chloride 0.9 % 100 mL IVPB  Status:  Discontinued     1 g 200 mL/hr over 30 Minutes Intravenous Daily at bedtime Sep 11, 2018 0334 September 11, 2018 0804   08/11/18 2100  piperacillin-tazobactam (ZOSYN) IVPB 2.25 g     2.25 g 100 mL/hr over 30 Minutes Intravenous Every 8 hours 08/11/18 1437 08/12/18 2119   08/11/18 1000  piperacillin-tazobactam (ZOSYN) IVPB 2.25 g  Status:  Discontinued     2.25 g 100 mL/hr over 30 Minutes Intravenous Every 8 hours 08/11/18 0237 08/11/18 1437   08/11/18 0145  piperacillin-tazobactam (ZOSYN) IVPB 3.375 g     3.375 g 100 mL/hr over 30 Minutes Intravenous  Once 08/11/18 0134 08/11/18 0239     . sodium chloride    . ampicillin-sulbactam (UNASYN) IV    . lactated ringers 75 mL/hr at 08/13/18 1843  . methocarbamol (ROBAXIN) IV    . sodium chloride 500 mL (11-Sep-2018 7989)     Assessment/Plan CAD s/p NSTEMI 09/2017 Combined CHF - EF 25-30% on ECHO 08/2017, cardiologist Dr. Tamala Julian S/p pacemaker - followed by Dr. Lovena Le AKI on CKD Urinary retention with bladder outlet obstruction - 53F Coude placed Dr. Claudette Stapler HTN  RUQ pain, nausea, vomiting Cholelithiasis - Patient with RUQ pain and gallstones, although no clear signs of acute cholecystitis on imaging. LFTs WNL. CT:  Stone in neck of GB/sludge in bile/US poor visualization due to pt abdominal pain  - HIDA results negative for cholecystitis  - AM labs pending  - High risk per Cardiology  - elevated LFT  ID -zosyn 1/13 - 1/14; Azithromycin x 1 dose; Rocephin x 1  dose VTE -SCDs/heparin FEN -IVF, NPO Foley -none    Plan:  Reviewed with DR Lucia Gaskins.  I do not think he has cholecystitis.  No pain on exam.  It will not hurt to cover with ABX, and he has orders for Unasyn.  Will follow with you, GI consult is also pending.      LOS: 1 day   Agree with above.  Alphonsa Overall, MD, [facility] Surgery Pager: [phone] Office phone:  [phone]   Earnstine Regal 11-Sep-2018 [phone]

## 2018-08-30 DEATH — deceased

## 2018-09-12 ENCOUNTER — Encounter: Payer: Medicare Other | Admitting: Internal Medicine

## 2018-09-25 ENCOUNTER — Telehealth: Payer: Self-pay | Admitting: Podiatry

## 2018-09-25 NOTE — Telephone Encounter (Signed)
pts POA called to let us know pt passed away on September 09, 2018

## 2018-10-14 ENCOUNTER — Ambulatory Visit: Payer: Medicare Other | Admitting: Interventional Cardiology

## 2018-11-05 ENCOUNTER — Ambulatory Visit: Payer: Medicare Other | Admitting: Podiatry

## 2019-06-03 IMAGING — NM NM HEPATOBILIARY IMAGE, INC GB
1 series · 6 of 6 positions shown · non-contrast
Comparison: Ultrasound right upper quadrant August 11, 2018

CLINICAL DATA: Upper abdominal pain

EXAM:
NUCLEAR MEDICINE HEPATOBILIARY IMAGING
TECHNIQUE: Sequential images of the abdomen were obtained [DATE] minutes
following intravenous administration of radiopharmaceutical.
RADIOPHARMACEUTICALS:  5.3 mCi 5c-SSm  Choletec IV

[Series 1: raw data · 4.46mm/px · 6 of 60 frames shown]
[frame 6/60]
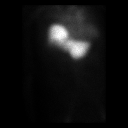
[frame 16/60]
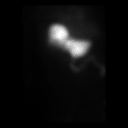
[frame 26/60]
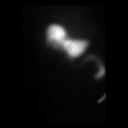
[frame 36/60]
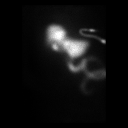
[frame 46/60]
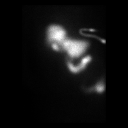
[frame 56/60]
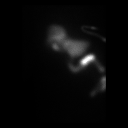

[6 of 6 positions shown; findings below may reference images not displayed]

FINDINGS: Prompt uptake and biliary excretion of activity by the liver is
seen. Gallbladder activity is visualized, consistent with patency of
cystic duct. Biliary activity passes into small bowel, consistent
with patent common bile duct. Note that there is slight delay in
visualization of the gallbladder compared to small bowel.
IMPRESSION: Patency of the cystic and common bile ducts, evidenced by
visualization of gallbladder and small bowel. Gallbladder
visualization is slightly delayed compared to visualization of small
bowel. Note that a degree of underlying chronic cholecystitis can
present in this manner.

## 2023-09-03 NOTE — Progress Notes (Signed)
RFC
